# Patient Record
Sex: Male | Born: 1950 | Race: White | Hispanic: No | Smoking: Never smoker
Health system: Southern US, Community
[De-identification: ages and names within clinical notes are randomized; demographics above are authoritative.]

## PROBLEM LIST (undated history)

## (undated) DIAGNOSIS — F329 Major depressive disorder, single episode, unspecified: Secondary | ICD-10-CM

## (undated) DIAGNOSIS — K219 Gastro-esophageal reflux disease without esophagitis: Secondary | ICD-10-CM

## (undated) DIAGNOSIS — G4733 Obstructive sleep apnea (adult) (pediatric): Secondary | ICD-10-CM

## (undated) DIAGNOSIS — E039 Hypothyroidism, unspecified: Secondary | ICD-10-CM

## (undated) DIAGNOSIS — F418 Other specified anxiety disorders: Secondary | ICD-10-CM

## (undated) DIAGNOSIS — N486 Induration penis plastica: Secondary | ICD-10-CM

## (undated) DIAGNOSIS — N312 Flaccid neuropathic bladder, not elsewhere classified: Secondary | ICD-10-CM

## (undated) DIAGNOSIS — Z9989 Dependence on other enabling machines and devices: Secondary | ICD-10-CM

## (undated) DIAGNOSIS — F32A Depression, unspecified: Secondary | ICD-10-CM

## (undated) DIAGNOSIS — M720 Palmar fascial fibromatosis [Dupuytren]: Secondary | ICD-10-CM

## (undated) DIAGNOSIS — N400 Enlarged prostate without lower urinary tract symptoms: Secondary | ICD-10-CM

## (undated) DIAGNOSIS — F419 Anxiety disorder, unspecified: Secondary | ICD-10-CM

## (undated) DIAGNOSIS — M1990 Unspecified osteoarthritis, unspecified site: Secondary | ICD-10-CM

## (undated) DIAGNOSIS — E785 Hyperlipidemia, unspecified: Secondary | ICD-10-CM

## (undated) DIAGNOSIS — T8859XA Other complications of anesthesia, initial encounter: Secondary | ICD-10-CM

## (undated) DIAGNOSIS — I219 Acute myocardial infarction, unspecified: Secondary | ICD-10-CM

## (undated) DIAGNOSIS — N529 Male erectile dysfunction, unspecified: Secondary | ICD-10-CM

## (undated) DIAGNOSIS — I2510 Atherosclerotic heart disease of native coronary artery without angina pectoris: Secondary | ICD-10-CM

## (undated) DIAGNOSIS — E0590 Thyrotoxicosis, unspecified without thyrotoxic crisis or storm: Secondary | ICD-10-CM

## (undated) DIAGNOSIS — L719 Rosacea, unspecified: Secondary | ICD-10-CM

## (undated) DIAGNOSIS — K635 Polyp of colon: Secondary | ICD-10-CM

## (undated) DIAGNOSIS — C4490 Unspecified malignant neoplasm of skin, unspecified: Secondary | ICD-10-CM

## (undated) HISTORY — PX: COLONOSCOPY WITH ESOPHAGOGASTRODUODENOSCOPY (EGD): SHX5779

## (undated) HISTORY — DX: Male erectile dysfunction, unspecified: N52.9

## (undated) HISTORY — DX: Acute myocardial infarction, unspecified: I21.9

## (undated) HISTORY — DX: Atherosclerotic heart disease of native coronary artery without angina pectoris: I25.10

## (undated) HISTORY — DX: Induration penis plastica: N48.6

## (undated) HISTORY — PX: WISDOM TOOTH EXTRACTION: SHX21

## (undated) HISTORY — PX: VASECTOMY: SHX75

## (undated) HISTORY — DX: Palmar fascial fibromatosis (dupuytren): M72.0

## (undated) HISTORY — DX: Benign prostatic hyperplasia without lower urinary tract symptoms: N40.0

## (undated) HISTORY — PX: INNER EAR SURGERY: SHX679

## (undated) HISTORY — DX: Gastro-esophageal reflux disease without esophagitis: K21.9

## (undated) HISTORY — DX: Polyp of colon: K63.5

## (undated) HISTORY — DX: Thyrotoxicosis, unspecified without thyrotoxic crisis or storm: E05.90

## (undated) HISTORY — DX: Flaccid neuropathic bladder, not elsewhere classified: N31.2

## (undated) HISTORY — DX: Dependence on other enabling machines and devices: Z99.89

## (undated) HISTORY — PX: CORONARY STENT PLACEMENT: SHX1402

## (undated) HISTORY — DX: Unspecified osteoarthritis, unspecified site: M19.90

## (undated) HISTORY — PX: PENILE PROSTHESIS IMPLANT: SHX240

## (undated) HISTORY — DX: Unspecified malignant neoplasm of skin, unspecified: C44.90

## (undated) HISTORY — DX: Rosacea, unspecified: L71.9

## (undated) HISTORY — DX: Anxiety disorder, unspecified: F41.9

## (undated) HISTORY — DX: Obstructive sleep apnea (adult) (pediatric): G47.33

## (undated) HISTORY — PX: TONSILECTOMY, ADENOIDECTOMY, BILATERAL MYRINGOTOMY AND TUBES: SHX2538

## (undated) HISTORY — DX: Other specified anxiety disorders: F41.8

## (undated) HISTORY — DX: Depression, unspecified: F32.A

## (undated) HISTORY — DX: Hyperlipidemia, unspecified: E78.5

## (undated) HISTORY — PX: TONSILLECTOMY: SUR1361

## (undated) HISTORY — DX: Major depressive disorder, single episode, unspecified: F32.9

---

## 1998-02-03 HISTORY — PX: LASIK: SHX215

## 1999-11-25 ENCOUNTER — Ambulatory Visit (HOSPITAL_COMMUNITY): Admission: RE | Admit: 1999-11-25 | Discharge: 1999-11-25 | Payer: Self-pay | Admitting: Gastroenterology

## 2002-10-12 ENCOUNTER — Ambulatory Visit (HOSPITAL_COMMUNITY): Admission: RE | Admit: 2002-10-12 | Discharge: 2002-10-12 | Payer: Self-pay | Admitting: Gastroenterology

## 2010-03-06 ENCOUNTER — Ambulatory Visit (HOSPITAL_COMMUNITY): Admission: RE | Admit: 2010-03-06 | Discharge: 2010-03-06 | Payer: Self-pay | Admitting: Orthopedic Surgery

## 2010-10-15 ENCOUNTER — Inpatient Hospital Stay (HOSPITAL_COMMUNITY): Admission: RE | Admit: 2010-10-15 | Discharge: 2010-10-18 | Payer: Self-pay | Admitting: Cardiovascular Disease

## 2010-10-15 ENCOUNTER — Ambulatory Visit: Payer: Self-pay | Admitting: Internal Medicine

## 2010-10-26 DIAGNOSIS — H409 Unspecified glaucoma: Secondary | ICD-10-CM | POA: Insufficient documentation

## 2010-10-26 DIAGNOSIS — F341 Dysthymic disorder: Secondary | ICD-10-CM | POA: Insufficient documentation

## 2010-10-26 DIAGNOSIS — E785 Hyperlipidemia, unspecified: Secondary | ICD-10-CM | POA: Insufficient documentation

## 2010-10-26 DIAGNOSIS — I495 Sick sinus syndrome: Secondary | ICD-10-CM | POA: Insufficient documentation

## 2010-10-26 DIAGNOSIS — I2510 Atherosclerotic heart disease of native coronary artery without angina pectoris: Secondary | ICD-10-CM | POA: Insufficient documentation

## 2010-10-26 DIAGNOSIS — I2119 ST elevation (STEMI) myocardial infarction involving other coronary artery of inferior wall: Secondary | ICD-10-CM | POA: Insufficient documentation

## 2010-10-30 ENCOUNTER — Encounter: Payer: Self-pay | Admitting: Cardiovascular Disease

## 2010-11-03 ENCOUNTER — Encounter: Payer: Self-pay | Admitting: Cardiovascular Disease

## 2010-11-03 ENCOUNTER — Ambulatory Visit: Payer: Self-pay | Admitting: Cardiovascular Disease

## 2010-12-26 ENCOUNTER — Encounter: Payer: Self-pay | Admitting: Emergency Medicine

## 2010-12-31 ENCOUNTER — Telehealth: Payer: Self-pay | Admitting: Cardiovascular Disease

## 2011-01-06 ENCOUNTER — Ambulatory Visit: Admit: 2011-01-06 | Payer: Self-pay

## 2011-01-06 ENCOUNTER — Other Ambulatory Visit: Payer: Self-pay | Admitting: Cardiovascular Disease

## 2011-01-06 ENCOUNTER — Other Ambulatory Visit (INDEPENDENT_AMBULATORY_CARE_PROVIDER_SITE_OTHER): Payer: 59

## 2011-01-06 ENCOUNTER — Encounter: Payer: Self-pay | Admitting: Cardiovascular Disease

## 2011-01-06 ENCOUNTER — Ambulatory Visit (HOSPITAL_COMMUNITY): Payer: 59 | Attending: Cardiovascular Disease

## 2011-01-06 DIAGNOSIS — I252 Old myocardial infarction: Secondary | ICD-10-CM | POA: Insufficient documentation

## 2011-01-06 DIAGNOSIS — E785 Hyperlipidemia, unspecified: Secondary | ICD-10-CM

## 2011-01-06 DIAGNOSIS — I2510 Atherosclerotic heart disease of native coronary artery without angina pectoris: Secondary | ICD-10-CM | POA: Insufficient documentation

## 2011-01-06 DIAGNOSIS — I059 Rheumatic mitral valve disease, unspecified: Secondary | ICD-10-CM | POA: Insufficient documentation

## 2011-01-06 LAB — HEPATIC FUNCTION PANEL
ALT: 28 U/L (ref 0–53)
AST: 32 U/L (ref 0–37)
Alkaline Phosphatase: 70 U/L (ref 39–117)
Bilirubin, Direct: 0.3 mg/dL (ref 0.0–0.3)
Total Bilirubin: 1.1 mg/dL (ref 0.3–1.2)

## 2011-01-06 LAB — LIPID PANEL
LDL Cholesterol: 61 mg/dL (ref 0–99)
Total CHOL/HDL Ratio: 3
Triglycerides: 50 mg/dL (ref 0.0–149.0)

## 2011-01-07 NOTE — Assessment & Plan Note (Signed)
Summary: eph/wpa   Visit Type:  EPH Primary Deysy Schabel:  Merri Brunette  CC:  pt c/o arm and chest pain but not as bad as when he had the MI....states he has back pain now...denies any other complaints today...c/o muscle spasms in his back today....  History of Present Illness: 60 yo WM with history of borderline hyperlipidemia and diagnosis of CAD at time of presentation with acute anterolateral STEMI on 10/15/10. He  underwent emergent catheterization and was found to have an occluded large first obtuse marginal branch of the Circumflex artery. A 2.5 x 15 mm Promus DES was placed in the OM branch and post-dilated with a 2.75 mm non-compliant balloon. He did well following his MI and was discharged home on 10/18/10.   He is here today for follow up. He has been doing well. He describes central chest pain that is mild and only rare. Not similar to pain before MI. His breathing has been ok. He does describe fatigue. Overall he feels great.    Current Medications (verified): 1)  Aspirin Ec 325 Mg Tbec (Aspirin) .... Take One Tablet By Mouth Daily 2)  Lipitor 40 Mg Tabs (Atorvastatin Calcium) .Marland Kitchen.. 1 Tab At Bedtime 3)  Effient 10 Mg Tabs (Prasugrel Hcl) .Marland Kitchen.. 1 Tab Once Daily 4)  Metoprolol Succinate 25 Mg Xr24h-Tab (Metoprolol Succinate) .Marland Kitchen.. 1 Tab Once Daily 5)  Calcium-Vitamin D 500-200 Mg-Unit Tabs (Calcium-Vitamin D) .Marland Kitchen.. 1 Tabs Qam 6)  Effexor Xr 75 Mg Xr24h-Cap (Venlafaxine Hcl) .... 2 Caps Qam...1 Cap At Bedtime 7)  Fish Oil 1200 Mg Caps (Omega-3 Fatty Acids) .Marland Kitchen.. 1 Cap Two Times A Day 8)  Multivitamins   Tabs (Multiple Vitamin) .... 1/2 Tab Once Daily 9)  Timolol Maleate 0.5 % Soln (Timolol Maleate) .Marland Kitchen.. 1 Gtt Ou At Bedtime 10)  Omeprazole 10 Mg Cpdr (Omeprazole) .Marland Kitchen.. 1 Tab Once Daily 11)  Magnesium Oxide 400 Mg Tabs (Magnesium Oxide) .Marland Kitchen.. 1 Tab Once Daily  Allergies (verified): 1)  ! Sulfa 2)  ! * Ivp Dye  Past History:  Past Medical History: CAD s/p inferolateral STEMI 10/15/10  with Promus DES placed x 1 in the first OM HYPERLIPIDEMIA  DEPRESSION/ANXIETY GLAUCOMA  GERD BPH Obstructive sleep apnea-uses CPAP at night  Past Surgical History: Vasectomy Tonsillectomy  Family History: Reviewed history from 10/26/2010 and no changes required. Mother-deceased, smoker, COPD Father: alive, healthy Sister: 2 alive and healthy NO premature CAD  Social History: Reviewed history from 10/26/2010 and no changes required. He has no history of tobacco use.   He has no history of  alcohol.  No illicit drug use Married, 2 children (2 boys) He works as a Horticulturist, commercial  Review of Systems       The patient complains of fatigue and chest pain.  The patient denies malaise, fever, weight gain/loss, vision loss, decreased hearing, hoarseness, palpitations, shortness of breath, prolonged cough, wheezing, sleep apnea, coughing up blood, abdominal pain, blood in stool, nausea, vomiting, diarrhea, heartburn, incontinence, blood in urine, muscle weakness, joint pain, leg swelling, rash, skin lesions, headache, fainting, dizziness, depression, anxiety, enlarged lymph nodes, easy bruising or bleeding, and environmental allergies.    Vital Signs:  Patient profile:   60 year old male Height:      74 inches Weight:      231.50 pounds BMI:     29.83 Pulse rate:   52 / minute Pulse rhythm:   irregular BP sitting:   92 / 70  (left arm) Cuff size:   large  Vitals Entered By: Danielle Rankin, CMA (November 03, 2010 8:34 AM)  Physical Exam  General:  General: Well developed, well nourished, NAD HEENT: OP clear, mucus membranes moist SKIN: warm, dry Neuro: No focal deficits Musculoskeletal: Muscle strength 5/5 all ext Psychiatric: Mood and affect normal Neck: No JVD, no carotid bruits, no thyromegaly, no lymphadenopathy. Lungs:Clear bilaterally, no wheezes, rhonci, crackles CV: RRR no murmurs, gallops rubs Abdomen: soft, NT, ND, BS present Extremities: No edema,  pulses 2+.    Cardiac Cath  Procedure date:  10/15/2010  Findings:       1. The left main coronary artery had no evidence of disease.   2. The left anterior descending was a large vessel that coursed to the       apex and wrapped around the apex.  There was diffuse 40% stenosis       throughout the proximal portion of the LAD.  There was a very small-       caliber diagonal branch that had an ostial 90% stenosis.   3. There was a moderate-sized bifurcating ramus intermediate branch       that had diffuse 40% plaque throughout the proximal portion of the       vessel.   4. Circumflex artery was a large-caliber vessel that gave off a       moderate-sized bifurcating obtuse marginal branch.  The obtuse       marginal branch was totally occluded on our initial injections.       The AV groove circumflex was a moderate-sized vessel with no       obstructive lesions.   5. The right coronary artery was a large dominant vessel with diffuse       40% stenosis throughout the midportion of the vessel.   6. Left ventricular angiogram was performed in the RAO projection and       showed preserved left ventricular systolic function with ejection       fraction of 50-55%.   EKG  Procedure date:  11/03/2010  Findings:      Sinus brady, rate 52 bpm.   Impression & Recommendations:  Problem # 1:  CAD, NATIVE VESSEL (ICD-414.01) Stable post recent STEMI with occluded obtuse marginal branch of the circumflex. Promus DES placed. Will need at least one year of dual antiplatelet therapy with ASA and Effient. Continue statin. His blood pressure is too low to tolerate an Ace-inhibitor. Will decrease Toprol to 12.5 mg by mouth Qdaily. Will check echo in 10 weeks. He will call us if he continues to feel fatigued and we may have to stop his Toprol completely. He can return to full work and exercise.   His updated medication list for this problem includes:    Aspirin Ec 325 Mg Tbec (Aspirin) .Marland Kitchen... Take  one tablet by mouth daily    Effient 10 Mg Tabs (Prasugrel hcl) .Marland Kitchen... 1 tab once daily    Metoprolol Succinate 25 Mg Xr24h-tab (Metoprolol succinate) .Marland Kitchen... 1/2 tablet by mouth daily.    Nitrostat 0.4 Mg Subl (Nitroglycerin) .Marland Kitchen... 1 tablet under tongue at onset of chest pain; you may repeat every 5 minutes for up to 3 doses.  Orders: EKG w/ Interpretation (93000) Echocardiogram (Echo)  Problem # 2:  MYOCARDIAL INFARCTION, ACUTE, INFEROLATERAL (ICD-410.20) See above.   His updated medication list for this problem includes:    Aspirin Ec 325 Mg Tbec (Aspirin) .Marland Kitchen... Take one tablet by mouth daily    Effient 10 Mg Tabs (Prasugrel hcl) .Marland KitchenMarland KitchenMarland KitchenMarland Kitchen  1 tab once daily    Metoprolol Succinate 25 Mg Xr24h-tab (Metoprolol succinate) .Marland Kitchen... 1/2 tablet by mouth daily.    Nitrostat 0.4 Mg Subl (Nitroglycerin) .Marland Kitchen... 1 tablet under tongue at onset of chest pain; you may repeat every 5 minutes for up to 3 doses.  Orders: Echocardiogram (Echo)  Problem # 3:  HYPERLIPIDEMIA (ICD-272.4) Continue statin. Will check fasting lipids and LFTs in 10 weeks.   His updated medication list for this problem includes:    Lipitor 40 Mg Tabs (Atorvastatin calcium) .Marland Kitchen... 1 tab at bedtime  Patient Instructions: 1)  Your physician recommends that you schedule a follow-up appointment in: 6 months 2)  Your physician has recommended you make the following change in your medication: DECREASE TOPROL to 12.5mg  by mouth daily. USE NITROGLYCERINE under your tongue as needed for chest pain. 3)  Your physician has requested that you have an echocardiogram.  Echocardiography is a painless test that uses sound waves to create images of your heart. It provides your doctor with information about the size and shape of your heart and how well your heart's chambers and valves are working.  This procedure takes approximately one hour. There are no restrictions for this procedure. 4)  Your physician recommends that you return for a FASTING lipid  profile and liver function test in 12 weeks.  Prescriptions: NITROSTAT 0.4 MG SUBL (NITROGLYCERIN) 1 tablet under tongue at onset of chest pain; you may repeat every 5 minutes for up to 3 doses.  #25 x 3   Entered by:   Whitney Maeola Sarah RN   Authorized by:   Verne Carrow, MD   Signed by:   Ellender Hose RN on 11/03/2010   Method used:   Electronically to        [id number] (retail)       [address]       [place], [place]  [address]       Ph: [phone]       Fax: [phone]   RxID:   [id number] METOPROLOL SUCCINATE 25 MG XR24H-TAB (METOPROLOL SUCCINATE) 1/2 tablet by mouth daily.  #30 x 8   Entered by:   Whitney Maeola Sarah RN   Authorized by:   Verne Carrow, MD   Signed by:   Ellender Hose RN on 11/03/2010   Method used:   Electronically to        [id number] (retail)       [address]       [place], [place]  [address]       Ph: [phone]       Fax: [phone]   RxID:   [id number]

## 2011-01-07 NOTE — Progress Notes (Signed)
Summary: Dietary Supplments/ Med List  Dietary Supplments/ Med List   Imported By: Earl Many 11/06/2010 16:30:16  _____________________________________________________________________  External Attachment:    Type:   Image     Comment:   External Document

## 2011-01-07 NOTE — Progress Notes (Signed)
Summary: pt's metoprolol naking him feel bad/ LMOM for call back.  Phone Note Call from Patient   Caller: Patient [phone] Reason for Call: Talk to Nurse Summary of Call: metoprolol making him feel bad-pls call Initial call taken by: Glynda Jaeger,  December 31, 2010 1:33 PM  Follow-up for Phone Call        [facility] for call back.  Layne Benton, RN, BSN  December 31, 2010 2:21 PM   Additional Follow-up for Phone Call Additional follow up Details #1::        Patient called back. He can't tolerate the 12.5mg  of metoprolol because of profound weakness. He will cut this dose in half for a few days and then stop. He has follow up with Dr.CM next week. Will let him know.  Layne Benton, RN, BSN  December 31, 2010 3:25 PM

## 2011-01-13 NOTE — Miscellaneous (Signed)
Summary: Orders Update  Clinical Lists Changes  Orders: Added new Test order of TLB-Hepatic/Liver Function Pnl (80076-HEPATIC) - Signed Added new Test order of TLB-Lipid Panel (80061-LIPID) - Signed 

## 2011-02-16 LAB — PROTIME-INR: INR: 1.01 (ref 0.00–1.49)

## 2011-02-16 LAB — CBC
Hemoglobin: 13.7 g/dL (ref 13.0–17.0)
MCH: 34 pg (ref 26.0–34.0)
MCV: 98.2 fL (ref 78.0–100.0)
Platelets: 149 10*3/uL — ABNORMAL LOW (ref 150–400)
Platelets: 163 10*3/uL (ref 150–400)
RBC: 3.85 MIL/uL — ABNORMAL LOW (ref 4.22–5.81)
RBC: 4.18 MIL/uL — ABNORMAL LOW (ref 4.22–5.81)

## 2011-02-16 LAB — POCT I-STAT, CHEM 8
BUN: 19 mg/dL (ref 6–23)
Calcium, Ion: 0.99 mmol/L — ABNORMAL LOW (ref 1.12–1.32)
Chloride: 105 mEq/L (ref 96–112)
Creatinine, Ser: 1.3 mg/dL (ref 0.4–1.5)
Glucose, Bld: 143 mg/dL — ABNORMAL HIGH (ref 70–99)
HCT: 48 % (ref 39.0–52.0)
Hemoglobin: 16.3 g/dL (ref 13.0–17.0)
Potassium: 4 mEq/L (ref 3.5–5.1)
Sodium: 138 meq/L (ref 135–145)
TCO2: 26 mmol/L (ref 0–100)

## 2011-02-16 LAB — BASIC METABOLIC PANEL
Calcium: 8.9 mg/dL (ref 8.4–10.5)
GFR calc Af Amer: >60 mL/min (ref >60)
GFR calc non Af Amer: 59 mL/min — ABNORMAL LOW (ref >60)
Sodium: 141 mEq/L (ref 135–145)

## 2011-02-16 LAB — COMPREHENSIVE METABOLIC PANEL
ALT: 41 U/L (ref 0–53)
AST: 43 U/L — ABNORMAL HIGH (ref 0–37)
AST: 163 U/L — ABNORMAL HIGH (ref 0–37)
Albumin: 4.1 g/dL (ref 3.5–5.2)
Alkaline Phosphatase: 78 U/L (ref 39–117)
Alkaline Phosphatase: 59 U/L (ref 39–117)
BUN: 19 mg/dL (ref 6–23)
CO2: 28 mEq/L (ref 19–32)
Chloride: 105 mEq/L (ref 96–112)
Creatinine, Ser: 1.07 mg/dL (ref 0.4–1.5)
GFR calc Af Amer: >60 mL/min (ref >60)
GFR calc Af Amer: >60 mL/min (ref >60)
GFR calc non Af Amer: >60 mL/min (ref >60)
Potassium: 4 mEq/L (ref 3.5–5.1)
Sodium: 138 mEq/L (ref 135–145)
Total Bilirubin: 0.9 mg/dL (ref 0.3–1.2)
Total Protein: 7.4 g/dL (ref 6.0–8.3)

## 2011-02-16 LAB — LIPID PANEL
Cholesterol: 172 mg/dL (ref 0–200)
Total CHOL/HDL Ratio: 4.4 RATIO
VLDL: 22 mg/dL (ref 0–40)

## 2011-02-16 LAB — POCT CARDIAC MARKERS
CKMB, poc: 25.6 ng/mL (ref 1.0–8.0)
Myoglobin, poc: >500 ng/mL (ref 12–200)
Troponin i, poc: 0.53 ng/mL (ref 0.00–0.09)

## 2011-02-16 LAB — TROPONIN I: Troponin I: 69.21 ng/mL (ref 0.00–0.06)

## 2011-04-23 NOTE — Op Note (Signed)
   NAME:  Matthew Pratt, Matthew Pratt                          ACCOUNT NO.:  [id number]   MEDICAL RECORD NO.:  [id number]                   PATIENT TYPE:  AMB   LOCATION:  ENDO                                 FACILITY:  [facility]   PHYSICIAN:  John C. Madilyn Fireman, M.D.                 DATE OF BIRTH:  09-05-1951   DATE OF PROCEDURE:  10/12/2002  DATE OF DISCHARGE:                                 OPERATIVE REPORT   PROCEDURE:  Colonoscopy.   INDICATIONS FOR PROCEDURE:  Family history of colon cancer in two second  degree relatives.   DESCRIPTION OF PROCEDURE:  The patient was placed in the left lateral  decubitus position then placed on the pulse monitor with continuous low flow  oxygen delivered by nasal cannula. He was sedated with 50 mg IV Demerol and  6 mg IV Versed. The Olympus video colonoscope was inserted into the rectum  and advanced to the cecum, confirmed by transillumination at McBurney's  point and visualization of the ileocecal valve and appendiceal orifice. The  prep was excellent. The cecum, ascending, transverse, descending and sigmoid  colon all appeared normal with no masses, polyps, diverticula or other  mucosal abnormalities. The rectum likewise appeared normal and retroflexed  view of the anus revealed no obvious internal hemorrhoids. The colonoscope  was then withdrawn and the patient returned to the recovery room in stable  condition. The patient tolerated the procedure well and there were no  immediate complications.   IMPRESSION:  Normal colonoscopy.   PLAN:  Will repeat colonoscopy in five years.                                                John C. Madilyn Fireman, M.D.    [facility]/MEDQ  D:  10/12/2002  T:  10/12/2002  Job:  [id number]   cc:   Heather Roberts, M.D.  [address]  [place]  [place] [address]  Fax: [phone]

## 2011-04-28 ENCOUNTER — Encounter: Payer: Self-pay | Admitting: Cardiovascular Disease

## 2011-04-29 ENCOUNTER — Encounter: Payer: Self-pay | Admitting: Cardiovascular Disease

## 2011-04-29 ENCOUNTER — Ambulatory Visit (INDEPENDENT_AMBULATORY_CARE_PROVIDER_SITE_OTHER): Payer: 59 | Admitting: Cardiovascular Disease

## 2011-04-29 VITALS — BP 110/72 | HR 68 | Resp 17 | Ht 74.0 in | Wt 210.0 lb

## 2011-04-29 DIAGNOSIS — I2510 Atherosclerotic heart disease of native coronary artery without angina pectoris: Secondary | ICD-10-CM

## 2011-04-29 MED ORDER — ASPIRIN 81 MG PO TABS: 81.0000 mg | ORAL_TABLET | Freq: Every day | ORAL | Status: DC

## 2011-04-29 NOTE — Progress Notes (Signed)
Addended by: Floreen Comber on: 04/29/2011 01:46 PM   Modules accepted: Orders

## 2011-04-29 NOTE — Progress Notes (Signed)
History of Present Illness:60 yo WM with history of borderline hyperlipidemia and diagnosis of CAD at time of presentation with acute anterolateral STEMI on 10/15/10. He  underwent emergent catheterization and was found to have an occluded large first obtuse marginal branch of the Circumflex artery. A 2.5 x 15 mm Promus DES was placed in the OM branch and post-dilated with a 2.75 mm non-compliant balloon. He did well following his MI and was discharged home on 10/18/10. I last saw him in November.   He is here today for follow up. He tells me that he has done well for the last six months. Yesterday he had an episode of central chest pressure at rest, tightness, he took a NTG and this lasted for 20 minutes. After a meal. No associated SOB, diaphoresis, N/V. He has been exercising every day with no CP or SOB.   Past Medical History  Diagnosis Date  . CAD (coronary artery disease)     s/p STEMI 10/15/10 w/ Promus DES placed x1 in the first OM  . HLD (hyperlipidemia)   . Depression with anxiety   . Glaucoma   . GERD (gastroesophageal reflux disease)   . BPH (benign prostatic hypertrophy)   . OSA on CPAP     Past Surgical History  Procedure Date  . Vasectomy   . Tonsillectomy     Current Outpatient Prescriptions  Medication Sig Dispense Refill  . aspirin 325 MG tablet Take 325 mg by mouth daily.        Marland Kitchen atorvastatin (LIPITOR) 40 MG tablet Take 40 mg by mouth daily.        . Bimatoprost (LUMIGAN) 0.01 % SOLN Apply 1 drop to eye daily.        . Calcium Carbonate-Vitamin D (CALCIUM-VITAMIN D) 500-200 MG-UNIT per tablet Take 1 tablet by mouth daily.        . magnesium oxide (MAG-OX) 400 MG tablet Take 400 mg by mouth daily.        . Misc Natural Products (FIBER 7 PO) Take by mouth.        . Multiple Vitamin (MULTIVITAMIN) tablet Take 1 tablet by mouth daily.        . nitroGLYCERIN (NITROSTAT) 0.4 MG SL tablet Place 0.4 mg under the tongue every 5 (five) minutes as needed.        . Omega-3  Fatty Acids (FISH OIL) 1200 MG CAPS Take by mouth 2 (two) times daily.        . prasugrel (EFFIENT) 10 MG TABS Take 10 mg by mouth daily.        Marland Kitchen venlafaxine (EFFEXOR-XR) 75 MG 24 hr capsule Take 75 mg by mouth daily.        Marland Kitchen DISCONTD: omeprazole (PRILOSEC) 10 MG capsule Take 10 mg by mouth daily.        Marland Kitchen DISCONTD: timolol (BETIMOL) 0.5 % ophthalmic solution Place 1 drop into both eyes at bedtime.          Allergies  Allergen Reactions  . Sulfonamide Derivatives     REACTION: rash    History   Social History  . Marital Status: Married    Spouse Name: N/A    Number of Children: N/A  . Years of Education: N/A   Occupational History  . technical specialist -  engineer    Social History Main Topics  . Smoking status: Never Smoker   . Smokeless tobacco: Not on file  . Alcohol Use: No  . Drug Use: No  .  Sexually Active: Not on file   Other Topics Concern  . Not on file   Social History Narrative  . No narrative on file    Family History  Problem Relation Age of Onset  . COPD      Review of Systems:  As stated in the HPI and otherwise negative.   BP 110/72  Pulse 68  Resp 17  Ht 6\' 2"  (1.88 m)  Wt 210 lb (95.255 kg)  BMI 26.96 kg/m2  Physical Examination: General: Well developed, well nourished, NAD HEENT: OP clear, mucus membranes moist SKIN: warm, dry. No rashes. Neuro: No focal deficits Musculoskeletal: Muscle strength 5/5 all ext Psychiatric: Mood and affect normal Neck: No JVD, no carotid bruits, no thyromegaly, no lymphadenopathy. Lungs:Clear bilaterally, no wheezes, rhonci, crackles Cardiovascular: Regular rate and rhythm. No murmurs, gallops or rubs. Abdomen:Soft. Bowel sounds present. Non-tender.  Extremities: No lower extremity edema. Pulses are 2 + in the bilateral DP/PT.  EKG: Sinus brady, rate 59 bpm.

## 2011-04-29 NOTE — Assessment & Plan Note (Signed)
Stable. I do not think his episode of chest pain is cardiac related. Reduce ASA to 81 mg po Qdaily. Continue Effient 10 mg po Qdaily. Continue beta blocker. He will hold statin for one month to see if it helps his constipation. If no help, will resume.

## 2011-09-03 ENCOUNTER — Other Ambulatory Visit: Payer: Self-pay | Admitting: *Deleted

## 2011-09-03 DIAGNOSIS — I2510 Atherosclerotic heart disease of native coronary artery without angina pectoris: Secondary | ICD-10-CM

## 2011-09-03 MED ORDER — ATORVASTATIN CALCIUM 40 MG PO TABS: 40.0000 mg | ORAL_TABLET | Freq: Every day | ORAL | Status: DC

## 2011-09-13 ENCOUNTER — Telehealth: Payer: Self-pay | Admitting: Cardiovascular Disease
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
## 2011-10-08 ENCOUNTER — Other Ambulatory Visit: Payer: Self-pay | Admitting: *Deleted

## 2011-10-08 MED ORDER — PRASUGREL HCL 10 MG PO TABS: 10.0000 mg | ORAL_TABLET | Freq: Every day | ORAL | Status: DC

## 2011-10-27 ENCOUNTER — Ambulatory Visit (INDEPENDENT_AMBULATORY_CARE_PROVIDER_SITE_OTHER): Payer: 59 | Admitting: Cardiovascular Disease

## 2011-10-27 ENCOUNTER — Encounter: Payer: Self-pay | Admitting: Cardiovascular Disease

## 2011-10-27 VITALS — BP 100/60 | HR 60 | Ht 74.0 in | Wt 208.0 lb

## 2011-10-27 DIAGNOSIS — I2510 Atherosclerotic heart disease of native coronary artery without angina pectoris: Secondary | ICD-10-CM

## 2011-10-27 MED ORDER — ATORVASTATIN CALCIUM 40 MG PO TABS: 40.0000 mg | ORAL_TABLET | Freq: Every day | ORAL | Status: DC

## 2011-10-27 MED ORDER — CLOPIDOGREL BISULFATE 75 MG PO TABS: 75.0000 mg | ORAL_TABLET | Freq: Every day | ORAL | Status: DC

## 2011-10-27 MED ORDER — ASPIRIN EC 81 MG PO TBEC: 81.0000 mg | DELAYED_RELEASE_TABLET | Freq: Every day | ORAL | Status: AC

## 2011-10-27 NOTE — Progress Notes (Signed)
History of Present Illness: 60 yo WM with history of borderline hyperlipidemia and diagnosis of CAD at time of presentation with acute anterolateral STEMI on 10/15/10. He underwent emergent catheterization and was found to have an occluded large first obtuse marginal branch of the Circumflex artery. A 2.5 x 15 mm Promus DES was placed in the OM branch and post-dilated with a 2.75 mm non-compliant balloon. He did well following his MI and was discharged home on 10/18/10. I last saw him in May 2012.   He is here today for follow up. He tells me that he has done well for the last six months. He has been exercising every morning by walking two miles with no CP or SOB. Lipids in February 2012 with total cholesterol 110, LDL 61, HDL 39. LFTs normal 2/12.    Past Medical History  Diagnosis Date  . CAD (coronary artery disease)     s/p STEMI 10/15/10 w/ Promus DES placed x1 in the first OM  . HLD (hyperlipidemia)   . Depression with anxiety   . Glaucoma   . GERD (gastroesophageal reflux disease)   . BPH (benign prostatic hypertrophy)   . OSA on CPAP     Past Surgical History  Procedure Date  . Vasectomy   . Tonsillectomy     Current Outpatient Prescriptions  Medication Sig Dispense Refill  . atorvastatin (LIPITOR) 40 MG tablet Take 1 tablet (40 mg total) by mouth daily.  30 tablet  6  . Calcium Carbonate-Vitamin D (CALCIUM-VITAMIN D) 500-200 MG-UNIT per tablet Take 1 tablet by mouth daily.        . Misc Natural Products (FIBER 7 PO) Take by mouth.        . Multiple Vitamin (MULTIVITAMIN) tablet Take 1 tablet by mouth daily.        . nitroGLYCERIN (NITROSTAT) 0.4 MG SL tablet Place 0.4 mg under the tongue every 5 (five) minutes as needed.        . Omega-3 Fatty Acids (FISH OIL) 1200 MG CAPS Take by mouth 2 (two) times daily.        . prasugrel (EFFIENT) 10 MG TABS Take 1 tablet (10 mg total) by mouth daily.  30 tablet  1  . timolol (TIMOPTIC) 0.5 % ophthalmic solution 1 drop 1 day or 1 dose.         . venlafaxine (EFFEXOR-XR) 75 MG 24 hr capsule Take 150 mg by mouth 2 (two) times daily.         Allergies  Allergen Reactions  . Sulfonamide Derivatives     REACTION: rash    History   Social History  . Marital Status: Married    Spouse Name: N/A    Number of Children: N/A  . Years of Education: N/A   Occupational History  . technical specialist -  engineer    Social History Main Topics  . Smoking status: Never Smoker   . Smokeless tobacco: Not on file  . Alcohol Use: No  . Drug Use: No  . Sexually Active: Not on file   Other Topics Concern  . Not on file   Social History Narrative  . No narrative on file    Family History  Problem Relation Age of Onset  . COPD      Review of Systems:  As stated in the HPI and otherwise negative.   BP 100/60  Pulse 60  Ht 6\' 2"  (1.88 m)  Wt 208 lb (94.348 kg)  BMI 26.71 kg/m2  Physical Examination: General: Well developed, well nourished, NAD HEENT: OP clear, mucus membranes moist SKIN: warm, dry. No rashes. Neuro: No focal deficits Musculoskeletal: Muscle strength 5/5 all ext Psychiatric: Mood and affect normal Neck: No JVD, no carotid bruits, no thyromegaly, no lymphadenopathy. Lungs:Clear bilaterally, no wheezes, rhonci, crackles Cardiovascular: Regular rate and rhythm. No murmurs, gallops or rubs. Abdomen:Soft. Bowel sounds present. Non-tender.  Extremities: No lower extremity edema. Pulses are 2 + in the bilateral DP/PT.

## 2011-10-27 NOTE — Assessment & Plan Note (Addendum)
Stable. He stopped his ASA. He has been on Effient. He is now one year post DES and STEMI. Will change to Plavix 75 mg po Qdaily and stop Effient. He will restart ASA 81 mg po Qdaily. Continue statin. Repeat lipids and LFTs in 6 months. He is not on a beta blocker secondary to bradycardia.

## 2011-10-27 NOTE — Patient Instructions (Signed)
Your physician wants you to follow-up in: 6 months.  You will receive a reminder letter in the mail two months in advance. If you don't receive a letter, please call our office to schedule the follow-up appointment.  Your physician has recommended you make the following change in your medication:  Stop Effient.  Start Clopidogrel 75 mg by mouth daily. Start aspirin 81 mg by mouth daily   Your physician recommends that you return for fasting  lab work in 6 months on day of appt with Dr. Larene Beach and Liver profile.

## 2012-04-25 ENCOUNTER — Ambulatory Visit (INDEPENDENT_AMBULATORY_CARE_PROVIDER_SITE_OTHER): Payer: 59 | Admitting: Cardiovascular Disease

## 2012-04-25 ENCOUNTER — Encounter: Payer: Self-pay | Admitting: Cardiovascular Disease

## 2012-04-25 VITALS — BP 104/80 | HR 57 | Ht 74.0 in | Wt 215.1 lb

## 2012-04-25 DIAGNOSIS — I2510 Atherosclerotic heart disease of native coronary artery without angina pectoris: Secondary | ICD-10-CM

## 2012-04-25 MED ORDER — ATORVASTATIN CALCIUM 10 MG PO TABS: 10.0000 mg | ORAL_TABLET | Freq: Every day | ORAL | Status: DC

## 2012-04-25 NOTE — Patient Instructions (Signed)
Your physician wants you to follow-up in: 12 months.  You will receive a reminder letter in the mail two months in advance. If you don't receive a letter, please call our office to schedule the follow-up appointment.  Your physician has recommended you make the following change in your medication:  Stop Plavix   

## 2012-04-25 NOTE — Assessment & Plan Note (Signed)
Stable. Will continue ASA. Will stop Plavix. Continue Lipitor 10 mg po QHS. He is doing well.

## 2012-04-25 NOTE — Progress Notes (Signed)
History of Present Illness: 61 yo Pratt with history of HLD and CAD who is here today for cardiac follow up. He presented with an acute anterolateral STEMI on 10/15/10. He underwent emergent catheterization and was found to have an occluded large first obtuse marginal branch of the Circumflex artery. A 2.5 x 15 mm Promus DES was placed in the OM branch and post-dilated with a 2.75 mm non-compliant balloon. He did well following his MI and was discharged home on 10/18/10. I last saw him in November 2012.   He is here today for follow up. He tells me that he has done well for the last six months. He has been exercising every morning by walking two miles with no CP or SOB. No dizziness, near syncope or syncope.    Primary Care Physician: Merri Brunette  Last Lipid Profile:  Followed by Dr. Renne Crigler. Well controlled per pt.  Lipid Panel     Component Value Date/Time   CHOL 110 01/06/2011 0900   TRIG 50.0 01/06/2011 0900   HDL 38.90* 01/06/2011 0900   CHOLHDL 3 01/06/2011 0900   VLDL 10.0 01/06/2011 0900   LDLCALC 61 01/06/2011 0900     Past Medical History  Diagnosis Date  . CAD (coronary artery disease)     s/p STEMI 10/15/10 w/ Promus DES placed x1 in the first OM  . HLD (hyperlipidemia)   . Depression with anxiety   . Glaucoma   . GERD (gastroesophageal reflux disease)   . BPH (benign prostatic hypertrophy)   . OSA on CPAP     Past Surgical History  Procedure Date  . Vasectomy   . Tonsillectomy     Current Outpatient Prescriptions  Medication Sig Dispense Refill  . aspirin EC 81 MG tablet Take 1 tablet (81 mg total) by mouth daily.  150 tablet  2  . atorvastatin (LIPITOR) 40 MG tablet Take 1 tablet (40 mg total) by mouth daily.  90 tablet  3  . Calcium Carbonate-Vitamin D (CALCIUM-VITAMIN D) 500-200 MG-UNIT per tablet Take 1 tablet by mouth daily.        . clopidogrel (PLAVIX) 75 MG tablet Take 1 tablet (75 mg total) by mouth daily.  90 tablet  3  . Lutein 20 MG TABS Take 20 mg by mouth  daily.      . Misc Natural Products (FIBER 7 PO) Take by mouth.        . Multiple Vitamin (MULTIVITAMIN) tablet Take 1 tablet by mouth daily.       . nitroGLYCERIN (NITROSTAT) 0.4 MG SL tablet Place 0.4 mg under the tongue every 5 (five) minutes as needed.        . Omega-3 Fatty Acids (FISH OIL) 1200 MG CAPS Take by mouth 2 (two) times daily.        . timolol (TIMOPTIC) 0.5 % ophthalmic solution 1 drop 1 day or 1 dose.        . venlafaxine (EFFEXOR-XR) 75 MG 24 hr capsule Take 225 mg by mouth 2 (two) times daily.         Allergies  Allergen Reactions  . Sulfonamide Derivatives     REACTION: rash    History   Social History  . Marital Status: Married    Spouse Name: N/A    Number of Children: N/A  . Years of Education: N/A   Occupational History  . technical specialist -  engineer    Social History Main Topics  . Smoking status: Never Smoker   .  Smokeless tobacco: Not on file  . Alcohol Use: No  . Drug Use: No  . Sexually Active: Not on file   Other Topics Concern  . Not on file   Social History Narrative  . No narrative on file    Family History  Problem Relation Age of Onset  . COPD      Review of Systems:  As stated in the HPI and otherwise negative.   BP 104/80  Pulse 57  Ht 6\' 2"  (1.88 m)  Wt 215 lb 1.9 oz (97.578 kg)  BMI 27.62 kg/m2  Physical Examination: General: Well developed, well nourished, NAD HEENT: OP clear, mucus membranes moist SKIN: warm, dry. No rashes. Neuro: No focal deficits Musculoskeletal: Muscle strength 5/5 all ext Psychiatric: Mood and affect normal Neck: No JVD, no carotid bruits, no thyromegaly, no lymphadenopathy. Lungs:Clear bilaterally, no wheezes, rhonci, crackles Cardiovascular: Brady, No murmurs, gallops or rubs. Abdomen:Soft. Bowel sounds present. Non-tender.  Extremities: No lower extremity edema. Pulses are 2 + in the bilateral DP/PT.  EKG: Sinus brady, rate 57 bpm.

## 2014-11-14 DIAGNOSIS — N486 Induration penis plastica: Secondary | ICD-10-CM | POA: Insufficient documentation

## 2015-03-26 DIAGNOSIS — N5201 Erectile dysfunction due to arterial insufficiency: Secondary | ICD-10-CM | POA: Insufficient documentation

## 2015-09-15 DIAGNOSIS — N312 Flaccid neuropathic bladder, not elsewhere classified: Secondary | ICD-10-CM | POA: Insufficient documentation

## 2015-12-26 DIAGNOSIS — R0789 Other chest pain: Secondary | ICD-10-CM | POA: Insufficient documentation

## 2016-07-22 DIAGNOSIS — F331 Major depressive disorder, recurrent, moderate: Secondary | ICD-10-CM | POA: Insufficient documentation

## 2016-10-15 DIAGNOSIS — R12 Heartburn: Secondary | ICD-10-CM | POA: Insufficient documentation

## 2016-12-03 ENCOUNTER — Ambulatory Visit (INDEPENDENT_AMBULATORY_CARE_PROVIDER_SITE_OTHER): Payer: BLUE CROSS/BLUE SHIELD | Admitting: Pulmonary Disease

## 2016-12-03 ENCOUNTER — Encounter: Payer: Self-pay | Admitting: Pulmonary Disease

## 2016-12-03 VITALS — BP 110/78 | HR 56 | Ht 74.0 in | Wt 213.6 lb

## 2016-12-03 DIAGNOSIS — G4730 Sleep apnea, unspecified: Secondary | ICD-10-CM | POA: Diagnosis not present

## 2016-12-03 DIAGNOSIS — F339 Major depressive disorder, recurrent, unspecified: Secondary | ICD-10-CM | POA: Insufficient documentation

## 2016-12-03 DIAGNOSIS — F329 Major depressive disorder, single episode, unspecified: Secondary | ICD-10-CM

## 2016-12-03 DIAGNOSIS — G4733 Obstructive sleep apnea (adult) (pediatric): Secondary | ICD-10-CM

## 2016-12-03 DIAGNOSIS — F32A Depression, unspecified: Secondary | ICD-10-CM | POA: Insufficient documentation

## 2016-12-03 NOTE — Assessment & Plan Note (Signed)
Patient was diagnosed with OSA at least 5-6 yrs ago.   He had a split night study, unsure severity. He was on cpap and he had humidification issues which prevented him from using cpap the last 6 months.   Patient has hypersomnia, snoring, gasping, choking, unrefreshed sleep. Also with fatigue. These are better with CPAP therapy but he had stopped CPAP 6-7 months ago because of humidifier issues. Since being off CPAP, symptoms have gotten worse. He had a study 2 years ago to determine optimal pressure  Plan :  We discussed about the diagnosis of Obstructive Sleep Apnea (OSA) and implications of untreated OSA. We discussed about CPAP and BiPaP as possible treatment options.    We will schedule the patient for a sleep study. Plan for a split-night sleep study. He lives and [place]. Might have logistics issues. Lab study better than a home study. Anticipate will not have issues with auto CPAP. Had humidifier issues before.   Patient was instructed to call the office if he/she has not heard back from the office 1-2 weeks after the sleep study.   Patient was instructed to call the office if he/she is having issues with the PAP device.   We discussed good sleep hygiene.   Patient was advised not to engage in activities requiring concentration and/or vigilance if he/she is sleepy.  Patient was advised not to drive if he/she is sleepy.

## 2016-12-03 NOTE — Progress Notes (Signed)
Subjective:    Patient ID: Matthew Pratt, male    DOB: 1951/09/06, 65 y.o.   MRN: [id number]  HPI   This is the case of Matthew Pratt, 65 y.o. Male, who was referred by Dr. Lovette Cliche in consultation regarding OSA.    Patient is a nonsmoker, denies history of asthma or COPD.  As you very well know, patient was diagnosed with OSA at least 5-6 yrs ago.   He had a split night study, unsure severity. He was on cpap and he had humidification issues which prevented him from using cpap the last 6 months.   Patient has hypersomnia, snoring, gasping, choking, unrefreshed sleep. Also with fatigue. These are better with CPAP therapy but he had stopped CPAP 6-7 months ago because of humidifier issues. Since being off CPAP, symptoms have gotten worse. He had a study 2 years ago to determine optimal pressure.  Pt was on Effexor which has been working for 10 yrs.  Effexor was switched to Duloxetine recently 2/2 insurance issues.  Depression was better controlled with Effexor. Recent depression and fatigue and difficulty falling and staying asleep.  Patient denies abnormal behavior and sleep.     Review of Systems  Constitutional: Negative.  Negative for fever and unexpected weight change.  HENT: Negative.  Negative for congestion, dental problem, ear pain, nosebleeds, postnasal drip, rhinorrhea, sinus pressure, sneezing, sore throat and trouble swallowing.   Eyes: Negative.  Negative for redness and itching.  Respiratory: Negative.  Negative for cough, chest tightness, shortness of breath and wheezing.   Cardiovascular: Negative.  Negative for palpitations and leg swelling.  Gastrointestinal: Negative.  Negative for nausea and vomiting.  Endocrine: Negative.   Genitourinary: Negative.  Negative for dysuria.  Musculoskeletal: Negative.  Negative for joint swelling.  Skin: Negative.  Negative for rash.  Allergic/Immunologic: Negative.   Neurological: Negative.  Negative for headaches.    Hematological: Bruises/bleeds easily.  Psychiatric/Behavioral: Negative.  Negative for dysphoric mood. The patient is not nervous/anxious.    Past Medical History:  Diagnosis Date  . BPH (benign prostatic hypertrophy)   . CAD (coronary artery disease)    s/p STEMI 10/15/10 w/ Promus DES placed x1 in the first OM  . Depression with anxiety   . GERD (gastroesophageal reflux disease)   . Glaucoma   . HLD (hyperlipidemia)   . OSA on CPAP    (-) CA, DVT  Family History  Problem Relation Age of Onset  . COPD       Past Surgical History:  Procedure Laterality Date  . TONSILLECTOMY    . VASECTOMY      Social History   Social History  . Marital status: Married    Spouse name: N/A  . Number of children: N/A  . Years of education: N/A   Occupational History  . technical specialist -  engineer    Social History Main Topics  . Smoking status: Never Smoker  . Smokeless tobacco: Not on file  . Alcohol use No  . Drug use: No  . Sexual activity: Not on file   Other Topics Concern  . Not on file   Social History Narrative  . No narrative on file   Lives in [place]. Is an Chief Financial Officer.    Allergies  Allergen Reactions  . Iodinated Diagnostic Agents Hives  . Sulfonamide Derivatives     REACTION: rash     Outpatient Medications Prior to Visit  Medication Sig Dispense Refill  . Calcium Carbonate-Vitamin D (CALCIUM-VITAMIN  D) 500-200 MG-UNIT per tablet Take 1 tablet by mouth daily.      . Misc Natural Products (FIBER 7 PO) Take by mouth.      . Multiple Vitamin (MULTIVITAMIN) tablet Take 1 tablet by mouth daily.     . nitroGLYCERIN (NITROSTAT) 0.4 MG SL tablet Place 0.4 mg under the tongue every 5 (five) minutes as needed.      . timolol (TIMOPTIC) 0.5 % ophthalmic solution 1 drop 1 day or 1 dose.      Marland Kitchen atorvastatin (LIPITOR) 10 MG tablet Take 1 tablet (10 mg total) by mouth daily. (Patient not taking: Reported on 12/03/2016) 90 tablet 3  . Lutein 20 MG TABS Take 20 mg by  mouth daily.    . Omega-3 Fatty Acids (FISH OIL) 1200 MG CAPS Take by mouth 2 (two) times daily.      Marland Kitchen venlafaxine (EFFEXOR-XR) 75 MG 24 hr capsule Take 225 mg by mouth 2 (two) times daily.      No facility-administered medications prior to visit.    Meds ordered this encounter  Medications  . pravastatin (PRAVACHOL) 40 MG tablet    Sig: TAKE 1 TABLET BY MOUTH AT BEDTIME  . DULoxetine (CYMBALTA) 60 MG capsule    Sig: TAKE 2 CAPSULES BY MOUTH EVERY MORNING  . aspirin EC 81 MG tablet    Sig: Take by mouth.  . levothyroxine (SYNTHROID, LEVOTHROID) 137 MCG tablet    Sig: Take one tab po daily. BRAND NAME ONLY.  DAW/1        Objective:   Physical Exam Vitals:  Vitals:   12/03/16 1143  BP: 110/78  Pulse: (!) 56  SpO2: 97%  Weight: 213 lb 9.6 oz (96.9 kg)  Height: 6\' 2"  (1.88 m)    Constitutional/General:  Pleasant, well-nourished, well-developed, not in any distress,  Comfortably seating.  Well kempt  Body mass index is 27.42 kg/m. Wt Readings from Last 3 Encounters:  12/03/16 213 lb 9.6 oz (96.9 kg)  04/25/12 215 lb 1.9 oz (97.6 kg)  10/27/11 208 lb (94.3 kg)     HEENT: Pupils equal and reactive to light and accommodation. Anicteric sclerae. Normal nasal mucosa.   No oral  lesions,  mouth clear,  oropharynx clear, no postnasal drip. (-) Oral thrush. No dental caries.  Airway - Mallampati class III  Neck: No masses. Midline trachea. No JVD, (-) LAD. (-) bruits appreciated.  Respiratory/Chest: Grossly normal chest. (-) deformity. (-) Accessory muscle use.  Symmetric expansion. (-) Tenderness on palpation.  Resonant on percussion.  Diminished BS on both lower lung zones. (-) wheezing, crackles, rhonchi (-) egophony  Cardiovascular: Regular rate and  rhythm, heart sounds normal, no murmur or gallops, no peripheral edema  Gastrointestinal:  Normal bowel sounds. Soft, non-tender. No hepatosplenomegaly.  (-) masses.   Musculoskeletal:  Normal muscle tone. Normal  gait.   Extremities: Grossly normal. (-) clubbing, cyanosis.  (-) edema  Skin: (-) rash,lesions seen.   Neurological/Psychiatric : alert, oriented to time, place, person. Normal mood and affect           Assessment & Plan:  OSA (obstructive sleep apnea) Patient was diagnosed with OSA at least 5-6 yrs ago.   He had a split night study, unsure severity. He was on cpap and he had humidification issues which prevented him from using cpap the last 6 months.   Patient has hypersomnia, snoring, gasping, choking, unrefreshed sleep. Also with fatigue. These are better with CPAP therapy but he had stopped CPAP  6-7 months ago because of humidifier issues. Since being off CPAP, symptoms have gotten worse. He had a study 2 years ago to determine optimal pressure  Plan :  We discussed about the diagnosis of Obstructive Sleep Apnea (OSA) and implications of untreated OSA. We discussed about CPAP and BiPaP as possible treatment options.    We will schedule the patient for a sleep study. Plan for a split-night sleep study. He lives and [place]. Might have logistics issues. Lab study better than a home study. Anticipate will not have issues with auto CPAP. Had humidifier issues before.   Patient was instructed to call the office if he/she has not heard back from the office 1-2 weeks after the sleep study.   Patient was instructed to call the office if he/she is having issues with the PAP device.   We discussed good sleep hygiene.   Patient was advised not to engage in activities requiring concentration and/or vigilance if he/she is sleepy.  Patient was advised not to drive if he/she is sleepy.    Depression Pt  with chronic depression. Since being off CPAP, worse depression, fatigue, hypersomnia. He is medicine was also switched from Effexor to duloxetine.  Plan to treat sleep apnea first. If not better despite CPAP use, may need adjunctive medical therapy for depression or  hypersomnia.    Thank you very much for letting me participate in this patient's care. Please do not hesitate to give me a call if you have any questions or concerns regarding the treatment plan.   Patient will follow up with me in 6-8 weeks    J. Shirl Harris, MD 12/03/2016   1:15 PM [facility] [place] Pager: [phone] Office: [phone], Fax: [phone]

## 2016-12-03 NOTE — Patient Instructions (Signed)
It was a pleasure taking care of you today!  We will schedule you to have a sleep study to determine if you have sleep apnea.    We will get a lab sleep study.  You will be scheduled to have a lab sleep study in 4-6 weeks.  Someone from the sleep lab will call you in 2-3 days to schedule the study with you.  They usually have cancellations every night so most likely, they will have openings for a lab sleep study next week or so.  We encourage you to do your sleep study then if possible. Please give us a call in a week is no one from the sleep lab calls you in 2-3 days.   If the sleep study is positive, we will order you a CPAP  machine.  Please call the office if you do NOT receive your machine in the next 1-2 weeks.   Please make sure you use your CPAP device everytime you sleep.  We will monitor the usage of your machine per your insurance requirement.  Your insurance company may take the machine from you if you are not using it regularly.   Please clean the mask, tubings, filter, water reservoir with soapy water every week.  Please use distilled water for the water reservoir.   Please call the office or your machine provider (DME company) if you are having issues with the device.    Return to clinic in 6-8 weeks with Dr. De Dios or NP   

## 2016-12-03 NOTE — Assessment & Plan Note (Signed)
Pt  with chronic depression. Since being off CPAP, worse depression, fatigue, hypersomnia. He is medicine was also switched from Effexor to duloxetine.  Plan to treat sleep apnea first. If not better despite CPAP use, may need adjunctive medical therapy for depression or hypersomnia.

## 2017-01-17 ENCOUNTER — Ambulatory Visit (HOSPITAL_BASED_OUTPATIENT_CLINIC_OR_DEPARTMENT_OTHER): Payer: BLUE CROSS/BLUE SHIELD | Attending: Pulmonary Disease | Admitting: Pulmonary Disease

## 2017-01-17 DIAGNOSIS — G4730 Sleep apnea, unspecified: Secondary | ICD-10-CM

## 2017-01-17 DIAGNOSIS — G4733 Obstructive sleep apnea (adult) (pediatric): Secondary | ICD-10-CM | POA: Insufficient documentation

## 2017-01-20 ENCOUNTER — Telehealth: Payer: Self-pay | Admitting: Pulmonary Disease

## 2017-01-20 DIAGNOSIS — G4733 Obstructive sleep apnea (adult) (pediatric): Secondary | ICD-10-CM

## 2017-01-20 DIAGNOSIS — G4730 Sleep apnea, unspecified: Secondary | ICD-10-CM | POA: Diagnosis not present

## 2017-01-20 NOTE — Telephone Encounter (Signed)
    Please call the pt and tell the pt the LAB SLEEP STUDY  showed OSA  Pt stops breathing 14   times an hour.   Please schedule a CPAP titration study as soon as there is an opening. We need to determine optimal pressure on cpap.   Thanks!   J. Shirl Harris, MD 01/20/2017, 2:17 PM

## 2017-01-20 NOTE — Procedures (Signed)
    NAME: Matthew Pratt DATE OF BIRTH:  [birth year] MEDICAL RECORD NUMBER [id number]  LOCATION: [facility]  PHYSICIAN: [place]  DATE OF STUDY: 01/17/2017  CLINICAL INFORMATION  Sleep Study Type: NPSG  Indication for sleep study: OSA   Epworth Sleepiness Score: 13   SLEEP STUDY TECHNIQUE  As per the AASM Manual for the Scoring of Sleep and Associated Events v2.3 (April 2016) with a hypopnea requiring 4% desaturations.  The channels recorded and monitored were frontal, central and occipital EEG, electrooculogram (EOG), submentalis EMG (chin), nasal and oral airflow, thoracic and abdominal wall motion, anterior tibialis EMG, snore microphone, electrocardiogram, and pulse oximetry.   MEDICATIONS  Medications self-administered by patient taken the night of the study : N/A. Meds reviewed per chart review done.  SLEEP ARCHITECTURE  The study was initiated at 9:31:01 PM and ended at 3:55:57 AM.  Sleep onset time was 12.7 minutes and the sleep efficiency was 80.3%. The total sleep time was 309.3 minutes.  Stage REM latency was 178.5 minutes.  The patient spent 10.51% of the night in stage N1 sleep, 66.21% in stage N2 sleep, 0.00% in stage N3 and 23.28% in REM.  Alpha intrusion was absent.  Supine sleep was 47.37%.   RESPIRATORY PARAMETERS  The overall apnea/hypopnea index (AHI) was 14.0 per hour. There were 46 total apneas, including 15 obstructive, 31 central and 0 mixed apneas. There were 26 hypopneas and 21 RERAs.  The AHI during Stage REM sleep was 25.8 per hour. AHI while supine was 26.6 per hour.  The mean oxygen saturation was 92.90%. The minimum SpO2 during sleep was 86.00%.  Loud snoring was noted during this study.  CARDIAC DATA  The 2 lead EKG demonstrated sinus rhythm. The mean heart rate was 45.61 beats per minute. Other EKG findings include: None.   LEG MOVEMENT DATA  The total PLMS were 177 with a resulting PLMS index of 34.34.  Associated arousal with leg movement index was 0.6 .  IMPRESSIONS  1. Mild-moderate obstructive sleep apnea occurred during this study (AHI = 14.0/h), worse during supine and REM sleep. 2. Mild central sleep apnea occurred during this study (CAI = 6.0/h). 3. Mild oxygen desaturation was noted during this study (Min O2 = 86.00%). 4. The patient snored with Loud snoring volume. 5. No cardiac abnormalities were noted during this study. 6. Moderate periodic limb movements of sleep occurred during the study. No significant associated arousals.  DIAGNOSIS  Obstructive Sleep Apnea (327.23 [G47.33 ICD-10]), worse during REM and supine sleep.   RECOMMENDATIONS  1. CPAP titration to determine optimal pressure required to alleviate sleep disordered breathing. We will schedule a CPAP titration study.  2. Avoid alcohol, sedatives and other CNS depressants that may worsen sleep apnea and disrupt normal sleep architecture. 3. Sleep hygiene should be reviewed to assess factors that may improve sleep quality. 4. Weight management and regular exercise should be initiated or continued if appropriate. 5. Follow up in the office after obtaining cpap machine.   Monica Becton, MD 01/20/2017, 2:14 PM [place] [facility] Pager [phone] After 3 pm or if no answer, call [phone]

## 2017-01-20 NOTE — Telephone Encounter (Signed)
pls see note above re: sleep study results and need for cpap.  Thanks.

## 2017-01-21 NOTE — Telephone Encounter (Signed)
lmomtcb x1 

## 2017-01-25 ENCOUNTER — Telehealth: Payer: Self-pay

## 2017-01-25 NOTE — Addendum Note (Signed)
Addended by: Marin Roberts on: 01/25/2017 02:32 PM   Modules accepted: Orders

## 2017-01-25 NOTE — Telephone Encounter (Signed)
error 

## 2017-01-25 NOTE — Telephone Encounter (Signed)
Spoke with pt and made him aware of his lab study results per AD. Pt understood and agreed to the titration study. The order was placed. He had no further questions. Nothing further is needed

## 2017-02-01 ENCOUNTER — Ambulatory Visit: Payer: BLUE CROSS/BLUE SHIELD | Admitting: Pulmonary Disease

## 2017-02-17 ENCOUNTER — Ambulatory Visit (HOSPITAL_BASED_OUTPATIENT_CLINIC_OR_DEPARTMENT_OTHER): Payer: BLUE CROSS/BLUE SHIELD | Attending: Pulmonary Disease | Admitting: Pulmonary Disease

## 2017-02-17 VITALS — Ht 74.0 in | Wt 217.0 lb

## 2017-02-17 DIAGNOSIS — G4733 Obstructive sleep apnea (adult) (pediatric): Secondary | ICD-10-CM | POA: Diagnosis not present

## 2017-02-21 ENCOUNTER — Telehealth: Payer: Self-pay | Admitting: Pulmonary Disease

## 2017-02-21 DIAGNOSIS — G4733 Obstructive sleep apnea (adult) (pediatric): Secondary | ICD-10-CM

## 2017-02-21 NOTE — Procedures (Signed)
    NAME: Matthew Pratt DATE OF BIRTH:  [birth year] MEDICAL RECORD NUMBER [id number]  LOCATION: [facility]  PHYSICIAN: [place]  DATE OF STUDY: 02/17/2017   CLINICAL INFORMATION  The patient is referred for a CPAP titration to treat sleep apnea. Date of NPSG, Split Night or HST:  SLEEP STUDY TECHNIQUE  As per the AASM Manual for the Scoring of Sleep and Associated Events v2.3 (April 2016) with a hypopnea requiring 4% desaturations.  The channels recorded and monitored were frontal, central and occipital EEG, electrooculogram (EOG), submentalis EMG (chin), nasal and oral airflow, thoracic and abdominal wall motion, anterior tibialis EMG, snore microphone, electrocardiogram, and pulse oximetry. Continuous positive airway pressure (CPAP) was initiated at the beginning of the study and titrated to treat sleep-disordered breathing. MEDICATIONS  Medications self-administered by patient taken the night of the study : N/A  TECHNICIAN COMMENTS  Comments added by technician: Patient was restless all through the night. Patient had difficulty initiating sleep.  Comments added by scorer: N/A   RESPIRATORY PARAMETERS  Optimal PAP Pressure (cm): 7 AHI at Optimal Pressure (/hr): 2.7  Overall Minimal O2 (%): 91.00 Supine % at Optimal Pressure (%): 36  Minimal O2 at Optimal Pressure (%): 91.0    SLEEP ARCHITECTURE  The study was initiated at 9:30:02 PM and ended at 4:51:16 AM.  Sleep onset time was 42.6 minutes and the sleep efficiency was 71.0%. The total sleep time was 313.5 minutes.  The patient spent 10.05% of the night in stage N1 sleep, 55.82% in stage N2 sleep, 0.00% in stage N3 and 34.13% in REM.Stage REM latency was 210.5 minutes  Wake after sleep onset was 85.2. Alpha intrusion was absent. Supine sleep was 49.76%.  CARDIAC DATA  The 2 lead EKG demonstrated sinus rhythm. The mean heart rate was 48.80 beats per minute. Other EKG findings include: PVCs.   LEG  MOVEMENT DATA  The total Periodic Limb Movements of Sleep (PLMS) were 0. The PLMS index was 0.00. A PLMS index of <15 is considered normal in adults.  IMPRESSIONS  1. The optimal PAP pressure was 7 cm of water. Patient had REM sleep with this setting. At higher cpap settings, patient started having central apneas. 2. Central sleep apnea was not noted during this titration (CAI = 3.8/h). 3. Significant oxygen desaturations were not observed during this titration (min O2 = 91.00%). 4. No snoring was audible during this study. 5. 2-lead EKG demonstrated: PVCs 6. Clinically significant periodic limb movements were not noted during this study. Arousals associated with PLMs were rare. 7.  DIAGNOSIS  Obstructive Sleep Apnea (327.23 [G47.33 ICD-10])   RECOMMENDATIONS  1. Trial of CPAP therapy on 7 cm H2O with a Medium Large size Fisher&Paykel Nasal Pillow Mask  and heated humidification. At higher cpap settings, patient started having central apneas.  2. Avoid alcohol, sedatives and other CNS depressants that may worsen sleep apnea and disrupt normal sleep architecture. 3. Sleep hygiene should be reviewed to assess factors that may improve sleep quality. 4. Weight management and regular exercise should be initiated or continued. 5. Follow up in the office 4-6 weeks after obtaining his cpap machine.  Monica Becton, MD 02/21/2017, 10:49 AM [place] [facility] Pager [phone] After 3 pm or if no answer, call [phone]

## 2017-02-21 NOTE — Telephone Encounter (Signed)
Spoke with pt and made him aware of his results per AD. Pt agreed to the order being placed. The order was placed. Per pt request to wait till he has received his machine to schedule a follow up ov. Nothing further is needed at this time.

## 2017-02-21 NOTE — Telephone Encounter (Signed)
  Please call the pt and tell the pt the CPAP SLEEP STUDY  showed cpap 7 cm water worked for him.     Please order autoCPAP machine, set at 7 cm H2O with a Medium/ Large size Fisher&Paykel Nasal Pillow Mask. He will need a mask fitting session to determine best mask fit  Patient will need a mask fitting session. Patient will need a 1 month download.   Patient needs to be seen by me or any of the NPs/APPs  4-6 weeks after obtaining the cpap machine. Let me know if you receive this.   Thanks!   J. Shirl Harris, MD 02/21/2017, 10:50 AM

## 2017-04-15 ENCOUNTER — Encounter: Payer: Self-pay | Admitting: Pulmonary Disease

## 2017-04-17 ENCOUNTER — Encounter: Payer: Self-pay | Admitting: Pulmonary Disease

## 2017-04-22 ENCOUNTER — Telehealth: Payer: Self-pay | Admitting: Pulmonary Disease

## 2017-04-22 NOTE — Telephone Encounter (Signed)
   Download the last month: 100%, AHI  4. CPAP 7 cm water.  Monica Becton, MD 04/22/2017, 2:10 PM [place] [facility] Pager [phone] After 3 pm or if no answer, call [phone]

## 2017-04-27 ENCOUNTER — Encounter: Payer: Self-pay | Admitting: Pulmonary Disease

## 2017-04-29 ENCOUNTER — Ambulatory Visit (INDEPENDENT_AMBULATORY_CARE_PROVIDER_SITE_OTHER): Payer: Medicare HMO | Admitting: Pulmonary Disease

## 2017-04-29 ENCOUNTER — Encounter: Payer: Self-pay | Admitting: Pulmonary Disease

## 2017-04-29 DIAGNOSIS — G4733 Obstructive sleep apnea (adult) (pediatric): Secondary | ICD-10-CM

## 2017-04-29 NOTE — Progress Notes (Signed)
Subjective:    Patient ID: Matthew Pratt, male    DOB: 04-17-1951, 66 y.o.   MRN: [id number]  HPI   This is the case of Matthew Pratt, 66 y.o. Male, who was referred by Dr. Lovette Cliche in consultation regarding OSA.    Patient is a nonsmoker, denies history of asthma or COPD.  As you very well know, patient was diagnosed with OSA at least 5-6 yrs ago.   He had a split night study, unsure severity. He was on cpap and he had humidification issues which prevented him from using cpap the last 6 months.   Patient has hypersomnia, snoring, gasping, choking, unrefreshed sleep. Also with fatigue. These are better with CPAP therapy but he had stopped CPAP 6-7 months ago because of humidifier issues. Since being off CPAP, symptoms have gotten worse. He had a study 2 years ago to determine optimal pressure.  Pt was on Effexor which has been working for 10 yrs.  Effexor was switched to Duloxetine recently 2/2 insurance issues.  Depression was better controlled with Effexor. Recent depression and fatigue and difficulty falling and staying asleep.  Patient denies abnormal behavior and sleep.  ROV 04/29/17 Patient returns to the office as follow-up with sleep apnea. Since last seen, he had a lab study which showed moderate sleep apnea with an AHI of 14. Study was done in February of this year. He had a CPAP titration study which showed optimal was 7 cm water. Study was done in March. DL  the last month: 97%, AHI 4. He feels better using it. More energy. Less sleepiness. Feels benefit of CPAP. Loves his cpap.    Review of Systems  Constitutional: Negative.  Negative for fever and unexpected weight change.  HENT: Negative.  Negative for congestion, dental problem, ear pain, nosebleeds, postnasal drip, rhinorrhea, sinus pressure, sneezing, sore throat and trouble swallowing.   Eyes: Negative.  Negative for redness and itching.  Respiratory: Negative.  Negative for cough, chest tightness, shortness of  breath and wheezing.   Cardiovascular: Negative.  Negative for palpitations and leg swelling.  Gastrointestinal: Negative.  Negative for nausea and vomiting.  Endocrine: Negative.   Genitourinary: Negative.  Negative for dysuria.  Musculoskeletal: Negative.  Negative for joint swelling.  Skin: Negative.  Negative for rash.  Allergic/Immunologic: Negative.   Neurological: Negative.  Negative for headaches.  Hematological: Bruises/bleeds easily.  Psychiatric/Behavioral: Negative.  Negative for dysphoric mood. The patient is not nervous/anxious.      Objective:   Physical Exam Vitals:  Vitals:   04/29/17 1130  BP: 110/70  Pulse: (!) 57  SpO2: 96%  Weight: 229 lb 12.8 oz (104.2 kg)  Height: 6\' 2"  (1.88 m)    Constitutional/General:  Pleasant, well-nourished, well-developed, not in any distress,  Comfortably seating.  Well kempt  Body mass index is 29.5 kg/m. Wt Readings from Last 3 Encounters:  04/29/17 229 lb 12.8 oz (104.2 kg)  02/17/17 217 lb (98.4 kg)  01/17/17 208 lb (94.3 kg)     HEENT: Pupils equal and reactive to light and accommodation. Anicteric sclerae. Normal nasal mucosa.   No oral  lesions,  mouth clear,  oropharynx clear, no postnasal drip. (-) Oral thrush. No dental caries.  Airway - Mallampati class III  Neck: No masses. Midline trachea. No JVD, (-) LAD. (-) bruits appreciated.  Respiratory/Chest: Grossly normal chest. (-) deformity. (-) Accessory muscle use.  Symmetric expansion. (-) Tenderness on palpation.  Resonant on percussion.  Diminished BS on both lower  lung zones. (-) wheezing, crackles, rhonchi (-) egophony  Cardiovascular: Regular rate and  rhythm, heart sounds normal, no murmur or gallops, no peripheral edema  Gastrointestinal:  Normal bowel sounds. Soft, non-tender. No hepatosplenomegaly.  (-) masses.   Musculoskeletal:  Normal muscle tone. Normal gait.   Extremities: Grossly normal. (-) clubbing, cyanosis.  (-) edema  Skin: (-)  rash,lesions seen.   Neurological/Psychiatric : alert, oriented to time, place, person. Normal mood and affect           Assessment & Plan:  OSA (obstructive sleep apnea) Patient was diagnosed with OSA at least 5-6 yrs ago.   He had a split night study, unsure severity. He was on cpap and he had humidification issues which prevented him from using cpap the last 6 months.   Patient has hypersomnia, snoring, gasping, choking, unrefreshed sleep. Also with fatigue. These are better with CPAP therapy but he had stopped CPAP 6-7 months ago because of humidifier issues. Since being off CPAP, symptoms have gotten worse. He had a study 2 years ago to determine optimal pressure  He had a lab study in February which showed an AHI of 14. He had a CPAP titration study in March which showed he was optimal on 7 cm water. He uses CPAP therapy. Feels better using it. More energy. Less sleepiness. He loves his CPAP machine. Feels benefit.   Plan :  We extensively discussed the importance of treating OSA and the need to use PAP therapy.   Continue with cpap 7 cm water.    Patient was instructed to have mask, tubings, filter, reservoir cleaned at least once a week with soapy water.  Patient was instructed to call the office if he/she is having issues with the PAP device.    I advised patient to obtain sufficient amount of sleep --  7 to 8 hours at least in a 24 hr period.  Patient was advised to follow good sleep hygiene.  Patient was advised NOT to engage in activities requiring concentration and/or vigilance if he/she is and  sleepy.  Patient is NOT to drive if he/she is sleepy.     Return to clinic in 1 year    J. Shirl Harris, MD 04/29/2017   12:07 PM [facility] [place] Pager: [phone] Office: [phone], Fax: [phone]

## 2017-04-29 NOTE — Patient Instructions (Signed)
  It was a pleasure taking care of you today!  Continue using your CPAP machine.   Please make sure you use your CPAP device everytime you sleep.  We will monitor the usage of your machine per your insurance requirement.  Your insurance company may take the machine from you if you are not using it regularly.   Please clean the mask, tubings, filter, water reservoir with soapy water every week.  Please use distilled water for the water reservoir.   Please call the office or your machine provider (DME company) if you are having issues with the device.   Return to clinic in 1 year  with NP.    

## 2017-04-29 NOTE — Assessment & Plan Note (Signed)
Patient was diagnosed with OSA at least 5-6 yrs ago.   He had a split night study, unsure severity. He was on cpap and he had humidification issues which prevented him from using cpap the last 6 months.   Patient has hypersomnia, snoring, gasping, choking, unrefreshed sleep. Also with fatigue. These are better with CPAP therapy but he had stopped CPAP 6-7 months ago because of humidifier issues. Since being off CPAP, symptoms have gotten worse. He had a study 2 years ago to determine optimal pressure  He had a lab study in February which showed an AHI of 14. He had a CPAP titration study in March which showed he was optimal on 7 cm water. He uses CPAP therapy. Feels better using it. More energy. Less sleepiness. He loves his CPAP machine. Feels benefit.   Plan :  We extensively discussed the importance of treating OSA and the need to use PAP therapy.   Continue with cpap 7 cm water.    Patient was instructed to have mask, tubings, filter, reservoir cleaned at least once a week with soapy water.  Patient was instructed to call the office if he/she is having issues with the PAP device.    I advised patient to obtain sufficient amount of sleep --  7 to 8 hours at least in a 24 hr period.  Patient was advised to follow good sleep hygiene.  Patient was advised NOT to engage in activities requiring concentration and/or vigilance if he/she is and  sleepy.  Patient is NOT to drive if he/she is sleepy.

## 2017-06-16 DIAGNOSIS — N401 Enlarged prostate with lower urinary tract symptoms: Secondary | ICD-10-CM | POA: Insufficient documentation

## 2017-06-16 DIAGNOSIS — R5383 Other fatigue: Secondary | ICD-10-CM | POA: Insufficient documentation

## 2017-06-16 DIAGNOSIS — I252 Old myocardial infarction: Secondary | ICD-10-CM | POA: Insufficient documentation

## 2017-06-23 DIAGNOSIS — M720 Palmar fascial fibromatosis [Dupuytren]: Secondary | ICD-10-CM | POA: Insufficient documentation

## 2018-02-24 DIAGNOSIS — Z0000 Encounter for general adult medical examination without abnormal findings: Secondary | ICD-10-CM | POA: Insufficient documentation

## 2018-07-10 ENCOUNTER — Telehealth: Payer: Self-pay | Admitting: Family Medicine

## 2018-07-10 NOTE — Telephone Encounter (Signed)
ATC pt but we were cut off.   Dr Elease Hashimoto is not accepting new patients at this time. We have several other providers who are: [place], Volanda Napoleon, Ethlyn Gallery and Pension scheme manager. He can choose from any of those. We also have several other offices close by that have providers accepting new patients. He can review all our providers on the [facility] website.

## 2018-07-10 NOTE — Telephone Encounter (Signed)
Copied from [place] [id number]. Topic: General - Other >> Jul 10, 2018  2:16 PM Cecelia Byars, NT wrote: Reason for CRM: Patient says he was told that Dr Elease Hashimoto is accepting new patients by Dr Dennison Nancy ,  he would like for him to become his pcp,   please advise [phone]

## 2018-07-11 NOTE — Telephone Encounter (Signed)
Per Dr Elease Hashimoto appointment made.

## 2018-07-26 ENCOUNTER — Encounter: Payer: Self-pay | Admitting: Family Medicine

## 2018-07-26 ENCOUNTER — Ambulatory Visit (INDEPENDENT_AMBULATORY_CARE_PROVIDER_SITE_OTHER): Payer: Medicare HMO | Admitting: Family Medicine

## 2018-07-26 VITALS — BP 120/84 | HR 48 | Temp 97.9°F | Ht 73.5 in | Wt 224.1 lb

## 2018-07-26 DIAGNOSIS — K22719 Barrett's esophagus with dysplasia, unspecified: Secondary | ICD-10-CM

## 2018-07-26 DIAGNOSIS — E559 Vitamin D deficiency, unspecified: Secondary | ICD-10-CM

## 2018-07-26 DIAGNOSIS — L719 Rosacea, unspecified: Secondary | ICD-10-CM

## 2018-07-26 DIAGNOSIS — E039 Hypothyroidism, unspecified: Secondary | ICD-10-CM

## 2018-07-26 DIAGNOSIS — E785 Hyperlipidemia, unspecified: Secondary | ICD-10-CM

## 2018-07-26 DIAGNOSIS — I2510 Atherosclerotic heart disease of native coronary artery without angina pectoris: Secondary | ICD-10-CM

## 2018-07-26 DIAGNOSIS — M791 Myalgia, unspecified site: Secondary | ICD-10-CM

## 2018-07-26 DIAGNOSIS — K2270 Barrett's esophagus without dysplasia: Secondary | ICD-10-CM | POA: Insufficient documentation

## 2018-07-26 DIAGNOSIS — G4733 Obstructive sleep apnea (adult) (pediatric): Secondary | ICD-10-CM

## 2018-07-26 DIAGNOSIS — K219 Gastro-esophageal reflux disease without esophagitis: Secondary | ICD-10-CM | POA: Insufficient documentation

## 2018-07-26 LAB — BASIC METABOLIC PANEL
BUN: 21 mg/dL (ref 6–23)
CALCIUM: 9.5 mg/dL (ref 8.4–10.5)
CO2: 29 mEq/L (ref 19–32)
CREATININE: 1.2 mg/dL (ref 0.40–1.50)
Chloride: 101 mEq/L (ref 96–112)
GFR: 63.72 mL/min (ref >60.00)
Glucose, Bld: 94 mg/dL (ref 70–99)
Potassium: 4.2 mEq/L (ref 3.5–5.1)
Sodium: 138 mEq/L (ref 135–145)

## 2018-07-26 LAB — VITAMIN D 25 HYDROXY (VIT D DEFICIENCY, FRACTURES): VITD: 19.19 ng/mL — AB (ref 30.00–100.00)

## 2018-07-26 LAB — HEPATIC FUNCTION PANEL
ALK PHOS: 64 U/L (ref 39–117)
ALT: 29 U/L (ref 0–53)
AST: 27 U/L (ref 0–37)
Albumin: 4.4 g/dL (ref 3.5–5.2)
BILIRUBIN DIRECT: 0.2 mg/dL (ref 0.0–0.3)
BILIRUBIN TOTAL: 1.7 mg/dL — AB (ref 0.2–1.2)
Total Protein: 6.9 g/dL (ref 6.0–8.3)

## 2018-07-26 LAB — CBC WITH DIFFERENTIAL/PLATELET
BASOS PCT: 1 % (ref 0.0–3.0)
Basophils Absolute: 0 10*3/uL (ref 0.0–0.1)
EOS PCT: 9.2 % — AB (ref 0.0–5.0)
Eosinophils Absolute: 0.3 10*3/uL (ref 0.0–0.7)
HCT: 43 % (ref 39.0–52.0)
HEMOGLOBIN: 14.6 g/dL (ref 13.0–17.0)
Lymphocytes Relative: 35.6 % (ref 12.0–46.0)
Lymphs Abs: 1.2 10*3/uL (ref 0.7–4.0)
MCHC: 34 g/dL (ref 30.0–36.0)
MCV: 101.5 fl — ABNORMAL HIGH (ref 78.0–100.0)
MONO ABS: 0.3 10*3/uL (ref 0.1–1.0)
Monocytes Relative: 10.2 % (ref 3.0–12.0)
NEUTROS ABS: 1.5 10*3/uL (ref 1.4–7.7)
Neutrophils Relative %: 44 % (ref 43.0–77.0)
PLATELETS: 175 10*3/uL (ref 150.0–400.0)
RBC: 4.23 Mil/uL (ref 4.22–5.81)
RDW: 12.8 % (ref 11.5–15.5)
WBC: 3.4 10*3/uL — AB (ref 4.0–10.5)

## 2018-07-26 LAB — LIPID PANEL
CHOLESTEROL: 210 mg/dL — AB (ref 0–200)
HDL: 47.6 mg/dL (ref >39.00)
LDL CALC: 145 mg/dL — AB (ref 0–99)
NonHDL: 162.05
Total CHOL/HDL Ratio: 4
Triglycerides: 87 mg/dL (ref 0.0–149.0)
VLDL: 17.4 mg/dL (ref 0.0–40.0)

## 2018-07-26 LAB — SEDIMENTATION RATE: Sed Rate: 11 mm/hr (ref 0–20)

## 2018-07-26 LAB — TSH: TSH: 3.05 u[IU]/mL (ref 0.35–4.50)

## 2018-07-26 MED ORDER — LEVOTHYROXINE SODIUM 137 MCG PO TABS: ORAL_TABLET | ORAL | 3 refills | Status: DC

## 2018-07-26 MED ORDER — PITAVASTATIN CALCIUM 2 MG PO TABS: 2.0000 mg | ORAL_TABLET | Freq: Every day | ORAL | 6 refills | Status: DC

## 2018-07-26 NOTE — Progress Notes (Signed)
Subjective:     Patient ID: Matthew Pratt, male   DOB: 1951/06/09, 67 y.o.   MRN: [id number]  HPI Patient seen to establish care. He just recently moved back to the [place] area. He has seen multiple specialists and would like to try to centralize/consolidate some his care. He has multiple chronic problems including history of CAD,, history of recurrent depression, GERD, history of reported Barrett's esophagus, rosacea, obstructive sleep apnea, CAD, hypothyroidism. He has seen several specialists in the past including endocrinology, ophthalmology, cardiology, psychology  Medications reviewed. Depression stable. He's had previous treatment with TMS-which helped. Currently not taking any antidepressants.  Needs refills of Synthroid. He is unsure when his last thyroid labs were checked.  He complains of some myalgias involving both lower extremities. This is more of a myalgias than arthralgia. Denies any neck or upper trunk myalgia. Denies any lower extremity weakness. Does have some intermittent low back pain. Symptoms seem to be worse with walking. Prior intolerance with multiple statins and currently not taking any statin. He's had previous intolerance with pravastatin and Lipitor.  No recent chest pains. He had stent placed apparently back in 2011-first obtuse marginal branch of circumflex. No recent cardiology follow-up. He thinks he had a stress test about 5 years ago.  Obstructive sleep apnea. He has not been using his CPAP consistently since the move.  Has been frustrated with inability to lose weight. Has not apparently had a physical in quite some time.  Past Medical History:  Diagnosis Date  . BPH (benign prostatic hypertrophy)   . CAD (coronary artery disease)    s/p STEMI 10/15/10 w/ Promus DES placed x1 in the first OM  . Depression with anxiety   . GERD (gastroesophageal reflux disease)   . Glaucoma   . HLD (hyperlipidemia)   . OSA on CPAP      reports that he has never  smoked. He has never used smokeless tobacco. He reports that he does not drink alcohol or use drugs. family history includes Arthritis in his maternal grandmother and paternal grandfather; COPD in his mother and unknown relative; Cancer in his father, paternal grandfather, paternal grandmother, sister, and sister; Heart failure in his maternal grandfather; Varicose Veins in his father. Allergies  Allergen Reactions  . Iodinated Diagnostic Agents Hives  . Sulfonamide Derivatives     REACTION: rash     Review of Systems  Constitutional: Negative for appetite change, fatigue and unexpected weight change.  Respiratory: Negative for cough, chest tightness and shortness of breath.   Cardiovascular: Negative for chest pain, palpitations and leg swelling.  Gastrointestinal: Negative for abdominal pain.  Musculoskeletal: Positive for back pain and myalgias. Negative for arthralgias.  Neurological: Negative for dizziness, syncope, weakness, light-headedness and headaches.       Objective:   Physical Exam  Constitutional: He is oriented to person, place, and time. He appears well-developed and well-nourished.  Neck: Neck supple. No thyromegaly present.  Cardiovascular: Normal rate and regular rhythm.  Pulmonary/Chest: Effort normal and breath sounds normal. He has no wheezes. He has no rales.  Musculoskeletal: He exhibits no edema.  Lymphadenopathy:    He has no cervical adenopathy.  Neurological: He is alert and oriented to person, place, and time.       Assessment:     #1 history of CAD-history of stent to first obtuse marginal branch and circumflex artery back in 2011. Has not had regular cardiology follow-up since then  #2 history of dyslipidemia with prior intolerance to multiple  statins.  Needs better lipid control in looking over most recent labs on Care Everywhere.  #3 history of obstructive sleep apnea  #4 GERD currently symptomatically stable with history of Barrett's  esophagus  #5 hypothyroidism on replacement  #6 history of depression currently stable off medications. He's had prior successful treatment with [place].  #7 history of glaucoma followed by ophthalmology  #8 history of rosacea  #9 bilateral lower extremity myalgias. Etiology unclear. He does have some pain radiating into the buttocks and with walking with suggest possible component of lumbar stenosis but some symptoms are at rest. No associated weakness    Plan:     -Check further labs to evaluate his myalgias including sedimentation rate and 25 hydroxy vitamin D level -Reassess TSH to follow his hypothyroidism -Recheck lipid and hepatic panel and basic metabolic panel -Consider trial of low-dose Livalo 2 mg one half tablet Monday, Wednesday, and Friday and if tolerating well go up to 2 mg 3 days weekly -Refill Synthroid for one year -Set up complete physical. Can discuss at that point possible referral to other specialists locally including cardiology -Will discuss strategies for healthy weight loss at physical  [name] MD [facility] at [facility]

## 2018-07-26 NOTE — Patient Instructions (Signed)
Start the Livalo at 2 mg one half tablet every Monday, Wednesday, and Friday.   If tolerating well after 2 weeks then increase to full tablet M, W, and F.  Consider setting up complete physical.

## 2018-08-02 ENCOUNTER — Encounter: Payer: Self-pay | Admitting: Family Medicine

## 2018-08-06 ENCOUNTER — Encounter: Payer: Self-pay | Admitting: Family Medicine

## 2018-08-11 ENCOUNTER — Encounter: Payer: Medicare HMO | Admitting: Family Medicine

## 2018-08-16 ENCOUNTER — Ambulatory Visit (INDEPENDENT_AMBULATORY_CARE_PROVIDER_SITE_OTHER): Payer: Medicare HMO | Admitting: Family Medicine

## 2018-08-16 ENCOUNTER — Encounter: Payer: Self-pay | Admitting: Family Medicine

## 2018-08-16 VITALS — BP 110/70 | HR 56 | Temp 98.0°F | Wt 224.0 lb

## 2018-08-16 DIAGNOSIS — R197 Diarrhea, unspecified: Secondary | ICD-10-CM | POA: Diagnosis not present

## 2018-08-16 DIAGNOSIS — R1084 Generalized abdominal pain: Secondary | ICD-10-CM

## 2018-08-16 NOTE — Progress Notes (Signed)
Subjective:     Patient ID: Matthew Pratt, male   DOB: 1951-07-11, 67 y.o.   MRN: [id number]  HPI Patient just recently established care here. Refer to previous note for details regarding his past medical history. He's had some chronic GI complaints. He states now for about 6-8 months he's had some increased frequency and changing consistency with bowel movements. He states that he frequently has loose more frequent bowel movements- especially after eating. Cannot relate to any specific foods.  No history of lactose intolerance. No history of gluten insensitivity. No food allergies. No recent antibiotics. No recent travels. Previous Helicobacter pylori testing negative. No bloody stools. Patient had EGD and colonoscopy earlier this year which were unremarkable. Also had ultrasound of abdomen and pelvis which was unremarkable.  He describes some fairly diffuse intermittent abdominal pain associated with frequent loose stools but no bloody stools. Usually has 3-4 stools per day with episodes.  Low vitamin D level 19. Currently taking vitamin D 2000 international units once daily. Still has some myalgias. He discontinued Livalo. Has tried multiple statins and feels like each of these caused increased myalgias. Recent thyroid function normal.  Recent sed rate 11.    Weight is unchanged from last visit. Recent labs unremarkable. No recent anemia.  Past Medical History:  Diagnosis Date  . Arthritis   . BPH (benign prostatic hypertrophy)   . CAD (coronary artery disease)    s/p STEMI 10/15/10 w/ Promus DES placed x1 in the first OM  . Depression   . Depression with anxiety   . GERD (gastroesophageal reflux disease)   . Glaucoma   . HLD (hyperlipidemia)   . OSA on CPAP   . Thyroid disease      reports that he has never smoked. He has never used smokeless tobacco. He reports that he does not drink alcohol or use drugs. family history includes Arthritis in his maternal grandmother and  paternal grandfather; COPD in his mother and unknown relative; Cancer in his father, paternal grandfather, paternal grandmother, sister, and sister; Heart failure in his maternal grandfather; Varicose Veins in his father. Allergies  Allergen Reactions  . Iodinated Diagnostic Agents Hives  . Sulfonamide Derivatives     REACTION: rash     Review of Systems  Constitutional: Negative for appetite change and unexpected weight change.  Respiratory: Negative for shortness of breath.   Cardiovascular: Negative for chest pain.  Gastrointestinal: Positive for abdominal pain and diarrhea. Negative for abdominal distention, blood in stool, constipation, nausea and vomiting.  Skin: Negative for rash.  Neurological: Negative for weakness.       Objective:   Physical Exam  Constitutional: He appears well-developed and well-nourished.  Neck: Neck supple.  Cardiovascular: Normal rate and regular rhythm.  Pulmonary/Chest: Effort normal and breath sounds normal.  Abdominal: Soft. Bowel sounds are normal. He exhibits no distension and no mass. There is no tenderness. There is no rebound and no guarding.  Musculoskeletal: He exhibits no edema.       Assessment:     #1 diffuse abdominal pain with intermittent cramping and more frequent loose stools. He does not have any red flags such as fever, bloody stools, weight loss. He's had fairly extensive GI evaluation as above.  ?small intestine overgrowth syndrome.  # 2 low vitamin D    Plan:     -consider over-the-counter probiotic such as a Electronics engineer -Avoid extra vitamin supplements -Vitamin D 4000 international units daily for 2 weeks and then decrease to 2000 international  units daily -He has follow-up for physical next week. Discussed possible GI referral at that point if abdominal symptoms not improving vs possible trial of Xifaxan.  [name] MD [facility] at [facility]

## 2018-08-16 NOTE — Patient Instructions (Signed)
Consider try Align (over the counter probiotic) daily  Consider Vit D 4,000 IU daily for 2 weeks and then decrease to 2,000 IU daily.  Decrease daily vitamins otherwise.

## 2018-08-21 ENCOUNTER — Encounter: Payer: Self-pay | Admitting: Family Medicine

## 2018-08-21 ENCOUNTER — Ambulatory Visit (INDEPENDENT_AMBULATORY_CARE_PROVIDER_SITE_OTHER): Payer: Medicare HMO | Admitting: Family Medicine

## 2018-08-21 ENCOUNTER — Other Ambulatory Visit: Payer: Self-pay

## 2018-08-21 VITALS — BP 116/76 | HR 60 | Temp 98.2°F | Resp 15 | Ht 72.0 in | Wt 222.8 lb

## 2018-08-21 DIAGNOSIS — Z0000 Encounter for general adult medical examination without abnormal findings: Secondary | ICD-10-CM

## 2018-08-21 DIAGNOSIS — E559 Vitamin D deficiency, unspecified: Secondary | ICD-10-CM

## 2018-08-21 DIAGNOSIS — Z125 Encounter for screening for malignant neoplasm of prostate: Secondary | ICD-10-CM

## 2018-08-21 DIAGNOSIS — Z23 Encounter for immunization: Secondary | ICD-10-CM

## 2018-08-21 LAB — PSA: PSA: 0.66 ng/mL (ref 0.10–4.00)

## 2018-08-21 NOTE — Patient Instructions (Signed)
Consider newer shingles vaccine (shingrix) and let us know if interested.

## 2018-08-21 NOTE — Progress Notes (Signed)
Subjective:     Patient ID: Matthew Pratt, male   DOB: 04-23-1951, 67 y.o.   MRN: [id number]  HPI Patient seen for complete physical. He just recently established care. He has had some chronic GI complaints with frequent abdominal cramping and frequent loose stools. We had suggested he try probiotic with Align. He has seen tremendous improvement with virtually full resolution of his GI symptoms. He is very happy with that overall. He's had fairly extensive GI workup in the past.  No history of hepatitis C screening. He had colonoscopy earlier this year. Last tetanus estimated over 10 years ago. He thinks he's had previous Prevnar 13 and Pneumovax. He states he had previous Zostavax.  Past Medical History:  Diagnosis Date  . Arthritis   . BPH (benign prostatic hypertrophy)   . CAD (coronary artery disease)    s/p STEMI 10/15/10 w/ Promus DES placed x1 in the first OM  . Depression   . Depression with anxiety   . GERD (gastroesophageal reflux disease)   . Glaucoma   . HLD (hyperlipidemia)   . OSA on CPAP   . Thyroid disease    Past Surgical History:  Procedure Laterality Date  . LASIK  02/1998  . TONSILLECTOMY    . VASECTOMY      reports that he has never smoked. He has never used smokeless tobacco. He reports that he does not drink alcohol or use drugs. family history includes Arthritis in his maternal grandmother and paternal grandfather; COPD in his mother and unknown relative; Cancer in his father, paternal grandfather, paternal grandmother, sister, and sister; Heart failure in his maternal grandfather; Varicose Veins in his father. Allergies  Allergen Reactions  . Iodinated Diagnostic Agents Hives    Makes PT itchy  . Rosuvastatin     Myalgia. Myalgia.   . Sulfa Antibiotics     REACTION: rash  . Sulfonamide Derivatives     REACTION: rash  . Venlafaxine     Possible serotonin syndrome.     Review of Systems  Constitutional: Negative for activity change, appetite  change, fatigue and fever.  HENT: Negative for congestion, ear pain and trouble swallowing.   Eyes: Negative for pain and visual disturbance.  Respiratory: Negative for cough, shortness of breath and wheezing.   Cardiovascular: Negative for chest pain and palpitations.  Gastrointestinal: Negative for abdominal distention, abdominal pain, blood in stool, constipation, diarrhea, nausea, rectal pain and vomiting.  Genitourinary: Negative for dysuria, hematuria and testicular pain.  Musculoskeletal: Negative for arthralgias and joint swelling.  Skin: Negative for rash.  Neurological: Negative for dizziness, syncope and headaches.  Hematological: Negative for adenopathy.  Psychiatric/Behavioral: Negative for confusion and dysphoric mood.       Objective:   Physical Exam  Constitutional: He is oriented to person, place, and time. He appears well-developed and well-nourished. No distress.  HENT:  Head: Normocephalic and atraumatic.  Right Ear: External ear normal.  Left Ear: External ear normal.  Mouth/Throat: Oropharynx is clear and moist.  Eyes: Pupils are equal, round, and reactive to light. Conjunctivae and EOM are normal.  Neck: Normal range of motion. Neck supple. No thyromegaly present.  Cardiovascular: Normal rate, regular rhythm and normal heart sounds.  No murmur heard. Pulmonary/Chest: No respiratory distress. He has no wheezes. He has no rales.  Abdominal: Soft. Bowel sounds are normal. He exhibits no distension and no mass. There is no tenderness. There is no rebound and no guarding.  Musculoskeletal: He exhibits no edema.  Lymphadenopathy:  He has no cervical adenopathy.  Neurological: He is alert and oriented to person, place, and time. He displays normal reflexes. No cranial nerve deficit.  Skin: No rash noted.  Psychiatric: He has a normal mood and affect.       Assessment:     Physical exam. Patient's had some chronic GI complaints which have virtually resolved  with probiotic supplementation. Several health maintenance issues addressed as below    Plan:     -tetanus booster given. -Discussed shingles vaccine and he will check on coverage first. -Check hepatitis C antibody -The natural history of prostate cancer and ongoing controversy regarding screening and potential treatment outcomes of prostate cancer has been discussed with the patient. The meaning of a false positive PSA and a false negative PSA has been discussed. He indicates understanding of the limitations of this screening test and wishes to proceed with screening PSA testing. -patient already had flu vaccine end of August  [name] MD [facility] at [facility]

## 2018-08-22 ENCOUNTER — Encounter: Payer: Self-pay | Admitting: Family Medicine

## 2018-08-22 LAB — HEPATITIS C ANTIBODY
Hepatitis C Ab: NONREACTIVE
SIGNAL TO CUT-OFF: 0.02 (ref <1.00)

## 2018-09-05 ENCOUNTER — Encounter: Payer: Self-pay | Admitting: Family Medicine

## 2018-09-05 ENCOUNTER — Other Ambulatory Visit: Payer: Self-pay

## 2018-09-05 ENCOUNTER — Ambulatory Visit (INDEPENDENT_AMBULATORY_CARE_PROVIDER_SITE_OTHER): Payer: Medicare HMO | Admitting: Family Medicine

## 2018-09-05 VITALS — BP 124/80 | HR 63 | Temp 97.9°F | Ht 72.0 in | Wt 222.4 lb

## 2018-09-05 DIAGNOSIS — R1084 Generalized abdominal pain: Secondary | ICD-10-CM

## 2018-09-05 DIAGNOSIS — M25562 Pain in left knee: Secondary | ICD-10-CM

## 2018-09-05 DIAGNOSIS — M25561 Pain in right knee: Secondary | ICD-10-CM | POA: Diagnosis not present

## 2018-09-05 NOTE — Patient Instructions (Signed)
I will set up GI referral.    Try recumbent bike for knee strengthening.

## 2018-09-05 NOTE — Progress Notes (Signed)
Subjective:     Patient ID: Matthew Pratt, male   DOB: 1951-08-06, 67 y.o.   MRN: [id number]  HPI Patient seen today to discuss the following  He has had some relatively chronic GI complaints for several months with more frequent bowel movements, frequent bloating, intermittent abdominal pain (fairly diffuse), loose stools.  No history of gluten sensitivity or lactose intolerance.  Previous Helicobacter pylori testing negative.  He had EGD and colonoscopy per gastroenterologist over in [facility] and these were reportedly unremarkable.  Ultrasound abdomen and pelvis unremarkable.  He was having about 3-4 loose stools per day and we had advised that he try probiotic with align.  After taking this for just a couple days his symptoms virtually resolved.  However he has had some recurrent pains over the past few days.  He is having more formed stools since starting taking this.  No appetite or weight changes.  No fever.  No bloody stools.  He would like to consider reestablishing with GI locally.  Second complaint is increased stiffness and soreness mostly posterior knees.  He states that after walking for instance he has difficulty getting down into his car because of some posterior knee pain.  He has not noted any visible swelling.  No warmth.  No erythema.  He avoids nonsteroidals.  Takes Tylenol as needed for discomfort.  He is noticed increased crepitus and increased morning stiffness in both knees in recent years.  Past Medical History:  Diagnosis Date  . Arthritis   . BPH (benign prostatic hypertrophy)   . CAD (coronary artery disease)    s/p STEMI 10/15/10 w/ Promus DES placed x1 in the first OM  . Depression   . Depression with anxiety   . GERD (gastroesophageal reflux disease)   . Glaucoma   . HLD (hyperlipidemia)   . OSA on CPAP   . Thyroid disease    Past Surgical History:  Procedure Laterality Date  . LASIK  02/1998  . TONSILLECTOMY    . VASECTOMY      reports that he  has never smoked. He has never used smokeless tobacco. He reports that he does not drink alcohol or use drugs. family history includes Arthritis in his maternal grandmother and paternal grandfather; COPD in his mother and unknown relative; Cancer in his father, paternal grandfather, paternal grandmother, sister, and sister; Heart failure in his maternal grandfather; Varicose Veins in his father. Allergies  Allergen Reactions  . Iodinated Diagnostic Agents Hives    Makes PT itchy  . Rosuvastatin     Myalgia. Myalgia.   . Sulfa Antibiotics     REACTION: rash  . Sulfonamide Derivatives     REACTION: rash  . Venlafaxine     Possible serotonin syndrome.     Review of Systems  Constitutional: Negative for appetite change, chills, fever and unexpected weight change.  Respiratory: Negative for shortness of breath.   Cardiovascular: Negative for chest pain.  Gastrointestinal: Positive for abdominal pain. Negative for blood in stool, diarrhea, nausea and vomiting.  Musculoskeletal: Positive for arthralgias.       Objective:   Physical Exam  Constitutional: He appears well-developed and well-nourished.  Cardiovascular: Normal rate and regular rhythm.  Pulmonary/Chest: Effort normal and breath sounds normal.  Abdominal: Soft. Bowel sounds are normal. He exhibits no mass. There is no tenderness. There is no rebound and no guarding.  Musculoskeletal: He exhibits no edema.  Crepitus with flexion and extension of both knees.  No effusion.  No warmth.  No localized tenderness.       Assessment:     #1 chronic several month history of abdominal symptoms including frequent loose stools, intermittent cramping, bloated feeling.  Did see some improvement with align.  Recent colonoscopy unremarkable.  No history of gluten sensitivity or lactose intolerance  #2 bilateral knee pain and stiffness.  Suspect osteoarthritis    Plan:     -We will set up GI referral with local GI.  We have previously  discussed other possibilities for his abdominal symptoms such as small intestinal bacterial overgrowth -Continue Tylenol as needed for knee discomfort.  We have suggested some exercises to strengthen his knees such as cycling.  Consider sports medicine referral if not improving with exercise.  [name] MD [facility] at [facility]

## 2018-09-06 ENCOUNTER — Telehealth: Payer: Self-pay | Admitting: Internal Medicine

## 2018-09-06 NOTE — Telephone Encounter (Signed)
Pt called back, he saw a GI at [facility] last April, had an endo colon and [place] with him but his PCP recommended to see Dr. Carlean Purl and pt also prefers to switch so all his doctors are in the [facility] system.

## 2018-09-10 NOTE — Telephone Encounter (Signed)
I will see this patient plese schedule in a new patient or follow-up slot next available

## 2018-09-14 ENCOUNTER — Encounter: Payer: Self-pay | Admitting: Internal Medicine

## 2018-09-15 NOTE — Telephone Encounter (Signed)
Appointment has been scheduled with Mr. Meroney for 10-27-18.

## 2018-10-04 ENCOUNTER — Encounter: Payer: Self-pay | Admitting: Family Medicine

## 2018-10-04 ENCOUNTER — Ambulatory Visit (INDEPENDENT_AMBULATORY_CARE_PROVIDER_SITE_OTHER): Payer: Medicare HMO | Admitting: Family Medicine

## 2018-10-04 ENCOUNTER — Other Ambulatory Visit: Payer: Self-pay

## 2018-10-04 VITALS — BP 98/58 | HR 48 | Temp 97.8°F | Ht 72.0 in | Wt 221.5 lb

## 2018-10-04 DIAGNOSIS — R1013 Epigastric pain: Secondary | ICD-10-CM | POA: Diagnosis not present

## 2018-10-04 NOTE — Patient Instructions (Signed)
Gluten-Free Diet for Celiac Disease, Adult The gluten-free diet includes all foods that do not contain gluten. Gluten is a protein that is found in wheat, rye, barley, and some other grains. Following the gluten-free diet is the only treatment for people with celiac disease. It helps to prevent damage to the intestines and improves or eliminates the symptoms of celiac disease. Following the gluten-free diet requires some planning. It can be challenging at first, but it gets easier with time and practice. There are more gluten-free options available today than ever before. If you need help finding gluten-free foods or if you have questions, talk with your diet and nutrition specialist (registered dietitian) or your health care provider. What do I need to know about a gluten-free diet?  All fruits, vegetables, and meats are safe to eat and do not contain gluten.  When grocery shopping, start by shopping in the produce, meat, and dairy sections. These sections are more likely to contain gluten-free foods. Then move to the aisles that contain packaged foods if you need to.  Read all food labels. Gluten is often added to foods. Always check the ingredient list and look for warnings, such as "may contain gluten."  Talk with your dietitian or health care provider before taking a gluten-free multivitamin or mineral supplement.  Be aware of gluten-free foods having contact with foods that contain gluten (cross-contamination). This can happen at home and with any processed foods. ? Talk with your health care provider or dietitian about how to reduce the risk of cross-contamination in your home. ? If you have questions about how a food is processed, ask the manufacturer. What key words help to identify gluten? Foods that list any of these key words on the label usually contain gluten:  Wheat, flour, enriched flour, bromated flour, white flour, durum flour, graham flour, phosphated flour, self-rising flour,  semolina, farina, barley (malt), rye, and oats.  Starch, dextrin, modified food starch, or cereal.  Thickening, fillers, or emulsifiers.  Malt flavoring, malt extract, or malt syrup.  Hydrolyzed vegetable protein.  In the [place] packaged foods that are gluten-free are required to be labeled "GF." These foods should be easy to identify and are safe to eat. In the [place] food companies are also required to list common food allergens, including wheat, on their labels. Recommended foods Grains  Amaranth, bean flours, 100% buckwheat flour, corn, millet, nut flours or nut meals, GF oats, quinoa, rice, sorghum, teff, rice wafers, pure cornmeal tortillas, popcorn, and hot cereals made from cornmeal. Hominy, rice, wild rice. Some Asian rice noodles or bean noodles. Arrowroot starch, corn bran, corn flour, corn germ, cornmeal, corn starch, potato flour, potato starch flour, and rice bran. Plain, brown, and sweet rice flours. Rice polish, soy flour, and tapioca starch. Vegetables  All plain fresh, frozen, and canned vegetables. Fruits  All plain fresh, frozen, canned, and dried fruits, and 100% fruit juices. Meats and other protein foods  All fresh beef, pork, poultry, fish, seafood, and eggs. Fish canned in water, oil, brine, or vegetable broth. Plain nuts and seeds, peanut butter. Some lunch meat and some frankfurters. Dried beans, dried peas, and lentils. Dairy  Fresh plain, dry, evaporated, or condensed milk. Cream, butter, sour cream, whipping cream, and most yogurts. Unprocessed cheese, most processed cheeses, some cottage cheese, some cream cheeses. Beverages  Coffee, tea, most herbal teas. Carbonated beverages and some root beers. Wine, sake, and distilled spirits, such as gin, vodka, and whiskey. Most hard ciders. Fats and oils  Butter,   margarine, vegetable oil, hydrogenated butter, olive oil, shortening, lard, cream, and some mayonnaise. Some commercial salad dressings. Olives. Sweets  and desserts  Sugar, honey, some syrups, molasses, jelly, and jam. Plain hard candy, marshmallows, and gumdrops. Pure cocoa powder. Plain chocolate. Custard and some pudding mixes. Gelatin desserts, sorbets, frozen ice pops, and sherbet. Cake, cookies, and other desserts prepared with allowed flours. Some commercial ice creams. Cornstarch, tapioca, and rice puddings. Seasoning and other foods  Some canned or frozen soups. Monosodium glutamate (MSG). Cider, rice, and wine vinegar. Baking soda and baking powder. Cream of tartar. Baking and nutritional yeast. Certain soy sauces made without wheat (ask your dietitian about specific brands that are allowed). Nuts, coconut, and chocolate. Salt, pepper, herbs, spices, flavoring extracts, imitation or artificial flavorings, natural flavorings, and food colorings. Some medicines and supplements. Some lip glosses and other cosmetics. Rice syrups. The items listed may not be a complete list. Talk with your dietitian about what dietary choices are best for you. Foods to avoid Grains  Barley, bran, bulgur, couscous, cracked wheat, Cliff, farro, graham, malt, matzo, semolina, wheat germ, and all wheat and rye cereals including spelt and kamut. Cereals containing malt as a flavoring, such as rice cereal. Noodles, spaghetti, macaroni, most packaged rice mixes, and all mixes containing wheat, rye, barley, or triticale. Vegetables  Most creamed vegetables and most vegetables canned in sauces. Some commercially prepared vegetables and salads. Fruits  Thickened or prepared fruits and some pie fillings. Some fruit snacks and fruit roll-ups. Meats and other protein foods  Any meat or meat alternative containing wheat, rye, barley, or gluten stabilizers. These are often marinated or packaged meats and lunch meats. Bread-containing products, such as Swiss steak, croquettes, meatballs, and meatloaf. Most tuna canned in vegetable broth and turkey with hydrolyzed vegetable  protein (HVP) injected as part of the basting. Seitan. Imitation fish. Eggs in sauces made from ingredients to avoid. Dairy  Commercial chocolate milk drinks and malted milk. Some non-dairy creamers. Any cheese product containing ingredients to avoid. Beverages  Certain cereal beverages. Beer, ale, malted milk, and some root beers. Some hard ciders. Some instant flavored coffees. Some herbal teas made with barley or with barley malt added. Fats and oils  Some commercial salad dressings. Sour cream containing modified food starch. Sweets and desserts  Some toffees. Chocolate-coated nuts (may be rolled in wheat flour) and some commercial candies and candy bars. Most cakes, cookies, donuts, pastries, and other baked goods. Some commercial ice cream. Ice cream cones. Commercially prepared mixes for cakes, cookies, and other desserts. Bread pudding and other puddings thickened with flour. Products containing brown rice syrup made with barley malt enzyme. Desserts and sweets made with malt flavoring. Seasoning and other foods  Some curry powders, some dry seasoning mixes, some gravy extracts, some meat sauces, some ketchups, some prepared mustards, and horseradish. Certain soy sauces. Malt vinegar. Bouillon and bouillon cubes that contain HVP. Some chip dips, and some chewing gum. Yeast extract. Brewer's yeast. Caramel color. Some medicines and supplements. Some lip glosses and other cosmetics. The items listed may not be a complete list. Talk with your dietitian about what dietary choices are best for you. Summary  Gluten is a protein that is found in wheat, rye, barley, and some other grains. The gluten-free diet includes all foods that do not contain gluten.  If you need help finding gluten-free foods or if you have questions, talk with your diet and nutrition specialist (registered dietitian) or your health care provider.  Read   all food labels. Gluten is often added to foods. Always check the  ingredient list and look for warnings, such as "may contain gluten." This information is not intended to replace advice given to you by your health care provider. Make sure you discuss any questions you have with your health care provider. Document Released: 11/22/2005 Document Revised: 09/06/2016 Document Reviewed: 09/06/2016 Elsevier Interactive Patient Education  2018 Elsevier Inc.  

## 2018-10-04 NOTE — Progress Notes (Signed)
Subjective:     Patient ID: Matthew Pratt, male   DOB: 03/26/1951, 67 y.o.   MRN: [id number]  HPI Patient here today with some recurrent GI complaints.  He has pending appointment with gastroenterology November 22 for new consultation.  Refer to previous notes for details.  He has history of chronic GI complaints with frequent bowel movements, frequent bloating, intermittent abdominal pain and loose stools.  Patient was at a conference couple weeks ago and had some dietary changes with eating out and also took some Aleve for leg pain started having recurrent epigastric pain.  No melena.  No hematemesis.  He stopped the Aleve and started gluten-free diet 2 weeks ago and states his symptoms are dramatically improved at this time.  He thinks he was tested for celiac with blood work years ago and negative.  Refer to note from previous dictation on 09/05/2018 for further details  "Patient seen today to discuss the following  He has had some relatively chronic GI complaints for several months with more frequent bowel movements, frequent bloating, intermittent abdominal pain (fairly diffuse), loose stools.  No history of gluten sensitivity or lactose intolerance.  Previous Helicobacter pylori testing negative.  He had EGD and colonoscopy per gastroenterologist over in [facility] and these were reportedly unremarkable.  Ultrasound abdomen and pelvis unremarkable.  He was having about 3-4 loose stools per day and we had advised that he try probiotic with align.  After taking this for just a couple days his symptoms virtually resolved.  However he has had some recurrent pains over the past few days.  He is having more formed stools since starting taking this.  No appetite or weight changes.  No fever.  No bloody stools.  He would like to consider reestablishing with GI locally."  He does not recall having biopsies for celiac in the past.  No family history of known celiac disease.  Past Medical  History:  Diagnosis Date  . Arthritis   . BPH (benign prostatic hypertrophy)   . CAD (coronary artery disease)    s/p STEMI 10/15/10 w/ Promus DES placed x1 in the first OM  . Depression   . Depression with anxiety   . GERD (gastroesophageal reflux disease)   . Glaucoma   . HLD (hyperlipidemia)   . OSA on CPAP   . Thyroid disease    Past Surgical History:  Procedure Laterality Date  . LASIK  02/1998  . TONSILLECTOMY    . VASECTOMY      reports that he has never smoked. He has never used smokeless tobacco. He reports that he does not drink alcohol or use drugs. family history includes Arthritis in his maternal grandmother and paternal grandfather; COPD in his mother and unknown relative; Cancer in his father, paternal grandfather, paternal grandmother, sister, and sister; Heart failure in his maternal grandfather; Varicose Veins in his father. Allergies  Allergen Reactions  . Iodinated Diagnostic Agents Hives    Makes PT itchy  . Rosuvastatin     Myalgia. Myalgia.   . Sulfa Antibiotics     REACTION: rash  . Sulfonamide Derivatives     REACTION: rash  . Venlafaxine     Possible serotonin syndrome.     Review of Systems  Constitutional: Negative for appetite change and unexpected weight change.  Respiratory: Negative for shortness of breath.   Gastrointestinal: Negative for abdominal distention, blood in stool, constipation, nausea and vomiting.  Genitourinary: Negative for dysuria.  Skin: Negative for rash.  Objective:   Physical Exam  Constitutional: He appears well-developed and well-nourished.  Cardiovascular: Normal rate and regular rhythm.  Pulmonary/Chest: Effort normal and breath sounds normal.  Abdominal: Normal appearance. He exhibits no distension. There is no tenderness.       Assessment:     Patient has had chronic history of dyspepsia and vague abdominal complaints as above with recent flareup which has improved dramatically with 2-week  period of gluten-free diet.  No known history of celiac disease    Plan:     -We have encouraged him to remain gluten-free since this seems to have made a difference in his symptoms past couple weeks.  He will discuss further with GI at follow-up.  We have previously considered other potential diagnoses such as small intestinal bacterial overgrowth -continue to avoid NSAIDS.  [name] MD [facility] at [facility]

## 2018-10-23 ENCOUNTER — Encounter: Payer: Self-pay | Admitting: Gastroenterology

## 2018-10-27 ENCOUNTER — Encounter: Payer: Self-pay | Admitting: Internal Medicine

## 2018-10-27 ENCOUNTER — Ambulatory Visit: Payer: Medicare HMO | Admitting: Internal Medicine

## 2018-10-27 ENCOUNTER — Other Ambulatory Visit (INDEPENDENT_AMBULATORY_CARE_PROVIDER_SITE_OTHER): Payer: Medicare HMO

## 2018-10-27 VITALS — BP 104/70 | HR 62 | Ht 74.0 in | Wt 223.0 lb

## 2018-10-27 DIAGNOSIS — D7589 Other specified diseases of blood and blood-forming organs: Secondary | ICD-10-CM | POA: Diagnosis not present

## 2018-10-27 DIAGNOSIS — K580 Irritable bowel syndrome with diarrhea: Secondary | ICD-10-CM | POA: Diagnosis not present

## 2018-10-27 DIAGNOSIS — K219 Gastro-esophageal reflux disease without esophagitis: Secondary | ICD-10-CM | POA: Diagnosis not present

## 2018-10-27 DIAGNOSIS — K9041 Non-celiac gluten sensitivity: Secondary | ICD-10-CM

## 2018-10-27 DIAGNOSIS — Z800 Family history of malignant neoplasm of digestive organs: Secondary | ICD-10-CM

## 2018-10-27 LAB — CBC WITH DIFFERENTIAL/PLATELET
BASOS PCT: 1.1 % (ref 0.0–3.0)
Basophils Absolute: 0.1 10*3/uL (ref 0.0–0.1)
EOS PCT: 5.4 % — AB (ref 0.0–5.0)
Eosinophils Absolute: 0.2 10*3/uL (ref 0.0–0.7)
HCT: 42.7 % (ref 39.0–52.0)
HEMOGLOBIN: 14.6 g/dL (ref 13.0–17.0)
Lymphocytes Relative: 32.5 % (ref 12.0–46.0)
Lymphs Abs: 1.5 10*3/uL (ref 0.7–4.0)
MCHC: 34.1 g/dL (ref 30.0–36.0)
MCV: 101.8 fl — AB (ref 78.0–100.0)
MONO ABS: 0.4 10*3/uL (ref 0.1–1.0)
Monocytes Relative: 9.9 % (ref 3.0–12.0)
Neutro Abs: 2.3 10*3/uL (ref 1.4–7.7)
Neutrophils Relative %: 51.1 % (ref 43.0–77.0)
Platelets: 194 10*3/uL (ref 150.0–400.0)
RBC: 4.2 Mil/uL — AB (ref 4.22–5.81)
RDW: 12.8 % (ref 11.5–15.5)
WBC: 4.5 10*3/uL (ref 4.0–10.5)

## 2018-10-27 LAB — VITAMIN B12: Vitamin B-12: 439 pg/mL (ref 211–911)

## 2018-10-27 NOTE — Patient Instructions (Addendum)
  Your provider has requested that you go to the basement level for lab work before leaving today. Press "B" on the elevator. The lab is located at the first door on the left as you exit the elevator.   We will get your lab work from Dr Beckie Salts for Dr Carlean Purl to review.   Look up FODMAP information online : [employer] . There is also an app for your phone.   I appreciate the opportunity to care for you. Silvano Rusk, MD, [facility]

## 2018-10-27 NOTE — Progress Notes (Addendum)
Matthew Pratt 67 y.o. 12-13-1950 [id number]  Assessment & Plan:   Encounter Diagnoses  Name Primary?  . Irritable bowel syndrome with diarrhea Yes  . NCGS (non-celiac gluten sensitivity) ?   Marland Kitchen Macrocytosis without anemia   . Gastroesophageal reflux disease without esophagitis   . Family history of colon cancer in 2 grandparents   . Hyperbilirubinemia       I think he most likely has IBS diarrhea predominant though he might have gluten sensitivity without celiac disease.  I do not think additional testing is necessary with respect to that at this time.  He is macrocytic which raises questions of possible B12 deficiency.  He does not seem to drink excessively and I do not find other causes for the macrocytosis based upon his history med list etc. at this time.  We did discuss FODMAPs diet and he will give that a try on his own at his preference.  Handout provided.  He seems to be doing fine on a gluten-free diet but I had explained that many things that you eliminate with a gluten-free diet are FODMAPs and since it does not appear that he has celiac disease may be able tolerate some gluten.  He will sort this out over time and was reassured today.  I will get records of previous celiac testing if done.  I looked through his care everywhere record and would have thought any type of celiac testing would have been there given the affiliations of his PCP.  I did not see anything.  We do know he had negative duodenal biopsies for celiac disease in 2016, again he had forgotten that work-up.  Continue omeprazole, reassured about potential side effects which are really weak associations i.e. the dementia kidney failure bone fractures  Hyperbilirubinemia noted looks indirect, did not see this on older labs probably Gilbert's question other.  May need to repeat that.  Question tie-in to macrocytosis.  With 2 grandparents having colon cancer he could be at increased risk though seems unlikely  given his overall history.  We wIll consider repeating a colonoscopy in 5 years.  Overall he appears to be very vigilant about signs and symptoms, and his health conditions.  Extensive discussions were taken today about these issues and I tried to reassure him where I could.  It is a bit curious that he did not seem to remember this work-up in 2016.  I appreciate the opportunity to care for this patient.  CC: Burchette, Alinda Sierras, MD  Old PCP records/labs obtained via Fax and like in Care everywhere no celiac testing done.    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  Between history and records review until he was evaluated in [facility] with an EGD and a colonoscopy in 2016 with negative celiac biopsies and negative random colon biopsies.  He did not recall that.  More recently he saw Dr. Earvin Hansen at [place] and had a colonoscopy in June of this year that was negative for neoplasia or inflammation.  He had been living and working in that area, actually in [place] and has moved back to [place] and wants to establish care here.  I should say he did have a diminutive benign mucosal polyp removed and normal random colon biopsies at that colonoscopy.  He was complaining of frequent postprandial  diarrhea as well.  Stool pathogen panel was negative.  He was also describing regurgitation and reflux.  He thinks his primary care, Dr. Oswaldo Milian at that time now Dr. Elease Hashimoto, has tested him for celiac disease.  He thinks it was negative.  Nevertheless gluten-free trial recently has produced much better symptoms with less bloating and diarrhea and formed stools.  He has been doing that for several months.  He was also concerned about Barrett's esophagus at one point but knows he does not have that.   Prior to his colonoscopy he had an EGD in May there was a 1 cm change at the Z line that was biopsied and negative for Barrett's esophagus and some erosive gastritis without H. pylori.  He seems to be doing reasonably well on omeprazole 20 mg daily wonders if he should need to take that still.  No dysphagia no unintentional weight loss.  No apparent lactose intolerance by history, sometimes fatty or greasy foods will bother him, he does not take an inordinate amount of artificial sweeteners.  He has had 2 grandparents with colon cancer and that has concerned him with regards to family history and increased risk so he has had colonoscopies approximately every 5 years or even more it seems. He also had an ultrasound in May 2019 showing some fatty liver otherwise negative and in 2017 had a negative HIDA scan and ultrasound of the abdomen other than the fatty liver Allergies  Allergen Reactions  . Crestor [Rosuvastatin]     Myalgia. Myalgia.   . Effexor [Venlafaxine]     Possible serotonin syndrome.  . Iodinated Diagnostic Agents Hives    Makes PT itchy  . Sulfa Antibiotics     REACTION: rash  . Sulfonamide Derivatives     REACTION: rash   Current Meds  Medication Sig  . aspirin EC 81 MG tablet Take by mouth.  . Cholecalciferol (VITAMIN D3) 2000 units TABS Take 4,000 Units by mouth daily.   Marland Kitchen DHEA 25 MG CAPS Take by mouth.  . levothyroxine (SYNTHROID, LEVOTHROID) 137 MCG tablet Take one tab po daily. BRAND NAME ONLY.  DAW/1 (Patient taking differently: Take one tab po daily. BRAND NAME ONLY.  Take half tab on Sunday and 1 tab all other days)  . omeprazole (PRILOSEC) 20 MG capsule Take 20 mg by mouth daily.  Marland Kitchen OVER THE COUNTER MEDICATION 5HTP 20 mg take one daily  For depression  . St Johns Wort 450 MG CAPS Take by mouth.  . timolol (TIMOPTIC) 0.5 % ophthalmic solution 1 drop 1 day or 1 dose.    . [DISCONTINUED] FLUAD 0.5 ML SUSY TO BE ADMINISTERED BY PHARMACIST FOR IMMUNIZATION   Past  Medical History:  Diagnosis Date  . Anxiety   . Arthritis   . BPH (benign prostatic hypertrophy)   . CAD (coronary artery disease)    s/p STEMI 10/15/10 w/ Promus DES placed x1 in the first OM  . Colon polyps   . Depression   . Depression with anxiety   . Dupuytren's contracture of hand   . ED (erectile dysfunction)   . GERD (gastroesophageal reflux disease)   . Glaucoma   . HLD (hyperlipidemia)   . Hyperthyroidism   . Hypocontractile bladder   . MI (myocardial infarction) ([place])   . OSA on CPAP   . Peyronie's disease   . Rosacea   . Skin cancer    BCC   Past Surgical History:  Procedure Laterality Date  . COLONOSCOPY WITH ESOPHAGOGASTRODUODENOSCOPY (EGD)  multiple  . CORONARY STENT PLACEMENT    . INNER EAR SURGERY    . LASIK  02/1998  . PENILE PROSTHESIS IMPLANT    . TONSILECTOMY, ADENOIDECTOMY, BILATERAL MYRINGOTOMY AND TUBES    . TONSILLECTOMY    . VASECTOMY    . WISDOM TOOTH EXTRACTION     Social History   Social History Narrative   Divorced, 2 children   Retired Chief Financial Officer   No EtOH, tobacco, drugs   family history includes Arthritis in his paternal grandfather; Basal cell carcinoma in his father and sister; COPD in his mother and unknown relative; Colon cancer in his paternal grandfather and paternal grandmother; Heart failure in his maternal grandfather; Liver cancer in his father; Melanoma in his sister; Rheum arthritis in his maternal grandmother; Varicose Veins in his father.   Review of Systems As per HPI all other review of systems negative  Objective:   Physical Exam @BP  104/70   Pulse 62   Ht 6\' 2"  (1.88 m)   Wt 223 lb (101.2 kg)   BMI 28.63 kg/m @  General:  Well-developed, well-nourished and in no acute distress, he is obese Eyes:  anicteric. ENT:   Mouth and posterior pharynx free of lesions.  Neck:   supple w/o thyromegaly or mass.  Lungs: Clear to auscultation bilaterally. Heart:  S1S2, no rubs, murmurs, gallops. Abdomen:  soft,  non-tender, no hepatosplenomegaly, hernia, or mass and BS+.  Lymph:  no cervical or supraclavicular adenopathy. Extremities:   no edema, cyanosis or clubbing Skin   no rash. Neuro:  A&O x 3.  Psych:  appropriate mood and  Affect.   Data Reviewed:  I have reviewed the [facility] records, from 2016 and 2019, I have requested the celiac studies from his primary care Dr. Oswaldo Milian, I have reviewed the images from his colonoscopy and EGD reports as well as the pathology reports.  Primary care reports from Dr. Elease Hashimoto were also reviewed.  August 2019 labs also reviewed in the EMR.  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

## 2018-10-28 ENCOUNTER — Encounter: Payer: Self-pay | Admitting: Internal Medicine

## 2018-10-28 DIAGNOSIS — Z800 Family history of malignant neoplasm of digestive organs: Secondary | ICD-10-CM | POA: Insufficient documentation

## 2018-10-28 DIAGNOSIS — K9041 Non-celiac gluten sensitivity: Secondary | ICD-10-CM | POA: Insufficient documentation

## 2018-10-28 DIAGNOSIS — K580 Irritable bowel syndrome with diarrhea: Secondary | ICD-10-CM | POA: Insufficient documentation

## 2018-11-05 NOTE — Progress Notes (Signed)
Labs ok except macrocytosis

## 2018-11-14 ENCOUNTER — Other Ambulatory Visit (INDEPENDENT_AMBULATORY_CARE_PROVIDER_SITE_OTHER): Payer: Medicare HMO

## 2018-11-14 DIAGNOSIS — E559 Vitamin D deficiency, unspecified: Secondary | ICD-10-CM | POA: Diagnosis not present

## 2018-11-14 LAB — VITAMIN D 25 HYDROXY (VIT D DEFICIENCY, FRACTURES): VITD: 28.8 ng/mL — AB (ref 30.00–100.00)

## 2018-11-20 ENCOUNTER — Other Ambulatory Visit: Payer: Self-pay

## 2018-11-20 DIAGNOSIS — E559 Vitamin D deficiency, unspecified: Secondary | ICD-10-CM

## 2018-11-22 ENCOUNTER — Telehealth: Payer: Self-pay

## 2018-11-22 NOTE — Telephone Encounter (Signed)
-----   Message from Gatha Mayer, MD sent at 11/21/2018  6:55 PM EST ----- Regarding: No celiac testing done Please tell him records from old PCP obtained - the patient was not tested for celiac disease but I do not think that is necessary  If he wants to we can but needs to be eating gluten for 2 weeks or more if we test for it to be accurate

## 2018-11-22 NOTE — Telephone Encounter (Signed)
I spoke with Matthew Pratt and he said he went back on gluten because he loves bread and butter. Then he had pain so he's gone back to a gluten free diet and he's much better. He said he doesn't want to be tested. He has purchased the FODMAP APP and will start this after the holidays. He will call us back if he has any problems.

## 2018-12-20 ENCOUNTER — Ambulatory Visit: Payer: Medicare HMO | Admitting: Family Medicine

## 2019-01-26 DIAGNOSIS — R339 Retention of urine, unspecified: Secondary | ICD-10-CM | POA: Diagnosis not present

## 2019-01-29 ENCOUNTER — Other Ambulatory Visit: Payer: Self-pay

## 2019-01-29 ENCOUNTER — Ambulatory Visit (INDEPENDENT_AMBULATORY_CARE_PROVIDER_SITE_OTHER): Payer: Medicare Other | Admitting: Family Medicine

## 2019-01-29 ENCOUNTER — Encounter: Payer: Self-pay | Admitting: Family Medicine

## 2019-01-29 VITALS — BP 120/84 | HR 47 | Temp 97.8°F | Ht 74.0 in | Wt 223.3 lb

## 2019-01-29 DIAGNOSIS — F339 Major depressive disorder, recurrent, unspecified: Secondary | ICD-10-CM

## 2019-01-29 MED ORDER — VENLAFAXINE HCL ER 37.5 MG PO CP24: 37.5000 mg | ORAL_CAPSULE | Freq: Every day | ORAL | 0 refills | Status: DC

## 2019-01-29 NOTE — Patient Instructions (Signed)
Living With Depression  Everyone experiences occasional disappointment, sadness, and loss in their lives. When you are feeling down, blue, or sad for at least 2 weeks in a row, it may mean that you have depression. Depression can affect your thoughts and feelings, relationships, daily activities, and physical health. It is caused by changes in the way your brain functions. If you receive a diagnosis of depression, your health care provider will tell you which type of depression you have and what treatment options are available to you.  If you are living with depression, there are ways to help you recover from it and also ways to prevent it from coming back.  How to cope with lifestyle changes  Coping with stress         Stress is your body's reaction to life changes and events, both good and bad. Stressful situations may include:   Getting married.   The death of a spouse.   Losing a job.   Retiring.   Having a baby.  Stress can last just a few hours or it can be ongoing. Stress can play a major role in depression, so it is important to learn both how to cope with stress and how to think about it differently.  Talk with your health care provider or a counselor if you would like to learn more about stress reduction. He or she may suggest some stress reduction techniques, such as:   Music therapy. This can include creating music or listening to music. Choose music that you enjoy and that inspires you.   Mindfulness-based meditation. This kind of meditation can be done while sitting or walking. It involves being aware of your normal breaths, rather than trying to control your breathing.   Centering prayer. This is a kind of meditation that involves focusing on a spiritual word or phrase. Choose a word, phrase, or sacred image that is meaningful to you and that brings you peace.   Deep breathing. To do this, expand your stomach and inhale slowly through your nose. Hold your breath for 3-5 seconds, then exhale  slowly, allowing your stomach muscles to relax.   Muscle relaxation. This involves intentionally tensing muscles then relaxing them.  Choose a stress reduction technique that fits your lifestyle and personality. Stress reduction techniques take time and practice to develop. Set aside 5-15 minutes a day to do them. Therapists can offer training in these techniques. The training may be covered by some insurance plans. Other things you can do to manage stress include:   Keeping a stress diary. This can help you learn what triggers your stress and ways to control your response.   Understanding what your limits are and saying no to requests or events that lead to a schedule that is too full.   Thinking about how you respond to certain situations. You may not be able to control everything, but you can control how you react.   Adding humor to your life by watching funny films or TV shows.   Making time for activities that help you relax and not feeling guilty about spending your time this way.    Medicines  Your health care provider may suggest certain medicines if he or she feels that they will help improve your condition. Avoid using alcohol and other substances that may prevent your medicines from working properly (may interact). It is also important to:   Talk with your pharmacist or health care provider about all the medicines that you take, their   possible side effects, and what medicines are safe to take together.   Make it your goal to take part in all treatment decisions (shared decision-making). This includes giving input on the side effects of medicines. It is best if shared decision-making with your health care provider is part of your total treatment plan.  If your health care provider prescribes a medicine, you may not notice the full benefits of it for 4-8 weeks. Most people who are treated for depression need to be on medicine for at least 6-12 months after they feel better. If you are taking  medicines as part of your treatment, do not stop taking medicines without first talking to your health care provider. You may need to have the medicine slowly decreased (tapered) over time to decrease the risk of harmful side effects.  Relationships  Your health care provider may suggest family therapy along with individual therapy and drug therapy. While there may not be family problems that are causing you to feel depressed, it is still important to make sure your family learns as much as they can about your mental health. Having your family's support can help make your treatment successful.  How to recognize changes in your condition  Everyone has a different response to treatment for depression. Recovery from major depression happens when you have not had signs of major depression for two months. This may mean that you will start to:   Have more interest in doing activities.   Feel less hopeless than you did 2 months ago.   Have more energy.   Overeat less often, or have better or improving appetite.   Have better concentration.  Your health care provider will work with you to decide the next steps in your recovery. It is also important to recognize when your condition is getting worse. Watch for these signs:   Having fatigue or low energy.   Eating too much or too little.   Sleeping too much or too little.   Feeling restless, agitated, or hopeless.   Having trouble concentrating or making decisions.   Having unexplained physical complaints.   Feeling irritable, angry, or aggressive.  Get help as soon as you or your family members notice these symptoms coming back.  How to get support and help from others  How to talk with friends and family members about your condition    Talking to friends and family members about your condition can provide you with one way to get support and guidance. Reach out to trusted friends or family members, explain your symptoms to them, and let them know that you are  working with a health care provider to treat your depression.  Financial resources  Not all insurance plans cover mental health care, so it is important to check with your insurance carrier. If paying for co-pays or counseling services is a problem, search for a local or county mental health care center. They may be able to offer public mental health care services at low or no cost when you are not able to see a private health care provider.  If you are taking medicine for depression, you may be able to get the generic form, which may be less expensive. Some makers of prescription medicines also offer help to patients who cannot afford the medicines they need.  Follow these instructions at home:     Get the right amount and quality of sleep.   Cut down on using caffeine, tobacco, alcohol, and other potentially harmful substances.     Try to exercise, such as walking or lifting small weights.   Take over-the-counter and prescription medicines only as told by your health care provider.   Eat a healthy diet that includes plenty of vegetables, fruits, whole grains, low-fat dairy products, and lean protein. Do not eat a lot of foods that are high in solid fats, added sugars, or salt.   Keep all follow-up visits as told by your health care provider. This is important.  Contact a health care provider if:   You stop taking your antidepressant medicines, and you have any of these symptoms:  ? Nausea.  ? Headache.  ? Feeling lightheaded.  ? Chills and body aches.  ? Not being able to sleep (insomnia).   You or your friends and family think your depression is getting worse.  Get help right away if:   You have thoughts of hurting yourself or others.  If you ever feel like you may hurt yourself or others, or have thoughts about taking your own life, get help right away. You can go to your nearest emergency department or call:   Your local emergency services (911 in the [place]   A suicide crisis helpline, such as the  National Suicide Prevention Lifeline at 1-800-273-8255. This is open 24-hours a day.  Summary   If you are living with depression, there are ways to help you recover from it and also ways to prevent it from coming back.   Work with your health care team to create a management plan that includes counseling, stress management techniques, and healthy lifestyle habits.  This information is not intended to replace advice given to you by your health care provider. Make sure you discuss any questions you have with your health care provider.  Document Released: 10/25/2016 Document Revised: 10/25/2016 Document Reviewed: 10/25/2016  Elsevier Interactive Patient Education  2019 [employer] Inc[employer]

## 2019-01-29 NOTE — Progress Notes (Signed)
Subjective:     Patient ID: Matthew Pratt, male   DOB: Aug 07, 1951, 68 y.o.   MRN: [id number]  HPI  Patient is seen for follow-up regarding recurrent depression.  He has been on multiple medications in the past.  He cannot recall the exact response with some of these.  Apparently was on SSRIs as well as Cymbalta, Trintellix, and Wellbutrin and none of those worked very well or had side effects.  He has taken venlafaxine which had worked fairly well  He initially had venlafaxine listed as an allergy but he has not had any allergy-like symptoms.  Apparently at high dose of 150 mg he had some increased chest symptoms and wondered if he was developing "serotonin syndrome".  This was not very clear at all from the history.  He did not recall any hypertensive response or other typical type symptoms of serotonin syndrome.  He relates several week if not month history of some progressive depression symptoms.  He actually had Medulla with good response for about a year and went back and was reevaluated and decided not to do any recurrent TMS treatments.  He has not been on any antidepressants for well over a year.  Denies any suicidal ideation.  He had some progressive issues with decreased interest, fatigue, low motivation, mild cognitive impairment, poor sleep quality.  He is getting regular counseling  Past Medical History:  Diagnosis Date  . Anxiety   . Arthritis   . BPH (benign prostatic hypertrophy)   . CAD (coronary artery disease)    s/p STEMI 10/15/10 w/ Promus DES placed x1 in the first OM  . Colon polyps   . Depression   . Depression with anxiety   . Dupuytren's contracture of hand   . ED (erectile dysfunction)   . GERD (gastroesophageal reflux disease)   . Glaucoma   . HLD (hyperlipidemia)   . Hyperthyroidism   . Hypocontractile bladder   . MI (myocardial infarction) ([place])   . OSA on CPAP   . Peyronie's disease   . Rosacea   . Skin cancer    BCC   Past Surgical History:   Procedure Laterality Date  . COLONOSCOPY WITH ESOPHAGOGASTRODUODENOSCOPY (EGD)     multiple  . CORONARY STENT PLACEMENT    . INNER EAR SURGERY    . LASIK  02/1998  . PENILE PROSTHESIS IMPLANT    . TONSILECTOMY, ADENOIDECTOMY, BILATERAL MYRINGOTOMY AND TUBES    . TONSILLECTOMY    . VASECTOMY    . WISDOM TOOTH EXTRACTION      reports that he has never smoked. He has never used smokeless tobacco. He reports that he does not drink alcohol or use drugs. family history includes Arthritis in his paternal grandfather; Basal cell carcinoma in his father and sister; COPD in his mother and another family member; Colon cancer in his paternal grandfather and paternal grandmother; Heart failure in his maternal grandfather; Liver cancer in his father; Melanoma in his sister; Rheum arthritis in his maternal grandmother; Varicose Veins in his father. Allergies  Allergen Reactions  . Crestor [Rosuvastatin]     Myalgia. Myalgia.   . Iodinated Diagnostic Agents Hives    Makes PT itchy  . Sulfa Antibiotics     REACTION: rash  . Sulfonamide Derivatives     REACTION: rash    Review of Systems  Constitutional: Positive for fatigue. Negative for appetite change, chills, fever and unexpected weight change.  Respiratory: Negative for shortness of breath.   Cardiovascular: Negative for  chest pain.  Gastrointestinal: Negative for abdominal pain.  Neurological: Negative for dizziness and syncope.  Psychiatric/Behavioral: Positive for dysphoric mood and sleep disturbance. Negative for agitation, confusion and suicidal ideas.       Objective:   Physical Exam Constitutional:      Appearance: He is well-developed.  HENT:     Right Ear: External ear normal.     Left Ear: External ear normal.  Eyes:     Pupils: Pupils are equal, round, and reactive to light.  Neck:     Musculoskeletal: Neck supple.     Thyroid: No thyromegaly.  Cardiovascular:     Rate and Rhythm: Normal rate and regular rhythm.   Pulmonary:     Effort: Pulmonary effort is normal. No respiratory distress.     Breath sounds: Normal breath sounds. No wheezing or rales.  Neurological:     Mental Status: He is alert and oriented to person, place, and time.  Psychiatric:     Comments: PHQ 9 score of 18        Assessment:     Patient presents with history of recurrent depression with exacerbation starting several months ago.  No suicidal ideation    Plan:     -Continue with regular counseling -Discussed nonpharmacologic factors that can to improve his depression such as exercise -Start back Effexor XR 37.5 mg once daily and reassess in 3 to 4 weeks.  Follow-up sooner as needed  [name] MD [facility]

## 2019-02-13 ENCOUNTER — Encounter: Payer: Self-pay | Admitting: Family Medicine

## 2019-02-20 ENCOUNTER — Other Ambulatory Visit: Payer: Self-pay | Admitting: Family Medicine

## 2019-02-26 ENCOUNTER — Ambulatory Visit (INDEPENDENT_AMBULATORY_CARE_PROVIDER_SITE_OTHER): Payer: Medicare Other | Admitting: Family Medicine

## 2019-02-26 ENCOUNTER — Telehealth: Payer: Self-pay

## 2019-02-26 ENCOUNTER — Encounter: Payer: Self-pay | Admitting: Family Medicine

## 2019-02-26 DIAGNOSIS — J069 Acute upper respiratory infection, unspecified: Secondary | ICD-10-CM

## 2019-02-26 DIAGNOSIS — F331 Major depressive disorder, recurrent, moderate: Secondary | ICD-10-CM | POA: Diagnosis not present

## 2019-02-26 MED ORDER — VENLAFAXINE HCL ER 75 MG PO CP24: 75.0000 mg | ORAL_CAPSULE | Freq: Every day | ORAL | 3 refills | Status: DC

## 2019-02-26 NOTE — Telephone Encounter (Signed)
Patient had WebEx visit set up with Dr. Elease Hashimoto and I will call patient back to schedule his 1 month follow up with Dr. Elease Hashimoto.

## 2019-02-26 NOTE — Progress Notes (Signed)
Patient ID: Matthew Pratt, male   DOB: 1951/02/22, 68 y.o.   MRN: [id number]  Virtual Visit via Video Note  I connected with [name] on 02/26/19 at  9:45 AM EDT by a video enabled telemedicine application and verified that I am speaking with the correct person using two identifiers.  Location patient: home Location provider:work or home office Persons participating in the virtual visit: patient, provider  I discussed the limitations of evaluation and management by telemedicine and the availability of in person appointments. The patient expressed understanding and agreed to proceed.   HPI: Follow-up regarding major depression, recurrent episode.  Recent PHQ 9 score of 18.  Previous intolerance with multiple antidepressants.  Patient had taken Effexor extended release for several years with fairly good success.  He had recent [place] therapy and was reluctant to start off with higher dose of stimulant type medication.  We started Effexor XR 37.5 mg daily which he is tolerating well.  However, he has not seen much improvement depression symptoms.  Still has mood swings and difficulty sleeping and low energy and difficulty concentrating.  Loss of interest in activities.  No suicidal ideation.  Still getting regular counseling and is scheduled to see his counselor tomorrow  Patient also had recent URI symptoms.  At this point he feels he is fully recovered.  No cough.  No fever.  No recent sick contacts.  No dyspnea.   ROS: See pertinent positives and negatives per HPI.  Past Medical History:  Diagnosis Date  . Anxiety   . Arthritis   . BPH (benign prostatic hypertrophy)   . CAD (coronary artery disease)    s/p STEMI 10/15/10 w/ Promus DES placed x1 in the first OM  . Colon polyps   . Depression   . Depression with anxiety   . Dupuytren's contracture of hand   . ED (erectile dysfunction)   . GERD (gastroesophageal reflux disease)   . Glaucoma   . HLD (hyperlipidemia)   .  Hyperthyroidism   . Hypocontractile bladder   . MI (myocardial infarction) ([place])   . OSA on CPAP   . Peyronie's disease   . Rosacea   . Skin cancer    BCC    Past Surgical History:  Procedure Laterality Date  . COLONOSCOPY WITH ESOPHAGOGASTRODUODENOSCOPY (EGD)     multiple  . CORONARY STENT PLACEMENT    . INNER EAR SURGERY    . LASIK  02/1998  . PENILE PROSTHESIS IMPLANT    . TONSILECTOMY, ADENOIDECTOMY, BILATERAL MYRINGOTOMY AND TUBES    . TONSILLECTOMY    . VASECTOMY    . WISDOM TOOTH EXTRACTION      Family History  Problem Relation Age of Onset  . COPD Other   . COPD Mother   . Varicose Veins Father   . Liver cancer Father   . Basal cell carcinoma Father   . Basal cell carcinoma Sister   . Rheum arthritis Maternal Grandmother   . Heart failure Maternal Grandfather   . Colon cancer Paternal Grandmother   . Arthritis Paternal Grandfather   . Colon cancer Paternal Grandfather   . Melanoma Sister     SOCIAL HX: non-smoker . No regular ETOH.   Current Outpatient Medications:  .  aspirin EC 81 MG tablet, Take by mouth., Disp: , Rfl:  .  b complex vitamins capsule, Take by mouth., Disp: , Rfl:  .  Cholecalciferol (VITAMIN D3) 2000 units TABS, Take 4,000 Units by mouth daily. , Disp: ,  Rfl:  .  DHEA 25 MG CAPS, Take by mouth., Disp: , Rfl:  .  levothyroxine (SYNTHROID, LEVOTHROID) 137 MCG tablet, Take one tab po daily. BRAND NAME ONLY.  DAW/1 (Patient taking differently: Take one tab po daily. BRAND NAME ONLY.  Take half tab on Sunday and 1 tab all other days), Disp: 90 tablet, Rfl: 3 .  Multiple Vitamin (MULTIVITAMIN) tablet, Take by mouth., Disp: , Rfl:  .  omeprazole (PRILOSEC) 20 MG capsule, Take 20 mg by mouth daily., Disp: , Rfl:  .  OVER THE COUNTER MEDICATION, 5HTP 20 mg take one daily  For depression, Disp: , Rfl:  .  silodosin (RAPAFLO) 8 MG CAPS capsule, Take by mouth., Disp: , Rfl:  .  St Johns Wort 450 MG CAPS, Take by mouth., Disp: , Rfl:  .  timolol  (TIMOPTIC) 0.5 % ophthalmic solution, 1 drop 1 day or 1 dose.  , Disp: , Rfl:  .  venlafaxine XR (EFFEXOR-XR) 37.5 MG 24 hr capsule, TAKE 1 CAPSULE BY MOUTH DAILY WITH BREAKFAST., Disp: 90 capsule, Rfl: 0  EXAM:  VITALS per patient if applicable:  GENERAL: alert, oriented, appears well and in no acute distress  HEENT: atraumatic, conjunttiva clear, no obvious abnormalities on inspection of external nose and ears  NECK: normal movements of the head and neck  LUNGS: on inspection no signs of respiratory distress, breathing rate appears normal, no obvious gross SOB, gasping or wheezing  CV: no obvious cyanosis  MS: moves all visible extremities without noticeable abnormality  PSYCH/NEURO: pleasant and cooperative, no obvious depression or anxiety, speech and thought processing grossly intact.  Repeat PHQ-9 score of 17 compared with 18 a month ago  ASSESSMENT AND PLAN:  Discussed the following assessment and plan:  #1 major depressive episode, recurrent of moderate severity Increase Effexor XR to 75 mg once daily and reassess in 1 month   #2 recent viral URI symptomatically improved     I discussed the assessment and treatment plan with the patient. The patient was provided an opportunity to ask questions and all were answered. The patient agreed with the plan and demonstrated an understanding of the instructions.   The patient was advised to call back or seek an in-person evaluation if the symptoms worsen or if the condition fails to improve as anticipated.     [name], MD

## 2019-02-26 NOTE — Telephone Encounter (Signed)
Called patient to let him know that we need to do a telephone visit today instead of him coming in to the office. Left a detailed phone message to call us back to let us know that he received the message and to make sure he will be available at that time to receive the call from Dr. Elease Hashimoto.  OK for [facility] to discuss/schedule patient.  CRM Created.

## 2019-02-26 NOTE — Progress Notes (Deleted)
  Subjective:     Patient ID: Matthew Pratt, male   DOB: [birth year], 68 y.o.   MRN: [id number]  HPI   Review of Systems     Objective:   Physical Exam     Assessment:     ***    Plan:     ***

## 2019-02-26 NOTE — Telephone Encounter (Signed)
Patient called back that he is ok with a telephone visit. Telephone number and email address was verified. Thank you.

## 2019-03-28 DIAGNOSIS — R339 Retention of urine, unspecified: Secondary | ICD-10-CM | POA: Diagnosis not present

## 2019-03-29 ENCOUNTER — Other Ambulatory Visit: Payer: Self-pay

## 2019-03-29 ENCOUNTER — Other Ambulatory Visit (INDEPENDENT_AMBULATORY_CARE_PROVIDER_SITE_OTHER): Payer: Medicare Other

## 2019-03-29 DIAGNOSIS — E559 Vitamin D deficiency, unspecified: Secondary | ICD-10-CM | POA: Diagnosis not present

## 2019-03-29 LAB — VITAMIN D 25 HYDROXY (VIT D DEFICIENCY, FRACTURES): VITD: 47.44 ng/mL (ref 30.00–100.00)

## 2019-03-30 ENCOUNTER — Encounter: Payer: Self-pay | Admitting: Family Medicine

## 2019-04-11 ENCOUNTER — Encounter: Payer: Self-pay | Admitting: Family Medicine

## 2019-05-16 ENCOUNTER — Other Ambulatory Visit: Payer: Self-pay | Admitting: Family Medicine

## 2019-05-24 ENCOUNTER — Encounter: Payer: Self-pay | Admitting: Family Medicine

## 2019-05-29 ENCOUNTER — Other Ambulatory Visit: Payer: Self-pay

## 2019-05-29 ENCOUNTER — Encounter: Payer: Self-pay | Admitting: Family Medicine

## 2019-05-29 ENCOUNTER — Ambulatory Visit (INDEPENDENT_AMBULATORY_CARE_PROVIDER_SITE_OTHER): Payer: Medicare Other | Admitting: Family Medicine

## 2019-05-29 DIAGNOSIS — M79662 Pain in left lower leg: Secondary | ICD-10-CM | POA: Diagnosis not present

## 2019-05-29 DIAGNOSIS — M79661 Pain in right lower leg: Secondary | ICD-10-CM

## 2019-05-29 NOTE — Progress Notes (Signed)
Patient ID: Matthew Pratt, male   DOB: Jul 02, 1951, 68 y.o.   MRN: [id number]   This visit type was conducted due to national recommendations for restrictions regarding the COVID-19 pandemic in an effort to limit this patient's exposure and mitigate transmission in our community.   Virtual Visit via Video Note  I connected with [name] on 05/29/19 at  2:15 PM EDT by a video enabled telemedicine application and verified that I am speaking with the correct person using two identifiers.  Location patient: home Location provider:work or home office Persons participating in the virtual visit: patient, provider  I discussed the limitations of evaluation and management by telemedicine and the availability of in person appointments. The patient expressed understanding and agreed to proceed.   HPI: Patient had called regarding bilateral calf pain.  He is done a lot of yard work trying to help his ex-wife who had recently broken both ankles.  He denies any specific injury.  He has a achy sensation which involves most of his calf.  Does not localize pain.  No sharp pain.  No weakness.  No numbness.  No sciatica symptoms.  He has not had any asymmetric swelling.  No dyspnea.  No pleuritic pain.  No claudication symptoms.   ROS: See pertinent positives and negatives per HPI.  Past Medical History:  Diagnosis Date  . Anxiety   . Arthritis   . BPH (benign prostatic hypertrophy)   . CAD (coronary artery disease)    s/p STEMI 10/15/10 w/ Promus DES placed x1 in the first OM  . Colon polyps   . Depression   . Depression with anxiety   . Dupuytren's contracture of hand   . ED (erectile dysfunction)   . GERD (gastroesophageal reflux disease)   . Glaucoma   . HLD (hyperlipidemia)   . Hyperthyroidism   . Hypocontractile bladder   . MI (myocardial infarction) ([facility])   . OSA on CPAP   . Peyronie's disease   . Rosacea   . Skin cancer    BCC    Past Surgical History:  Procedure Laterality  Date  . COLONOSCOPY WITH ESOPHAGOGASTRODUODENOSCOPY (EGD)     multiple  . CORONARY STENT PLACEMENT    . INNER EAR SURGERY    . LASIK  02/1998  . PENILE PROSTHESIS IMPLANT    . TONSILECTOMY, ADENOIDECTOMY, BILATERAL MYRINGOTOMY AND TUBES    . TONSILLECTOMY    . VASECTOMY    . WISDOM TOOTH EXTRACTION      Family History  Problem Relation Age of Onset  . COPD Other   . COPD Mother   . Varicose Veins Father   . Liver cancer Father   . Basal cell carcinoma Father   . Basal cell carcinoma Sister   . Rheum arthritis Maternal Grandmother   . Heart failure Maternal Grandfather   . Colon cancer Paternal Grandmother   . Arthritis Paternal Grandfather   . Colon cancer Paternal Grandfather   . Melanoma Sister     SOCIAL HX: Non-smoker   Current Outpatient Medications:  .  aspirin EC 81 MG tablet, Take by mouth., Disp: , Rfl:  .  b complex vitamins capsule, Take by mouth., Disp: , Rfl:  .  Cholecalciferol (VITAMIN D3) 2000 units TABS, Take 4,000 Units by mouth daily. , Disp: , Rfl:  .  DHEA 25 MG CAPS, Take by mouth., Disp: , Rfl:  .  levothyroxine (SYNTHROID, LEVOTHROID) 137 MCG tablet, Take one tab po daily. BRAND NAME ONLY.  DAW/1 (Patient taking differently: Take one tab po daily. BRAND NAME ONLY.  Take half tab on Sunday and 1 tab all other days), Disp: 90 tablet, Rfl: 3 .  Multiple Vitamin (MULTIVITAMIN) tablet, Take by mouth., Disp: , Rfl:  .  omeprazole (PRILOSEC) 20 MG capsule, Take 20 mg by mouth daily., Disp: , Rfl:  .  OVER THE COUNTER MEDICATION, 5HTP 20 mg take one daily  For depression, Disp: , Rfl:  .  silodosin (RAPAFLO) 8 MG CAPS capsule, Take by mouth., Disp: , Rfl:  .  St Johns Wort 450 MG CAPS, Take by mouth., Disp: , Rfl:  .  timolol (TIMOPTIC) 0.5 % ophthalmic solution, 1 drop 1 day or 1 dose.  , Disp: , Rfl:  .  venlafaxine XR (EFFEXOR XR) 75 MG 24 hr capsule, Take 1 capsule (75 mg total) by mouth daily with breakfast., Disp: 90 capsule, Rfl: 3  EXAM:  VITALS  per patient if applicable:  GENERAL: alert, oriented, appears well and in no acute distress  HEENT: atraumatic, conjunttiva clear, no obvious abnormalities on inspection of external nose and ears  NECK: normal movements of the head and neck  LUNGS: on inspection no signs of respiratory distress, breathing rate appears normal, no obvious gross SOB, gasping or wheezing  CV: no obvious cyanosis  MS: moves all visible extremities without noticeable abnormality  PSYCH/NEURO: pleasant and cooperative, no obvious depression or anxiety, speech and thought processing grossly intact  ASSESSMENT AND PLAN:  Discussed the following assessment and plan:  Bilateral calf pain.  By description this sounds more muscular.  He does not have evidence clinically to suggest likely DVT, Achilles tendinitis, muscle cramps, or claudication  -We suggest that he do some toe raises to try to strengthen his calves and also stretch out his Achilles -Observe for now and office visit for any persistent or worsening symptoms     I discussed the assessment and treatment plan with the patient. The patient was provided an opportunity to ask questions and all were answered. The patient agreed with the plan and demonstrated an understanding of the instructions.   The patient was advised to call back or seek an in-person evaluation if the symptoms worsen or if the condition fails to improve as anticipated.   [name], MD

## 2019-06-11 ENCOUNTER — Telehealth: Payer: Medicare Other | Admitting: Physician Assistant

## 2019-06-11 ENCOUNTER — Encounter: Payer: Self-pay | Admitting: Family Medicine

## 2019-06-11 ENCOUNTER — Encounter: Payer: Self-pay | Admitting: Physician Assistant

## 2019-06-11 ENCOUNTER — Encounter (INDEPENDENT_AMBULATORY_CARE_PROVIDER_SITE_OTHER): Payer: Self-pay

## 2019-06-11 DIAGNOSIS — J029 Acute pharyngitis, unspecified: Secondary | ICD-10-CM

## 2019-06-11 DIAGNOSIS — R0789 Other chest pain: Secondary | ICD-10-CM

## 2019-06-11 DIAGNOSIS — Z20822 Contact with and (suspected) exposure to covid-19: Secondary | ICD-10-CM

## 2019-06-11 DIAGNOSIS — R52 Pain, unspecified: Secondary | ICD-10-CM

## 2019-06-11 DIAGNOSIS — R0602 Shortness of breath: Secondary | ICD-10-CM

## 2019-06-11 MED ORDER — ALBUTEROL SULFATE HFA 108 (90 BASE) MCG/ACT IN AERS: 2.0000 | INHALATION_SPRAY | RESPIRATORY_TRACT | 0 refills | Status: DC | PRN

## 2019-06-11 NOTE — Progress Notes (Signed)
E-Visit for Corona Virus Screening   Your current symptoms could be consistent with the coronavirus.  Call your health care provider or local health department to request and arrange formal testing. Many health care providers can now test patients at their office but not all are.  Please quarantine yourself while awaiting your test results.  [place] [phone], [place], [place] [phone] or visit [email or web address]  and You have been enrolled in [facility] for COVID-19.  Daily you will receive a questionnaire within the [facility] website. Our COVID-19 response team will be monitoring your responses daily.    COVID-19 is a respiratory illness with symptoms that are similar to the flu. Symptoms are typically mild to moderate, but there have been cases of severe illness and death due to the virus. The following symptoms may appear 2-14 days after exposure: . Fever . Cough . Shortness of breath or difficulty breathing . Chills . Repeated shaking with chills . Muscle pain . Headache . Sore throat . New loss of taste or smell . Fatigue . Congestion or runny nose . Nausea or vomiting . Diarrhea  It is vitally important that if you feel that you have an infection such as this virus or any other virus that you stay home and away from places where you may spread it to others.  You should self-quarantine for 14 days if you have symptoms that could potentially be coronavirus or have been in close contact a with a person diagnosed with COVID-19 within the last 2 weeks. You should avoid contact with people age 62 and older.   You should wear a mask or cloth face covering over your nose and mouth if you must be around other people or animals, including pets (even at home). Try to stay at least 6 feet away from other people. This will protect the  people around you.  You can use medication such as A prescription inhaler called Albuterol MDI 90 mcg /actuation 2 puffs every 4 hours as needed for shortness of breath, wheezing, cough  You may also take acetaminophen (Tylenol) as needed for fever.  Mr. Mickie Bail stated that the discomfort in your chest is related to your recent fall. If pain in the chest continues, worsens, or if you develop any new symptoms, please follow up with your doctor or at an urgent care center.   Reduce your risk of any infection by using the same precautions used for avoiding the common cold or flu:  Marland Kitchen Wash your hands often with soap and warm water for at least 20 seconds.  If soap and water are not readily available, use an alcohol-based hand sanitizer with at least 60% alcohol.  . If coughing or sneezing, cover your mouth and nose by coughing or sneezing into the elbow areas of your shirt or coat, into a tissue or into your sleeve (not your hands). . Avoid shaking hands with others and consider head nods or verbal greetings only. . Avoid touching your eyes, nose, or mouth with unwashed hands.  . Avoid close contact with people who are sick. . Avoid places or events with large numbers of people in one location, like concerts or sporting events. . Carefully consider travel plans you have or are making. . If you are planning any travel outside or inside the [place], visit the CDC's Travelers' Health webpage for the latest health notices. . If you have some symptoms but not all symptoms, continue  to monitor at home and seek medical attention if your symptoms worsen. . If you are having a medical emergency, call 911.  HOME CARE . Only take medications as instructed by your medical team. . Drink plenty of fluids and get plenty of rest. . A steam or ultrasonic humidifier can help if you have congestion.   GET HELP RIGHT AWAY IF YOU HAVE EMERGENCY WARNING SIGNS** FOR COVID-19. If you or someone is showing any of these  signs seek emergency medical care immediately. Call 911 or proceed to your closest emergency facility if: . You develop worsening high fever. . Trouble breathing . Bluish lips or face . Persistent pain or pressure in the chest . New confusion . Inability to wake or stay awake . You cough up blood. . Your symptoms become more severe  **This list is not all possible symptoms. Contact your medical provider for any symptoms that are sever or concerning to you.   MAKE SURE YOU   Understand these instructions.  Will watch your condition.  Will get help right away if you are not doing well or get worse.  Your e-visit answers were reviewed by a board certified advanced clinical practitioner to complete your personal care plan.  Depending on the condition, your plan could have included both over the counter or prescription medications.  If there is a problem please reply once you have received a response from your provider.  Your safety is important to [place].  If you have drug allergies check your prescription carefully.    You can use MyChart to ask questions about today's visit, request a non-urgent call back, or ask for a work or school excuse for 24 hours related to this e-Visit. If it has been greater than 24 hours you will need to follow up with your provider, or enter a new e-Visit to address those concerns. You will get an e-mail in the next two days asking about your experience.  I hope that your e-visit has been valuable and will speed your recovery. Thank you for using e-visits.   I spent 5-10 minutes on review and completion of this note- Lacy Duverney [facility]

## 2019-06-12 ENCOUNTER — Encounter (INDEPENDENT_AMBULATORY_CARE_PROVIDER_SITE_OTHER): Payer: Self-pay

## 2019-06-12 ENCOUNTER — Telehealth: Payer: Self-pay | Admitting: *Deleted

## 2019-06-12 DIAGNOSIS — Z20822 Contact with and (suspected) exposure to covid-19: Secondary | ICD-10-CM

## 2019-06-12 NOTE — Telephone Encounter (Signed)
Contacted pt due to my chart encounter response of diarrhea; he had 5-6 episodes of "runny" diarrhea;  Recommendations made: WATER: Even for severe diarrhea, water is often the best liquid to drink. You should also eat some salty foods (e.g., potato chips, pretzels, saltine crackers). This is important to make sure you are getting enough salt, sugars, and fluids to meet your body's needs.  * SPORTS DRINKS: You can also drink a sports drink (e.g., Gatorade, Powerade) to help treat and prevent dehydration. For it to work best, mix it half and half with water.  * Avoid caffeinated beverages (Reason: caffeine is mildly dehydrating).  * Avoid alcohol beverages (beer, wine, hard liquor).    3: FOOD AND NUTRITION DURING SEVERE DIARRHEA:  * Drinking enough liquids is more important that eating when one has severe diarrhea.  * As the diarrhea starts to get better, you can slowly return to a normal diet.  * Begin with boiled starches / cereals (e.g., potatoes, rice, noodles, wheat, oats) with a small amount of salt to taste.  * Other foods that are OK include: bananas, yogurt, crackers, soup.   4: DIARRHEA MEDICINE - Loperamide (Imodium AD):  * This medicine helps decrease diarrhea. It is available over-the-counter (OTC) in a drug store.  * Adult dosage: 4 mg (2 capsules) is the recommended first dose. You may take an additional 2 mg (1 capsule) after each loose BM.  * Maximum dosage: 16 mg per day (8 capsules).  * Do not use for more than 2 days.   5: CAUTION - Loperamide (Imodium AD):  * DO NOT use if there is a fever over 100.5 F (38.1 C) or if there is blood or mucus in the stools.  * Read and follow the package instructions carefully.   6: DIARRHEA MEDICINE - Bismuth Subsalicylate (e.g., Kaopectate, Pepto-Bismol):  * This medicine can help reduce diarrhea, vomiting, and abdominal cramping. It is available over-the-counter (OTC) in a drug store.  * Adult dosage: Take two tablets or two tablespoons by  mouth every hour (if diarrhea continues) to a maximum of 8 doses in a 24 hour period.  * Do not use for more than 2 days.   7: CAUTION - Bismuth Subsalicylate (e.g., Kaopectate, Pepto-Bismol):  * May cause a temporary darkening of stool and tongue.  * Do not use if allergic to aspirin.  * Do not use in pregnancy.  * Read and follow the package instructions carefully.   8: CONTAGIOUSNESS:  * Be certain to wash your hands after using the restroom.  * If your work is cooking, Research scientist (medical), serving or preparing food, then you should not work until the diarrhea has completely stopped.   9: CALL BACK IF:  * Signs of dehydration occur (e.g., no urine over 12 hours, very dry mouth, lightheaded, etc.)  * Bloody stools  * Constant or severe abdominal pain  * You become worse.  Pt also accepted appointment for COVID testing at [facility] site, 06/13/2019 at 0900; pt given address, location, and instructions that he and all occupants of his vehicle should wear masks; he verbalized understanding; orders placed per protocol.

## 2019-06-12 NOTE — Telephone Encounter (Signed)
Agree with advice given- and Covid testing

## 2019-06-13 ENCOUNTER — Encounter (INDEPENDENT_AMBULATORY_CARE_PROVIDER_SITE_OTHER): Payer: Self-pay

## 2019-06-13 ENCOUNTER — Telehealth: Payer: Self-pay | Admitting: *Deleted

## 2019-06-13 ENCOUNTER — Other Ambulatory Visit: Payer: Medicare Other

## 2019-06-13 DIAGNOSIS — R6889 Other general symptoms and signs: Secondary | ICD-10-CM | POA: Diagnosis not present

## 2019-06-13 DIAGNOSIS — Z20822 Contact with and (suspected) exposure to covid-19: Secondary | ICD-10-CM

## 2019-06-13 NOTE — Telephone Encounter (Signed)
Contacted pt due to my chart companion responses 06/13/2019 of worsening weakness; pt disconnected call; will again attempt to contact.

## 2019-06-13 NOTE — Telephone Encounter (Signed)
Agree with symptomatic management unless he develops any dyspnea, recurrent vomiting, or other concerns.

## 2019-06-13 NOTE — Telephone Encounter (Signed)
Contacted pt he says that his temp is elevated, and he has generalized aches and pains; his temp was 99.3 today; recommendations:  ACETAMINOPHEN (E.G., TYLENOL):  1,000 mg (two 500 mg pills) every 8 hours as needed. Each Extra Strength Tylenol pill has 500 mg of acetaminophen. The most you should take each day is 3,000 mg (6 Extra Strength pills a day); pt also advised to contact PCP for further recommendations; he verbalized understanding.

## 2019-06-14 ENCOUNTER — Encounter (INDEPENDENT_AMBULATORY_CARE_PROVIDER_SITE_OTHER): Payer: Self-pay

## 2019-06-15 ENCOUNTER — Encounter (INDEPENDENT_AMBULATORY_CARE_PROVIDER_SITE_OTHER): Payer: Self-pay

## 2019-06-16 ENCOUNTER — Encounter (INDEPENDENT_AMBULATORY_CARE_PROVIDER_SITE_OTHER): Payer: Self-pay

## 2019-06-17 ENCOUNTER — Encounter (INDEPENDENT_AMBULATORY_CARE_PROVIDER_SITE_OTHER): Payer: Self-pay

## 2019-06-18 ENCOUNTER — Encounter (INDEPENDENT_AMBULATORY_CARE_PROVIDER_SITE_OTHER): Payer: Self-pay

## 2019-06-18 LAB — NOVEL CORONAVIRUS, NAA: SARS-CoV-2, NAA: NOT DETECTED

## 2019-06-19 ENCOUNTER — Encounter (INDEPENDENT_AMBULATORY_CARE_PROVIDER_SITE_OTHER): Payer: Self-pay

## 2019-06-20 ENCOUNTER — Encounter (INDEPENDENT_AMBULATORY_CARE_PROVIDER_SITE_OTHER): Payer: Self-pay

## 2019-06-20 ENCOUNTER — Encounter: Payer: Self-pay | Admitting: Family Medicine

## 2019-06-21 ENCOUNTER — Encounter (INDEPENDENT_AMBULATORY_CARE_PROVIDER_SITE_OTHER): Payer: Self-pay

## 2019-06-22 ENCOUNTER — Encounter (INDEPENDENT_AMBULATORY_CARE_PROVIDER_SITE_OTHER): Payer: Self-pay

## 2019-06-23 ENCOUNTER — Encounter (INDEPENDENT_AMBULATORY_CARE_PROVIDER_SITE_OTHER): Payer: Self-pay

## 2019-06-24 ENCOUNTER — Encounter (INDEPENDENT_AMBULATORY_CARE_PROVIDER_SITE_OTHER): Payer: Self-pay

## 2019-08-01 DIAGNOSIS — H401131 Primary open-angle glaucoma, bilateral, mild stage: Secondary | ICD-10-CM | POA: Diagnosis not present

## 2019-08-05 DIAGNOSIS — R21 Rash and other nonspecific skin eruption: Secondary | ICD-10-CM | POA: Diagnosis not present

## 2019-08-06 ENCOUNTER — Encounter: Payer: Self-pay | Admitting: Family Medicine

## 2019-08-06 ENCOUNTER — Ambulatory Visit (INDEPENDENT_AMBULATORY_CARE_PROVIDER_SITE_OTHER): Payer: Medicare Other | Admitting: Family Medicine

## 2019-08-06 ENCOUNTER — Other Ambulatory Visit: Payer: Self-pay

## 2019-08-06 VITALS — BP 130/70 | HR 54 | Temp 97.6°F | Ht 74.0 in | Wt 217.5 lb

## 2019-08-06 DIAGNOSIS — R21 Rash and other nonspecific skin eruption: Secondary | ICD-10-CM

## 2019-08-06 DIAGNOSIS — D692 Other nonthrombocytopenic purpura: Secondary | ICD-10-CM | POA: Diagnosis not present

## 2019-08-06 NOTE — Progress Notes (Signed)
Subjective:     Patient ID: Matthew Pratt, male   DOB: November 18, 1951, 68 y.o.   MRN: [id number]  HPI   Patient is seen with skin rash which started on his right leg and now is spreading somewhat including left lower leg and also some on his right trunk area near the waistline.  He initially thought this may have been shingles.  He has not any associated pain.  Does have some pruritus.  He went to urgent care yesterday and was given apparently Depo-Medrol injection along with some oral prednisone.  He has not had any fever or chills.  No dyspnea.  No cough.  No abdominal pain.  Denies any other GI symptoms such as nausea, vomiting.  No history of similar rash.  Denies any headaches.  Denies any easy bruising or bleeding.  No recent new medications.  Past Medical History:  Diagnosis Date  . Anxiety   . Arthritis   . BPH (benign prostatic hypertrophy)   . CAD (coronary artery disease)    s/p STEMI 10/15/10 w/ Promus DES placed x1 in the first OM  . Colon polyps   . Depression   . Depression with anxiety   . Dupuytren's contracture of hand   . ED (erectile dysfunction)   . GERD (gastroesophageal reflux disease)   . Glaucoma   . HLD (hyperlipidemia)   . Hyperthyroidism   . Hypocontractile bladder   . MI (myocardial infarction) ([place])   . OSA on CPAP   . Peyronie's disease   . Rosacea   . Skin cancer    BCC   Past Surgical History:  Procedure Laterality Date  . COLONOSCOPY WITH ESOPHAGOGASTRODUODENOSCOPY (EGD)     multiple  . CORONARY STENT PLACEMENT    . INNER EAR SURGERY    . LASIK  02/1998  . PENILE PROSTHESIS IMPLANT    . TONSILECTOMY, ADENOIDECTOMY, BILATERAL MYRINGOTOMY AND TUBES    . TONSILLECTOMY    . VASECTOMY    . WISDOM TOOTH EXTRACTION      reports that he has never smoked. He has never used smokeless tobacco. He reports that he does not drink alcohol or use drugs. family history includes Arthritis in his paternal grandfather; Basal cell carcinoma in his father  and sister; COPD in his mother and another family member; Colon cancer in his paternal grandfather and paternal grandmother; Heart failure in his maternal grandfather; Liver cancer in his father; Melanoma in his sister; Rheum arthritis in his maternal grandmother; Varicose Veins in his father. Allergies  Allergen Reactions  . Crestor [Rosuvastatin]     Myalgia. Myalgia.   . Iodinated Diagnostic Agents Hives    Makes PT itchy  . Sulfa Antibiotics     REACTION: rash  . Sulfonamide Derivatives     REACTION: rash     Review of Systems  Constitutional: Negative for chills and fever.  Respiratory: Negative for shortness of breath.   Cardiovascular: Negative for chest pain.  Gastrointestinal: Negative for abdominal pain, blood in stool, diarrhea, nausea and vomiting.  Genitourinary: Negative for dysuria and hematuria.  Skin: Positive for rash.  Neurological: Negative for weakness.  Hematological: Negative for adenopathy. Does not bruise/bleed easily.       Objective:   Physical Exam Constitutional:      General: He is not in acute distress.    Appearance: Normal appearance. He is not ill-appearing or toxic-appearing.  Neck:     Musculoskeletal: Neck supple.  Cardiovascular:     Rate and Rhythm:  Normal rate and regular rhythm.  Pulmonary:     Effort: Pulmonary effort is normal.     Breath sounds: Normal breath sounds.  Abdominal:     Palpations: Abdomen is soft.     Tenderness: There is no abdominal tenderness.  Musculoskeletal:     Right lower leg: No edema.     Left lower leg: No edema.  Skin:    Findings: Rash present.     Comments: He has nonblanching erythematous rash around the waist area mostly on the right side.  These appear to be more petechial.  He has tattered somewhat purplish colored slightly raised lesions on the legs right leg greater than left  Neurological:     Mental Status: He is alert.        Assessment:     Purpuric type skin lesions mostly lower  extremities.  He does not have any evidence for systemic toxicity such as fever or chills.  He states these came about a week ago. ?vasculitic    Plan:     -Check further labs with CBC, comprehensive metabolic panel, INR, PTT, urinalysis -Low threshold for skin biopsy -Patient was placed on steroids yesterday per urgent care -Follow-up immediately for any fever, progressive rash, or other concerns  Matthew Post MD [facility] at [facility]

## 2019-08-07 ENCOUNTER — Encounter: Payer: Self-pay | Admitting: Family Medicine

## 2019-08-07 LAB — CBC WITH DIFFERENTIAL/PLATELET
Basophils Absolute: 0.1 10*3/uL (ref 0.0–0.1)
Basophils Relative: 0.7 % (ref 0.0–3.0)
Eosinophils Absolute: 0 10*3/uL (ref 0.0–0.7)
Eosinophils Relative: 0.1 % (ref 0.0–5.0)
HCT: 40.8 % (ref 39.0–52.0)
Hemoglobin: 14.1 g/dL (ref 13.0–17.0)
Lymphocytes Relative: 7.5 % — ABNORMAL LOW (ref 12.0–46.0)
Lymphs Abs: 0.6 10*3/uL — ABNORMAL LOW (ref 0.7–4.0)
MCHC: 34.6 g/dL (ref 30.0–36.0)
MCV: 104.2 fl — ABNORMAL HIGH (ref 78.0–100.0)
Monocytes Absolute: 0.1 10*3/uL (ref 0.1–1.0)
Monocytes Relative: 1.5 % — ABNORMAL LOW (ref 3.0–12.0)
Neutro Abs: 6.7 10*3/uL (ref 1.4–7.7)
Neutrophils Relative %: 90.2 % — ABNORMAL HIGH (ref 43.0–77.0)
Platelets: 194 10*3/uL (ref 150.0–400.0)
RBC: 3.92 Mil/uL — ABNORMAL LOW (ref 4.22–5.81)
RDW: 12.8 % (ref 11.5–15.5)
WBC: 7.4 10*3/uL (ref 4.0–10.5)

## 2019-08-07 LAB — COMPREHENSIVE METABOLIC PANEL
ALT: 31 U/L (ref 0–53)
AST: 29 U/L (ref 0–37)
Albumin: 4.7 g/dL (ref 3.5–5.2)
Alkaline Phosphatase: 60 U/L (ref 39–117)
BUN: 16 mg/dL (ref 6–23)
CO2: 30 mEq/L (ref 19–32)
Calcium: 10.1 mg/dL (ref 8.4–10.5)
Chloride: 100 mEq/L (ref 96–112)
Creatinine, Ser: 1 mg/dL (ref 0.40–1.50)
GFR: 73.77 mL/min (ref >60.00)
Glucose, Bld: 128 mg/dL — ABNORMAL HIGH (ref 70–99)
Potassium: 4.7 mEq/L (ref 3.5–5.1)
Sodium: 139 mEq/L (ref 135–145)
Total Bilirubin: 0.7 mg/dL (ref 0.2–1.2)
Total Protein: 7.3 g/dL (ref 6.0–8.3)

## 2019-08-07 LAB — PROTIME-INR
INR: 1.1 ratio — ABNORMAL HIGH (ref 0.8–1.0)
Prothrombin Time: 12.5 s (ref 9.6–13.1)

## 2019-08-07 LAB — IGA: IgA: 183 mg/dL (ref 68–378)

## 2019-08-07 LAB — APTT: aPTT: 24.3 s (ref 23.4–32.7)

## 2019-08-08 ENCOUNTER — Ambulatory Visit: Payer: Medicare Other | Admitting: Family Medicine

## 2019-08-10 ENCOUNTER — Other Ambulatory Visit: Payer: Self-pay | Admitting: Family Medicine

## 2019-08-10 ENCOUNTER — Other Ambulatory Visit: Payer: Self-pay

## 2019-08-10 ENCOUNTER — Ambulatory Visit (INDEPENDENT_AMBULATORY_CARE_PROVIDER_SITE_OTHER): Payer: Medicare Other

## 2019-08-10 DIAGNOSIS — Z23 Encounter for immunization: Secondary | ICD-10-CM

## 2019-08-10 NOTE — Progress Notes (Signed)
Per orders of Dr [name], injection of PNEUMOVAX 23 given by Wyvonne Lenz. Patient tolerated injection well.

## 2019-08-15 ENCOUNTER — Encounter: Payer: Self-pay | Admitting: Family Medicine

## 2019-08-19 ENCOUNTER — Other Ambulatory Visit: Payer: Self-pay | Admitting: Family Medicine

## 2019-08-21 ENCOUNTER — Encounter: Payer: Self-pay | Admitting: Family Medicine

## 2019-08-21 ENCOUNTER — Other Ambulatory Visit: Payer: Self-pay

## 2019-08-21 ENCOUNTER — Ambulatory Visit (INDEPENDENT_AMBULATORY_CARE_PROVIDER_SITE_OTHER): Payer: Medicare Other | Admitting: Family Medicine

## 2019-08-21 VITALS — BP 110/68 | HR 69 | Temp 97.6°F | Ht 74.0 in | Wt 212.0 lb

## 2019-08-21 DIAGNOSIS — L309 Dermatitis, unspecified: Secondary | ICD-10-CM | POA: Diagnosis not present

## 2019-08-21 DIAGNOSIS — R21 Rash and other nonspecific skin eruption: Secondary | ICD-10-CM

## 2019-08-21 NOTE — Progress Notes (Signed)
Subjective:     Patient ID: Matthew Pratt, male   DOB: Jun 13, 1951, 68 y.o.   MRN: [id number]  HPI Patient is here for consideration for skin biopsy.  He had viral type illness back in early August followed by rash lower extremities.  This was nonblanching slightly raised and  slightly pruritic and slightly purpuric.  We obtained several labs including CBC, comprehensive chemistries, PT, PTT, IgA and these were all normal.  He did not have any abdominal pain or any fever.  Other than minimal pruritus no other symptoms with the rash.  No headaches.  Has been applying topical steroid cream.  Past Medical History:  Diagnosis Date  . Anxiety   . Arthritis   . BPH (benign prostatic hypertrophy)   . CAD (coronary artery disease)    s/p STEMI 10/15/10 w/ Promus DES placed x1 in the first OM  . Colon polyps   . Depression   . Depression with anxiety   . Dupuytren's contracture of hand   . ED (erectile dysfunction)   . GERD (gastroesophageal reflux disease)   . Glaucoma   . HLD (hyperlipidemia)   . Hyperthyroidism   . Hypocontractile bladder   . MI (myocardial infarction) ([place])   . OSA on CPAP   . Peyronie's disease   . Rosacea   . Skin cancer    BCC   Past Surgical History:  Procedure Laterality Date  . COLONOSCOPY WITH ESOPHAGOGASTRODUODENOSCOPY (EGD)     multiple  . CORONARY STENT PLACEMENT    . INNER EAR SURGERY    . LASIK  02/1998  . PENILE PROSTHESIS IMPLANT    . TONSILECTOMY, ADENOIDECTOMY, BILATERAL MYRINGOTOMY AND TUBES    . TONSILLECTOMY    . VASECTOMY    . WISDOM TOOTH EXTRACTION      reports that he has never smoked. He has never used smokeless tobacco. He reports that he does not drink alcohol or use drugs. family history includes Arthritis in his paternal grandfather; Basal cell carcinoma in his father and sister; COPD in his mother and another family member; Colon cancer in his paternal grandfather and paternal grandmother; Heart failure in his maternal  grandfather; Liver cancer in his father; Melanoma in his sister; Rheum arthritis in his maternal grandmother; Varicose Veins in his father. Allergies  Allergen Reactions  . Crestor [Rosuvastatin]     Myalgia. Myalgia.   . Iodinated Diagnostic Agents Hives    Makes PT itchy  . Sulfa Antibiotics     REACTION: rash  . Sulfonamide Derivatives     REACTION: rash       Review of Systems  Constitutional: Negative for appetite change, chills, fatigue, fever and unexpected weight change.  Respiratory: Negative for cough and shortness of breath.   Cardiovascular: Negative for chest pain.  Gastrointestinal: Negative for abdominal pain.  Skin: Positive for rash.  Neurological: Negative for headaches.  Hematological: Negative for adenopathy.       Objective:   Physical Exam Constitutional:      Appearance: Normal appearance.  Cardiovascular:     Rate and Rhythm: Normal rate and regular rhythm.  Pulmonary:     Effort: Pulmonary effort is normal.     Breath sounds: Normal breath sounds.  Musculoskeletal:     Right lower leg: No edema.     Left lower leg: No edema.  Skin:    Findings: Rash present.     Comments: He has a rash that seems to be fading somewhat lower extremities and to  lesser extent right side of waist.  This is erythematous and nonblanching.  Nonscaly.  Fairly well demarcated border  Neurological:     Mental Status: He is alert.        Assessment:     Skin rash which is predominantly lower extremities which seems to be fading.  Question vasculitic.  This initially had more of a petechial type appearance.  Recent labs were reassuring.    Plan:     -Even though the rash seems to be improving patient wished to proceed with punch biopsy which we had discussed previously.  We discussed risk including risk of bleeding, infection, bruising and patient consented.  Skin prepped with Betadine.  Local anesthesia with 1% Xylocaine with epinephrine.  Obtained punch biopsy  with 4 mm punch.  Minimal bleeding.  Wound closed with 2 sutures of 4-0 Ethilon Topical antibiotic and dressing applied -Wound care instruction given.  Follow-up for any signs of infection. -Specimen sent to [facility] pathology for further evaluation  [name] MD [facility] at [facility]

## 2019-08-21 NOTE — Patient Instructions (Signed)
Keep wound dry for the first 24 hours then clean daily with soap and water for one week. Apply vaseline daily for 3-4 days. Keep covered with clean dressing for 4-5 days. Follow up promptly for any signs of infection such as redness, warmth, pain, or drainage. Return in one week for suture removal.

## 2019-08-25 ENCOUNTER — Encounter: Payer: Self-pay | Admitting: Family Medicine

## 2019-08-27 ENCOUNTER — Other Ambulatory Visit: Payer: Self-pay

## 2019-08-27 ENCOUNTER — Telehealth: Payer: Self-pay

## 2019-08-27 NOTE — Telephone Encounter (Signed)
Called patient and was going to see if he wanted me to send to the [facility] pharmacy for his request through his MyChart for the Venlafaxine 75 mg refill for 7 days but he stated that the [employer] delivery did come today and he did not need the 7 day refill after all.

## 2019-08-28 ENCOUNTER — Ambulatory Visit (INDEPENDENT_AMBULATORY_CARE_PROVIDER_SITE_OTHER): Payer: Medicare Other | Admitting: Family Medicine

## 2019-08-28 ENCOUNTER — Other Ambulatory Visit: Payer: Self-pay

## 2019-08-28 ENCOUNTER — Encounter: Payer: Self-pay | Admitting: Family Medicine

## 2019-08-28 VITALS — BP 118/78 | HR 45 | Temp 98.0°F | Ht 74.0 in | Wt 210.3 lb

## 2019-08-28 DIAGNOSIS — E039 Hypothyroidism, unspecified: Secondary | ICD-10-CM | POA: Diagnosis not present

## 2019-08-28 DIAGNOSIS — L308 Other specified dermatitis: Secondary | ICD-10-CM

## 2019-08-28 LAB — TSH: TSH: 1.55 u[IU]/mL (ref 0.35–4.50)

## 2019-08-28 NOTE — Progress Notes (Signed)
Subjective:     Patient ID: Matthew Pratt, male   DOB: 06-20-1951, 68 y.o.   MRN: [id number]  HPI Here for suture removal and to discuss recent biopsy.  He had biopsy of nonblanching rash lower extremities.  We are trying to rule out vasculitic process.  Fortunately his rash is fading.  No evidence for vasculitis.  This showed a spongiotic type dermatitis picture.  No recent new medications.  No fungus.  Biopsy site healing well.  Patient has hypothyroidism and is due for follow-up TSH.  Only other regular medication is Effexor.  His mood disorder is stable.  He has lost a few pounds due to his efforts at weight loss.  Overall feels well  Past Medical History:  Diagnosis Date  . Anxiety   . Arthritis   . BPH (benign prostatic hypertrophy)   . CAD (coronary artery disease)    s/p STEMI 10/15/10 w/ Promus DES placed x1 in the first OM  . Colon polyps   . Depression   . Depression with anxiety   . Dupuytren's contracture of hand   . ED (erectile dysfunction)   . GERD (gastroesophageal reflux disease)   . Glaucoma   . HLD (hyperlipidemia)   . Hyperthyroidism   . Hypocontractile bladder   . MI (myocardial infarction) ([place])   . OSA on CPAP   . Peyronie's disease   . Rosacea   . Skin cancer    BCC   Past Surgical History:  Procedure Laterality Date  . COLONOSCOPY WITH ESOPHAGOGASTRODUODENOSCOPY (EGD)     multiple  . CORONARY STENT PLACEMENT    . INNER EAR SURGERY    . LASIK  02/1998  . PENILE PROSTHESIS IMPLANT    . TONSILECTOMY, ADENOIDECTOMY, BILATERAL MYRINGOTOMY AND TUBES    . TONSILLECTOMY    . VASECTOMY    . WISDOM TOOTH EXTRACTION      reports that he has never smoked. He has never used smokeless tobacco. He reports that he does not drink alcohol or use drugs. family history includes Arthritis in his paternal grandfather; Basal cell carcinoma in his father and sister; COPD in his mother and another family member; Colon cancer in his paternal grandfather and paternal  grandmother; Heart failure in his maternal grandfather; Liver cancer in his father; Melanoma in his sister; Rheum arthritis in his maternal grandmother; Varicose Veins in his father. Allergies  Allergen Reactions  . Crestor [Rosuvastatin]     Myalgia. Myalgia.   . Iodinated Diagnostic Agents Hives    Makes PT itchy  . Sulfa Antibiotics     REACTION: rash  . Sulfonamide Derivatives     REACTION: rash     Review of Systems  Constitutional: Negative for appetite change, chills, fever and unexpected weight change.  Respiratory: Negative for shortness of breath.   Cardiovascular: Negative for chest pain.  Endocrine: Negative for cold intolerance and heat intolerance.  Psychiatric/Behavioral: Negative for dysphoric mood.       Objective:   Physical Exam Constitutional:      Appearance: Normal appearance.  Cardiovascular:     Rate and Rhythm: Normal rate and regular rhythm.  Pulmonary:     Effort: Pulmonary effort is normal.     Breath sounds: Normal breath sounds.  Skin:    Findings: Rash present.     Comments: Patient has fading rash lower extremities.  Nonblanching.  Nonscaly.  Biopsy site left calf is healing well.  2 sutures removed.  Minimal surrounding bruising.  No erythema.  Neurological:     Mental Status: He is alert.        Assessment:     #1 recent lower extremity rash which appears to be more eczematous related-and is improving.  Vasculitic process ruled out by biopsy  #2 hypothyroidism-overdue for labs      Plan:     -Recheck TSH -2 sutures removed from biopsy site and this is healing well -We discussed his path report in some detail. -Consider topical steroid for any recurrent similar rash.  [name] MD [facility] at [facility]

## 2019-09-04 ENCOUNTER — Encounter: Payer: Self-pay | Admitting: Family Medicine

## 2019-09-30 ENCOUNTER — Other Ambulatory Visit: Payer: Self-pay | Admitting: Family Medicine

## 2019-10-29 ENCOUNTER — Other Ambulatory Visit: Payer: Self-pay

## 2019-10-30 ENCOUNTER — Ambulatory Visit: Payer: Medicare Other | Admitting: Family Medicine

## 2019-11-12 ENCOUNTER — Other Ambulatory Visit: Payer: Self-pay

## 2019-11-13 ENCOUNTER — Encounter: Payer: Self-pay | Admitting: Family Medicine

## 2019-11-13 ENCOUNTER — Ambulatory Visit (INDEPENDENT_AMBULATORY_CARE_PROVIDER_SITE_OTHER): Payer: Medicare Other | Admitting: Family Medicine

## 2019-11-13 ENCOUNTER — Other Ambulatory Visit: Payer: Self-pay

## 2019-11-13 VITALS — BP 108/68 | HR 48 | Temp 97.3°F | Ht 74.0 in | Wt 210.6 lb

## 2019-11-13 DIAGNOSIS — L570 Actinic keratosis: Secondary | ICD-10-CM | POA: Diagnosis not present

## 2019-11-13 DIAGNOSIS — F341 Dysthymic disorder: Secondary | ICD-10-CM

## 2019-11-13 DIAGNOSIS — R238 Other skin changes: Secondary | ICD-10-CM | POA: Diagnosis not present

## 2019-11-13 MED ORDER — VENLAFAXINE HCL ER 37.5 MG PO CP24: ORAL_CAPSULE | ORAL | 3 refills | Status: DC

## 2019-11-13 NOTE — Patient Instructions (Signed)
Consider Head and Shoulders shampoo with conditioner of Nizoral Shampoo

## 2019-11-13 NOTE — Progress Notes (Addendum)
Subjective:     Patient ID: Matthew Pratt, male   DOB: 1951/07/23, 68 y.o.   MRN: [id number]  HPI [name] is seen for the following issues  He states that since he was in his 53s he has had frequent intermittent scalp irritation.  He states that when his hair gets longer he has worse symptoms.  He has frequent itching and sometimes flaking.  He has noticed some red pimple-like bumps in the past.  He has a known history of rosacea.  He also has frequent scaly places that he sometimes scrape off.  He has tried things like tea tree oil without much improvement.  He was told in the past that he has had some actinic keratoses.  He is inquiring about types of shampoo to use for the itching.  He does use sun protection with sunblock and caps/or hats  Other issue is he has history of some recurrent depression.  He sees counselor regularly.  Counselor advised consideration and titration of Effexor.  He is currently on 75 mg daily and would like to try 112.5.  No suicidal ideation.  Past Medical History:  Diagnosis Date  . Anxiety   . Arthritis   . BPH (benign prostatic hypertrophy)   . CAD (coronary artery disease)    s/p STEMI 10/15/10 w/ Promus DES placed x1 in the first OM  . Colon polyps   . Depression   . Depression with anxiety   . Dupuytren's contracture of hand   . ED (erectile dysfunction)   . GERD (gastroesophageal reflux disease)   . Glaucoma   . HLD (hyperlipidemia)   . Hyperthyroidism   . Hypocontractile bladder   . MI (myocardial infarction) ([place])   . OSA on CPAP   . Peyronie's disease   . Rosacea   . Skin cancer    BCC   Past Surgical History:  Procedure Laterality Date  . COLONOSCOPY WITH ESOPHAGOGASTRODUODENOSCOPY (EGD)     multiple  . CORONARY STENT PLACEMENT    . INNER EAR SURGERY    . LASIK  02/1998  . PENILE PROSTHESIS IMPLANT    . TONSILECTOMY, ADENOIDECTOMY, BILATERAL MYRINGOTOMY AND TUBES    . TONSILLECTOMY    . VASECTOMY    . WISDOM TOOTH EXTRACTION       reports that he has never smoked. He has never used smokeless tobacco. He reports that he does not drink alcohol or use drugs. family history includes Arthritis in his paternal grandfather; Basal cell carcinoma in his father and sister; COPD in his mother and another family member; Colon cancer in his paternal grandfather and paternal grandmother; Heart failure in his maternal grandfather; Liver cancer in his father; Melanoma in his sister; Rheum arthritis in his maternal grandmother; Varicose Veins in his father. Allergies  Allergen Reactions  . Crestor [Rosuvastatin]     Myalgia. Myalgia.   . Iodinated Diagnostic Agents Hives    Makes PT itchy  . Sulfa Antibiotics     REACTION: rash  . Sulfonamide Derivatives     REACTION: rash     Review of Systems  Constitutional: Negative for chills and fever.  Respiratory: Negative for shortness of breath.   Cardiovascular: Negative for chest pain.  Psychiatric/Behavioral: Positive for dysphoric mood. Negative for suicidal ideas.       Objective:   Physical Exam Vitals signs reviewed.  Constitutional:      Appearance: Normal appearance.  Cardiovascular:     Rate and Rhythm: Normal rate and regular rhythm.  Skin:    Comments: He has rough to palpation slightly elevated of whitish actinic keratosis right frontal area.  This is approximately 2 x 2 mm.  No ulceration.  He has a few areas of telangiectasia on the scalp but very little scaling or obvious dryness.  No pustules.  Neurological:     Mental Status: He is alert.        Assessment:     #1 scalp irritation.  He does have some mild dryness likely by history but no evidence for obvious seborrhea or other active dermatitis changes today  #2 small actinic keratosis right forehead  #3 history of recurrent depression    Plan:     -We will increase Effexor to 37.5 mg 3 daily or 112.5 mg daily with new prescription sent in.  He will continue with close follow-up with his  counselor  -We recommended he try shampoo such as Nizoral or possibly head and shoulders with conditioner  -We discussed other possible prescription products that might help with his itching including Elocon lotion, Olux, or Capex shampoo but explained these would all likely be fairly costly.  He would like to try above shampoos first  -Discussed risk and benefits of treatment of actinic keratosis with liquid nitrogen including risk of pain, blistering, and very low risk for scarring.  Patient consented.  One actinic keratosis lesion was treated right forehead and patient tolerated well  -Continue with good sun protection with hats and sunblock  [name] MD [facility] at [facility]

## 2019-11-21 ENCOUNTER — Encounter: Payer: Self-pay | Admitting: Family Medicine

## 2020-01-03 ENCOUNTER — Other Ambulatory Visit: Payer: Self-pay

## 2020-01-04 ENCOUNTER — Telehealth (INDEPENDENT_AMBULATORY_CARE_PROVIDER_SITE_OTHER): Payer: Medicare Other | Admitting: Family Medicine

## 2020-01-04 DIAGNOSIS — R110 Nausea: Secondary | ICD-10-CM | POA: Diagnosis not present

## 2020-01-04 DIAGNOSIS — R5383 Other fatigue: Secondary | ICD-10-CM

## 2020-01-04 DIAGNOSIS — I2510 Atherosclerotic heart disease of native coronary artery without angina pectoris: Secondary | ICD-10-CM

## 2020-01-04 DIAGNOSIS — R6889 Other general symptoms and signs: Secondary | ICD-10-CM

## 2020-01-04 MED ORDER — OMEPRAZOLE 10 MG PO CPDR: 10.0000 mg | DELAYED_RELEASE_CAPSULE | Freq: Every day | ORAL | 3 refills | Status: DC

## 2020-01-04 NOTE — Progress Notes (Signed)
This visit type was conducted due to national recommendations for restrictions regarding the COVID-19 pandemic in an effort to limit this patient's exposure and mitigate transmission in our community.   Virtual Visit via Video Note  I connected with Matthew Pratt on 01/04/20 at 11:00 AM EST by a video enabled telemedicine application and verified that I am speaking with the correct person using two identifiers.  Location patient: home Location provider:work or home office Persons participating in the virtual visit: patient, provider  I discussed the limitations of evaluation and management by telemedicine and the availability of in person appointments. The patient expressed understanding and agreed to proceed.   HPI: Matthew Pratt called with some nonspecific symptoms recently.  He states he had some increase in fatigue, intermittent headache, nausea without vomiting.  He has actually had the symptoms for at least couple weeks.  He states he has felt somewhat chilled but has not taken his temperature.  This is not like a shaking rigor.  He has more difficulty getting warm.  He had recent TSH just a few months ago which was normal.  He is on replacement and not skipping any doses.  Is had occasional "muscle spasms ".  No actual muscle cramps.  Denies any fever.  No loss of taste or smell.  No significant myalgias.  No diarrhea.  No sick contacts.  Reviewed recent lab work with patient.  His chronic problems include history of obstructive sleep apnea, history of CAD, irritable bowel syndrome, hypothyroidism, hyperlipidemia, history of GERD, history of recurrent depression   ROS: See pertinent positives and negatives per HPI.  Past Medical History:  Diagnosis Date  . Anxiety   . Arthritis   . BPH (benign prostatic hypertrophy)   . CAD (coronary artery disease)    s/p STEMI 10/15/10 w/ Promus DES placed x1 in the first OM  . Colon polyps   . Depression   . Depression with anxiety   . Dupuytren's  contracture of hand   . ED (erectile dysfunction)   . GERD (gastroesophageal reflux disease)   . Glaucoma   . HLD (hyperlipidemia)   . Hyperthyroidism   . Hypocontractile bladder   . MI (myocardial infarction) ([place])   . OSA on CPAP   . Peyronie's disease   . Rosacea   . Skin cancer    BCC    Past Surgical History:  Procedure Laterality Date  . COLONOSCOPY WITH ESOPHAGOGASTRODUODENOSCOPY (EGD)     multiple  . CORONARY STENT PLACEMENT    . INNER EAR SURGERY    . LASIK  02/1998  . PENILE PROSTHESIS IMPLANT    . TONSILECTOMY, ADENOIDECTOMY, BILATERAL MYRINGOTOMY AND TUBES    . TONSILLECTOMY    . VASECTOMY    . WISDOM TOOTH EXTRACTION      Family History  Problem Relation Age of Onset  . COPD Other   . COPD Mother   . Varicose Veins Father   . Liver cancer Father   . Basal cell carcinoma Father   . Basal cell carcinoma Sister   . Rheum arthritis Maternal Grandmother   . Heart failure Maternal Grandfather   . Colon cancer Paternal Grandmother   . Arthritis Paternal Grandfather   . Colon cancer Paternal Grandfather   . Melanoma Sister     SOCIAL HX: Non-smoker   Current Outpatient Medications:  .  aspirin EC 81 MG tablet, Take by mouth., Disp: , Rfl:  .  b complex vitamins capsule, Take by mouth., Disp: , Rfl:  .  betamethasone valerate ointment (VALISONE) 0.1 %, , Disp: , Rfl:  .  Cholecalciferol (VITAMIN D3) 2000 units TABS, Take 4,000 Units by mouth daily. , Disp: , Rfl:  .  Multiple Vitamin (MULTIVITAMIN) tablet, Take by mouth., Disp: , Rfl:  .  omeprazole (PRILOSEC) 10 MG capsule, Take 1 capsule (10 mg total) by mouth daily., Disp: 90 capsule, Rfl: 3 .  SYNTHROID 137 MCG tablet, TAKE 1 TABLET EVERY DAY, Disp: 90 tablet, Rfl: 1 .  timolol (TIMOPTIC) 0.5 % ophthalmic solution, 1 drop 1 day or 1 dose.  , Disp: , Rfl:  .  venlafaxine XR (EFFEXOR XR) 37.5 MG 24 hr capsule, Take three capsules once daily, Disp: 270 capsule, Rfl: 3  EXAM:  VITALS per patient if  applicable:  GENERAL: alert, oriented, appears well and in no acute distress  HEENT: atraumatic, conjunttiva clear, no obvious abnormalities on inspection of external nose and ears  NECK: normal movements of the head and neck  LUNGS: on inspection no signs of respiratory distress, breathing rate appears normal, no obvious gross SOB, gasping or wheezing  CV: no obvious cyanosis  MS: moves all visible extremities without noticeable abnormality  PSYCH/NEURO: pleasant and cooperative, no obvious depression or anxiety, speech and thought processing grossly intact  ASSESSMENT AND PLAN:  Discussed the following assessment and plan:  #1 nonspecific symptoms of cold intolerance, fatigue, nausea without vomiting, intermittent mild headache.  He does have hypothyroidism but is on replacement and had recent normal TSH so doubt thyroid related.  We discussed the following  -Check to rule out any fever with monitor -We discussed possible Covid testing given the nonspecific nature of the symptoms above.  He will consider. -If Covid testing negative and symptoms persist consider office follow-up to further assess his fatigue symptoms  #2 GERD.  Patient requesting refills of omeprazole  -Refilled omeprazole  #3 history of CAD.  He had questions regarding aspirin use.  We discussed difference between primary and secondary prevention.  He had heard in the news about individuals over 70 not using aspirin but since he has known coronary artery disease we recommend that he continue baby aspirin daily unless advised to discontinue by his cardiologist    I discussed the assessment and treatment plan with the patient. The patient was provided an opportunity to ask questions and all were answered. The patient agreed with the plan and demonstrated an understanding of the instructions.   The patient was advised to call back or seek an in-person evaluation if the symptoms worsen or if the condition fails to  improve as anticipated.    [name], MD

## 2020-01-27 ENCOUNTER — Ambulatory Visit: Payer: Medicare Other | Attending: Internal Medicine

## 2020-01-27 DIAGNOSIS — Z23 Encounter for immunization: Secondary | ICD-10-CM

## 2020-01-27 NOTE — Progress Notes (Signed)
   Covid-19 Vaccination Clinic  Name:  Matthew Pratt    MRN: [id number] DOB: [birth year]  01/27/2020  [name] was observed post Covid-19 immunization for 30 minutes based on pre-vaccination screening without incidence. He was provided with Vaccine Information Sheet and instruction to access the V-Safe system.   [name] was instructed to call 911 with any severe reactions post vaccine: Marland Kitchen Difficulty breathing  . Swelling of your face and throat  . A fast heartbeat  . A bad rash all over your body  . Dizziness and weakness    Immunizations Administered    Name Date Dose VIS Date Route   Pfizer COVID-19 Vaccine 01/27/2020  9:42 AM 0.3 mL 11/16/2019 Intramuscular   Manufacturer: [place]   Lot: [id number]   [place]: [id number]

## 2020-02-20 ENCOUNTER — Ambulatory Visit: Payer: Medicare Other | Attending: Internal Medicine

## 2020-02-20 DIAGNOSIS — Z23 Encounter for immunization: Secondary | ICD-10-CM

## 2020-02-20 NOTE — Progress Notes (Signed)
   Covid-19 Vaccination Clinic  Name:  Jcion Prohaska Hittle    MRN: [id number] DOB: 08-Nov-1951  02/20/2020  Mr. Lumley was observed post Covid-19 immunization for 15 minutes without incident. He was provided with Vaccine Information Sheet and instruction to access the V-Safe system.   Mr. Bufkin was instructed to call 911 with any severe reactions post vaccine: Marland Kitchen Difficulty breathing  . Swelling of face and throat  . A fast heartbeat  . A bad rash all over body  . Dizziness and weakness   Immunizations Administered    Name Date Dose VIS Date Route   Pfizer COVID-19 Vaccine 02/20/2020  1:36 PM 0.3 mL 11/16/2019 Intramuscular   Manufacturer: [place]   Lot: [id number]   [place]: [id number]

## 2020-02-21 DIAGNOSIS — H524 Presbyopia: Secondary | ICD-10-CM | POA: Diagnosis not present

## 2020-02-21 DIAGNOSIS — H52223 Regular astigmatism, bilateral: Secondary | ICD-10-CM | POA: Diagnosis not present

## 2020-02-21 DIAGNOSIS — H2513 Age-related nuclear cataract, bilateral: Secondary | ICD-10-CM | POA: Diagnosis not present

## 2020-02-21 DIAGNOSIS — H5201 Hypermetropia, right eye: Secondary | ICD-10-CM | POA: Diagnosis not present

## 2020-03-17 ENCOUNTER — Telehealth: Payer: Self-pay | Admitting: Family Medicine

## 2020-03-17 ENCOUNTER — Encounter: Payer: Self-pay | Admitting: Family Medicine

## 2020-03-17 DIAGNOSIS — H9190 Unspecified hearing loss, unspecified ear: Secondary | ICD-10-CM

## 2020-03-17 NOTE — Telephone Encounter (Signed)
Pt is requesting a referral to Dr. Alva Garnet, an audiologist in [facility]. Per pt, he has been having serious hearing loss. Thanks  Fax:[phone] Ph: [phone]

## 2020-03-17 NOTE — Telephone Encounter (Signed)
Please advise 

## 2020-03-18 NOTE — Telephone Encounter (Signed)
Referral has been placed pt has been notified through [employer]

## 2020-03-18 NOTE — Telephone Encounter (Signed)
OK to refer.

## 2020-03-24 ENCOUNTER — Encounter: Payer: Self-pay | Admitting: Family Medicine

## 2020-03-24 DIAGNOSIS — E039 Hypothyroidism, unspecified: Secondary | ICD-10-CM

## 2020-04-15 DIAGNOSIS — H90A21 Sensorineural hearing loss, unilateral, right ear, with restricted hearing on the contralateral side: Secondary | ICD-10-CM | POA: Diagnosis not present

## 2020-04-15 DIAGNOSIS — H90A32 Mixed conductive and sensorineural hearing loss, unilateral, left ear with restricted hearing on the contralateral side: Secondary | ICD-10-CM | POA: Diagnosis not present

## 2020-04-16 ENCOUNTER — Other Ambulatory Visit: Payer: Self-pay

## 2020-04-16 ENCOUNTER — Ambulatory Visit (INDEPENDENT_AMBULATORY_CARE_PROVIDER_SITE_OTHER)
Admission: RE | Admit: 2020-04-16 | Discharge: 2020-04-16 | Disposition: A | Discharge status: Home / Self Care | Payer: Medicare Other | Source: Ambulatory Visit

## 2020-04-16 DIAGNOSIS — E039 Hypothyroidism, unspecified: Secondary | ICD-10-CM

## 2020-04-17 ENCOUNTER — Other Ambulatory Visit: Payer: Self-pay

## 2020-04-18 ENCOUNTER — Encounter: Payer: Self-pay | Admitting: Family Medicine

## 2020-04-18 ENCOUNTER — Ambulatory Visit (INDEPENDENT_AMBULATORY_CARE_PROVIDER_SITE_OTHER): Payer: Medicare Other | Admitting: Family Medicine

## 2020-04-18 VITALS — BP 118/60 | HR 60 | Temp 97.8°F | Wt 221.2 lb

## 2020-04-18 DIAGNOSIS — R5383 Other fatigue: Secondary | ICD-10-CM | POA: Diagnosis not present

## 2020-04-18 DIAGNOSIS — M8580 Other specified disorders of bone density and structure, unspecified site: Secondary | ICD-10-CM

## 2020-04-18 DIAGNOSIS — R635 Abnormal weight gain: Secondary | ICD-10-CM | POA: Diagnosis not present

## 2020-04-18 DIAGNOSIS — E785 Hyperlipidemia, unspecified: Secondary | ICD-10-CM

## 2020-04-18 NOTE — Patient Instructions (Signed)
Osteopenia  Osteopenia is a loss of thickness (density) inside of the bones. Another name for osteopenia is low bone mass. Mild osteopenia is a normal part of aging. It is not a disease, and it does not cause symptoms. However, if you have osteopenia and continue to lose bone mass, you could develop a condition that causes the bones to become thin and break more easily (osteoporosis). You may also lose some height, have back pain, and have a stooped posture. Although osteopenia is not a disease, making changes to your lifestyle and diet can help to prevent osteopenia from developing into osteoporosis. What are the causes? Osteopenia is caused by loss of calcium in the bones.  Bones are constantly changing. Old bone cells are continually being replaced with new bone cells. This process builds new bone. The mineral calcium is needed to build new bone and maintain bone density. Bone density is usually highest around age 35. After that, most people's bodies cannot replace all the bone they have lost with new bone. What increases the risk? You are more likely to develop this condition if:  You are older than age 50.  You are a woman who went through menopause early.  You have a long illness that keeps you in bed.  You do not get enough exercise.  You lack certain nutrients (malnutrition).  You have an overactive thyroid gland (hyperthyroidism).  You smoke.  You drink a lot of alcohol.  You are taking medicines that weaken the bones, such as steroids. What are the signs or symptoms? This condition does not cause any symptoms. You may have a slightly higher risk for bone breaks (fractures), so getting fractures more easily than normal may be an indication of osteopenia. How is this diagnosed? Your health care provider can diagnose this condition with a special type of X-ray exam that measures bone density (dual-energy X-ray absorptiometry, DEXA). This test can measure bone density in your  hips, spine, and wrists. Osteopenia has no symptoms, so this condition is usually diagnosed after a routine bone density screening test is done for osteoporosis. This routine screening is usually done for:  Women who are age 65 or older.  Men who are age 70 or older. If you have risk factors for osteopenia, you may have the screening test at an earlier age. How is this treated? Making dietary and lifestyle changes can lower your risk for osteoporosis. If you have severe osteopenia that is close to becoming osteoporosis, your health care provider may prescribe medicines and dietary supplements such as calcium and vitamin D. These supplements help to rebuild bone density. Follow these instructions at home:   Take over-the-counter and prescription medicines only as told by your health care provider. These include vitamins and supplements.  Eat a diet that is high in calcium and vitamin D. ? Calcium is found in dairy products, beans, salmon, and leafy green vegetables like spinach and broccoli. ? Look for foods that have vitamin D and calcium added to them (fortified foods), such as orange juice, cereal, and bread.  Do 30 or more minutes of a weight-bearing exercise every day, such as walking, jogging, or playing a sport. These types of exercises strengthen the bones.  Take precautions at home to lower your risk of falling, such as: ? Keeping rooms well-lit and free of clutter, such as cords. ? Installing safety rails on stairs. ? Using rubber mats in the bathroom or other areas that are often wet or slippery.  Do not use   any products that contain nicotine or tobacco, such as cigarettes and e-cigarettes. If you need help quitting, ask your health care provider.  Avoid alcohol or limit alcohol intake to no more than 1 drink a day for nonpregnant women and 2 drinks a day for men. One drink equals 12 oz of beer, 5 oz of wine, or 1 oz of hard liquor.  Keep all follow-up visits as told by your  health care provider. This is important. Contact a health care provider if:  You have not had a bone density screening for osteoporosis and you are: ? A woman, age 65 or older. ? A man, age 70 or older.  You are a postmenopausal woman who has not had a bone density screening for osteoporosis.  You are older than age 50 and you want to know if you should have bone density screening for osteoporosis. Summary  Osteopenia is a loss of thickness (density) inside of the bones. Another name for osteopenia is low bone mass.  Osteopenia is not a disease, but it may increase your risk for a condition that causes the bones to become thin and break more easily (osteoporosis).  You may be at risk for osteopenia if you are older than age 50 or if you are a woman who went through early menopause.  Osteopenia does not cause any symptoms, but it can be diagnosed with a bone density screening test.  Dietary and lifestyle changes are the first treatment for osteopenia. These may lower your risk for osteoporosis. This information is not intended to replace advice given to you by your health care provider. Make sure you discuss any questions you have with your health care provider. Document Revised: 11/04/2017 Document Reviewed: 08/31/2017 Elsevier Patient Education  2020 Elsevier Inc.  

## 2020-04-18 NOTE — Progress Notes (Signed)
Subjective:     Patient ID: Matthew Pratt, male   DOB: 07-Aug-1951, 69 y.o.   MRN: [id number]  HPI Matthew Pratt is seen to follow-up regarding recent DEXA scan.  He had osteopenia with -1.3 T score at the hip.  No prior scanning.  He wished to discuss results today.  No chronic prednisone use.  He has some chronic fatigue and wonders if his testosterone levels could be low.  He has had some mild weight gain during the pandemic.  He is trying to make some dietary changes.  Would like to get some nutritional guidance.  No known family history of osteoporosis.  He does take some vitamin D.  Not taking any calcium supplementation. He has hypothyroidism and is on replacement.  His last TSH was in September and normal.  Non-smoker.  No regular alcohol use.  Drinks about 1 cup of coffee per day.  No heavy caffeine use.  Has had some mild weight gain during pandemic.  Hx of mild hyperlipidemia.  No diabetes.   He would like to seek nutritional consult for guidance on weight loss strategies- if he can get covered by insurance.  Hx of CAD.  Goals LDL < 70.  Prior intolerance with statins.    Wt Readings from Last 3 Encounters:  04/18/20 221 lb 3.2 oz (100.3 kg)  11/13/19 210 lb 9.6 oz (95.5 kg)  08/28/19 210 lb 4.8 oz (95.4 kg)    The ASCVD Risk score [name], et al., 2013) failed to calculate for the following reasons:   The patient has a prior MI or stroke diagnosis   Past Medical History:  Diagnosis Date  . Anxiety   . Arthritis   . BPH (benign prostatic hypertrophy)   . CAD (coronary artery disease)    s/p STEMI 10/15/10 w/ Promus DES placed x1 in the first OM  . Colon polyps   . Depression   . Depression with anxiety   . Dupuytren's contracture of hand   . ED (erectile dysfunction)   . GERD (gastroesophageal reflux disease)   . Glaucoma   . HLD (hyperlipidemia)   . Hyperthyroidism   . Hypocontractile bladder   . MI (myocardial infarction) ([place])   . OSA on CPAP   . Peyronie's  disease   . Rosacea   . Skin cancer    BCC   Past Surgical History:  Procedure Laterality Date  . COLONOSCOPY WITH ESOPHAGOGASTRODUODENOSCOPY (EGD)     multiple  . CORONARY STENT PLACEMENT    . INNER EAR SURGERY    . LASIK  02/1998  . PENILE PROSTHESIS IMPLANT    . TONSILECTOMY, ADENOIDECTOMY, BILATERAL MYRINGOTOMY AND TUBES    . TONSILLECTOMY    . VASECTOMY    . WISDOM TOOTH EXTRACTION      reports that he has never smoked. He has never used smokeless tobacco. He reports that he does not drink alcohol or use drugs. family history includes Arthritis in his paternal grandfather; Basal cell carcinoma in his father and sister; COPD in his mother and another family member; Colon cancer in his paternal grandfather and paternal grandmother; Heart failure in his maternal grandfather; Liver cancer in his father; Melanoma in his sister; Rheum arthritis in his maternal grandmother; Varicose Veins in his father. Allergies  Allergen Reactions  . Crestor [Rosuvastatin]     Myalgia. Myalgia.   . Iodinated Diagnostic Agents Hives    Makes PT itchy  . Sulfa Antibiotics     REACTION: rash  .  Sulfonamide Derivatives     REACTION: rash     Review of Systems  Constitutional: Negative for chills and fever.  Respiratory: Negative for cough and shortness of breath.        Objective:   Physical Exam Vitals reviewed.  Constitutional:      Appearance: Normal appearance.  Cardiovascular:     Rate and Rhythm: Normal rate and regular rhythm.  Pulmonary:     Effort: Pulmonary effort is normal.     Breath sounds: Normal breath sounds.  Neurological:     Mental Status: He is alert.        Assessment:     #1 osteopenia by recent DEXA scan.  #2 fatigue.  He has questions regarding testosterone level.  This has not been screened  #3 history of mild hyperlipidemia    Plan:     -Discussed osteopenia in some detail.  We focused on conservative treatment at this point with regular  weightbearing exercise, continued vitamin D supplementation.  We prefer that he try to get his calcium through dietary means.  Consider repeat DEXA in 2 to 3 years  -Patient requesting referral to nutritionist for further guidance with diet and suggestions for weight loss.  We will set this up  -He will return next week for early morning testosterone level  [name] MD [facility]

## 2020-04-19 DIAGNOSIS — M8580 Other specified disorders of bone density and structure, unspecified site: Secondary | ICD-10-CM | POA: Insufficient documentation

## 2020-04-21 ENCOUNTER — Other Ambulatory Visit: Payer: Self-pay

## 2020-04-21 DIAGNOSIS — H90A32 Mixed conductive and sensorineural hearing loss, unilateral, left ear with restricted hearing on the contralateral side: Secondary | ICD-10-CM | POA: Insufficient documentation

## 2020-04-21 DIAGNOSIS — K219 Gastro-esophageal reflux disease without esophagitis: Secondary | ICD-10-CM | POA: Diagnosis not present

## 2020-04-21 DIAGNOSIS — J040 Acute laryngitis: Secondary | ICD-10-CM | POA: Diagnosis not present

## 2020-04-22 ENCOUNTER — Other Ambulatory Visit (INDEPENDENT_AMBULATORY_CARE_PROVIDER_SITE_OTHER): Payer: Medicare Other

## 2020-04-22 DIAGNOSIS — R5383 Other fatigue: Secondary | ICD-10-CM | POA: Diagnosis not present

## 2020-04-22 LAB — TESTOSTERONE: Testosterone: 430.7 ng/dL (ref 300.00–890.00)

## 2020-05-15 DIAGNOSIS — H401131 Primary open-angle glaucoma, bilateral, mild stage: Secondary | ICD-10-CM | POA: Diagnosis not present

## 2020-05-30 ENCOUNTER — Telehealth: Payer: Medicare Other | Admitting: Emergency Medicine

## 2020-05-30 DIAGNOSIS — S3992XA Unspecified injury of lower back, initial encounter: Secondary | ICD-10-CM | POA: Diagnosis not present

## 2020-05-30 MED ORDER — METHYLPREDNISOLONE 4 MG PO TBPK: ORAL_TABLET | ORAL | 0 refills | Status: DC

## 2020-05-30 MED ORDER — CYCLOBENZAPRINE HCL 10 MG PO TABS
10.0000 mg | ORAL_TABLET | Freq: Three times a day (TID) | ORAL | 0 refills | Status: DC | PRN
Start: 2020-05-30 — End: 2020-07-28

## 2020-05-30 NOTE — Progress Notes (Signed)
We are sorry that you are not feeling well.  Here is how we plan to help!  Based on what you have shared with me it looks like you mostly have acute back pain.  Acute back pain is defined as musculoskeletal pain that can resolve in 1-3 weeks with conservative treatment.  I have prescribed medrol dosepak a steroid, as well as Flexeril 10 mg every eight hours as needed which is a muscle relaxer.   **Unfortunately I am unable to prescribe tramadol or other narcotic medications on this particular platform.  If you need increased pain control you will need to have a face-to-face visit at an urgent care or with your primary care doctor.  I am also unable to prescribe anti-inflammatory medications due to inner action with your Effexor. Please continue taking Tylenol**  Some patients experience stomach irritation or in increased heartburn with anti-inflammatory drugs.  Please keep in mind that muscle relaxer's can cause fatigue and should not be taken while at work or driving.  Back pain is very common.  The pain often gets better over time.  The cause of back pain is usually not dangerous.  Most people can learn to manage their back pain on their own.  Home Care  Stay active.  Start with short walks on flat ground if you can.  Try to walk farther each day.  Do not sit, drive or stand in one place for more than 30 minutes.  Do not stay in bed.  Do not avoid exercise or work.  Activity can help your back heal faster.  Be careful when you bend or lift an object.  Bend at your knees, keep the object close to you, and do not twist.  Sleep on a firm mattress.  Lie on your side, and bend your knees.  If you lie on your back, put a pillow under your knees.  Only take medicines as told by your doctor.  Put ice on the injured area.  Put ice in a plastic bag  Place a towel between your skin and the bag  Leave the ice on for 15-20 minutes, 3-4 times a day for the first 2-3 days. 210 After that, you can  switch between ice and heat packs.  Ask your doctor about back exercises or massage.  Avoid feeling anxious or stressed.  Find good ways to deal with stress, such as exercise.  Get Help Right Way If:  Your pain does not go away with rest or medicine.  Your pain does not go away in 1 week.  You have new problems.  You do not feel well.  The pain spreads into your legs.  You cannot control when you poop (bowel movement) or pee (urinate)  You feel sick to your stomach (nauseous) or throw up (vomit)  You have belly (abdominal) pain.  You feel like you may pass out (faint).  If you develop a fever.  Make Sure you:  Understand these instructions.  Will watch your condition  Will get help right away if you are not doing well or get worse.  Your e-visit answers were reviewed by a board certified advanced clinical practitioner to complete your personal care plan.  Depending on the condition, your plan could have included both over the counter or prescription medications.  If there is a problem please reply  once you have received a response from your provider.  Your safety is important to [place].  If you have drug allergies check your prescription carefully.  You can use MyChart to ask questions about today's visit, request a non-urgent call back, or ask for a work or school excuse for 24 hours related to this e-Visit. If it has been greater than 24 hours you will need to follow up with your provider, or enter a new e-Visit to address those concerns.  You will get an e-mail in the next two days asking about your experience.  I hope that your e-visit has been valuable and will speed your recovery. Thank you for using e-visits.  Greater than 5 but less than 10 minutes spent researching, coordinating, and implementing care for this patient today

## 2020-06-03 DIAGNOSIS — M9903 Segmental and somatic dysfunction of lumbar region: Secondary | ICD-10-CM | POA: Diagnosis not present

## 2020-06-03 DIAGNOSIS — M9901 Segmental and somatic dysfunction of cervical region: Secondary | ICD-10-CM | POA: Diagnosis not present

## 2020-06-03 DIAGNOSIS — M5441 Lumbago with sciatica, right side: Secondary | ICD-10-CM | POA: Diagnosis not present

## 2020-06-03 DIAGNOSIS — M5032 Other cervical disc degeneration, mid-cervical region, unspecified level: Secondary | ICD-10-CM | POA: Diagnosis not present

## 2020-06-05 DIAGNOSIS — M5441 Lumbago with sciatica, right side: Secondary | ICD-10-CM | POA: Diagnosis not present

## 2020-06-05 DIAGNOSIS — M9903 Segmental and somatic dysfunction of lumbar region: Secondary | ICD-10-CM | POA: Diagnosis not present

## 2020-06-05 DIAGNOSIS — M5032 Other cervical disc degeneration, mid-cervical region, unspecified level: Secondary | ICD-10-CM | POA: Diagnosis not present

## 2020-06-05 DIAGNOSIS — M9901 Segmental and somatic dysfunction of cervical region: Secondary | ICD-10-CM | POA: Diagnosis not present

## 2020-06-06 DIAGNOSIS — M9903 Segmental and somatic dysfunction of lumbar region: Secondary | ICD-10-CM | POA: Diagnosis not present

## 2020-06-06 DIAGNOSIS — M5032 Other cervical disc degeneration, mid-cervical region, unspecified level: Secondary | ICD-10-CM | POA: Diagnosis not present

## 2020-06-06 DIAGNOSIS — M9901 Segmental and somatic dysfunction of cervical region: Secondary | ICD-10-CM | POA: Diagnosis not present

## 2020-06-06 DIAGNOSIS — M5441 Lumbago with sciatica, right side: Secondary | ICD-10-CM | POA: Diagnosis not present

## 2020-06-09 DIAGNOSIS — M9901 Segmental and somatic dysfunction of cervical region: Secondary | ICD-10-CM | POA: Diagnosis not present

## 2020-06-09 DIAGNOSIS — M9903 Segmental and somatic dysfunction of lumbar region: Secondary | ICD-10-CM | POA: Diagnosis not present

## 2020-06-09 DIAGNOSIS — M5441 Lumbago with sciatica, right side: Secondary | ICD-10-CM | POA: Diagnosis not present

## 2020-06-09 DIAGNOSIS — M5032 Other cervical disc degeneration, mid-cervical region, unspecified level: Secondary | ICD-10-CM | POA: Diagnosis not present

## 2020-06-10 DIAGNOSIS — H905 Unspecified sensorineural hearing loss: Secondary | ICD-10-CM | POA: Diagnosis not present

## 2020-06-11 DIAGNOSIS — M5441 Lumbago with sciatica, right side: Secondary | ICD-10-CM | POA: Diagnosis not present

## 2020-06-11 DIAGNOSIS — M5032 Other cervical disc degeneration, mid-cervical region, unspecified level: Secondary | ICD-10-CM | POA: Diagnosis not present

## 2020-06-11 DIAGNOSIS — M9901 Segmental and somatic dysfunction of cervical region: Secondary | ICD-10-CM | POA: Diagnosis not present

## 2020-06-11 DIAGNOSIS — M9903 Segmental and somatic dysfunction of lumbar region: Secondary | ICD-10-CM | POA: Diagnosis not present

## 2020-06-12 DIAGNOSIS — H401131 Primary open-angle glaucoma, bilateral, mild stage: Secondary | ICD-10-CM | POA: Diagnosis not present

## 2020-06-13 DIAGNOSIS — M5441 Lumbago with sciatica, right side: Secondary | ICD-10-CM | POA: Diagnosis not present

## 2020-06-13 DIAGNOSIS — M5032 Other cervical disc degeneration, mid-cervical region, unspecified level: Secondary | ICD-10-CM | POA: Diagnosis not present

## 2020-06-13 DIAGNOSIS — M9903 Segmental and somatic dysfunction of lumbar region: Secondary | ICD-10-CM | POA: Diagnosis not present

## 2020-06-13 DIAGNOSIS — M9901 Segmental and somatic dysfunction of cervical region: Secondary | ICD-10-CM | POA: Diagnosis not present

## 2020-06-16 DIAGNOSIS — M9901 Segmental and somatic dysfunction of cervical region: Secondary | ICD-10-CM | POA: Diagnosis not present

## 2020-06-16 DIAGNOSIS — M5441 Lumbago with sciatica, right side: Secondary | ICD-10-CM | POA: Diagnosis not present

## 2020-06-16 DIAGNOSIS — M9903 Segmental and somatic dysfunction of lumbar region: Secondary | ICD-10-CM | POA: Diagnosis not present

## 2020-06-16 DIAGNOSIS — M5032 Other cervical disc degeneration, mid-cervical region, unspecified level: Secondary | ICD-10-CM | POA: Diagnosis not present

## 2020-06-17 DIAGNOSIS — M5032 Other cervical disc degeneration, mid-cervical region, unspecified level: Secondary | ICD-10-CM | POA: Diagnosis not present

## 2020-06-17 DIAGNOSIS — M9903 Segmental and somatic dysfunction of lumbar region: Secondary | ICD-10-CM | POA: Diagnosis not present

## 2020-06-17 DIAGNOSIS — M9901 Segmental and somatic dysfunction of cervical region: Secondary | ICD-10-CM | POA: Diagnosis not present

## 2020-06-17 DIAGNOSIS — M5441 Lumbago with sciatica, right side: Secondary | ICD-10-CM | POA: Diagnosis not present

## 2020-06-19 DIAGNOSIS — M9903 Segmental and somatic dysfunction of lumbar region: Secondary | ICD-10-CM | POA: Diagnosis not present

## 2020-06-19 DIAGNOSIS — M5032 Other cervical disc degeneration, mid-cervical region, unspecified level: Secondary | ICD-10-CM | POA: Diagnosis not present

## 2020-06-19 DIAGNOSIS — M9901 Segmental and somatic dysfunction of cervical region: Secondary | ICD-10-CM | POA: Diagnosis not present

## 2020-06-19 DIAGNOSIS — M5441 Lumbago with sciatica, right side: Secondary | ICD-10-CM | POA: Diagnosis not present

## 2020-06-23 ENCOUNTER — Other Ambulatory Visit: Payer: Self-pay | Admitting: Family Medicine

## 2020-06-23 DIAGNOSIS — M9901 Segmental and somatic dysfunction of cervical region: Secondary | ICD-10-CM | POA: Diagnosis not present

## 2020-06-23 DIAGNOSIS — M5441 Lumbago with sciatica, right side: Secondary | ICD-10-CM | POA: Diagnosis not present

## 2020-06-23 DIAGNOSIS — M9903 Segmental and somatic dysfunction of lumbar region: Secondary | ICD-10-CM | POA: Diagnosis not present

## 2020-06-23 DIAGNOSIS — M5032 Other cervical disc degeneration, mid-cervical region, unspecified level: Secondary | ICD-10-CM | POA: Diagnosis not present

## 2020-06-24 DIAGNOSIS — M5441 Lumbago with sciatica, right side: Secondary | ICD-10-CM | POA: Diagnosis not present

## 2020-06-24 DIAGNOSIS — M9903 Segmental and somatic dysfunction of lumbar region: Secondary | ICD-10-CM | POA: Diagnosis not present

## 2020-06-24 DIAGNOSIS — M5032 Other cervical disc degeneration, mid-cervical region, unspecified level: Secondary | ICD-10-CM | POA: Diagnosis not present

## 2020-06-24 DIAGNOSIS — M9901 Segmental and somatic dysfunction of cervical region: Secondary | ICD-10-CM | POA: Diagnosis not present

## 2020-06-26 DIAGNOSIS — M9901 Segmental and somatic dysfunction of cervical region: Secondary | ICD-10-CM | POA: Diagnosis not present

## 2020-06-26 DIAGNOSIS — M5032 Other cervical disc degeneration, mid-cervical region, unspecified level: Secondary | ICD-10-CM | POA: Diagnosis not present

## 2020-06-26 DIAGNOSIS — M5441 Lumbago with sciatica, right side: Secondary | ICD-10-CM | POA: Diagnosis not present

## 2020-06-26 DIAGNOSIS — M9903 Segmental and somatic dysfunction of lumbar region: Secondary | ICD-10-CM | POA: Diagnosis not present

## 2020-06-27 ENCOUNTER — Emergency Department (HOSPITAL_COMMUNITY): Payer: Medicare Other

## 2020-06-27 ENCOUNTER — Inpatient Hospital Stay (HOSPITAL_COMMUNITY)
Admission: EM | Admit: 2020-06-27 | Discharge: 2020-07-01 | DRG: 445 | Disposition: A | Discharge status: Home / Self Care | Payer: Medicare Other | Attending: Family Medicine | Admitting: Family Medicine

## 2020-06-27 ENCOUNTER — Encounter (HOSPITAL_COMMUNITY): Payer: Self-pay | Admitting: Pharmacy Technician

## 2020-06-27 ENCOUNTER — Other Ambulatory Visit: Payer: Self-pay

## 2020-06-27 DIAGNOSIS — S32039A Unspecified fracture of third lumbar vertebra, initial encounter for closed fracture: Secondary | ICD-10-CM | POA: Diagnosis present

## 2020-06-27 DIAGNOSIS — G8929 Other chronic pain: Secondary | ICD-10-CM | POA: Diagnosis not present

## 2020-06-27 DIAGNOSIS — I252 Old myocardial infarction: Secondary | ICD-10-CM | POA: Diagnosis not present

## 2020-06-27 DIAGNOSIS — I700 Atherosclerosis of aorta: Secondary | ICD-10-CM | POA: Diagnosis not present

## 2020-06-27 DIAGNOSIS — K228 Other specified diseases of esophagus: Secondary | ICD-10-CM | POA: Diagnosis not present

## 2020-06-27 DIAGNOSIS — R945 Abnormal results of liver function studies: Secondary | ICD-10-CM | POA: Diagnosis not present

## 2020-06-27 DIAGNOSIS — K580 Irritable bowel syndrome with diarrhea: Secondary | ICD-10-CM | POA: Diagnosis present

## 2020-06-27 DIAGNOSIS — K760 Fatty (change of) liver, not elsewhere classified: Secondary | ICD-10-CM | POA: Diagnosis present

## 2020-06-27 DIAGNOSIS — Z8261 Family history of arthritis: Secondary | ICD-10-CM

## 2020-06-27 DIAGNOSIS — K219 Gastro-esophageal reflux disease without esophagitis: Secondary | ICD-10-CM | POA: Diagnosis present

## 2020-06-27 DIAGNOSIS — R1084 Generalized abdominal pain: Secondary | ICD-10-CM | POA: Diagnosis not present

## 2020-06-27 DIAGNOSIS — Z85828 Personal history of other malignant neoplasm of skin: Secondary | ICD-10-CM

## 2020-06-27 DIAGNOSIS — R7989 Other specified abnormal findings of blood chemistry: Secondary | ICD-10-CM | POA: Diagnosis not present

## 2020-06-27 DIAGNOSIS — M1990 Unspecified osteoarthritis, unspecified site: Secondary | ICD-10-CM | POA: Diagnosis present

## 2020-06-27 DIAGNOSIS — F418 Other specified anxiety disorders: Secondary | ICD-10-CM | POA: Diagnosis present

## 2020-06-27 DIAGNOSIS — Z8249 Family history of ischemic heart disease and other diseases of the circulatory system: Secondary | ICD-10-CM

## 2020-06-27 DIAGNOSIS — R109 Unspecified abdominal pain: Secondary | ICD-10-CM | POA: Diagnosis not present

## 2020-06-27 DIAGNOSIS — Z79890 Hormone replacement therapy (postmenopausal): Secondary | ICD-10-CM

## 2020-06-27 DIAGNOSIS — N400 Enlarged prostate without lower urinary tract symptoms: Secondary | ICD-10-CM | POA: Diagnosis present

## 2020-06-27 DIAGNOSIS — E1165 Type 2 diabetes mellitus with hyperglycemia: Secondary | ICD-10-CM | POA: Diagnosis not present

## 2020-06-27 DIAGNOSIS — M4854XA Collapsed vertebra, not elsewhere classified, thoracic region, initial encounter for fracture: Secondary | ICD-10-CM | POA: Diagnosis present

## 2020-06-27 DIAGNOSIS — R1013 Epigastric pain: Secondary | ICD-10-CM

## 2020-06-27 DIAGNOSIS — E039 Hypothyroidism, unspecified: Secondary | ICD-10-CM | POA: Diagnosis present

## 2020-06-27 DIAGNOSIS — Z825 Family history of asthma and other chronic lower respiratory diseases: Secondary | ICD-10-CM

## 2020-06-27 DIAGNOSIS — Z800 Family history of malignant neoplasm of digestive organs: Secondary | ICD-10-CM | POA: Diagnosis not present

## 2020-06-27 DIAGNOSIS — G4733 Obstructive sleep apnea (adult) (pediatric): Secondary | ICD-10-CM | POA: Diagnosis not present

## 2020-06-27 DIAGNOSIS — Z20822 Contact with and (suspected) exposure to covid-19: Secondary | ICD-10-CM | POA: Diagnosis not present

## 2020-06-27 DIAGNOSIS — R932 Abnormal findings on diagnostic imaging of liver and biliary tract: Secondary | ICD-10-CM | POA: Diagnosis not present

## 2020-06-27 DIAGNOSIS — K8050 Calculus of bile duct without cholangitis or cholecystitis without obstruction: Secondary | ICD-10-CM | POA: Diagnosis not present

## 2020-06-27 DIAGNOSIS — K828 Other specified diseases of gallbladder: Secondary | ICD-10-CM | POA: Diagnosis not present

## 2020-06-27 DIAGNOSIS — Z79899 Other long term (current) drug therapy: Secondary | ICD-10-CM

## 2020-06-27 DIAGNOSIS — I2510 Atherosclerotic heart disease of native coronary artery without angina pectoris: Secondary | ICD-10-CM | POA: Diagnosis present

## 2020-06-27 DIAGNOSIS — M545 Low back pain: Secondary | ICD-10-CM | POA: Diagnosis not present

## 2020-06-27 DIAGNOSIS — K819 Cholecystitis, unspecified: Secondary | ICD-10-CM | POA: Diagnosis not present

## 2020-06-27 DIAGNOSIS — E785 Hyperlipidemia, unspecified: Secondary | ICD-10-CM | POA: Diagnosis present

## 2020-06-27 DIAGNOSIS — K8080 Other cholelithiasis without obstruction: Principal | ICD-10-CM | POA: Diagnosis present

## 2020-06-27 DIAGNOSIS — Z7982 Long term (current) use of aspirin: Secondary | ICD-10-CM

## 2020-06-27 DIAGNOSIS — Z888 Allergy status to other drugs, medicaments and biological substances status: Secondary | ICD-10-CM

## 2020-06-27 DIAGNOSIS — Z955 Presence of coronary angioplasty implant and graft: Secondary | ICD-10-CM

## 2020-06-27 DIAGNOSIS — Z91041 Radiographic dye allergy status: Secondary | ICD-10-CM

## 2020-06-27 DIAGNOSIS — Z808 Family history of malignant neoplasm of other organs or systems: Secondary | ICD-10-CM

## 2020-06-27 DIAGNOSIS — R9431 Abnormal electrocardiogram [ECG] [EKG]: Secondary | ICD-10-CM | POA: Diagnosis not present

## 2020-06-27 DIAGNOSIS — Z882 Allergy status to sulfonamides status: Secondary | ICD-10-CM

## 2020-06-27 LAB — COMPREHENSIVE METABOLIC PANEL
ALT: 144 U/L — ABNORMAL HIGH (ref 0–44)
AST: 206 U/L — ABNORMAL HIGH (ref 15–41)
Albumin: 3.7 g/dL (ref 3.5–5.0)
Alkaline Phosphatase: 91 U/L (ref 38–126)
Anion gap: 9 (ref 5–15)
BUN: 12 mg/dL (ref 8–23)
CO2: 27 mmol/L (ref 22–32)
Calcium: 9 mg/dL (ref 8.9–10.3)
Chloride: 101 mmol/L (ref 98–111)
Creatinine, Ser: 1.09 mg/dL (ref 0.61–1.24)
GFR calc Af Amer: >60 mL/min (ref >60)
GFR calc non Af Amer: >60 mL/min (ref >60)
Glucose, Bld: 179 mg/dL — ABNORMAL HIGH (ref 70–99)
Potassium: 4.3 mmol/L (ref 3.5–5.1)
Sodium: 137 mmol/L (ref 135–145)
Total Bilirubin: 2.1 mg/dL — ABNORMAL HIGH (ref 0.3–1.2)
Total Protein: 6.6 g/dL (ref 6.5–8.1)

## 2020-06-27 LAB — CBC
HCT: 43.5 % (ref 39.0–52.0)
Hemoglobin: 14.3 g/dL (ref 13.0–17.0)
MCH: 34 pg (ref 26.0–34.0)
MCHC: 32.9 g/dL (ref 30.0–36.0)
MCV: 103.6 fL — ABNORMAL HIGH (ref 80.0–100.0)
Platelets: 194 10*3/uL (ref 150–400)
RBC: 4.2 MIL/uL — ABNORMAL LOW (ref 4.22–5.81)
RDW: 12 % (ref 11.5–15.5)
WBC: 7.7 10*3/uL (ref 4.0–10.5)
nRBC: 0 % (ref 0.0–0.2)

## 2020-06-27 LAB — HEPATITIS PANEL, ACUTE
HCV Ab: NONREACTIVE
Hep A IgM: NONREACTIVE
Hep B C IgM: NONREACTIVE
Hepatitis B Surface Ag: NONREACTIVE

## 2020-06-27 LAB — URINALYSIS, ROUTINE W REFLEX MICROSCOPIC
Bilirubin Urine: NEGATIVE
Glucose, UA: NEGATIVE mg/dL
Hgb urine dipstick: NEGATIVE
Ketones, ur: NEGATIVE mg/dL
Leukocytes,Ua: NEGATIVE
Nitrite: NEGATIVE
Protein, ur: NEGATIVE mg/dL
Specific Gravity, Urine: 1.02 (ref 1.005–1.030)
pH: 8 (ref 5.0–8.0)

## 2020-06-27 LAB — ACETAMINOPHEN LEVEL: Acetaminophen (Tylenol), Serum: <10 ug/mL — ABNORMAL LOW (ref 10–30)

## 2020-06-27 LAB — TROPONIN I (HIGH SENSITIVITY): Troponin I (High Sensitivity): 11 ng/L (ref <18)

## 2020-06-27 LAB — LIPASE, BLOOD: Lipase: 39 U/L (ref 11–51)

## 2020-06-27 MED ORDER — ONDANSETRON HCL 4 MG/2ML IJ SOLN
4.0000 mg | Freq: Once | INTRAMUSCULAR | Status: AC
  Administered 2020-06-27: 4 mg via INTRAVENOUS
  Filled 2020-06-27: qty 2

## 2020-06-27 MED ORDER — PANTOPRAZOLE SODIUM 40 MG IV SOLR
40.0000 mg | Freq: Once | INTRAVENOUS | Status: AC
  Administered 2020-06-27: 40 mg via INTRAVENOUS
  Filled 2020-06-27: qty 40

## 2020-06-27 MED ORDER — BARIUM SULFATE 2.1 % PO SUSP
ORAL | Status: AC
  Filled 2020-06-27: qty 1

## 2020-06-27 MED ORDER — SODIUM CHLORIDE 0.9% FLUSH
3.0000 mL | Freq: Once | INTRAVENOUS | Status: AC
  Administered 2020-06-27: 3 mL via INTRAVENOUS

## 2020-06-27 NOTE — ED Triage Notes (Signed)
Pt presents with epigastric pain onset suddenly this morning along with diaphoresis. Pt with cardiac hx but states this feels different.

## 2020-06-27 NOTE — ED Notes (Signed)
Pt transported to CT ?

## 2020-06-27 NOTE — ED Notes (Signed)
Pt transported to ultrasound.

## 2020-06-27 NOTE — ED Provider Notes (Signed)
[facility] EMERGENCY DEPARTMENT Provider Note   CSN: [id number] Arrival date & time: 06/27/20  1239     History Chief Complaint  Patient presents with  . Abdominal Pain    Matthew Pratt is a 69 y.o. male.  Patient is a 69 year old male who presents with epigastric pain.  He does have a history of GERD and coronary artery disease status post STEMI in 2011.  He reports that earlier today he had a sudden onset of intense pain in his epigastrium.  It was nonradiating.  No associated back pain.  He had some severe nausea but no vomiting.  No change in his stools.  No visible blood in his stools or dark tarry stools.  He said it started after he ate breakfast which was brand cereal and milk.  He denies any fevers.  No history of similar symptoms in the past.  He denies any prior abdominal surgeries.  No associated chest pain or shortness of breath.  He does say that he takes Tylenol daily although he does not take more than the recommended amount.  He says at most he will take 2 tablets of extra strength (500mg ) tablets 3 times a day.  He did take a dose of NyQuil last night in addition to the Tylenol but only 1 dose of the NyQuil (51ml).        Past Medical History:  Diagnosis Date  . Anxiety   . Arthritis   . BPH (benign prostatic hypertrophy)   . CAD (coronary artery disease)    s/p STEMI 10/15/10 w/ Promus DES placed x1 in the first OM  . Colon polyps   . Depression   . Depression with anxiety   . Dupuytren's contracture of hand   . ED (erectile dysfunction)   . GERD (gastroesophageal reflux disease)   . Glaucoma   . HLD (hyperlipidemia)   . Hyperthyroidism   . Hypocontractile bladder   . MI (myocardial infarction) ([place])   . OSA on CPAP   . Peyronie's disease   . Rosacea   . Skin cancer    BCC    Patient Active Problem List   Diagnosis Date Noted  . Abdominal pain 06/27/2020  . Osteopenia 04/19/2020  . Family history of colon cancer in 2  grandparents 10/28/2018  . NCGS (non-celiac gluten sensitivity) ? 10/28/2018  . 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 Hypothyroid 07/26/2018  . GERD (gastroesophageal reflux disease) 07/26/2018  . Barrett's esophagus 07/26/2018  . Rosacea 07/26/2018  . OSA (obstructive sleep apnea) 12/03/2016  . Depression 12/03/2016  . Hyperlipidemia 10/26/2010  . DEPRESSION/ANXIETY 10/26/2010  . GLAUCOMA 10/26/2010  . MYOCARDIAL INFARCTION, ACUTE, INFEROLATERAL 10/26/2010  . CAD, NATIVE VESSEL 10/26/2010  . SINUS BRADYCARDIA 10/26/2010    Past Surgical History:  Procedure Laterality Date  . COLONOSCOPY WITH ESOPHAGOGASTRODUODENOSCOPY (EGD)     multiple  . CORONARY STENT PLACEMENT    . INNER EAR SURGERY    . LASIK  02/1998  . PENILE PROSTHESIS IMPLANT    . TONSILECTOMY, ADENOIDECTOMY, BILATERAL MYRINGOTOMY AND TUBES    . TONSILLECTOMY    . VASECTOMY    . WISDOM TOOTH EXTRACTION         Family History  Problem Relation Age of Onset  . COPD Other   . COPD Mother   . Varicose Veins Father   . Liver cancer Father   . Basal cell carcinoma Father   . Basal cell carcinoma Sister   . Rheum  arthritis Maternal Grandmother   . Heart failure Maternal Grandfather   . Colon cancer Paternal Grandmother   . Arthritis Paternal Grandfather   . Colon cancer Paternal Grandfather   . Melanoma Sister     Social History   Tobacco Use  . Smoking status: Never Smoker  . Smokeless tobacco: Never Used  Vaping Use  . Vaping Use: Never used  Substance Use Topics  . Alcohol use: No  . Drug use: No    Home Medications Prior to Admission medications   Medication Sig Start Date End Date Taking? Authorizing Provider  aspirin EC 81 MG tablet Take by mouth.    [provider]  b complex vitamins capsule Take by mouth.    [provider]  betamethasone valerate ointment (VALISONE) 0.1 %  08/05/19   [provider]  Cholecalciferol (VITAMIN D3) 2000 units TABS  Take 4,000 Units by mouth daily.     [provider]  cyclobenzaprine (FLEXERIL) 10 MG tablet Take 1 tablet (10 mg total) by mouth 3 (three) times daily as needed for muscle spasms. 05/30/20   Margarita Mail, PA-C  methylPREDNISolone (MEDROL DOSEPAK) 4 MG TBPK tablet Use as directed 05/30/20   Margarita Mail, PA-C  Multiple Vitamin (MULTIVITAMIN) tablet Take by mouth.    [provider]  omeprazole (PRILOSEC) 10 MG capsule Take 1 capsule (10 mg total) by mouth daily. 01/04/20   Burchette, Alinda Sierras, MD  SYNTHROID 137 MCG tablet TAKE 1 TABLET EVERY DAY 06/24/20   Burchette, Alinda Sierras, MD  timolol (TIMOPTIC) 0.5 % ophthalmic solution 1 drop 1 day or 1 dose.      [provider]  venlafaxine XR (EFFEXOR XR) 37.5 MG 24 hr capsule Take three capsules once daily 11/13/19   Burchette, Alinda Sierras, MD    Allergies    Crestor [rosuvastatin], Iodinated diagnostic agents, Sulfa antibiotics, and Sulfonamide derivatives  Review of Systems   Review of Systems  Constitutional: Negative for chills, diaphoresis, fatigue and fever.  HENT: Negative for congestion, rhinorrhea and sneezing.   Eyes: Negative.   Respiratory: Negative for cough, chest tightness and shortness of breath.   Cardiovascular: Negative for chest pain and leg swelling.  Gastrointestinal: Positive for abdominal pain and nausea. Negative for blood in stool, diarrhea and vomiting.  Genitourinary: Negative for difficulty urinating, flank pain, frequency and hematuria.  Musculoskeletal: Negative for arthralgias and back pain.  Skin: Negative for rash.  Neurological: Negative for dizziness, speech difficulty, weakness, numbness and headaches.    Physical Exam Updated Vital Signs BP 123/68   Pulse 66   Temp 98.7 F (37.1 C) (Oral)   Resp 18   Ht 6\' 1"  (1.854 m)   Wt (!) 99.8 kg   SpO2 98%   BMI 29.03 kg/m   Physical Exam Constitutional:      Appearance: He is well-developed.  HENT:     Head: Normocephalic and  atraumatic.  Eyes:     Pupils: Pupils are equal, round, and reactive to light.  Cardiovascular:     Rate and Rhythm: Normal rate and regular rhythm.     Heart sounds: Normal heart sounds.  Pulmonary:     Effort: Pulmonary effort is normal. No respiratory distress.     Breath sounds: Normal breath sounds. No wheezing or rales.  Chest:     Chest wall: No tenderness.  Abdominal:     General: Bowel sounds are normal.     Palpations: Abdomen is soft.     Tenderness: There  is abdominal tenderness in the epigastric area. There is no guarding or rebound.  Musculoskeletal:        General: Normal range of motion.     Cervical back: Normal range of motion and neck supple.  Lymphadenopathy:     Cervical: No cervical adenopathy.  Skin:    General: Skin is warm and dry.     Findings: No rash.  Neurological:     Mental Status: He is alert and oriented to person, place, and time.     ED Results / Procedures / Treatments   Labs (all labs ordered are listed, but only abnormal results are displayed) Labs Reviewed  COMPREHENSIVE METABOLIC PANEL - Abnormal; Notable for the following components:      Result Value   Glucose, Bld 179 (*)    AST 206 (*)    ALT 144 (*)    Total Bilirubin 2.1 (*)    All other components within normal limits  CBC - Abnormal; Notable for the following components:   RBC 4.20 (*)    MCV 103.6 (*)    All other components within normal limits  URINALYSIS, ROUTINE W REFLEX MICROSCOPIC - Abnormal; Notable for the following components:   Color, Urine AMBER (*)    APPearance CLOUDY (*)    All other components within normal limits  ACETAMINOPHEN LEVEL - Abnormal; Notable for the following components:   Acetaminophen (Tylenol), Serum <10 (*)    All other components within normal limits  SARS CORONAVIRUS 2 BY RT PCR (HOSPITAL ORDER, [facility])  LIPASE, BLOOD  HEPATITIS PANEL, ACUTE  TROPONIN I (HIGH SENSITIVITY)  TROPONIN I (HIGH  SENSITIVITY)    EKG EKG Interpretation  Date/Time:  Friday June 27 2020 13:14:59 EDT Ventricular Rate:  77 PR Interval:  160 QRS Duration: 78 QT Interval:  390 QTC Calculation: 441 R Axis:   -49 Text Interpretation: Normal sinus rhythm Left axis deviation Low voltage QRS Inferior infarct , age undetermined Abnormal ECG Confirmed by Quintella Reichert [phone]) on 06/27/2020 4:25:49 PM   Radiology CT Abdomen Pelvis Wo Contrast  Result Date: 06/27/2020 CLINICAL DATA:  Abdominal pain. EXAM: CT ABDOMEN AND PELVIS WITHOUT CONTRAST TECHNIQUE: Multidetector CT imaging of the abdomen and pelvis was performed following the standard protocol without IV contrast. COMPARISON:  None. FINDINGS: Lower chest: The lung bases are clear. The heart size is normal. Hepatobiliary: There is decreased hepatic attenuation suggestive of hepatic steatosis. There is questionable mild gallbladder wall thickening and adjacent fat stranding.There is no biliary ductal dilation. Pancreas: Normal contours without ductal dilatation. No peripancreatic fluid collection. Spleen: Unremarkable. Adrenals/Urinary Tract: --Adrenal glands: Unremarkable. --Right kidney/ureter: No hydronephrosis or radiopaque kidney stones. --Left kidney/ureter: No hydronephrosis or radiopaque kidney stones. --Urinary bladder: Unremarkable. Stomach/Bowel: --Stomach/Duodenum: There is mild esophageal wall thickening. --Small bowel: Unremarkable. --Colon: Unremarkable. --Appendix: Normal. Vascular/Lymphatic: Atherosclerotic calcification is present within the non-aneurysmal abdominal aorta, without hemodynamically significant stenosis. --No retroperitoneal lymphadenopathy. --No mesenteric lymphadenopathy. --No pelvic or inguinal lymphadenopathy. Reproductive: A penile prosthesis is noted. Other: No ascites or free air. The abdominal wall is normal. Musculoskeletal. There is an acute appearing compression fracture of the T12 vertebral body with approximately 60%  height loss centrally. There is no retropulsion. IMPRESSION: 1. There are mild inflammatory changes about the gallbladder. Findings could represent acute cholecystitis in the appropriate clinical setting, however this is discordant with the patient's recent ultrasound. If there is clinical suspicion for acute cholecystitis, follow-up with HIDA scan is recommended. 2. Acute appearing compression fracture  of the T12 vertebral body with approximately 60% height loss centrally. There is no retropulsion. Correlation with point tenderness is recommended. 3. Hepatic steatosis. 4. Mild esophageal wall thickening. Correlate for symptoms of esophagitis. Aortic Atherosclerosis (ICD10-I70.0). Electronically Signed   By: Constance Holster M.D.   On: 06/27/2020 22:25   US Abdomen Limited RUQ  Result Date: 06/27/2020 CLINICAL DATA:  Epigastric pain for 1 day EXAM: ULTRASOUND ABDOMEN LIMITED RIGHT UPPER QUADRANT COMPARISON:  None. FINDINGS: Gallbladder: No gallstones or wall thickening visualized. No sonographic Murphy sign noted by sonographer. Common bile duct: Diameter: 6 mm Liver: Diffuse increased echotexture throughout the liver parenchyma consistent with hepatic steatosis. No focal liver parenchymal abnormality. Portal vein is patent on color Doppler imaging with normal direction of blood flow towards the liver. Other: None. IMPRESSION: 1. Hepatic steatosis. 2. Otherwise unremarkable exam. Electronically Signed   By: Randa Ngo M.D.   On: 06/27/2020 19:14    Procedures Procedures (including critical care time)  Medications Ordered in ED Medications  sodium chloride flush (NS) 0.9 % injection 3 mL (3 mLs Intravenous Given 06/27/20 1820)  pantoprazole (PROTONIX) injection 40 mg (40 mg Intravenous Given 06/27/20 1841)  Barium Sulfate 2.1 % SUSP (  Given by Other 06/27/20 2009)  ondansetron (ZOFRAN) injection 4 mg (4 mg Intravenous Given 06/27/20 2326)    ED Course  I have reviewed the triage vital signs and  the nursing notes.  Pertinent labs & imaging results that were available during my care of the patient were reviewed by me and considered in my medical decision making (see chart for details).    MDM Rules/Calculators/A&P                          Patient is a 69 year old male who presents with epigastric pain.  He has elevation in his LFTs.  No evidence of pancreatitis.  He had a gallbladder ultrasound which showed some hepatic steatosis but no evidence of gallbladder disease.  He had a CT scan of the abdomen pelvis which showed some pericholecystic fluid and concerns for cholecystitis although this was not visualized on the ultrasound.  He has had some persistent nausea and actually some vomiting in the ED.  He was given Zofran and some IV fluids.  I spoke with Dr. Kae Heller with general surgery who requested medicine admission.  She will see the patient but thinks the patient may need MRCP or further evaluation of his gallbladder.  I spoke with Dr. Hal Hope who admit the patient for further treatment.  I did ask him about his back pain.  He has some chronic back pain related to a back injury 40 years ago.  He does not have any new pain or change in his pain from his chronic back pain.  He does not have any recent injuries that would be more concerning for an acute compression fracture of his T12 spine. Final Clinical Impression(s) / ED Diagnoses Final diagnoses:  Epigastric abdominal pain  Elevated LFTs    Rx / DC Orders ED Discharge Orders    None       Malvin Johns, MD 06/27/20 [phone]

## 2020-06-27 NOTE — ED Notes (Addendum)
RN called lab to inquire about pending hepatosis lab work. Lab stated the blood work is in process.

## 2020-06-27 NOTE — ED Notes (Signed)
Paged by Pollie Friar

## 2020-06-28 ENCOUNTER — Encounter (HOSPITAL_COMMUNITY): Payer: Self-pay | Admitting: Internal Medicine

## 2020-06-28 ENCOUNTER — Inpatient Hospital Stay (HOSPITAL_COMMUNITY): Payer: Medicare Other

## 2020-06-28 DIAGNOSIS — R1013 Epigastric pain: Secondary | ICD-10-CM

## 2020-06-28 DIAGNOSIS — I2510 Atherosclerotic heart disease of native coronary artery without angina pectoris: Secondary | ICD-10-CM

## 2020-06-28 DIAGNOSIS — R7989 Other specified abnormal findings of blood chemistry: Secondary | ICD-10-CM | POA: Diagnosis present

## 2020-06-28 DIAGNOSIS — R932 Abnormal findings on diagnostic imaging of liver and biliary tract: Secondary | ICD-10-CM

## 2020-06-28 DIAGNOSIS — E039 Hypothyroidism, unspecified: Secondary | ICD-10-CM | POA: Diagnosis not present

## 2020-06-28 DIAGNOSIS — R945 Abnormal results of liver function studies: Secondary | ICD-10-CM

## 2020-06-28 DIAGNOSIS — R109 Unspecified abdominal pain: Secondary | ICD-10-CM | POA: Diagnosis not present

## 2020-06-28 LAB — BASIC METABOLIC PANEL
Anion gap: 10 (ref 5–15)
BUN: 13 mg/dL (ref 8–23)
CO2: 26 mmol/L (ref 22–32)
Calcium: 8.8 mg/dL — ABNORMAL LOW (ref 8.9–10.3)
Chloride: 100 mmol/L (ref 98–111)
Creatinine, Ser: 1.08 mg/dL (ref 0.61–1.24)
GFR calc Af Amer: >60 mL/min (ref >60)
GFR calc non Af Amer: >60 mL/min (ref >60)
Glucose, Bld: 129 mg/dL — ABNORMAL HIGH (ref 70–99)
Potassium: 4.1 mmol/L (ref 3.5–5.1)
Sodium: 136 mmol/L (ref 135–145)

## 2020-06-28 LAB — CBC
HCT: 42.3 % (ref 39.0–52.0)
Hemoglobin: 14.5 g/dL (ref 13.0–17.0)
MCH: 35.1 pg — ABNORMAL HIGH (ref 26.0–34.0)
MCHC: 34.3 g/dL (ref 30.0–36.0)
MCV: 102.4 fL — ABNORMAL HIGH (ref 80.0–100.0)
Platelets: 183 10*3/uL (ref 150–400)
RBC: 4.13 MIL/uL — ABNORMAL LOW (ref 4.22–5.81)
RDW: 12.2 % (ref 11.5–15.5)
WBC: 9.2 10*3/uL (ref 4.0–10.5)
nRBC: 0 % (ref 0.0–0.2)

## 2020-06-28 LAB — HEPATITIS PANEL, ACUTE
HCV Ab: NONREACTIVE
Hep A IgM: NONREACTIVE
Hep B C IgM: NONREACTIVE
Hepatitis B Surface Ag: NONREACTIVE

## 2020-06-28 LAB — PROTIME-INR
INR: 1.2 (ref 0.8–1.2)
Prothrombin Time: 14.5 seconds (ref 11.4–15.2)

## 2020-06-28 LAB — HEPATIC FUNCTION PANEL
ALT: 913 U/L — ABNORMAL HIGH (ref 0–44)
AST: 840 U/L — ABNORMAL HIGH (ref 15–41)
Albumin: 3.7 g/dL (ref 3.5–5.0)
Alkaline Phosphatase: 122 U/L (ref 38–126)
Bilirubin, Direct: 1.4 mg/dL — ABNORMAL HIGH (ref 0.0–0.2)
Indirect Bilirubin: 2.4 mg/dL — ABNORMAL HIGH (ref 0.3–0.9)
Total Bilirubin: 3.8 mg/dL — ABNORMAL HIGH (ref 0.3–1.2)
Total Protein: 6.7 g/dL (ref 6.5–8.1)

## 2020-06-28 LAB — TROPONIN I (HIGH SENSITIVITY): Troponin I (High Sensitivity): 25 ng/L — ABNORMAL HIGH (ref <18)

## 2020-06-28 LAB — SARS CORONAVIRUS 2 BY RT PCR (HOSPITAL ORDER, PERFORMED IN ~~LOC~~ HOSPITAL LAB): SARS Coronavirus 2: NEGATIVE

## 2020-06-28 MED ORDER — LEVOTHYROXINE SODIUM 100 MCG/5ML IV SOLN
68.5000 ug | Freq: Every day | INTRAVENOUS | Status: DC
  Administered 2020-06-30: 68.5 ug via INTRAVENOUS
  Filled 2020-06-28 (×2): qty 5

## 2020-06-28 MED ORDER — ONDANSETRON HCL 4 MG/2ML IJ SOLN
4.0000 mg | Freq: Four times a day (QID) | INTRAMUSCULAR | Status: DC | PRN
  Administered 2020-06-28 – 2020-06-30 (×3): 4 mg via INTRAVENOUS
  Filled 2020-06-28 (×3): qty 2

## 2020-06-28 MED ORDER — TIMOLOL MALEATE 0.5 % OP SOLN
1.0000 [drp] | Freq: Two times a day (BID) | OPHTHALMIC | Status: DC
  Administered 2020-06-28 – 2020-07-01 (×7): 1 [drp] via OPHTHALMIC
  Filled 2020-06-28: qty 5

## 2020-06-28 MED ORDER — ONDANSETRON HCL 4 MG PO TABS
4.0000 mg | ORAL_TABLET | Freq: Four times a day (QID) | ORAL | Status: DC | PRN
  Administered 2020-06-29 – 2020-06-30 (×2): 4 mg via ORAL
  Filled 2020-06-28 (×2): qty 1

## 2020-06-28 MED ORDER — SODIUM CHLORIDE 0.9 % IV SOLN: INTRAVENOUS | Status: AC

## 2020-06-28 MED ORDER — PIPERACILLIN-TAZOBACTAM 3.375 G IVPB
3.3750 g | Freq: Three times a day (TID) | INTRAVENOUS | Status: DC
  Administered 2020-06-28 – 2020-07-01 (×10): 3.375 g via INTRAVENOUS
  Filled 2020-06-28 (×10): qty 50

## 2020-06-28 MED ORDER — PIPERACILLIN-TAZOBACTAM 3.375 G IVPB 30 MIN
3.3750 g | Freq: Once | INTRAVENOUS | Status: AC
  Administered 2020-06-28: 3.375 g via INTRAVENOUS
  Filled 2020-06-28: qty 50

## 2020-06-28 MED ORDER — MORPHINE SULFATE (PF) 2 MG/ML IV SOLN
2.0000 mg | INTRAVENOUS | Status: DC | PRN
  Administered 2020-06-28 (×4): 2 mg via INTRAVENOUS
  Filled 2020-06-28 (×4): qty 1

## 2020-06-28 MED ORDER — ACETAMINOPHEN 325 MG PO TABS
650.0000 mg | ORAL_TABLET | Freq: Four times a day (QID) | ORAL | Status: DC | PRN
  Administered 2020-06-28: 650 mg via ORAL
  Filled 2020-06-28: qty 2

## 2020-06-28 NOTE — Progress Notes (Signed)
Subjective: Patient admitted this morning, see detailed H&P by Dr Hal Hope 69 year old male with a history of CAD s/p stent placement, hypothyroidism came to ED with complaints of sudden onset abdominal pain since yesterday morning.  Patient had nausea vomiting but no diarrhea.  In the ED CT abdomen pelvis was concerning for cholecystitis.  MRCP abdomen showed hepatic steatosis and some gallbladder wall thickening.  Abdominal ultrasound also did not show acute cholecystitis.  General surgery was consulted, HIDA scan has been obtained and is currently pending. Patient has history of back pain and was recently prescribed Solu-Medrol Dosepak by ED provider 3 weeks ago.  He has not taken any other medications.  No previous history hepatitis, no alcohol abuse.  Patient has been taking Tylenol almost 3 g every day however Tylenol level was normal.  Vitals:   06/28/20 0723 06/28/20 0733  BP:    Pulse: 75 81  Resp: 15 12  Temp:    SpO2: 97% 98%      A/P Transaminitis-unclear etiology, will consult GI for further valuation.  Hepatitis panel is negative.  Tylenol level is normal. ?  Cholecystitis-HIDA scan has been ordered and is currently pending.  General surgery following. T12 compression fracture-patient has chronic back pain, will obtain MRI thoracic spine once patient is more stable.  Continue pain management. Hypothyroidism-continue IV Synthroid.    [facility] [phone]

## 2020-06-28 NOTE — ED Notes (Signed)
[facility] called to inform that they will not be able to perform patient's scan until Monday due to not having required product. Dr. Darrick Meigs made aware by this RN.

## 2020-06-28 NOTE — H&P (Signed)
History and Physical    Matthew Pratt HAL:[id number] DOB: 01-30-1951 DOA: 06/27/2020  PCP: [name], MD  Patient coming from: Home.  Chief Complaint: Abdominal pain.  HPI: Matthew Pratt is a 69 y.o. male with history of CAD status post stenting, hypothyroidism presents to the ER with complaints of sudden onset of abdominal pain since yesterday morning after eating breakfast.  Nausea vomiting but denies any diarrhea.  Abdominal pain is mostly in the epigastric area.  No fever or chills.  Denies chest pain or shortness of breath.  Pain is severe sharp stabbing in nature.  Patient was recently placed on prednisone for low back pain after a fall.  ED Course: In the ER patient was afebrile and labs show AST of 206 ALT 144 and total bilirubin 2.1.  Tylenol levels were negative sonogram of the abdomen was unremarkable.  CT abdomen pelvis shows features concerning for cholecystitis also acute T12 compression fracture.  On-call general surgeon Dr. Windle Guard was consulted at this time requested getting further work-up to rule out choledocholithiasis and will be seeing patient in consult.  Covid test was negative EKG showed normal sinus rhythm.  High sensitive troponin was 11.  Review of Systems: As per HPI, rest all negative.   Past Medical History:  Diagnosis Date  . Anxiety   . Arthritis   . BPH (benign prostatic hypertrophy)   . CAD (coronary artery disease)    s/p STEMI 10/15/10 w/ Promus DES placed x1 in the first OM  . Colon polyps   . Depression   . Depression with anxiety   . Dupuytren's contracture of hand   . ED (erectile dysfunction)   . GERD (gastroesophageal reflux disease)   . Glaucoma   . HLD (hyperlipidemia)   . Hyperthyroidism   . Hypocontractile bladder   . MI (myocardial infarction) ([place])   . OSA on CPAP   . Peyronie's disease   . Rosacea   . Skin cancer    BCC    Past Surgical History:  Procedure Laterality Date  . COLONOSCOPY WITH  ESOPHAGOGASTRODUODENOSCOPY (EGD)     multiple  . CORONARY STENT PLACEMENT    . INNER EAR SURGERY    . LASIK  02/1998  . PENILE PROSTHESIS IMPLANT    . TONSILECTOMY, ADENOIDECTOMY, BILATERAL MYRINGOTOMY AND TUBES    . TONSILLECTOMY    . VASECTOMY    . WISDOM TOOTH EXTRACTION       reports that he has never smoked. He has never used smokeless tobacco. He reports that he does not drink alcohol and does not use drugs.  Allergies  Allergen Reactions  . Crestor [Rosuvastatin]     Myalgia. Myalgia.   . Iodinated Diagnostic Agents Hives    Makes PT itchy  . Sulfa Antibiotics     REACTION: rash  . Sulfonamide Derivatives     REACTION: rash    Family History  Problem Relation Age of Onset  . COPD Other   . COPD Mother   . Varicose Veins Father   . Liver cancer Father   . Basal cell carcinoma Father   . Basal cell carcinoma Sister   . Rheum arthritis Maternal Grandmother   . Heart failure Maternal Grandfather   . Colon cancer Paternal Grandmother   . Arthritis Paternal Grandfather   . Colon cancer Paternal Grandfather   . Melanoma Sister     Prior to Admission medications   Medication Sig Start Date End Date Taking? Authorizing Provider  aspirin EC 81 MG tablet Take 81 mg by mouth daily.    Yes [provider]  latanoprost (XALATAN) 0.005 % ophthalmic solution Place 1 drop into both eyes at bedtime. 05/15/20  Yes [provider]  omeprazole (PRILOSEC) 10 MG capsule Take 1 capsule (10 mg total) by mouth daily. Patient taking differently: Take 10 mg by mouth every evening.  01/04/20  Yes Burchette, Alinda Sierras, MD  SYNTHROID 137 MCG tablet TAKE 1 TABLET EVERY DAY Patient taking differently: Take 137 mcg by mouth daily before breakfast.  06/24/20  Yes Burchette, Alinda Sierras, MD  timolol (TIMOPTIC) 0.5 % ophthalmic solution Place 1 drop into both eyes 2 (two) times daily.    Yes [provider]  venlafaxine XR (EFFEXOR XR) 37.5 MG 24 hr capsule Take three  capsules once daily Patient taking differently: Take 112.5 mg by mouth every evening.  11/13/19  Yes Burchette, Alinda Sierras, MD  cyclobenzaprine (FLEXERIL) 10 MG tablet Take 1 tablet (10 mg total) by mouth 3 (three) times daily as needed for muscle spasms. Patient not taking: Reported on 06/27/2020 05/30/20   Margarita Mail, PA-C  methylPREDNISolone (MEDROL DOSEPAK) 4 MG TBPK tablet Use as directed Patient not taking: Reported on 06/27/2020 05/30/20   Margarita Mail, PA-C    Physical Exam: Constitutional: Moderately built and nourished. Vitals:   06/27/20 2000 06/27/20 2101 06/27/20 2317 06/28/20 0000  BP: (!) 132/84 123/68 (!) 161/96 (!) 160/95  Pulse: 88 66 91 93  Resp: 22 18 18 19   Temp:      TempSrc:      SpO2: 94% 98% 98% 97%  Weight:      Height:       Eyes: Anicteric no pallor. ENMT: No discharge from the ears eyes nose or mouth. Neck: No mass felt.  No neck rigidity. Respiratory: No rhonchi or crepitations.   Cardiovascular: S1-S2 heard. Abdomen: Soft mild epigastric tenderness no guarding or rigidity. Musculoskeletal: No edema. Skin: No rash. Neurologic: Alert awake oriented to time place and person.  Moves all extremities. Psychiatric: Appears normal per normal affect.   Labs on Admission: I have personally reviewed following labs and imaging studies  CBC: Recent Labs  Lab 06/27/20 1313  WBC 7.7  HGB 14.3  HCT 43.5  MCV 103.6*  PLT 638   Basic Metabolic Panel: Recent Labs  Lab 06/27/20 1313  NA 137  K 4.3  CL 101  CO2 27  GLUCOSE 179*  BUN 12  CREATININE 1.09  CALCIUM 9.0   GFR: Estimated Creatinine Clearance: 77.3 mL/min (by C-G formula based on SCr of 1.09 mg/dL). Liver Function Tests: Recent Labs  Lab 06/27/20 1313  AST 206*  ALT 144*  ALKPHOS 91  BILITOT 2.1*  PROT 6.6  ALBUMIN 3.7   Recent Labs  Lab 06/27/20 1313  LIPASE 39   No results for input(s): AMMONIA in the last 168 hours. Coagulation Profile: No results for input(s): INR,  PROTIME in the last 168 hours. Cardiac Enzymes: No results for input(s): CKTOTAL, CKMB, CKMBINDEX, TROPONINI in the last 168 hours. BNP (last 3 results) No results for input(s): PROBNP in the last 8760 hours. HbA1C: No results for input(s): HGBA1C in the last 72 hours. CBG: No results for input(s): GLUCAP in the last 168 hours. Lipid Profile: No results for input(s): CHOL, HDL, LDLCALC, TRIG, CHOLHDL, LDLDIRECT in the last 72 hours. Thyroid Function Tests: No results for input(s): TSH, T4TOTAL, FREET4, T3FREE, THYROIDAB in the last 72 hours. Anemia Panel: No results for  input(s): VITAMINB12, FOLATE, FERRITIN, TIBC, IRON, RETICCTPCT in the last 72 hours. Urine analysis:    Component Value Date/Time   COLORURINE AMBER (A) 06/27/2020 1642   APPEARANCEUR CLOUDY (A) 06/27/2020 1642   LABSPEC 1.020 06/27/2020 1642   PHURINE 8.0 06/27/2020 1642   GLUCOSEU NEGATIVE 06/27/2020 1642   HGBUR NEGATIVE 06/27/2020 1642   BILIRUBINUR NEGATIVE 06/27/2020 1642   KETONESUR NEGATIVE 06/27/2020 1642   PROTEINUR NEGATIVE 06/27/2020 1642   NITRITE NEGATIVE 06/27/2020 1642   LEUKOCYTESUR NEGATIVE 06/27/2020 1642   Sepsis Labs: @LABRCNTIP (procalcitonin:4,lacticidven:4) )No results found for this or any previous visit (from the past 240 hour(s)).   Radiological Exams on Admission: CT Abdomen Pelvis Wo Contrast  Result Date: 06/27/2020 CLINICAL DATA:  Abdominal pain. EXAM: CT ABDOMEN AND PELVIS WITHOUT CONTRAST TECHNIQUE: Multidetector CT imaging of the abdomen and pelvis was performed following the standard protocol without IV contrast. COMPARISON:  None. FINDINGS: Lower chest: The lung bases are clear. The heart size is normal. Hepatobiliary: There is decreased hepatic attenuation suggestive of hepatic steatosis. There is questionable mild gallbladder wall thickening and adjacent fat stranding.There is no biliary ductal dilation. Pancreas: Normal contours without ductal dilatation. No peripancreatic  fluid collection. Spleen: Unremarkable. Adrenals/Urinary Tract: --Adrenal glands: Unremarkable. --Right kidney/ureter: No hydronephrosis or radiopaque kidney stones. --Left kidney/ureter: No hydronephrosis or radiopaque kidney stones. --Urinary bladder: Unremarkable. Stomach/Bowel: --Stomach/Duodenum: There is mild esophageal wall thickening. --Small bowel: Unremarkable. --Colon: Unremarkable. --Appendix: Normal. Vascular/Lymphatic: Atherosclerotic calcification is present within the non-aneurysmal abdominal aorta, without hemodynamically significant stenosis. --No retroperitoneal lymphadenopathy. --No mesenteric lymphadenopathy. --No pelvic or inguinal lymphadenopathy. Reproductive: A penile prosthesis is noted. Other: No ascites or free air. The abdominal wall is normal. Musculoskeletal. There is an acute appearing compression fracture of the T12 vertebral body with approximately 60% height loss centrally. There is no retropulsion. IMPRESSION: 1. There are mild inflammatory changes about the gallbladder. Findings could represent acute cholecystitis in the appropriate clinical setting, however this is discordant with the patient's recent ultrasound. If there is clinical suspicion for acute cholecystitis, follow-up with HIDA scan is recommended. 2. Acute appearing compression fracture of the T12 vertebral body with approximately 60% height loss centrally. There is no retropulsion. Correlation with point tenderness is recommended. 3. Hepatic steatosis. 4. Mild esophageal wall thickening. Correlate for symptoms of esophagitis. Aortic Atherosclerosis (ICD10-I70.0). Electronically Signed   By: Constance Holster M.D.   On: 06/27/2020 22:25   US Abdomen Limited RUQ  Result Date: 06/27/2020 CLINICAL DATA:  Epigastric pain for 1 day EXAM: ULTRASOUND ABDOMEN LIMITED RIGHT UPPER QUADRANT COMPARISON:  None. FINDINGS: Gallbladder: No gallstones or wall thickening visualized. No sonographic Murphy sign noted by sonographer.  Common bile duct: Diameter: 6 mm Liver: Diffuse increased echotexture throughout the liver parenchyma consistent with hepatic steatosis. No focal liver parenchymal abnormality. Portal vein is patent on color Doppler imaging with normal direction of blood flow towards the liver. Other: None. IMPRESSION: 1. Hepatic steatosis. 2. Otherwise unremarkable exam. Electronically Signed   By: Randa Ngo M.D.   On: 06/27/2020 19:14    EKG: Independently reviewed.  Normal sinus rhythm.  Assessment/Plan Principal Problem:   Abdominal pain Active Problems:   CAD, NATIVE VESSEL   OSA (obstructive sleep apnea)   Hypothyroid   Elevated LFTs    1. Abdominal pain concerning for acute cholecystitis since patient has elevated LFTs we will get MRCP to rule out choledocholithiasis we will keep patient n.p.o. and antibiotics for now IV fluids pain relief medications.  General surgery to see patient in  consult. 2. Acute T12 compression fracture which could also contribute to patient's pain.  If work-up further biliary causes negative then may need to get MRI T-spine. 3. CAD status post stenting denies any chest pain. 4. Hypothyroidism on Synthroid which have dosed IV. 5. Elevated LFTs likely from #1.  Since patient has ongoing abdominal pain with concern for cholecystitis and choledocholithiasis will need further work-up and more than 2 midnight stay in inpatient status.   DVT prophylaxis: SCDs for now in anticipation of procedure will avoid anticoagulation. Code Status: Full code. Family Communication: Discussed with patient. Disposition Plan: Home. Consults called: General surgery. Admission status: Inpatient.   Rise Patience MD [facility] Pager [phone].  If 7PM-7AM, please contact night-coverage [email or web address] Password [facility]  06/28/2020, 12:36 AM

## 2020-06-28 NOTE — Progress Notes (Signed)
Pharmacy Antibiotic Note  Matthew Pratt is a 69 y.o. male admitted on 06/27/2020 with intra abdominal infection.  Pharmacy has been consulted for zosyn dosing.  Plan: Zosyn 3.375g IV q8h (4 hour infusion).  F/u cultures and clinical course  Height: 6\' 1"  (185.4 cm) Weight: (!) 99.8 kg (220 lb) IBW/kg (Calculated) : 79.9  Temp (24hrs), Avg:98.4 F (36.9 C), Min:98 F (36.7 C), Max:98.7 F (37.1 C)  Recent Labs  Lab 06/27/20 1313  WBC 7.7  CREATININE 1.09    Estimated Creatinine Clearance: 77.3 mL/min (by C-G formula based on SCr of 1.09 mg/dL).    Allergies  Allergen Reactions   Crestor [Rosuvastatin]     Myalgia. Myalgia.    Iodinated Diagnostic Agents Hives    Makes PT itchy   Sulfa Antibiotics     REACTION: rash   Sulfonamide Derivatives     REACTION: rash    Thank you for allowing pharmacy to be a part of this patients care.  Excell Seltzer Poteet 06/28/2020 12:42 AM

## 2020-06-28 NOTE — ED Notes (Signed)
Pt complaining of abdominal and back pain. RN messaged Dr. Hal Hope to see if pt can get pain medication.

## 2020-06-28 NOTE — Progress Notes (Signed)
HISTORY OF PRESENT ILLNESS:  Matthew Pratt is a 69 y.o. male admitted to the hospital yesterday with abdominal pain and abnormal liver test.  See that dictation.  Patient just finished walking in the halls.  Feeling better.  Tolerating liquid diet.  No new problems.  Liver tests have improved.  Plans for HIDA scan tomorrow per surgery  REVIEW OF SYSTEMS:  All non-GI ROS negative.  Past Medical History:  Diagnosis Date  . Anxiety   . Arthritis   . BPH (benign prostatic hypertrophy)   . CAD (coronary artery disease)    s/p STEMI 10/15/10 w/ Promus DES placed x1 in the first OM  . Colon polyps   . Depression   . Depression with anxiety   . Dupuytren's contracture of hand   . ED (erectile dysfunction)   . GERD (gastroesophageal reflux disease)   . Glaucoma   . HLD (hyperlipidemia)   . Hyperthyroidism   . Hypocontractile bladder   . MI (myocardial infarction) ([place])   . OSA on CPAP   . Peyronie's disease   . Rosacea   . Skin cancer    BCC    Past Surgical History:  Procedure Laterality Date  . COLONOSCOPY WITH ESOPHAGOGASTRODUODENOSCOPY (EGD)     multiple  . CORONARY STENT PLACEMENT    . INNER EAR SURGERY    . LASIK  02/1998  . PENILE PROSTHESIS IMPLANT    . TONSILECTOMY, ADENOIDECTOMY, BILATERAL MYRINGOTOMY AND TUBES    . TONSILLECTOMY    . VASECTOMY    . WISDOM TOOTH EXTRACTION      Social History Dorsie Burich Jentz  reports that he has never smoked. He has never used smokeless tobacco. He reports that he does not drink alcohol and does not use drugs.  family history includes Arthritis in his paternal grandfather; Basal cell carcinoma in his father and sister; COPD in his mother and another family member; Colon cancer in his paternal grandfather and paternal grandmother; Heart failure in his maternal grandfather; Liver cancer in his father; Melanoma in his sister; Rheum arthritis in his maternal grandmother; Varicose Veins in his father.  Allergies  Allergen  Reactions  . Crestor [Rosuvastatin]     Myalgia. Myalgia.   . Iodinated Diagnostic Agents Hives    Makes PT itchy  . Sulfa Antibiotics     REACTION: rash  . Sulfonamide Derivatives     REACTION: rash       PHYSICAL EXAMINATION: Vital signs: BP (!) 105/62 (BP Location: Left Arm)   Pulse 50   Temp 98.3 F (36.8 C) (Oral)   Resp 16   Ht 6\' 1"  (1.854 m)   Wt (!) 99.8 kg   SpO2 96%   BMI 29.03 kg/m   Constitutional: generally well-appearing, no acute distress Psychiatric: alert and oriented x3, cooperative Eyes: extraocular movements intact, anicteric, conjunctiva pink Mouth: oral pharynx moist, no lesions Neck: supple no lymphadenopathy Cardiovascular: heart regular rate and rhythm, no murmur Lungs: clear to auscultation bilaterally Abdomen: soft, nontender, nondistended, no obvious ascites, no peritoneal signs, normal bowel sounds, no organomegaly Rectal: Omitted Extremities: no lower extremity edema bilaterally Skin: no lesions on visible extremities Neuro: No focal deficits. No asterixis.    ASSESSMENT:  1.  Acute abdominal pain with elevated liver test.  Calculus cholecystitis versus acute hepatitis, such as viral.  Standard viral serologies negative   PLAN:  1.  Continue to trend liver tests 2.  Follow-up additional viral serologies 3.  HIDA scan per surgery 4.  GI will continue to follow  Docia Chuck. Geri Seminole., M.D. [facility] Division of Gastroenterology

## 2020-06-28 NOTE — Progress Notes (Signed)
   06/28/20 2052  Assess: MEWS Score  Temp (!) 101.5 F (38.6 C)  BP 110/68  Pulse Rate 68  Resp 14  Level of Consciousness Alert  SpO2 100 %  O2 Device Room Air  Assess: MEWS Score  MEWS Temp 2  MEWS Systolic 0  MEWS Pulse 0  MEWS RR 0  MEWS LOC 0  MEWS Score 2  MEWS Score Color Yellow  Assess: if the MEWS score is Yellow or Red  Were vital signs taken at a resting state? Yes  Focused Assessment Change from prior assessment (see assessment flowsheet)  Early Detection of Sepsis Score *See Row Information* Low  MEWS guidelines implemented *See Row Information* Yes  Treat  MEWS Interventions Administered prn meds/treatments  Take Vital Signs  Increase Vital Sign Frequency  Yellow: Q 2hr X 2 then Q 4hr X 2, if remains yellow, continue Q 4hrs  Escalate  MEWS: Escalate Yellow: discuss with charge nurse/RN and consider discussing with provider and RRT  Notify: Charge Nurse/RN  Name of Charge Nurse/RN Notified Laynee Lockamy,RN  Date Charge Nurse/RN Notified 06/28/20  Time Charge Nurse/RN Notified 2101  Notify: Provider  Provider Name/Title Dr Tonie Griffith  Date Provider Notified 06/28/20  Time Provider Notified 2101  Notification Type Page  Notification Reason Other (Comment) (elevated temp)  Response See new orders  Date of Provider Response 06/28/20  Time of Provider Response 2107  Document  Patient Outcome Other (Comment)  Progress note created (see row info) Yes

## 2020-06-28 NOTE — Consult Note (Signed)
Surgical Evaluation Requesting provider: Dr. Tamera Punt  Chief Complaint: abdominal pain  HPI: Very pleasant 69 year old man with a medical history notable below for MI/CAD status post stent 10 years ago, who experienced acute onset upper abdominal pain yesterday morning after eating breakfast.  The pain was sharp and intense, and was very localized to the region just inferior to the xiphoid process.  Did not radiate.  This was associated with nausea but no vomiting at the time however last night in the emergency department he did vomit twice.  Denied chest pain or shortness of breath.  He denies any change in his bowel movements, denied melena or hematochezia.  His breakfast consisted of bran cereal with fruit and milk.  The night before his dinner was a salad.  He states he has had mild similar episodes in the past but this is by far the worst.  He does endorse daily Tylenol but does not exceed the recommended amount.  No prior abdominal surgeries.  He did initially undergo a right upper quadrant ultrasound when his initial labs demonstrated elevated LFTs.  The ultrasound was positive for hepatic steatosis but there was no cholelithiasis and no evidence of cholecystitis on that study.  Subsequently he had a CT scan of the abdomen which does show some pericholecystic fluid and concern for cholecystitis.  Additional finding included a acute compression fracture of his T12 spine although the patient endorses chronic back pain with no recent injuries or change in his chronic pain.  Finally, an MRI was completed this morning which shows diffuse hepatic steatosis without focal liver lesion, gallbladder wall thickening up to 6 mm with no gallstones, fusiform dilatation of the common bile duct up to 1 cm with mild central biliary prominence, and focal narrowing of the distal CBD just before the ampulla but no choledocholithiasis or obstructing mass noted in the pancreas.  His labs have included a negative hepatitis  panel, normal CBC x2, normal BMP x2.  Initial AST/ALT 206/144, initial bilirubin 2.1.  Follow-up labs this morning demonstrate increase in AST to 840, increase in ALT to 913 and increase in total bilirubin to 3.4 with an increased indirect component.  This morning his abdominal pain and nausea are resolved although he is having back pain from spending the night on the ER stretcher.  Allergies  Allergen Reactions  . Crestor [Rosuvastatin]     Myalgia. Myalgia.   . Iodinated Diagnostic Agents Hives    Makes PT itchy  . Sulfa Antibiotics     REACTION: rash  . Sulfonamide Derivatives     REACTION: rash    Past Medical History:  Diagnosis Date  . Anxiety   . Arthritis   . BPH (benign prostatic hypertrophy)   . CAD (coronary artery disease)    s/p STEMI 10/15/10 w/ Promus DES placed x1 in the first OM  . Colon polyps   . Depression   . Depression with anxiety   . Dupuytren's contracture of hand   . ED (erectile dysfunction)   . GERD (gastroesophageal reflux disease)   . Glaucoma   . HLD (hyperlipidemia)   . Hyperthyroidism   . Hypocontractile bladder   . MI (myocardial infarction) ([place])   . OSA on CPAP   . Peyronie's disease   . Rosacea   . Skin cancer    BCC    Past Surgical History:  Procedure Laterality Date  . COLONOSCOPY WITH ESOPHAGOGASTRODUODENOSCOPY (EGD)     multiple  . CORONARY STENT PLACEMENT    .  INNER EAR SURGERY    . LASIK  02/1998  . PENILE PROSTHESIS IMPLANT    . TONSILECTOMY, ADENOIDECTOMY, BILATERAL MYRINGOTOMY AND TUBES    . TONSILLECTOMY    . VASECTOMY    . WISDOM TOOTH EXTRACTION      Family History  Problem Relation Age of Onset  . COPD Other   . COPD Mother   . Varicose Veins Father   . Liver cancer Father   . Basal cell carcinoma Father   . Basal cell carcinoma Sister   . Rheum arthritis Maternal Grandmother   . Heart failure Maternal Grandfather   . Colon cancer Paternal Grandmother   . Arthritis Paternal Grandfather   . Colon  cancer Paternal Grandfather   . Melanoma Sister     Social History   Socioeconomic History  . Marital status: Divorced    Spouse name: Not on file  . Number of children: 2  . Years of education: Not on file  . Highest education level: Not on file  Occupational History  . Occupation: International aid/development worker    Comment: retired  Tobacco Use  . Smoking status: Never Smoker  . Smokeless tobacco: Never Used  Vaping Use  . Vaping Use: Never used  Substance and Sexual Activity  . Alcohol use: No  . Drug use: No  . Sexual activity: Not on file  Other Topics Concern  . Not on file  Social History Narrative   Divorced, 2 children   Retired Chief Financial Officer   No EtOH, tobacco, drugs   Social Determinants of Radio broadcast assistant Strain:   . Difficulty of Paying Living Expenses:   Food Insecurity:   . Worried About Charity fundraiser in the Last Year:   . Arboriculturist in the Last Year:   Transportation Needs:   . Film/video editor (Medical):   Marland Kitchen Lack of Transportation (Non-Medical):   Physical Activity:   . Days of Exercise per Week:   . Minutes of Exercise per Session:   Stress:   . Feeling of Stress :   Social Connections:   . Frequency of Communication with Friends and Family:   . Frequency of Social Gatherings with Friends and Family:   . Attends Religious Services:   . Active Member of Clubs or Organizations:   . Attends Archivist Meetings:   Marland Kitchen Marital Status:     No current facility-administered medications on file prior to encounter.   Current Outpatient Medications on File Prior to Encounter  Medication Sig Dispense Refill  . aspirin EC 81 MG tablet Take 81 mg by mouth daily.     Marland Kitchen latanoprost (XALATAN) 0.005 % ophthalmic solution Place 1 drop into both eyes at bedtime.    Marland Kitchen omeprazole (PRILOSEC) 10 MG capsule Take 1 capsule (10 mg total) by mouth daily. (Patient taking differently: Take 10 mg by mouth every evening. ) 90 capsule 3   . SYNTHROID 137 MCG tablet TAKE 1 TABLET EVERY DAY (Patient taking differently: Take 137 mcg by mouth daily before breakfast. ) 90 tablet 1  . timolol (TIMOPTIC) 0.5 % ophthalmic solution Place 1 drop into both eyes 2 (two) times daily.     Marland Kitchen venlafaxine XR (EFFEXOR XR) 37.5 MG 24 hr capsule Take three capsules once daily (Patient taking differently: Take 112.5 mg by mouth every evening. ) 270 capsule 3  . cyclobenzaprine (FLEXERIL) 10 MG tablet Take 1 tablet (10 mg total) by mouth 3 (three)  times daily as needed for muscle spasms. (Patient not taking: Reported on 06/27/2020) 21 tablet 0  . methylPREDNISolone (MEDROL DOSEPAK) 4 MG TBPK tablet Use as directed (Patient not taking: Reported on 06/27/2020) 21 tablet 0    Review of Systems: a complete, 10pt review of systems was completed with pertinent positives and negatives as documented in the HPI  Physical Exam: Vitals:   06/28/20 0410 06/28/20 0600  BP: 122/77 123/83  Pulse: 90 83  Resp: 20 15  Temp:    SpO2: 95% 94%   Gen: A&Ox3, no distress  Eyes: lids and conjunctivae normal, no icterus. Pupils equally round and reactive to light.  Neck: supple without mass or thyromegaly Chest: respiratory effort is normal. No crepitus or tenderness on palpation of the chest. Breath sounds equal.  Cardiovascular: RRR with palpable distal pulses, no pedal edema Gastrointestinal: soft, nondistended, nontender. No mass, hepatomegaly or splenomegaly. No hernia. Lymphatic: no lymphadenopathy in the neck or groin Muscoloskeletal: no clubbing or cyanosis of the fingers.  Strength is symmetrical throughout.  Range of motion of bilateral upper and lower extremities normal without pain, crepitation or contracture. Neuro: cranial nerves grossly intact.  Sensation intact to light touch diffusely. Psych: appropriate mood and affect, normal insight/judgment intact  Skin: warm and dry   CBC Latest Ref Rng & Units 06/28/2020 06/27/2020 08/06/2019  WBC 4.0 - 10.5  K/uL 9.2 7.7 7.4  Hemoglobin 13.0 - 17.0 g/dL 14.5 14.3 14.1  Hematocrit 39 - 52 % 42.3 43.5 40.8  Platelets 150 - 400 K/uL 183 194 194.0    CMP Latest Ref Rng & Units 06/28/2020 06/27/2020 08/06/2019  Glucose 70 - 99 mg/dL 129(H) 179(H) 128(H)  BUN 8 - 23 mg/dL 13 12 16   Creatinine 0.61 - 1.24 mg/dL 1.08 1.09 1.00  Sodium 135 - 145 mmol/L 136 137 139  Potassium 3.5 - 5.1 mmol/L 4.1 4.3 4.7  Chloride 98 - 111 mmol/L 100 101 100  CO2 22 - 32 mmol/L 26 27 30   Calcium 8.9 - 10.3 mg/dL 8.8(L) 9.0 10.1  Total Protein 6.5 - 8.1 g/dL 6.7 6.6 7.3  Total Bilirubin 0.3 - 1.2 mg/dL 3.8(H) 2.1(H) 0.7  Alkaline Phos 38 - 126 U/L 122 91 60  AST 15 - 41 U/L 840(H) 206(H) 29  ALT 0 - 44 U/L 913(H) 144(H) 31    Lab Results  Component Value Date   INR 1.1 (H) 08/06/2019   INR 1.01 10/15/2010    Imaging: CT Abdomen Pelvis Wo Contrast  Result Date: 06/27/2020 CLINICAL DATA:  Abdominal pain. EXAM: CT ABDOMEN AND PELVIS WITHOUT CONTRAST TECHNIQUE: Multidetector CT imaging of the abdomen and pelvis was performed following the standard protocol without IV contrast. COMPARISON:  None. FINDINGS: Lower chest: The lung bases are clear. The heart size is normal. Hepatobiliary: There is decreased hepatic attenuation suggestive of hepatic steatosis. There is questionable mild gallbladder wall thickening and adjacent fat stranding.There is no biliary ductal dilation. Pancreas: Normal contours without ductal dilatation. No peripancreatic fluid collection. Spleen: Unremarkable. Adrenals/Urinary Tract: --Adrenal glands: Unremarkable. --Right kidney/ureter: No hydronephrosis or radiopaque kidney stones. --Left kidney/ureter: No hydronephrosis or radiopaque kidney stones. --Urinary bladder: Unremarkable. Stomach/Bowel: --Stomach/Duodenum: There is mild esophageal wall thickening. --Small bowel: Unremarkable. --Colon: Unremarkable. --Appendix: Normal. Vascular/Lymphatic: Atherosclerotic calcification is present within the  non-aneurysmal abdominal aorta, without hemodynamically significant stenosis. --No retroperitoneal lymphadenopathy. --No mesenteric lymphadenopathy. --No pelvic or inguinal lymphadenopathy. Reproductive: A penile prosthesis is noted. Other: No ascites or free air. The abdominal wall is normal. Musculoskeletal. There  is an acute appearing compression fracture of the T12 vertebral body with approximately 60% height loss centrally. There is no retropulsion. IMPRESSION: 1. There are mild inflammatory changes about the gallbladder. Findings could represent acute cholecystitis in the appropriate clinical setting, however this is discordant with the patient's recent ultrasound. If there is clinical suspicion for acute cholecystitis, follow-up with HIDA scan is recommended. 2. Acute appearing compression fracture of the T12 vertebral body with approximately 60% height loss centrally. There is no retropulsion. Correlation with point tenderness is recommended. 3. Hepatic steatosis. 4. Mild esophageal wall thickening. Correlate for symptoms of esophagitis. Aortic Atherosclerosis (ICD10-I70.0). Electronically Signed   By: Constance Holster M.D.   On: 06/27/2020 22:25   MR ABDOMEN MRCP WO CONTRAST  Result Date: 06/28/2020 CLINICAL DATA:  Gallstones.  Epigastric pain. EXAM: MRI ABDOMEN WITHOUT CONTRAST  (INCLUDING MRCP) TECHNIQUE: Multiplanar multisequence MR imaging of the abdomen was performed. Heavily T2-weighted images of the biliary and pancreatic ducts were obtained, and three-dimensional MRCP images were rendered by post processing. COMPARISON:  CT AP from 06/27/2020. FINDINGS: Lower chest: No acute findings. Hepatobiliary: Diffuse hepatic steatosis. Within the limitations of unenhanced technique there is no focal liver lesion identified. Gallbladder wall thickening is identified which measures up to 6 mm, image 16/7. No gallstone identified. Fusiform dilatation of the common bile duct measures up to 1 cm. Mild  central biliary prominence Focal narrowing of the distal CBD just before the ampulla is identified, image 9/15. No choledocholithiasis. Pancreas: No mass, inflammatory changes, or other parenchymal abnormality identified. Spleen:  Within normal limits in size and appearance. Adrenals/Urinary Tract: No masses identified. No evidence of hydronephrosis. Stomach/Bowel: Visualized portions within the abdomen are unremarkable. Vascular/Lymphatic:  No aneurysm.  No abdominal adenopathy. Other:  No free fluid or fluid collections. Musculoskeletal: No suspicious bone lesions identified. T12 compression fracture is identified with loss of up to 60% of the vertebral body height. Increased signal within the T12 vertebra identified compatible with edema. IMPRESSION: 1. Gallbladder wall thickening. No gallstones identified or pericholecystic fluid. Cannot exclude acute acalculous cholecystitis. 2. Fusiform dilatation of the common bile duct measures up to 1 cm. No signs of choledocholithiasis or obstructing mass. 3. T12 compression fracture with loss of up to 60% of the vertebral body height. Increased signal within the T12 vertebra compatible with edema compatible with acute to subacute fracture. 4. Diffuse hepatic steatosis. Electronically Signed   By: Kerby Moors M.D.   On: 06/28/2020 05:00   US Abdomen Limited RUQ  Result Date: 06/27/2020 CLINICAL DATA:  Epigastric pain for 1 day EXAM: ULTRASOUND ABDOMEN LIMITED RIGHT UPPER QUADRANT COMPARISON:  None. FINDINGS: Gallbladder: No gallstones or wall thickening visualized. No sonographic Murphy sign noted by sonographer. Common bile duct: Diameter: 6 mm Liver: Diffuse increased echotexture throughout the liver parenchyma consistent with hepatic steatosis. No focal liver parenchymal abnormality. Portal vein is patent on color Doppler imaging with normal direction of blood flow towards the liver. Other: None. IMPRESSION: 1. Hepatic steatosis. 2. Otherwise unremarkable exam.  Electronically Signed   By: Randa Ngo M.D.   On: 06/27/2020 19:14     A/P: 69 year old man with remote history of MI requiring stent who presented yesterday evening with a 1 day history of acute subxiphoid abdominal pain and nausea.  He has had an extensive work-up and has no gallstone disease identified, but some inflammation (thickening) of the gallbladder noted on both CT and MRI.  Acalculus cholecystitis would be extremely unlikely in this setting.  I ordered a  HIDA scan last night which is pending, although with the elevated LFTs this may not be as helpful.   -Given rising LFTs this morning, recommend GI consult for further work-up.    Surgery will continue to follow.   Patient Active Problem List   Diagnosis Date Noted  . Elevated LFTs 06/28/2020  . Abdominal pain 06/27/2020  . Osteopenia 04/19/2020  . Family history of colon cancer in 2 grandparents 10/28/2018  . NCGS (non-celiac gluten sensitivity) ? 10/28/2018  . 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 Hypothyroid 07/26/2018  . GERD (gastroesophageal reflux disease) 07/26/2018  . Barrett's esophagus 07/26/2018  . Rosacea 07/26/2018  . OSA (obstructive sleep apnea) 12/03/2016  . Depression 12/03/2016  . Hyperlipidemia 10/26/2010  . DEPRESSION/ANXIETY 10/26/2010  . GLAUCOMA 10/26/2010  . MYOCARDIAL INFARCTION, ACUTE, INFEROLATERAL 10/26/2010  . CAD, NATIVE VESSEL 10/26/2010  . SINUS BRADYCARDIA 10/26/2010       Romana Juniper, MD [facility]  See AMION to contact appropriate on-call provider

## 2020-06-28 NOTE — ED Notes (Signed)
Patient transported to MRI 

## 2020-06-28 NOTE — Consult Note (Addendum)
Consultation  Referring Provider: [facility]/ [place] Primary Care Physician:  [name], MD Primary Gastroenterologist:  Dr.Gessner  Reason for Consultation: Abdominal pain and elevated LFTs  HPI: Matthew Pratt is a 69 y.o. male ,, who presented to the emergency room yesterday with acute severe epigastric pain associated with nausea and vomiting, onset after he had eaten breakfast.  The pain was constant stabbing and sharp in nature and lasted for multiple hours.  This was associated with nausea and vomiting.  He had been feeling fine prior to acute onset.  No associated fever or chills, no diarrhea hematemesis, hematochezia or melena. Work-up in the emergency room revealed elevated LFTs with T bili 2.1/alk phos 91/AST 206/ALT 144.  CBC was normal lipase normal. He underwent initial abdominal ultrasound which did not show any gallstones, CBD was 6 mm and there is evidence of hepatic steatosis. CT of the abdomen pelvis was then done showing again hepatic steatosis, normal-appearing pancreas there was mild gallbladder wall thickening and adjacent fat stranding no ductal dilation mentioned.  He does have an acute T12 compression fracture. MRCP shows gallbladder wall thickening to 6 mm, no gallstones, CBD 1 cm with focal narrowing just before the ampulla and no evidence of choledocholithiasis. Surgery was consulted, felt possible, but unlikely a calculus cholecystitis.  HIDA scan was ordered and is pending. Labs today WBC 9.2, hemoglobin 14.5 MCV 102 T bili 3.8/alk phos 122/AST 840/ALT 913. Acute hepatitis serologies negative. Acetaminophen level less than 10 Initial troponin normal at 11, mildly elevated at 25 today  Patient is feeling better today says the acute pain has resolved and he is no longer nauseated. The only different medication he had been taking recently was a short course of prednisone.  He had also been taking about 3000 mg of acetaminophen for back pain.  Denies any EtOH  use. Other medical problems include coronary artery disease status post MI approximately 10 years ago with stent, hypothyroidism, GERD, hyperlipidemia, and history of colon polyps.   Past Medical History:  Diagnosis Date  . Anxiety   . Arthritis   . BPH (benign prostatic hypertrophy)   . CAD (coronary artery disease)    s/p STEMI 10/15/10 w/ Promus DES placed x1 in the first OM  . Colon polyps   . Depression   . Depression with anxiety   . Dupuytren's contracture of hand   . ED (erectile dysfunction)   . GERD (gastroesophageal reflux disease)   . Glaucoma   . HLD (hyperlipidemia)   . Hyperthyroidism   . Hypocontractile bladder   . MI (myocardial infarction) ([place])   . OSA on CPAP   . Peyronie's disease   . Rosacea   . Skin cancer    BCC    Past Surgical History:  Procedure Laterality Date  . COLONOSCOPY WITH ESOPHAGOGASTRODUODENOSCOPY (EGD)     multiple  . CORONARY STENT PLACEMENT    . INNER EAR SURGERY    . LASIK  02/1998  . PENILE PROSTHESIS IMPLANT    . TONSILECTOMY, ADENOIDECTOMY, BILATERAL MYRINGOTOMY AND TUBES    . TONSILLECTOMY    . VASECTOMY    . WISDOM TOOTH EXTRACTION      Prior to Admission medications   Medication Sig Start Date End Date Taking? Authorizing Provider  aspirin EC 81 MG tablet Take 81 mg by mouth daily.    Yes [provider]  latanoprost (XALATAN) 0.005 % ophthalmic solution Place 1 drop into both eyes at bedtime. 05/15/20  Yes [provider]  omeprazole (PRILOSEC) 10 MG capsule Take 1 capsule (10 mg total) by mouth daily. Patient taking differently: Take 10 mg by mouth every evening.  01/04/20  Yes Burchette, Alinda Sierras, MD  SYNTHROID 137 MCG tablet TAKE 1 TABLET EVERY DAY Patient taking differently: Take 137 mcg by mouth daily before breakfast.  06/24/20  Yes Burchette, Alinda Sierras, MD  timolol (TIMOPTIC) 0.5 % ophthalmic solution Place 1 drop into both eyes 2 (two) times daily.    Yes [provider]  venlafaxine XR  (EFFEXOR XR) 37.5 MG 24 hr capsule Take three capsules once daily Patient taking differently: Take 112.5 mg by mouth every evening.  11/13/19  Yes Burchette, Alinda Sierras, MD  cyclobenzaprine (FLEXERIL) 10 MG tablet Take 1 tablet (10 mg total) by mouth 3 (three) times daily as needed for muscle spasms. Patient not taking: Reported on 06/27/2020 05/30/20   Margarita Mail, PA-C  methylPREDNISolone (MEDROL DOSEPAK) 4 MG TBPK tablet Use as directed Patient not taking: Reported on 06/27/2020 05/30/20   Margarita Mail, PA-C    Current Facility-Administered Medications  Medication Dose Route Frequency Provider Last Rate Last Admin  . 0.9 %  sodium chloride infusion   Intravenous Continuous Rise Patience, MD 75 mL/hr at 06/28/20 0103 New Bag at 06/28/20 0103  . [START ON 07/01/2020] levothyroxine (SYNTHROID, LEVOTHROID) injection 68.5 mcg  68.5 mcg Intravenous Daily Rise Patience, MD      . morphine 2 MG/ML injection 2 mg  2 mg Intravenous Q4H PRN Rise Patience, MD   2 mg at 06/28/20 1037  . ondansetron (ZOFRAN) tablet 4 mg  4 mg Oral Q6H PRN Rise Patience, MD       Or  . ondansetron [facility]) injection 4 mg  4 mg Intravenous Q6H PRN Rise Patience, MD   4 mg at 06/28/20 1036  . piperacillin-tazobactam (ZOSYN) IVPB 3.375 g  3.375 g Intravenous Q8H Rise Patience, MD 12.5 mL/hr at 06/28/20 1047 3.375 g at 06/28/20 1047  . timolol (TIMOPTIC) 0.5 % ophthalmic solution 1 drop  1 drop Both Eyes BID Rise Patience, MD   1 drop at 06/28/20 1048   Current Outpatient Medications  Medication Sig Dispense Refill  . aspirin EC 81 MG tablet Take 81 mg by mouth daily.     Marland Kitchen latanoprost (XALATAN) 0.005 % ophthalmic solution Place 1 drop into both eyes at bedtime.    Marland Kitchen omeprazole (PRILOSEC) 10 MG capsule Take 1 capsule (10 mg total) by mouth daily. (Patient taking differently: Take 10 mg by mouth every evening. ) 90 capsule 3  . SYNTHROID 137 MCG tablet TAKE 1 TABLET EVERY DAY  (Patient taking differently: Take 137 mcg by mouth daily before breakfast. ) 90 tablet 1  . timolol (TIMOPTIC) 0.5 % ophthalmic solution Place 1 drop into both eyes 2 (two) times daily.     Marland Kitchen venlafaxine XR (EFFEXOR XR) 37.5 MG 24 hr capsule Take three capsules once daily (Patient taking differently: Take 112.5 mg by mouth every evening. ) 270 capsule 3  . cyclobenzaprine (FLEXERIL) 10 MG tablet Take 1 tablet (10 mg total) by mouth 3 (three) times daily as needed for muscle spasms. (Patient not taking: Reported on 06/27/2020) 21 tablet 0  . methylPREDNISolone (MEDROL DOSEPAK) 4 MG TBPK tablet Use as directed (Patient not taking: Reported on 06/27/2020) 21 tablet 0    Allergies as of 06/27/2020 - Review Complete 06/27/2020  Allergen Reaction Noted  . Crestor [rosuvastatin]  07/08/2017  .  Iodinated diagnostic agents Hives 07/31/2015  . Sulfa antibiotics  11/14/2014  . Sulfonamide derivatives      Family History  Problem Relation Age of Onset  . COPD Other   . COPD Mother   . Varicose Veins Father   . Liver cancer Father   . Basal cell carcinoma Father   . Basal cell carcinoma Sister   . Rheum arthritis Maternal Grandmother   . Heart failure Maternal Grandfather   . Colon cancer Paternal Grandmother   . Arthritis Paternal Grandfather   . Colon cancer Paternal Grandfather   . Melanoma Sister     Social History   Socioeconomic History  . Marital status: Divorced    Spouse name: Not on file  . Number of children: 2  . Years of education: Not on file  . Highest education level: Not on file  Occupational History  . Occupation: International aid/development worker    Comment: retired  Tobacco Use  . Smoking status: Never Smoker  . Smokeless tobacco: Never Used  Vaping Use  . Vaping Use: Never used  Substance and Sexual Activity  . Alcohol use: No  . Drug use: No  . Sexual activity: Not on file  Other Topics Concern  . Not on file  Social History Narrative   Divorced, 2 children    Retired Chief Financial Officer   No EtOH, tobacco, drugs   Social Determinants of Radio broadcast assistant Strain:   . Difficulty of Paying Living Expenses:   Food Insecurity:   . Worried About Charity fundraiser in the Last Year:   . Arboriculturist in the Last Year:   Transportation Needs:   . Film/video editor (Medical):   Marland Kitchen Lack of Transportation (Non-Medical):   Physical Activity:   . Days of Exercise per Week:   . Minutes of Exercise per Session:   Stress:   . Feeling of Stress :   Social Connections:   . Frequency of Communication with Friends and Family:   . Frequency of Social Gatherings with Friends and Family:   . Attends Religious Services:   . Active Member of Clubs or Organizations:   . Attends Archivist Meetings:   Marland Kitchen Marital Status:   Intimate Partner Violence:   . Fear of Current or Ex-Partner:   . Emotionally Abused:   Marland Kitchen Physically Abused:   . Sexually Abused:     Review of Systems: Pertinent positive and negative review of systems were noted in the above HPI section.  All other review of systems was otherwise negative.  Physical Exam: Vital signs in last 24 hours: Temp:  [98 F (36.7 C)-98.7 F (37.1 C)] 98.7 F (37.1 C) (07/23 1806) Pulse Rate:  [63-101] 90 (07/24 1016) Resp:  [12-22] 18 (07/24 1016) BP: (100-167)/(63-96) 143/85 (07/24 1016) SpO2:  [91 %-99 %] 94 % (07/24 1016) Weight:  [99.8 kg] 99.8 kg (07/23 1309)   General:   Alert,  Well-developed, well-nourished older white male, pleasant and cooperative in NAD Head:  Normocephalic and atraumatic. Eyes:  Sclera clear, no icterus.   Conjunctiva pink. Ears:  Normal auditory acuity. Nose:  No deformity, discharge,  or lesions. Mouth:  No deformity or lesions.   Neck:  Supple; no masses or thyromegaly. Lungs:  Clear throughout to auscultation.   No wheezes, crackles, or rhonchi. Heart:  Regular rate and rhythm; no murmurs, clicks, rubs,  or gallops. Abdomen:  Soft,nontender, BS  active,nonpalp mass or hsm.  Rectal:  Deferred  Msk:  Symmetrical without gross deformities. . Pulses:  Normal pulses noted. Extremities:  Without clubbing or edema. Neurologic:  Alert and  oriented x4;  grossly normal neurologically. Skin:  Intact without significant lesions or rashes.. Psych:  Alert and cooperative. Normal mood and affect.  Intake/Output from previous day: No intake/output data recorded. Intake/Output this shift: No intake/output data recorded.  Lab Results: Recent Labs    06/27/20 1313 06/28/20 0409  WBC 7.7 9.2  HGB 14.3 14.5  HCT 43.5 42.3  PLT 194 183   BMET Recent Labs    06/27/20 1313 06/28/20 0409  NA 137 136  K 4.3 4.1  CL 101 100  CO2 27 26  GLUCOSE 179* 129*  BUN 12 13  CREATININE 1.09 1.08  CALCIUM 9.0 8.8*   LFT Recent Labs    06/28/20 0409  PROT 6.7  ALBUMIN 3.7  AST 840*  ALT 913*  ALKPHOS 122  BILITOT 3.8*  BILIDIR 1.4*  IBILI 2.4*   PT/INR No results for input(s): LABPROT, INR in the last 72 hours. Hepatitis Panel Recent Labs    06/28/20 0802  HEPBSAG NON REACTIVE  HCVAB NON REACTIVE  HEPAIGM NON REACTIVE  HEPBIGM NON REACTIVE      IMPRESSION:  #49 69 year old white male presenting with acute severe epigastric pain nausea and vomiting onset yesterday.  Associated elevated LFTs, primarily transaminases with normal alk phos. Imaging studies with ultrasound, CT of the abdomen and MRCP, with finding of gallbladder wall thickening at 6 mm, no cholelithiasis, CBD of 1 cm without choledocholithiasis. Viral serologies negative  Etiology of symptoms not entirely clear though certainly sound biliary.    Other possibilities, other viral hepatopathy   #2 coronary artery disease status post remote MI 3.  Hypothyroidism 4.  GERD 5.  Acute T12 compression fracture and recent L3 fracture  Plan; start clear liquids, Continue Zosyn Trend LFTs, follow CBC, check INR We will add EBV and CMV serologies Await HIDA scan  which apparently may not be able to be done this weekend  Amy EsterwoodPA-C  06/28/2020, 1:07 PM   GI ATTENDING  History, laboratories, multiple x-ray studies personally reviewed.  Patient seen and examined in the emergency room.  Agree with comprehensive consultation note as outlined above.  Patient presents with acute abdominal pain after breakfast.  Found to have abnormal liver tests with normal alkaline phosphatase.  Multiple imaging studies do not show gallstones but rather some thickening of the gallbladder wall and inflammatory change without pericholecystic fluid.  Acute hepatitis serologies are negative.  He is on antibiotics and feeling better after pain medication.  The differential diagnosis includes acute acalculous cholecystitis (seen by surgery, HIDA pending) versus an acute hepatitis.  He is not toxic.  No evidence for vascular compromise on imaging studies.  Favor cholecystitis.  Agree with continuing antibiotics, HIDA scan, and ancillary studies as outlined.  We will follow.  Docia Chuck. Geri Seminole., M.D. [facility] Division of Gastroenterology

## 2020-06-29 DIAGNOSIS — R1013 Epigastric pain: Secondary | ICD-10-CM | POA: Diagnosis not present

## 2020-06-29 DIAGNOSIS — E039 Hypothyroidism, unspecified: Secondary | ICD-10-CM | POA: Diagnosis not present

## 2020-06-29 DIAGNOSIS — R7989 Other specified abnormal findings of blood chemistry: Secondary | ICD-10-CM | POA: Diagnosis not present

## 2020-06-29 LAB — CBC
HCT: 38.4 % — ABNORMAL LOW (ref 39.0–52.0)
Hemoglobin: 12.7 g/dL — ABNORMAL LOW (ref 13.0–17.0)
MCH: 34.3 pg — ABNORMAL HIGH (ref 26.0–34.0)
MCHC: 33.1 g/dL (ref 30.0–36.0)
MCV: 103.8 fL — ABNORMAL HIGH (ref 80.0–100.0)
Platelets: 152 10*3/uL (ref 150–400)
RBC: 3.7 MIL/uL — ABNORMAL LOW (ref 4.22–5.81)
RDW: 12.5 % (ref 11.5–15.5)
WBC: 3.9 10*3/uL — ABNORMAL LOW (ref 4.0–10.5)
nRBC: 0 % (ref 0.0–0.2)

## 2020-06-29 LAB — HEPATIC FUNCTION PANEL
ALT: 510 U/L — ABNORMAL HIGH (ref 0–44)
AST: 252 U/L — ABNORMAL HIGH (ref 15–41)
Albumin: 3.1 g/dL — ABNORMAL LOW (ref 3.5–5.0)
Alkaline Phosphatase: 93 U/L (ref 38–126)
Bilirubin, Direct: 0.5 mg/dL — ABNORMAL HIGH (ref 0.0–0.2)
Indirect Bilirubin: 1.3 mg/dL — ABNORMAL HIGH (ref 0.3–0.9)
Total Bilirubin: 1.8 mg/dL — ABNORMAL HIGH (ref 0.3–1.2)
Total Protein: 6 g/dL — ABNORMAL LOW (ref 6.5–8.1)

## 2020-06-29 LAB — EPSTEIN-BARR VIRUS VCA, IGM: EBV VCA IgM: <36 U/mL (ref 0.0–35.9)

## 2020-06-29 LAB — HEMOGLOBIN A1C
Hgb A1c MFr Bld: 5.6 % (ref 4.8–5.6)
Mean Plasma Glucose: 114.02 mg/dL

## 2020-06-29 MED ORDER — PANTOPRAZOLE SODIUM 40 MG PO TBEC
40.0000 mg | DELAYED_RELEASE_TABLET | Freq: Every day | ORAL | Status: DC
  Administered 2020-06-29 – 2020-07-01 (×3): 40 mg via ORAL
  Filled 2020-06-29 (×3): qty 1

## 2020-06-29 MED ORDER — ZOLPIDEM TARTRATE 5 MG PO TABS
5.0000 mg | ORAL_TABLET | Freq: Once | ORAL | Status: AC
  Administered 2020-06-29: 5 mg via ORAL
  Filled 2020-06-29: qty 1

## 2020-06-29 MED ORDER — TRAZODONE HCL 100 MG PO TABS
100.0000 mg | ORAL_TABLET | Freq: Every evening | ORAL | Status: DC | PRN
  Administered 2020-06-30: 100 mg via ORAL
  Filled 2020-06-29 (×2): qty 1

## 2020-06-29 NOTE — Progress Notes (Signed)
Bladder scan 685. In and out cath 650

## 2020-06-29 NOTE — Progress Notes (Signed)
[facility]  PROGRESS NOTE  Matthew Pratt HCW:[id number] DOB: [birth year] DOA: 06/27/2020 PCP: [name], MD   Brief HPI:   69 year old male with a history of CAD s/p stent placement, hypothyroidism came to ED with complaints of sudden onset abdominal pain since yesterday morning.  Patient had nausea vomiting but no diarrhea.  In the ED CT abdomen pelvis was concerning for cholecystitis.  MRCP abdomen showed hepatic steatosis and some gallbladder wall thickening.  Abdominal ultrasound also did not show acute cholecystitis.  General surgery was consulted, HIDA scan has been obtained and is currently pending. Patient has history of back pain and was recently prescribed Solu-Medrol Dosepak by ED provider 3 weeks ago.  He has not taken any other medications.  No previous history hepatitis, no alcohol abuse.  Patient has been taking Tylenol almost 3 g every day however Tylenol level was normal.   Subjective   Patient seen and examined, HIDA scan ordered by general surgery.  Currently pending.  HIDA scan on Monday.  Patient feels better today.  Still has mild epigastric pain.  Liver enzymes have significantly improved this morning.   Assessment/Plan:     1. Transaminitis-patient has significant hepatic steatosis seen on on MRI abdomen.?  NAFLD.  Liver enzymes are improving.  Patient told me that he took almost 3 g of Tylenol every day over the past 2 to 3 weeks for his back pain.  Tylenol level was normal.  Hepatitis panel is negative.  GI is following.   2. ?  Cholecystitis-seen on the CT scan abdomen and ultrasound, gallbladder wall thickening.  Alk phos is normal.  General surgery has ordered a HIDA scan which is currently pending.  Will follow HIDA scan results.  Patient does not have right upper quadrant pain or tenderness on palpation.  Though he feels better with IV Zosyn.  We will continue with IV antibiotics at this time. 3. ?  Diabetes mellitus-patient blood glucose this  morning was 129.  Will obtain hemoglobin A1c, lipid profile in a.m. 4. T12 compression fracture-patient has chronic back pain, will need MRI thoracic spine once patient was stable.  Continue pain management.  Will avoid Tylenol at this time. 5. Hypothyroidism-continue IV Synthroid    Scheduled medications:   . [START ON 07/01/2020] levothyroxine  68.5 mcg Intravenous Daily  . timolol  1 drop Both Eyes BID      CBC: Recent Labs  Lab 06/27/20 1313 06/28/20 0409 06/29/20 0852  WBC 7.7 9.2 3.9*  HGB 14.3 14.5 12.7*  HCT 43.5 42.3 38.4*  MCV 103.6* 102.4* 103.8*  PLT 194 183 176    Basic Metabolic Panel: Recent Labs  Lab 06/27/20 1313 06/28/20 0409  NA 137 136  K 4.3 4.1  CL 101 100  CO2 27 26  GLUCOSE 179* 129*  BUN 12 13  CREATININE 1.09 1.08  CALCIUM 9.0 8.8*     Liver Function Tests: Recent Labs  Lab 06/27/20 1313 06/28/20 0409 06/29/20 0852  AST 206* 840* 252*  ALT 144* 913* 510*  ALKPHOS 91 122 93  BILITOT 2.1* 3.8* 1.8*  PROT 6.6 6.7 6.0*  ALBUMIN 3.7 3.7 3.1*     Antibiotics: Anti-infectives (From admission, onward)   Start     Dose/Rate Route Frequency Ordered Stop   06/28/20 1000  piperacillin-tazobactam (ZOSYN) IVPB 3.375 g     Discontinue     3.375 g 12.5 mL/hr over 240 Minutes Intravenous Every 8 hours 06/28/20 0042     06/28/20 0045  piperacillin-tazobactam (ZOSYN) IVPB 3.375 g        3.375 g 100 mL/hr over 30 Minutes Intravenous  Once 06/28/20 0042 06/28/20 0151       DVT prophylaxis: SCDs  Code Status: Full code  Family Communication: No family at bedside    Status is: Inpatient  Dispo: The patient is from: Home              Anticipated d/c is to: Home              Anticipated d/c date is: 06/30/2020              Patient currently not medically stable for discharge  Barrier to discharge-ongoing work-up for transaminitis, rule out cholecystitis          Consultants:  Gastroenterology  General  surgery  Procedures:     Objective   Vitals:   06/29/20 0052 06/29/20 0456 06/29/20 1000 06/29/20 1413  BP: 118/69 118/74 (!) 96/54 (!) 105/62  Pulse: 58 55 50 50  Resp: _0 Temp: 98.7 F (37.1 C) 98.1 F (36.7 C) 98.3 F (36.8 C) 98.3 F (36.8 C)  TempSrc: Oral Oral Oral Oral  SpO2: 96% 97% 97% 96%  Weight:      Height:        Intake/Output Summary (Last 24 hours) at 06/29/2020 1418 Last data filed at 06/29/2020 1409 Gross per 24 hour  Intake 540 ml  Output 1700 ml  Net -1160 ml    07/23 1901 - 07/25 0700 In: 540 [P.O.:540] Out: 650 [Urine:650]  Filed Weights   06/27/20 1309  Weight: (!) 99.8 kg    Physical Examination:    General: Appears in no acute distress  Cardiovascular: S1-S2, regular  Respiratory: Clear to auscultation bilaterally  Abdomen: Mild epigastric tenderness to palpation  Extremities: No edema in the lower extremities  Neurologic: Cranial nerves II through XII grossly intact, no focal deficit noted    Data Reviewed:   Recent Results (from the past 240 hour(s))  SARS Coronavirus 2 by RT PCR (hospital order, performed in [place] hospital lab) Nasopharyngeal Nasopharyngeal Swab     Status: None   Collection Time: 06/27/20 11:29 PM   Specimen: Nasopharyngeal Swab  Result Value Ref Range Status   SARS Coronavirus 2 NEGATIVE NEGATIVE Final    Comment: (NOTE) SARS-CoV-2 target nucleic acids are NOT DETECTED.  The SARS-CoV-2 RNA is generally detectable in upper and lower respiratory specimens during the acute phase of infection. The lowest concentration of SARS-CoV-2 viral copies this assay can detect is 250 copies / mL. A negative result does not preclude SARS-CoV-2 infection and should not be used as the sole basis for treatment or other patient management decisions.  A negative result may occur with improper specimen collection / handling, submission of specimen other than nasopharyngeal swab, presence of viral  mutation(s) within the areas targeted by this assay, and inadequate number of viral copies (<250 copies / mL). A negative result must be combined with clinical observations, patient history, and epidemiological information.  Fact Sheet for Patients:   StrictlyIdeas.no  Fact Sheet for Healthcare Providers: BankingDealers.co.za  This test is not yet approved or  cleared by the [place] FDA and has been authorized for detection and/or diagnosis of SARS-CoV-2 by FDA under an Emergency Use Authorization (EUA).  This EUA will remain in effect (meaning this test can be used) for the duration of the COVID-19 declaration under Section 564(b)(1) of the Act, 21  U.S.C. section 360bbb-3(b)(1), unless the authorization is terminated or revoked sooner.  Performed at [facility], [place] [address] [place], [place] [address]     Recent Labs  Lab 06/27/20 1313  LIPASE 39   No results for input(s): AMMONIA in the last 168 hours.  Cardiac Enzymes: No results for input(s): CKTOTAL, CKMB, CKMBINDEX, TROPONINI in the last 168 hours. BNP (last 3 results) No results for input(s): BNP in the last 8760 hours.  ProBNP (last 3 results) No results for input(s): PROBNP in the last 8760 hours.  Studies:  CT Abdomen Pelvis Wo Contrast  Result Date: 06/27/2020 CLINICAL DATA:  Abdominal pain. EXAM: CT ABDOMEN AND PELVIS WITHOUT CONTRAST TECHNIQUE: Multidetector CT imaging of the abdomen and pelvis was performed following the standard protocol without IV contrast. COMPARISON:  None. FINDINGS: Lower chest: The lung bases are clear. The heart size is normal. Hepatobiliary: There is decreased hepatic attenuation suggestive of hepatic steatosis. There is questionable mild gallbladder wall thickening and adjacent fat stranding.There is no biliary ductal dilation. Pancreas: Normal contours without ductal dilatation. No peripancreatic fluid collection. Spleen:  Unremarkable. Adrenals/Urinary Tract: --Adrenal glands: Unremarkable. --Right kidney/ureter: No hydronephrosis or radiopaque kidney stones. --Left kidney/ureter: No hydronephrosis or radiopaque kidney stones. --Urinary bladder: Unremarkable. Stomach/Bowel: --Stomach/Duodenum: There is mild esophageal wall thickening. --Small bowel: Unremarkable. --Colon: Unremarkable. --Appendix: Normal. Vascular/Lymphatic: Atherosclerotic calcification is present within the non-aneurysmal abdominal aorta, without hemodynamically significant stenosis. --No retroperitoneal lymphadenopathy. --No mesenteric lymphadenopathy. --No pelvic or inguinal lymphadenopathy. Reproductive: A penile prosthesis is noted. Other: No ascites or free air. The abdominal wall is normal. Musculoskeletal. There is an acute appearing compression fracture of the T12 vertebral body with approximately 60% height loss centrally. There is no retropulsion. IMPRESSION: 1. There are mild inflammatory changes about the gallbladder. Findings could represent acute cholecystitis in the appropriate clinical setting, however this is discordant with the patient's recent ultrasound. If there is clinical suspicion for acute cholecystitis, follow-up with HIDA scan is recommended. 2. Acute appearing compression fracture of the T12 vertebral body with approximately 60% height loss centrally. There is no retropulsion. Correlation with point tenderness is recommended. 3. Hepatic steatosis. 4. Mild esophageal wall thickening. Correlate for symptoms of esophagitis. Aortic Atherosclerosis (ICD10-I70.0). Electronically Signed   By: Constance Holster M.D.   On: 06/27/2020 22:25   MR ABDOMEN MRCP WO CONTRAST  Result Date: 06/28/2020 CLINICAL DATA:  Gallstones.  Epigastric pain. EXAM: MRI ABDOMEN WITHOUT CONTRAST  (INCLUDING MRCP) TECHNIQUE: Multiplanar multisequence MR imaging of the abdomen was performed. Heavily T2-weighted images of the biliary and pancreatic ducts were  obtained, and three-dimensional MRCP images were rendered by post processing. COMPARISON:  CT AP from 06/27/2020. FINDINGS: Lower chest: No acute findings. Hepatobiliary: Diffuse hepatic steatosis. Within the limitations of unenhanced technique there is no focal liver lesion identified. Gallbladder wall thickening is identified which measures up to 6 mm, image 16/7. No gallstone identified. Fusiform dilatation of the common bile duct measures up to 1 cm. Mild central biliary prominence Focal narrowing of the distal CBD just before the ampulla is identified, image 9/15. No choledocholithiasis. Pancreas: No mass, inflammatory changes, or other parenchymal abnormality identified. Spleen:  Within normal limits in size and appearance. Adrenals/Urinary Tract: No masses identified. No evidence of hydronephrosis. Stomach/Bowel: Visualized portions within the abdomen are unremarkable. Vascular/Lymphatic:  No aneurysm.  No abdominal adenopathy. Other:  No free fluid or fluid collections. Musculoskeletal: No suspicious bone lesions identified. T12 compression fracture is identified with loss of up to 60% of the  vertebral body height. Increased signal within the T12 vertebra identified compatible with edema. IMPRESSION: 1. Gallbladder wall thickening. No gallstones identified or pericholecystic fluid. Cannot exclude acute acalculous cholecystitis. 2. Fusiform dilatation of the common bile duct measures up to 1 cm. No signs of choledocholithiasis or obstructing mass. 3. T12 compression fracture with loss of up to 60% of the vertebral body height. Increased signal within the T12 vertebra compatible with edema compatible with acute to subacute fracture. 4. Diffuse hepatic steatosis. Electronically Signed   By: Kerby Moors M.D.   On: 06/28/2020 05:00   US Abdomen Limited RUQ  Result Date: 06/27/2020 CLINICAL DATA:  Epigastric pain for 1 day EXAM: ULTRASOUND ABDOMEN LIMITED RIGHT UPPER QUADRANT COMPARISON:  None. FINDINGS:  Gallbladder: No gallstones or wall thickening visualized. No sonographic Murphy sign noted by sonographer. Common bile duct: Diameter: 6 mm Liver: Diffuse increased echotexture throughout the liver parenchyma consistent with hepatic steatosis. No focal liver parenchymal abnormality. Portal vein is patent on color Doppler imaging with normal direction of blood flow towards the liver. Other: None. IMPRESSION: 1. Hepatic steatosis. 2. Otherwise unremarkable exam. Electronically Signed   By: Randa Ngo M.D.   On: 06/27/2020 19:14       [place]   [facility] If 7PM-7AM, please contact night-coverage at [email or web address], Office  [phone]   06/29/2020, 2:18 PM  LOS: 2 days

## 2020-06-30 ENCOUNTER — Inpatient Hospital Stay (HOSPITAL_COMMUNITY): Payer: Medicare Other

## 2020-06-30 DIAGNOSIS — R7989 Other specified abnormal findings of blood chemistry: Secondary | ICD-10-CM

## 2020-06-30 DIAGNOSIS — G4733 Obstructive sleep apnea (adult) (pediatric): Secondary | ICD-10-CM

## 2020-06-30 DIAGNOSIS — E039 Hypothyroidism, unspecified: Secondary | ICD-10-CM | POA: Diagnosis not present

## 2020-06-30 DIAGNOSIS — R109 Unspecified abdominal pain: Secondary | ICD-10-CM

## 2020-06-30 LAB — COMPREHENSIVE METABOLIC PANEL
ALT: 447 U/L — ABNORMAL HIGH (ref 0–44)
AST: 172 U/L — ABNORMAL HIGH (ref 15–41)
Albumin: 3 g/dL — ABNORMAL LOW (ref 3.5–5.0)
Alkaline Phosphatase: 87 U/L (ref 38–126)
Anion gap: 8 (ref 5–15)
BUN: 8 mg/dL (ref 8–23)
CO2: 27 mmol/L (ref 22–32)
Calcium: 8.6 mg/dL — ABNORMAL LOW (ref 8.9–10.3)
Chloride: 105 mmol/L (ref 98–111)
Creatinine, Ser: 1.14 mg/dL (ref 0.61–1.24)
GFR calc Af Amer: >60 mL/min (ref >60)
GFR calc non Af Amer: >60 mL/min (ref >60)
Glucose, Bld: 105 mg/dL — ABNORMAL HIGH (ref 70–99)
Potassium: 4 mmol/L (ref 3.5–5.1)
Sodium: 140 mmol/L (ref 135–145)
Total Bilirubin: 1.1 mg/dL (ref 0.3–1.2)
Total Protein: 6 g/dL — ABNORMAL LOW (ref 6.5–8.1)

## 2020-06-30 LAB — LIPID PANEL
Cholesterol: 207 mg/dL — ABNORMAL HIGH (ref 0–200)
HDL: 44 mg/dL (ref >40)
LDL Cholesterol: 145 mg/dL — ABNORMAL HIGH (ref 0–99)
Total CHOL/HDL Ratio: 4.7 RATIO
Triglycerides: 89 mg/dL (ref <150)
VLDL: 18 mg/dL (ref 0–40)

## 2020-06-30 LAB — CBC
HCT: 37.3 % — ABNORMAL LOW (ref 39.0–52.0)
Hemoglobin: 12.4 g/dL — ABNORMAL LOW (ref 13.0–17.0)
MCH: 34.2 pg — ABNORMAL HIGH (ref 26.0–34.0)
MCHC: 33.2 g/dL (ref 30.0–36.0)
MCV: 102.8 fL — ABNORMAL HIGH (ref 80.0–100.0)
Platelets: 137 10*3/uL — ABNORMAL LOW (ref 150–400)
RBC: 3.63 MIL/uL — ABNORMAL LOW (ref 4.22–5.81)
RDW: 12.4 % (ref 11.5–15.5)
WBC: 3 10*3/uL — ABNORMAL LOW (ref 4.0–10.5)
nRBC: 0 % (ref 0.0–0.2)

## 2020-06-30 LAB — CMV IGM: CMV IgM: <30 AU/mL (ref 0.0–29.9)

## 2020-06-30 LAB — CMV ANTIBODY, IGG (EIA): CMV Ab - IgG: <0.6 U/mL (ref 0.00–0.59)

## 2020-06-30 MED ORDER — ZOLPIDEM TARTRATE 5 MG PO TABS
5.0000 mg | ORAL_TABLET | Freq: Every evening | ORAL | Status: DC | PRN
  Administered 2020-06-30: 5 mg via ORAL
  Filled 2020-06-30: qty 1

## 2020-06-30 MED ORDER — TECHNETIUM TC 99M MEBROFENIN IV KIT
5.4000 | PACK | Freq: Once | INTRAVENOUS | Status: AC | PRN
  Administered 2020-06-30: 5.4 via INTRAVENOUS

## 2020-06-30 NOTE — Progress Notes (Signed)
          Daily Rounding Note  06/30/2020, 10:40 AM  LOS: 3 days   SUBJECTIVE:   Chief complaint: nausea, vomiting abd pain   Going on 24 hours w/o abd pain.  Nausea w/o vomiting continues.  Overall feels much better.  No BM's for 3 d  OBJECTIVE:         Vital signs in last 24 hours:    Temp:  [98 F (36.7 C)-98.3 F (36.8 C)] 98.3 F (36.8 C) (07/26 0603) Pulse Rate:  [50-56] 50 (07/26 0026) Resp:  [16-17] 17 (07/26 0603) BP: (103-118)/(62-82) 118/71 (07/26 0603) SpO2:  [95 %-97 %] 97 % (07/26 0603) Last BM Date: 06/27/20 Filed Weights   06/27/20 1309  Weight: (!) 99.8 kg   General: Does not look ill.  Pleasant, comfortable. Heart: RRR. Chest: Clear bilaterally without labored breathing.  Abdomen: Soft, nontender, nondistended.  Bowel sounds hypoactive but normal quality.  No HSM or masses. Extremities: No CCE. Neuro/Psych: Pleasant, calm, cooperative, fluid speech.  Intake/Output from previous day: 07/25 0701 - 07/26 0700 In: -  Out: 2900 [Urine:2900]  Intake/Output this shift: Total I/O In: -  Out: 900 [Urine:900]  Lab Results: Recent Labs    06/28/20 0409 06/29/20 0852 06/30/20 0305  WBC 9.2 3.9* 3.0*  HGB 14.5 12.7* 12.4*  HCT 42.3 38.4* 37.3*  PLT 183 152 137*   BMET Recent Labs    06/27/20 1313 06/28/20 0409 06/30/20 0305  NA 137 136 140  K 4.3 4.1 4.0  CL 101 100 105  CO2 '27 26 27  '$ GLUCOSE 179* 129* 105*  BUN '12 13 8  '$ CREATININE 1.09 1.08 1.14  CALCIUM 9.0 8.8* 8.6*   LFT Recent Labs    06/28/20 0409 06/29/20 0852 06/30/20 0305  PROT 6.7 6.0* 6.0*  ALBUMIN 3.7 3.1* 3.0*  AST 840* 252* 172*  ALT 913* 510* 447*  ALKPHOS 122 93 87  BILITOT 3.8* 1.8* 1.1  BILIDIR 1.4* 0.5*  --   IBILI 2.4* 1.3*  --    PT/INR Recent Labs    06/28/20 1857  LABPROT 14.5  INR 1.2   Hepatitis Panel Recent Labs    06/28/20 0802  HEPBSAG NON REACTIVE  HCVAB NON REACTIVE  HEPAIGM NON REACTIVE   HEPBIGM NON REACTIVE    Studies/Results: No results found.   Scheduled Meds: . [START ON 07/01/2020] levothyroxine  68.5 mcg Intravenous Daily  . pantoprazole  40 mg Oral Daily  . timolol  1 drop Both Eyes BID   Continuous Infusions: . piperacillin-tazobactam (ZOSYN)  IV 3.375 g (06/30/20 0226)   PRN Meds:.acetaminophen, morphine injection, ondansetron **OR** ondansetron (ZOFRAN) IV, traZODone   ASSESMENT:   *   Elevated LFTs, abd pain, nonbloody nausea/vomiting.   Abdominal pain resolved, nausea continues. Imaging: CTAP, ultrasound: hepatic steatosis, GB wall thickening w adjacent fat stranding.  Biliary ducts normal.   MRCP w thick GB wall, 1 mm CBD, narrowing just proximal to ampulla but no choledocholithiasis.    HIDA: ordered Viral serologies negative.   Day 3 Zosyn.   t billi 3.8 >> 1.1 Alk phos: 122 >> 87 AST/ALT: 840/913 >> 172/447   PLAN   *  Awaiting HIDA scan completion, surgery following.      Azucena Freed  06/30/2020, 10:40 AM Phone [phone]

## 2020-06-30 NOTE — Progress Notes (Signed)
[facility]  PROGRESS NOTE  Ryot Burrous Dock YKD:[id number] DOB: [birth year] DOA: 06/27/2020 PCP: [name], MD   Brief HPI:   69 year old male with a history of CAD s/p stent placement, hypothyroidism came to ED with complaints of sudden onset abdominal pain since yesterday morning.  Patient had nausea vomiting but no diarrhea.  In the ED CT abdomen pelvis was concerning for cholecystitis.  MRCP abdomen showed hepatic steatosis and some gallbladder wall thickening.  Abdominal ultrasound also did not show acute cholecystitis.  General surgery was consulted, HIDA scan has been obtained and is currently pending. Patient has history of back pain and was recently prescribed Solu-Medrol Dosepak by ED provider 3 weeks ago.  He has not taken any other medications.  No previous history hepatitis, no alcohol abuse.  Patient has been taking Tylenol almost 3 g every day however Tylenol level was normal.   Subjective   Patient seen and examined, denies abdominal pain.  Awaiting HIDA scan today.  LFTs are improving.   Assessment/Plan:     1. Transaminitis-patient has significant hepatic steatosis seen on on MRI abdomen.?  NAFLD.  Liver enzymes are improving.  Patient told me that he took almost 3 g of Tylenol every day over the past 2 to 3 weeks for his back pain.  Tylenol level was normal.  Hepatitis panel is negative.  GI is following.   2. ?  Cholecystitis-seen on the CT scan abdomen and ultrasound, gallbladder wall thickening.  Alk phos is normal.  General surgery has ordered a HIDA scan which is to be done today.  Patient does not have right upper quadrant pain or tenderness on palpation.  Though he feels better with IV Zosyn.  We will continue with IV antibiotics at this time.  Will await HIDA scan results. 3. Hyperglycemia-hemoglobin A1c is 5.6. 4. T12 compression fracture-patient has chronic back pain, will need MRI thoracic spine once patient was stable.  Continue pain management.   Will avoid Tylenol at this time. 5. Hypothyroidism-continue IV Synthroid    Scheduled medications:   . [START ON 07/01/2020] levothyroxine  68.5 mcg Intravenous Daily  . pantoprazole  40 mg Oral Daily  . timolol  1 drop Both Eyes BID      CBC: Recent Labs  Lab 06/27/20 1313 06/28/20 0409 06/29/20 0852 06/30/20 0305  WBC 7.7 9.2 3.9* 3.0*  HGB 14.3 14.5 12.7* 12.4*  HCT 43.5 42.3 38.4* 37.3*  MCV 103.6* 102.4* 103.8* 102.8*  PLT 194 183 152 137*    Basic Metabolic Panel: Recent Labs  Lab 06/27/20 1313 06/28/20 0409 06/30/20 0305  NA 137 136 140  K 4.3 4.1 4.0  CL 101 100 105  CO2 '27 26 27  '$ GLUCOSE 179* 129* 105*  BUN '12 13 8  '$ CREATININE 1.09 1.08 1.14  CALCIUM 9.0 8.8* 8.6*     Liver Function Tests: Recent Labs  Lab 06/27/20 1313 06/28/20 0409 06/29/20 0852 06/30/20 0305  AST 206* 840* 252* 172*  ALT 144* 913* 510* 447*  ALKPHOS 91 122 93 87  BILITOT 2.1* 3.8* 1.8* 1.1  PROT 6.6 6.7 6.0* 6.0*  ALBUMIN 3.7 3.7 3.1* 3.0*     Antibiotics: Anti-infectives (From admission, onward)   Start     Dose/Rate Route Frequency Ordered Stop   06/28/20 1000  piperacillin-tazobactam (ZOSYN) IVPB 3.375 g     Discontinue     3.375 g 12.5 mL/hr over 240 Minutes Intravenous Every 8 hours 06/28/20 0042     06/28/20 0045  piperacillin-tazobactam (ZOSYN) IVPB 3.375 g        3.375 g 100 mL/hr over 30 Minutes Intravenous  Once 06/28/20 0042 06/28/20 0151       DVT prophylaxis: SCDs  Code Status: Full code  Family Communication: No family at bedside    Status is: Inpatient  Dispo: The patient is from: Home              Anticipated d/c is to: Home              Anticipated d/c date is: 06/30/2020              Patient currently not medically stable for discharge  Barrier to discharge-ongoing work-up for transaminitis, rule out cholecystitis          Consultants:  Gastroenterology  General surgery  Procedures:     Objective   Vitals:    06/29/20 1832 06/30/20 0026 06/30/20 0603 06/30/20 1214  BP: 115/82 103/68 118/71 (!) 112/64  Pulse: 56 50  49  Resp: '16 17 17 16  '$ Temp: 98.3 F (36.8 C) 98 F (36.7 C) 98.3 F (36.8 C) 98 F (36.7 C)  TempSrc: Oral Oral Oral Oral  SpO2:  95% 97% 98%  Weight:      Height:        Intake/Output Summary (Last 24 hours) at 06/30/2020 1343 Last data filed at 06/30/2020 1033 Gross per 24 hour  Intake --  Output 3400 ml  Net -3400 ml    07/24 1901 - 07/26 0700 In: 60 [P.O.:60] Out: 3550 [Urine:3550]  Filed Weights   06/27/20 1309  Weight: (!) 99.8 kg    Physical Examination:    General-appears in no acute distress  Heart-S1-S2, regular, no murmur auscultated  Lungs-clear to auscultation bilaterally, no wheezing or crackles auscultated  Abdomen-soft, nontender, no organomegaly  Extremities-no edema in the lower extremities  Neuro-alert, oriented x3, no focal deficit noted    Data Reviewed:   Recent Results (from the past 240 hour(s))  SARS Coronavirus 2 by RT PCR (hospital order, performed in [place] hospital lab) Nasopharyngeal Nasopharyngeal Swab     Status: None   Collection Time: 06/27/20 11:29 PM   Specimen: Nasopharyngeal Swab  Result Value Ref Range Status   SARS Coronavirus 2 NEGATIVE NEGATIVE Final    Comment: (NOTE) SARS-CoV-2 target nucleic acids are NOT DETECTED.  The SARS-CoV-2 RNA is generally detectable in upper and lower respiratory specimens during the acute phase of infection. The lowest concentration of SARS-CoV-2 viral copies this assay can detect is 250 copies / mL. A negative result does not preclude SARS-CoV-2 infection and should not be used as the sole basis for treatment or other patient management decisions.  A negative result may occur with improper specimen collection / handling, submission of specimen other than nasopharyngeal swab, presence of viral mutation(s) within the areas targeted by this assay, and inadequate number  of viral copies (<250 copies / mL). A negative result must be combined with clinical observations, patient history, and epidemiological information.  Fact Sheet for Patients:   StrictlyIdeas.no  Fact Sheet for Healthcare Providers: BankingDealers.co.za  This test is not yet approved or  cleared by the [place] FDA and has been authorized for detection and/or diagnosis of SARS-CoV-2 by FDA under an Emergency Use Authorization (EUA).  This EUA will remain in effect (meaning this test can be used) for the duration of the COVID-19 declaration under Section 564(b)(1) of the Act, 21 U.S.C. section 360bbb-3(b)(1), unless the  authorization is terminated or revoked sooner.  Performed at [facility], [place] [address], [place], [place] [address]     Recent Labs  Lab 06/27/20 1313  LIPASE 54        [facility] If 7PM-7AM, please contact night-coverage at [email or web address], Office  [phone]   06/30/2020, 1:43 PM  LOS: 3 days

## 2020-06-30 NOTE — Progress Notes (Signed)
[facility] Progress Note     Subjective: CC:  Denies pain, still having some nausea requiring anti-emetics.   Objective: Vital signs in last 24 hours: Temp:  [98 F (36.7 C)-98.3 F (36.8 C)] 98.3 F (36.8 C) (07/26 0603) Pulse Rate:  [50-56] 50 (07/26 0026) Resp:  [16-17] 17 (07/26 0603) BP: (96-118)/(54-82) 118/71 (07/26 0603) SpO2:  [95 %-97 %] 97 % (07/26 0603) Last BM Date: 06/27/20  Intake/Output from previous day: 07/25 0701 - 07/26 0700 In: -  Out: 2900 [Urine:2900] Intake/Output this shift: No intake/output data recorded.  PE: Gen:  Alert, NAD, pleasant Card:  Regular rate and rhythm, pedal pulses 2+ BL Pulm:  Normal effort, clear to auscultation bilaterally Abd: Soft, non-tender, mild distention, no guarding.  Skin: warm and dry, no rashes  Psych: A&Ox3     Lab Results:  Recent Labs    06/29/20 0852 06/30/20 0305  WBC 3.9* 3.0*  HGB 12.7* 12.4*  HCT 38.4* 37.3*  PLT 152 137*   BMET Recent Labs    06/28/20 0409 06/30/20 0305  NA 136 140  K 4.1 4.0  CL 100 105  CO2 26 27  GLUCOSE 129* 105*  BUN 13 8  CREATININE 1.08 1.14  CALCIUM 8.8* 8.6*   PT/INR Recent Labs    06/28/20 1857  LABPROT 14.5  INR 1.2   CMP     Component Value Date/Time   NA 140 06/30/2020 0305   K 4.0 06/30/2020 0305   CL 105 06/30/2020 0305   CO2 27 06/30/2020 0305   GLUCOSE 105 (H) 06/30/2020 0305   BUN 8 06/30/2020 0305   CREATININE 1.14 06/30/2020 0305   CALCIUM 8.6 (L) 06/30/2020 0305   PROT 6.0 (L) 06/30/2020 0305   ALBUMIN 3.0 (L) 06/30/2020 0305   AST 172 (H) 06/30/2020 0305   ALT 447 (H) 06/30/2020 0305   ALKPHOS 87 06/30/2020 0305   BILITOT 1.1 06/30/2020 0305   GFRNONAA >60 06/30/2020 0305   GFRAA >60 06/30/2020 0305   Lipase     Component Value Date/Time   LIPASE 39 06/27/2020 1313       Studies/Results: No results found.  Anti-infectives: Anti-infectives (From admission, onward)   Start     Dose/Rate Route Frequency  Ordered Stop   06/28/20 1000  piperacillin-tazobactam (ZOSYN) IVPB 3.375 g     Discontinue     3.375 g 12.5 mL/hr over 240 Minutes Intravenous Every 8 hours 06/28/20 0042     06/28/20 0045  piperacillin-tazobactam (ZOSYN) IVPB 3.375 g        3.375 g 100 mL/hr over 30 Minutes Intravenous  Once 06/28/20 0042 06/28/20 0151       Assessment/Plan CAD with PMH MI  Chronic back pain   Acute Abdominal pain associated with nausea/emesis Elevated LFTs, hyperbilirubinemia  - afebrile, VSS, WBC 3 - AST 172, ALT 447, bilirubin 1.1 (downtrending) - RUQ U/S, CT of the abdomen and MRCP show gallbladder wall thickening at 6 mm, no cholelithiasis, CBD of 1 cm without choledocholithiasis. - hepatitis panel negative  - HIDA pending for possible acalculous cholecystitis, we will follow results and make recommendations accordingly    FEN: NPO, IVF ID: Zosyn VTE: SCD's   LOS: 3 days    Obie Dredge, [facility] Please see Amion for pager number during day hours 7:00am-4:30pm

## 2020-06-30 NOTE — Progress Notes (Signed)
Patient ID: Matthew Pratt, male   DOB: 1951-11-15, 69 y.o.   MRN: [id number]   HIDA scan is normal without evidence of cholecystitis and has a normal ejection fraction. I discussed with the patient. Still uncertain what has caused his elevated LFT's and symptoms. Holding on cholecystectomy for now Will still follow

## 2020-07-01 DIAGNOSIS — E039 Hypothyroidism, unspecified: Secondary | ICD-10-CM | POA: Diagnosis not present

## 2020-07-01 DIAGNOSIS — R1013 Epigastric pain: Secondary | ICD-10-CM | POA: Diagnosis not present

## 2020-07-01 LAB — CBC
HCT: 38.6 % — ABNORMAL LOW (ref 39.0–52.0)
Hemoglobin: 13 g/dL (ref 13.0–17.0)
MCH: 34.4 pg — ABNORMAL HIGH (ref 26.0–34.0)
MCHC: 33.7 g/dL (ref 30.0–36.0)
MCV: 102.1 fL — ABNORMAL HIGH (ref 80.0–100.0)
Platelets: 167 10*3/uL (ref 150–400)
RBC: 3.78 MIL/uL — ABNORMAL LOW (ref 4.22–5.81)
RDW: 12.3 % (ref 11.5–15.5)
WBC: 3.4 10*3/uL — ABNORMAL LOW (ref 4.0–10.5)
nRBC: 0 % (ref 0.0–0.2)

## 2020-07-01 LAB — COMPREHENSIVE METABOLIC PANEL
ALT: 308 U/L — ABNORMAL HIGH (ref 0–44)
AST: 92 U/L — ABNORMAL HIGH (ref 15–41)
Albumin: 3 g/dL — ABNORMAL LOW (ref 3.5–5.0)
Alkaline Phosphatase: 85 U/L (ref 38–126)
Anion gap: 7 (ref 5–15)
BUN: 7 mg/dL — ABNORMAL LOW (ref 8–23)
CO2: 29 mmol/L (ref 22–32)
Calcium: 8.9 mg/dL (ref 8.9–10.3)
Chloride: 105 mmol/L (ref 98–111)
Creatinine, Ser: 1.06 mg/dL (ref 0.61–1.24)
GFR calc Af Amer: >60 mL/min (ref >60)
GFR calc non Af Amer: >60 mL/min (ref >60)
Glucose, Bld: 112 mg/dL — ABNORMAL HIGH (ref 70–99)
Potassium: 4.1 mmol/L (ref 3.5–5.1)
Sodium: 141 mmol/L (ref 135–145)
Total Bilirubin: 1.1 mg/dL (ref 0.3–1.2)
Total Protein: 6.3 g/dL — ABNORMAL LOW (ref 6.5–8.1)

## 2020-07-01 MED ORDER — OXYCODONE HCL 5 MG PO TABS: 5.0000 mg | ORAL_TABLET | Freq: Three times a day (TID) | ORAL | 0 refills | Status: AC | PRN

## 2020-07-01 MED ORDER — VENLAFAXINE HCL ER 75 MG PO CP24
112.5000 mg | ORAL_CAPSULE | Freq: Every morning | ORAL | Status: DC
  Administered 2020-07-01: 112.5 mg via ORAL
  Filled 2020-07-01: qty 1

## 2020-07-01 MED ORDER — VENLAFAXINE HCL ER 75 MG PO CP24
112.5000 mg | ORAL_CAPSULE | Freq: Every evening | ORAL | Status: DC
  Filled 2020-07-01: qty 1

## 2020-07-01 MED ORDER — LEVOTHYROXINE SODIUM 25 MCG PO TABS
137.0000 ug | ORAL_TABLET | Freq: Every day | ORAL | Status: DC
  Administered 2020-07-01: 137 ug via ORAL
  Filled 2020-07-01: qty 1

## 2020-07-01 NOTE — Care Management Important Message (Signed)
Important Message  Patient Details  Name: Matthew Pratt MRN: [id number] Date of Birth: 1951/05/13   Medicare Important Message Given:  Yes     [name] 07/01/2020, 3:49 PM

## 2020-07-01 NOTE — Progress Notes (Signed)
[facility] Progress Note     Subjective: CC:  Mild nausea this AM with CLD. Overall feels better- denies abdominal pain, fever, chills, vomiting. Having flatus and "rumbling" but denies BM since admission. Family member at the bedside this AM.  Objective: Vital signs in last 24 hours: Temp:  [97.8 F (36.6 C)-98.1 F (36.7 C)] 98 F (36.7 C) (07/27 1610) Pulse Rate:  [49-59] 59 (07/27 0638) Resp:  [16-18] 18 (07/27 9604) BP: (99-112)/(64-74) 105/67 (07/27 5409) SpO2:  [96 %-100 %] 96 % (07/27 8119) Last BM Date: 06/27/20  Intake/Output from previous day: 07/26 0701 - 07/27 0700 In: -  Out: 2000 [Urine:2000] Intake/Output this shift: No intake/output data recorded.  PE: Gen:  Alert, NAD, pleasant Card:  Regular rate and rhythm, pedal pulses 2+ BL Pulm:  Normal effort, clear to auscultation bilaterally Abd: Soft, non-tender, mild distention, no guarding.  Skin: warm and dry, no rashes  Psych: A&Ox3     Lab Results:  Recent Labs    06/30/20 0305 07/01/20 0419  WBC 3.0* 3.4*  HGB 12.4* 13.0  HCT 37.3* 38.6*  PLT 137* 167   BMET Recent Labs    06/30/20 0305 07/01/20 0419  NA 140 141  K 4.0 4.1  CL 105 105  CO2 27 29  GLUCOSE 105* 112*  BUN 8 7*  CREATININE 1.14 1.06  CALCIUM 8.6* 8.9   PT/INR Recent Labs    06/28/20 1857  LABPROT 14.5  INR 1.2   CMP     Component Value Date/Time   NA 141 07/01/2020 0419   K 4.1 07/01/2020 0419   CL 105 07/01/2020 0419   CO2 29 07/01/2020 0419   GLUCOSE 112 (H) 07/01/2020 0419   BUN 7 (L) 07/01/2020 0419   CREATININE 1.06 07/01/2020 0419   CALCIUM 8.9 07/01/2020 0419   PROT 6.3 (L) 07/01/2020 0419   ALBUMIN 3.0 (L) 07/01/2020 0419   AST 92 (H) 07/01/2020 0419   ALT 308 (H) 07/01/2020 0419   ALKPHOS 85 07/01/2020 0419   BILITOT 1.1 07/01/2020 0419   GFRNONAA >60 07/01/2020 0419   GFRAA >60 07/01/2020 0419   Lipase     Component Value Date/Time   LIPASE 39 06/27/2020 1313        Studies/Results: NM Hepato W/EF  Result Date: 06/30/2020 CLINICAL DATA:  Cholecystitis EXAM: NUCLEAR MEDICINE HEPATOBILIARY IMAGING WITH GALLBLADDER EF TECHNIQUE: Sequential images of the abdomen were obtained out to 60 minutes following intravenous administration of radiopharmaceutical. After oral ingestion of Ensure, gallbladder ejection fraction was determined. At 60 min, normal ejection fraction is greater than 33%. RADIOPHARMACEUTICALS:  5.4 mCi Tc-25m  Choletec IV COMPARISON:  Ultrasound and CT 06/27/2020.  MRCP 06/28/2020 FINDINGS: Prompt uptake and biliary excretion of activity by the liver is seen. Gallbladder activity is visualized, consistent with patency of cystic duct. Biliary activity passes into small bowel, consistent with patent common bile duct. Calculated gallbladder ejection fraction is 52%. (Normal gallbladder ejection fraction with Ensure is greater than 33%.) IMPRESSION: No evidence of cystic duct or common bile duct obstruction. Normal gallbladder ejection fraction. Electronically Signed   By: Rolm Baptise M.D.   On: 06/30/2020 16:18    Anti-infectives: Anti-infectives (From admission, onward)   Start     Dose/Rate Route Frequency Ordered Stop   06/28/20 1000  piperacillin-tazobactam (ZOSYN) IVPB 3.375 g     Discontinue     3.375 g 12.5 mL/hr over 240 Minutes Intravenous Every 8 hours 06/28/20 0042     06/28/20  0045  piperacillin-tazobactam (ZOSYN) IVPB 3.375 g        3.375 g 100 mL/hr over 30 Minutes Intravenous  Once 06/28/20 0042 06/28/20 0151       Assessment/Plan CAD with PMH MI  Chronic back pain   Acute Abdominal pain associated with nausea/emesis Elevated LFTs, hyperbilirubinemia  - afebrile, VSS, WBC 3 - AST 92, ALT 308, bilirubin 1.1 (downtrending) - RUQ U/S, CT of the abdomen and MRCP show gallbladder wall thickening at 6 mm, no cholelithiasis, CBD of 1 cm without choledocholithiasis - hepatitis panel negative  - HIDA negative for acalculous  cholecystitis - GI planning outpatient MRCP in follow up for CBD dilation  - HSV pending   FEN: CLD >> advance to low fat a monitor sxs  ID: Zosyn 7/23 >> ok to D/C abx from [facility] perspective  VTE: SCD's  Plan: recommend D/C abx and advance diet and see how patient responds. He is clinically improving, afebrile, no leukocytosis, and LFTs down-trending. If he can tolerate low fat diet without recurrence of sxs then agree he could go home with outpatient GI follow up. If sxs recur then he may need further workup.    LOS: 4 days   Obie Dredge, [facility] Please see Amion for pager number during day hours 7:00am-4:30pm

## 2020-07-01 NOTE — Progress Notes (Signed)
Pharmacy Antibiotic Note  Matthew Pratt is a 69 y.o. male admitted on 06/27/2020 with intra abdominal infection.  Pharmacy has been consulted for zosyn dosing.   Pt is on D4 of zosyn to r/o intra-abdominal infection. Work-up has remained neg for now for infection. Dr Darrick Meigs will see the patient today before dc abx.   Scr 1.06 Wbc 3.4 AF  Plan: Zosyn 3.375g IV q8h (4 hour infusion).  F/u dc abx  Height: 6\' 1"  (185.4 cm) Weight: (!) 99.8 kg (220 lb) IBW/kg (Calculated) : 79.9  Temp (24hrs), Avg:98 F (36.7 C), Min:97.8 F (36.6 C), Max:98.1 F (36.7 C)  Recent Labs  Lab 06/27/20 1313 06/28/20 0409 06/29/20 0852 06/30/20 0305 07/01/20 0419  WBC 7.7 9.2 3.9* 3.0* 3.4*  CREATININE 1.09 1.08  --  1.14 1.06    Estimated Creatinine Clearance: 79.5 mL/min (by C-G formula based on SCr of 1.06 mg/dL).    Allergies  Allergen Reactions  . Crestor [Rosuvastatin]     Myalgia. Myalgia.   . Iodinated Diagnostic Agents Hives    Makes PT itchy  . Sulfa Antibiotics     REACTION: rash  . Sulfonamide Derivatives     REACTION: rash   Onnie Boer, PharmD, [place], AAHIVP, CPP Infectious Disease Pharmacist 07/01/2020 9:00 AM

## 2020-07-01 NOTE — Progress Notes (Signed)
PHARMACIST - PHYSICIAN COMMUNICATION  DR:   Darrick Meigs  CONCERNING: IV to Oral Route Change Policy  RECOMMENDATION: This patient is receiving Synthroid by the intravenous route.  Based on criteria approved by the Pharmacy and Therapeutics Committee, the intravenous medication(s) is/are being converted to the equivalent oral dose form(s).   DESCRIPTION: These criteria include:  The patient is eating (either orally or via tube) and/or has been taking other orally administered medications for a least 24 hours  The patient has no evidence of active gastrointestinal bleeding or impaired GI absorption (gastrectomy, short bowel, patient on TNA or NPO).  If you have questions about this conversion, please contact the Pharmacy Department  []   [phone] )  [facility] []   [phone] )  [facility] [x]   [phone] )  [facility] []   [phone] )  [facility] []   [phone] )  [place], [place] 07/01/2020 8:51 AM

## 2020-07-01 NOTE — Discharge Summary (Addendum)
Physician Discharge Summary  Matthew Pratt VOZ:[id number] DOB: 23-Sep-1951 DOA: 06/27/2020  PCP: Matthew Post, MD  Admit date: 06/27/2020 Discharge date: 07/01/2020  Time spent: 50 minutes  Recommendations for Outpatient Follow-up:  1. Follow-up gastroenterology as outpatient 2. Follow-up PCP in 1 weeks 3. Will need neurosurgical referral as outpatient for acute/subacute T12 compression fracture   Discharge Diagnoses:  Principal Problem:   Abdominal pain Active Problems:   CAD, NATIVE VESSEL   OSA (obstructive sleep apnea)   Hypothyroid   Elevated LFTs   Discharge Condition: Stable  Diet recommendation: Regular diet  Filed Weights   06/27/20 1309  Weight: (!) 99.8 kg    History of present illness:  69 year old male with a history of CAD s/p stent placement, hypothyroidism came to ED with complaints of sudden onset abdominal pain since yesterday morning. Patient had nausea vomiting but no diarrhea. In the ED CT abdomen pelvis was concerning for cholecystitis. MRCP abdomen showed hepatic steatosis and some gallbladder wall thickening. Abdominal ultrasound also did not show acute cholecystitis. General surgery was consulted, HIDA scan has been obtained and is currently pending. Patient has history of back pain and was recently prescribed Solu-Medrol Dosepak by ED provider 3 weeks ago. He has not taken any other medications. No previous history hepatitis, no alcohol abuse. Patient has been taking Tylenol almost 3 g every day however Tylenol level was normal.  Hospital Course:  1. Transaminitis-patient has significant hepatic steatosis seen on on MRI abdomen.?  NAFLD.  Liver enzymes are improving.  Patient told me that he took almost 3 g of Tylenol every day over the past 2 to 3 weeks for his back pain.  Tylenol level was normal.  Hepatitis panel is negative.    MRCP abdomen showed dilatation of common bile duct up to 1 cm, however no choledocholithiasis.  Likely passed  stone.  GI will follow as outpatient for repeat MRCP abdomen. 2. ?  Cholecystitis-seen on the CT scan abdomen and ultrasound, gallbladder wall thickening.  Alk phos is normal.    General surgery was consulted, HIDA scan was ordered which came back normal.  Patient was started on IV Zosyn empirically which has been discontinued.  No plans for cholecystectomy as per general surgery.   3. Hyperglycemia-hemoglobin A1c is 5.6. 4. T12 compression fracture-CT abdomen pelvis and MRCP abdomen confirms acute to subacute T12 compression fracture.  Patient denies any pain at T12 level however he has L3 vertebral disease and takes pain medications for that.  He will need neurosurgical evaluation as outpatient.  Patient wants to discuss with his PCP before getting neurosurgical evaluation. 5. Hypothyroidism-continue Synthroid 6. Chronic back pain-patient was taking Tylenol 3 g/day, I advised patient not to take that much Tylenol as he has hepatic steatosis.  Will start oxycodone 5 mg every 8 hours as needed for pain.  Procedures:    Consultations:    Discharge Exam: Vitals:   07/01/20 0638 07/01/20 1141  BP: 105/67 (!) 140/83  Pulse: 59 67  Resp: 18 18  Temp: 98 F (36.7 C) 98.7 F (37.1 C)  SpO2: 96% 97%    General: Appears in no acute distress Cardiovascular: S1-S2, regular Respiratory: Clear to auscultation bilaterally  Discharge Instructions   Discharge Instructions    Diet - low sodium heart healthy   Complete by: As directed    Increase activity slowly   Complete by: As directed      Allergies as of 07/01/2020      Reactions   Crestor [  rosuvastatin]    Myalgia. Myalgia.   Iodinated Diagnostic Agents Hives   Makes PT itchy   Sulfa Antibiotics    REACTION: rash   Sulfonamide Derivatives    REACTION: rash      Medication List    STOP taking these medications   methylPREDNISolone 4 MG Tbpk tablet Commonly known as: MEDROL DOSEPAK     TAKE these medications   aspirin  EC 81 MG tablet Take 81 mg by mouth daily.   cyclobenzaprine 10 MG tablet Commonly known as: FLEXERIL Take 1 tablet (10 mg total) by mouth 3 (three) times daily as needed for muscle spasms.   latanoprost 0.005 % ophthalmic solution Commonly known as: XALATAN Place 1 drop into both eyes at bedtime.   omeprazole 10 MG capsule Commonly known as: PRILOSEC Take 1 capsule (10 mg total) by mouth daily. What changed: when to take this   oxyCODONE 5 MG immediate release tablet Commonly known as: Roxicodone Take 1 tablet (5 mg total) by mouth every 8 (eight) hours as needed for up to 5 days for severe pain.   Synthroid 137 MCG tablet Generic drug: levothyroxine TAKE 1 TABLET EVERY DAY What changed:   how much to take  when to take this   timolol 0.5 % ophthalmic solution Commonly known as: TIMOPTIC Place 1 drop into both eyes 2 (two) times daily.   venlafaxine XR 37.5 MG 24 hr capsule Commonly known as: Effexor XR Take three capsules once daily What changed:   how much to take  how to take this  when to take this  additional instructions      Allergies  Allergen Reactions  . Crestor [Rosuvastatin]     Myalgia. Myalgia.   . Iodinated Diagnostic Agents Hives    Makes PT itchy  . Sulfa Antibiotics     REACTION: rash  . Sulfonamide Derivatives     REACTION: rash    Follow-up Information    Alfredia Ferguson, PA-C Follow up on 07/28/2020.   Specialty: Gastroenterology Why: 2 PM follow up with Dr Celesta Aver PA regarding elevated liver tests and dilated bile duct.   Contact information: [address] [place] [address] [phone]        Matthew Post, MD Follow up in 2 week(s).   Specialty: Family Medicine Contact information: [place] [address] [phone]                The results of significant diagnostics from this hospitalization (including imaging, microbiology, ancillary and laboratory) are listed below  for reference.    Significant Diagnostic Studies: CT Abdomen Pelvis Wo Contrast  Result Date: 06/27/2020 CLINICAL DATA:  Abdominal pain. EXAM: CT ABDOMEN AND PELVIS WITHOUT CONTRAST TECHNIQUE: Multidetector CT imaging of the abdomen and pelvis was performed following the standard protocol without IV contrast. COMPARISON:  None. FINDINGS: Lower chest: The lung bases are clear. The heart size is normal. Hepatobiliary: There is decreased hepatic attenuation suggestive of hepatic steatosis. There is questionable mild gallbladder wall thickening and adjacent fat stranding.There is no biliary ductal dilation. Pancreas: Normal contours without ductal dilatation. No peripancreatic fluid collection. Spleen: Unremarkable. Adrenals/Urinary Tract: --Adrenal glands: Unremarkable. --Right kidney/ureter: No hydronephrosis or radiopaque kidney stones. --Left kidney/ureter: No hydronephrosis or radiopaque kidney stones. --Urinary bladder: Unremarkable. Stomach/Bowel: --Stomach/Duodenum: There is mild esophageal wall thickening. --Small bowel: Unremarkable. --Colon: Unremarkable. --Appendix: Normal. Vascular/Lymphatic: Atherosclerotic calcification is present within the non-aneurysmal abdominal aorta, without hemodynamically significant stenosis. --No retroperitoneal lymphadenopathy. --No mesenteric lymphadenopathy. --  No pelvic or inguinal lymphadenopathy. Reproductive: A penile prosthesis is noted. Other: No ascites or free air. The abdominal wall is normal. Musculoskeletal. There is an acute appearing compression fracture of the T12 vertebral body with approximately 60% height loss centrally. There is no retropulsion. IMPRESSION: 1. There are mild inflammatory changes about the gallbladder. Findings could represent acute cholecystitis in the appropriate clinical setting, however this is discordant with the patient's recent ultrasound. If there is clinical suspicion for acute cholecystitis, follow-up with HIDA scan is  recommended. 2. Acute appearing compression fracture of the T12 vertebral body with approximately 60% height loss centrally. There is no retropulsion. Correlation with point tenderness is recommended. 3. Hepatic steatosis. 4. Mild esophageal wall thickening. Correlate for symptoms of esophagitis. Aortic Atherosclerosis (ICD10-I70.0). Electronically Signed   By: Constance Holster M.D.   On: 06/27/2020 22:25   MR ABDOMEN MRCP WO CONTRAST  Result Date: 06/28/2020 CLINICAL DATA:  Gallstones.  Epigastric pain. EXAM: MRI ABDOMEN WITHOUT CONTRAST  (INCLUDING MRCP) TECHNIQUE: Multiplanar multisequence MR imaging of the abdomen was performed. Heavily T2-weighted images of the biliary and pancreatic ducts were obtained, and three-dimensional MRCP images were rendered by Pratt processing. COMPARISON:  CT AP from 06/27/2020. FINDINGS: Lower chest: No acute findings. Hepatobiliary: Diffuse hepatic steatosis. Within the limitations of unenhanced technique there is no focal liver lesion identified. Gallbladder wall thickening is identified which measures up to 6 mm, image 16/7. No gallstone identified. Fusiform dilatation of the common bile duct measures up to 1 cm. Mild central biliary prominence Focal narrowing of the distal CBD just before the ampulla is identified, image 9/15. No choledocholithiasis. Pancreas: No mass, inflammatory changes, or other parenchymal abnormality identified. Spleen:  Within normal limits in size and appearance. Adrenals/Urinary Tract: No masses identified. No evidence of hydronephrosis. Stomach/Bowel: Visualized portions within the abdomen are unremarkable. Vascular/Lymphatic:  No aneurysm.  No abdominal adenopathy. Other:  No free fluid or fluid collections. Musculoskeletal: No suspicious bone lesions identified. T12 compression fracture is identified with loss of up to 60% of the vertebral body height. Increased signal within the T12 vertebra identified compatible with edema. IMPRESSION: 1.  Gallbladder wall thickening. No gallstones identified or pericholecystic fluid. Cannot exclude acute acalculous cholecystitis. 2. Fusiform dilatation of the common bile duct measures up to 1 cm. No signs of choledocholithiasis or obstructing mass. 3. T12 compression fracture with loss of up to 60% of the vertebral body height. Increased signal within the T12 vertebra compatible with edema compatible with acute to subacute fracture. 4. Diffuse hepatic steatosis. Electronically Signed   By: Kerby Moors M.D.   On: 06/28/2020 05:00   NM Hepato W/EF  Result Date: 06/30/2020 CLINICAL DATA:  Cholecystitis EXAM: NUCLEAR MEDICINE HEPATOBILIARY IMAGING WITH GALLBLADDER EF TECHNIQUE: Sequential images of the abdomen were obtained out to 60 minutes following intravenous administration of radiopharmaceutical. After oral ingestion of Ensure, gallbladder ejection fraction was determined. At 60 min, normal ejection fraction is greater than 33%. RADIOPHARMACEUTICALS:  5.4 mCi Tc-27m Choletec IV COMPARISON:  Ultrasound and CT 06/27/2020.  MRCP 06/28/2020 FINDINGS: Prompt uptake and biliary excretion of activity by the liver is seen. Gallbladder activity is visualized, consistent with patency of cystic duct. Biliary activity passes into small bowel, consistent with patent common bile duct. Calculated gallbladder ejection fraction is 52%. (Normal gallbladder ejection fraction with Ensure is greater than 33%.) IMPRESSION: No evidence of cystic duct or common bile duct obstruction. Normal gallbladder ejection fraction. Electronically Signed   By: KRolm BaptiseM.D.   On:  06/30/2020 16:18   US Abdomen Limited RUQ  Result Date: 06/27/2020 CLINICAL DATA:  Epigastric pain for 1 day EXAM: ULTRASOUND ABDOMEN LIMITED RIGHT UPPER QUADRANT COMPARISON:  None. FINDINGS: Gallbladder: No gallstones or wall thickening visualized. No sonographic Murphy sign noted by sonographer. Common bile duct: Diameter: 6 mm Liver: Diffuse increased  echotexture throughout the liver parenchyma consistent with hepatic steatosis. No focal liver parenchymal abnormality. Portal vein is patent on color Doppler imaging with normal direction of blood flow towards the liver. Other: None. IMPRESSION: 1. Hepatic steatosis. 2. Otherwise unremarkable exam. Electronically Signed   By: Randa Ngo M.D.   On: 06/27/2020 19:14    Microbiology: Recent Results (from the past 240 hour(s))  SARS Coronavirus 2 by RT PCR (hospital order, performed in [facility] hospital lab) Nasopharyngeal Nasopharyngeal Swab     Status: None   Collection Time: 06/27/20 11:29 PM   Specimen: Nasopharyngeal Swab  Result Value Ref Range Status   SARS Coronavirus 2 NEGATIVE NEGATIVE Final    Comment: (NOTE) SARS-CoV-2 target nucleic acids are NOT DETECTED.  The SARS-CoV-2 RNA is generally detectable in upper and lower respiratory specimens during the acute phase of infection. The lowest concentration of SARS-CoV-2 viral copies this assay can detect is 250 copies / mL. A negative result does not preclude SARS-CoV-2 infection and should not be used as the sole basis for treatment or other patient management decisions.  A negative result may occur with improper specimen collection / handling, submission of specimen other than nasopharyngeal swab, presence of viral mutation(s) within the areas targeted by this assay, and inadequate number of viral copies (<250 copies / mL). A negative result must be combined with clinical observations, patient history, and epidemiological information.  Fact Sheet for Patients:   StrictlyIdeas.no  Fact Sheet for Healthcare Providers: BankingDealers.co.za  This test is not yet approved or  cleared by the [place] FDA and has been authorized for detection and/or diagnosis of SARS-CoV-2 by FDA under an Emergency Use Authorization (EUA).  This EUA will remain in effect (meaning this test can  be used) for the duration of the COVID-19 declaration under Section 564(b)(1) of the Act, 21 U.S.C. section 360bbb-3(b)(1), unless the authorization is terminated or revoked sooner.  Performed at [facility], [place] [address] [place], [place] [address]      Labs: Basic Metabolic Panel: Recent Labs  Lab 06/27/20 1313 06/28/20 0409 06/30/20 0305 07/01/20 0419  NA 137 136 140 141  K 4.3 4.1 4.0 4.1  CL 101 100 105 105  CO2 _0 GLUCOSE 179* 129* 105* 112*  BUN _1 7*  CREATININE 1.09 1.08 1.14 1.06  CALCIUM 9.0 8.8* 8.6* 8.9   Liver Function Tests: Recent Labs  Lab 06/27/20 1313 06/28/20 0409 06/29/20 0852 06/30/20 0305 07/01/20 0419  AST 206* 840* 252* 172* 92*  ALT 144* 913* 510* 447* 308*  ALKPHOS 91 122 93 87 85  BILITOT 2.1* 3.8* 1.8* 1.1 1.1  PROT 6.6 6.7 6.0* 6.0* 6.3*  ALBUMIN 3.7 3.7 3.1* 3.0* 3.0*   Recent Labs  Lab 06/27/20 1313  LIPASE 39   No results for input(s): AMMONIA in the last 168 hours. CBC: Recent Labs  Lab 06/27/20 1313 06/28/20 0409 06/29/20 0852 06/30/20 0305 07/01/20 0419  WBC 7.7 9.2 3.9* 3.0* 3.4*  HGB 14.3 14.5 12.7* 12.4* 13.0  HCT 43.5 42.3 38.4* 37.3* 38.6*  MCV 103.6* 102.4* 103.8* 102.8* 102.1*  PLT 194 183 152 137* 167  Signed:  Oswald Hillock MD.  [facility] 07/01/2020, 2:17 PM

## 2020-07-01 NOTE — Progress Notes (Addendum)
Daily Rounding Note  07/01/2020, 10:20 AM  LOS: 4 days   SUBJECTIVE:   Chief complaint: elevated LFTs    abd pain still resolved.  Nausea much better but still int queasiness.  No BM's >48 hours but no solid food for several days.    OBJECTIVE:         Vital signs in last 24 hours:    Temp:  [97.8 F (36.6 C)-98.1 F (36.7 C)] 98 F (36.7 C) (07/27 0254) Pulse Rate:  [49-59] 59 (07/27 0638) Resp:  [16-18] 18 (07/27 2706) BP: (99-112)/(64-74) 105/67 (07/27 2376) SpO2:  [96 %-100 %] 96 % (07/27 0638) Last BM Date: 06/27/20 Filed Weights   06/27/20 1309  Weight: (!) 99.8 kg   General: looks well.  comfortable   Heart: RRR Chest: clear bil Abdomen: soft, NT, active BS, ND  Extremities: no CCE Neuro/Psych:  Oriented x 3.  No weakness or deficits.    Intake/Output from previous day: 07/26 0701 - 07/27 0700 In: -  Out: 2000 [Urine:2000]  Intake/Output this shift: No intake/output data recorded.  Lab Results: Recent Labs    06/29/20 0852 06/30/20 0305 07/01/20 0419  WBC 3.9* 3.0* 3.4*  HGB 12.7* 12.4* 13.0  HCT 38.4* 37.3* 38.6*  PLT 152 137* 167   BMET Recent Labs    06/30/20 0305 07/01/20 0419  NA 140 141  K 4.0 4.1  CL 105 105  CO2 27 29  GLUCOSE 105* 112*  BUN 8 7*  CREATININE 1.14 1.06  CALCIUM 8.6* 8.9   LFT Recent Labs    06/29/20 0852 06/30/20 0305 07/01/20 0419  PROT 6.0* 6.0* 6.3*  ALBUMIN 3.1* 3.0* 3.0*  AST 252* 172* 92*  ALT 510* 447* 308*  ALKPHOS 93 87 85  BILITOT 1.8* 1.1 1.1  BILIDIR 0.5*  --   --   IBILI 1.3*  --   --    PT/INR Recent Labs    06/28/20 1857  LABPROT 14.5  INR 1.2   Hepatitis Panel No results for input(s): HEPBSAG, HCVAB, HEPAIGM, HEPBIGM in the last 72 hours.  Studies/Results: NM Hepato W/EF  Result Date: 06/30/2020 CLINICAL DATA:  Cholecystitis EXAM: NUCLEAR MEDICINE HEPATOBILIARY IMAGING WITH GALLBLADDER EF TECHNIQUE: Sequential images  of the abdomen were obtained out to 60 minutes following intravenous administration of radiopharmaceutical. After oral ingestion of Ensure, gallbladder ejection fraction was determined. At 60 min, normal ejection fraction is greater than 33%. RADIOPHARMACEUTICALS:  5.4 mCi Tc-39m Choletec IV COMPARISON:  Ultrasound and CT 06/27/2020.  MRCP 06/28/2020 FINDINGS: Prompt uptake and biliary excretion of activity by the liver is seen. Gallbladder activity is visualized, consistent with patency of cystic duct. Biliary activity passes into small bowel, consistent with patent common bile duct. Calculated gallbladder ejection fraction is 52%. (Normal gallbladder ejection fraction with Ensure is greater than 33%.) IMPRESSION: No evidence of cystic duct or common bile duct obstruction. Normal gallbladder ejection fraction. Electronically Signed   By: KRolm BaptiseM.D.   On: 06/30/2020 16:18    ASSESMENT:   *   Elevated LFTs, abd pain, nonbloody n/v.   Abdominal pain resolved, nausea continues. Imaging: CTAP, ultrasound: hepatic steatosis, GB wall thickening w adjacent fat stranding.  Biliary ducts normal.   MRCP w thick GB wall, 1 mm CBD, narrowing just proximal to ampulla but no choledocholithiasis.    HIDA: no cystic or CBD obstruction, normal GB EF.   Acute hepatitis panel negative.   CMV IgM  negative.  EBV serologies negative.   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   Was using 3 gm tylenol daily for several months for MS pain mgt though APAP level not elevated.    Day 4 Zosyn.   t billi 3.8 >> 1.1 Alk phos: 122 >> 85 AST/ALT: 840/913 >> 92/308 Lipase 39.   ? Passed CBD stone.    Surgery has no plans for lap chole.     PLAN   *  Follow up MRCP as outpt. To re-asess dilation of CBD, if persistent then outpt EUS.    Carlean Purl is MD.  Has 8/23/121 appt with PA [place].    *   w below normal WBCs and no fever, resolution of GI sxs, will stop the Zosyn.    *   Agree w surgeons plans to advance to low fat diet.  If  tolerated, home today w GI fup in a few weeks.    Azucena Freed  07/01/2020, 10:20 AM Phone [phone]

## 2020-07-02 ENCOUNTER — Telehealth: Payer: Self-pay | Admitting: Family Medicine

## 2020-07-02 LAB — HSV(HERPES SIMPLEX VRS) I + II AB-IGM: HSVI/II Comb IgM: 1.23 Ratio — ABNORMAL HIGH (ref 0.00–0.90)

## 2020-07-02 NOTE — Telephone Encounter (Signed)
Transition Care Management Follow-up Telephone Call  Date of discharge and from where: 07/01/2020 from [facility]   How have you been since you were released from the hospital? Patient states feels weak and has some pain still and also has been feeling a little stressed since leaving the hospital.  Any questions or concerns? Yes  has concerns of prescription of oxycodone that was called in from the hospital   Items Reviewed:  Did the pt receive and understand the discharge instructions provided? Yes   Medications obtained and verified? Yes   Any new allergies since your discharge? No   Dietary orders reviewed? Yes  Do you have support at home? Yes has ex wife has been helping him  Functional Questionnaire: (I = Independent and D = Dependent) ADLs: I  Bathing/Dressing- I  Meal Prep- I  Eating- I  Maintaining continence- I  Transferring/Ambulation- I  Managing Meds- I  Follow up appointments reviewed:   PCP Hospital f/u appt confirmed? Yes  Scheduled to see Dr. Elease Hashimoto on 07/04/2020 @ 10:45 am.  [facility] f/u appt confirmed? Yes  Scheduled to see Dr.Esterwood on 07/28/20 @ 2:00 PM.  Are transportation arrangements needed? No   If their condition worsens, is the pt aware to call PCP or go to the Emergency Dept.? Yes  Was the patient provided with contact information for the PCP's office or ED? Yes  Was to pt encouraged to call back with questions or concerns? Yes

## 2020-07-03 ENCOUNTER — Other Ambulatory Visit: Payer: Self-pay

## 2020-07-03 DIAGNOSIS — R945 Abnormal results of liver function studies: Secondary | ICD-10-CM

## 2020-07-04 ENCOUNTER — Ambulatory Visit (INDEPENDENT_AMBULATORY_CARE_PROVIDER_SITE_OTHER): Payer: Medicare Other | Admitting: Family Medicine

## 2020-07-04 ENCOUNTER — Other Ambulatory Visit: Payer: Self-pay

## 2020-07-04 VITALS — BP 116/62 | HR 54 | Temp 98.1°F | Wt 223.7 lb

## 2020-07-04 DIAGNOSIS — M8580 Other specified disorders of bone density and structure, unspecified site: Secondary | ICD-10-CM | POA: Diagnosis not present

## 2020-07-04 DIAGNOSIS — K760 Fatty (change of) liver, not elsewhere classified: Secondary | ICD-10-CM

## 2020-07-04 DIAGNOSIS — R7989 Other specified abnormal findings of blood chemistry: Secondary | ICD-10-CM | POA: Diagnosis not present

## 2020-07-04 DIAGNOSIS — S22080A Wedge compression fracture of T11-T12 vertebra, initial encounter for closed fracture: Secondary | ICD-10-CM | POA: Insufficient documentation

## 2020-07-04 DIAGNOSIS — R109 Unspecified abdominal pain: Secondary | ICD-10-CM

## 2020-07-04 NOTE — Progress Notes (Signed)
Established Patient Office Visit  Subjective:  Patient ID: Matthew Pratt, male    DOB: 1951-04-03  Age: 69 y.o. MRN: [id number]  CC:  Chief Complaint  Patient presents with  . Hospitalization Follow-up    abdominal pain has resolved    HPI Matthew Pratt presents for hospital follow-up.  He was admitted on 23 July.  He woke up that morning and had a typical breakfast of bran flakes and some fruit.  He developed fairly acute onset afterwards of epigastric abdominal pain associated with nausea and vomiting but no diarrhea.  He presented to the ED for further evaluation.  CT abdomen pelvis was concerning for possible acute cholecystitis.  MRCP showed fatty liver changes and some gallbladder wall thickening.  Abdominal ultrasound did not show any acute cholecystitis.  General surgery consulted.  HIDA scan came back negative.  His lipase was negative.  No history of alcohol use.  No history of hepatitis.  He had significantly elevated liver transaminases which did improve through the hospitalization.  There is history that he had been taking up to 3 g of Tylenol every day for the past few weeks for low back pain.  Hepatitis panel negative.  MRCP showed 1 cm dilation of common bile duct but no choledocholithiasis.  It was suspected that he had passed a stone.  There were no recommendations for cholecystectomy.  He did have incidental note of T12 compression fracture on CT and MRCP.  No pain at that level.  He has had some pain around L3.  He had recent DEXA scan which showed osteopenia and this was back in May.  He is taking some calcium and vitamin D.  No history of any recent injury or trauma.  He has hypothyroidism is on replacement.  Apparently several tests were done to evaluate his liver enzymes and these included herpes simplex virus 1 and 2 antibodies with slightly positive IgM.  He states he has never had diagnosis of herpes nor has he ever had any typical skin lesions either orally  or generally and has not been sexually active in over 8 years.  He does report recent shingles vaccine.  Past Medical History:  Diagnosis Date  . Anxiety   . Arthritis   . BPH (benign prostatic hypertrophy)   . CAD (coronary artery disease)    s/p STEMI 10/15/10 w/ Promus DES placed x1 in the first OM  . Colon polyps   . Depression   . Depression with anxiety   . Dupuytren's contracture of hand   . ED (erectile dysfunction)   . GERD (gastroesophageal reflux disease)   . Glaucoma   . HLD (hyperlipidemia)   . Hyperthyroidism   . Hypocontractile bladder   . MI (myocardial infarction) ([place])   . OSA on CPAP   . Peyronie's disease   . Rosacea   . Skin cancer    BCC    Past Surgical History:  Procedure Laterality Date  . COLONOSCOPY WITH ESOPHAGOGASTRODUODENOSCOPY (EGD)     multiple  . CORONARY STENT PLACEMENT    . INNER EAR SURGERY    . LASIK  02/1998  . PENILE PROSTHESIS IMPLANT    . TONSILECTOMY, ADENOIDECTOMY, BILATERAL MYRINGOTOMY AND TUBES    . TONSILLECTOMY    . VASECTOMY    . WISDOM TOOTH EXTRACTION      Family History  Problem Relation Age of Onset  . COPD Other   . COPD Mother   . Varicose Veins Father   .  Liver cancer Father   . Basal cell carcinoma Father   . Basal cell carcinoma Sister   . Rheum arthritis Maternal Grandmother   . Heart failure Maternal Grandfather   . Colon cancer Paternal Grandmother   . Arthritis Paternal Grandfather   . Colon cancer Paternal Grandfather   . Melanoma Sister     Social History   Socioeconomic History  . Marital status: Divorced    Spouse name: Not on file  . Number of children: 2  . Years of education: Not on file  . Highest education level: Not on file  Occupational History  . Occupation: International aid/development worker    Comment: retired  Tobacco Use  . Smoking status: Never Smoker  . Smokeless tobacco: Never Used  Vaping Use  . Vaping Use: Never used  Substance and Sexual Activity  . Alcohol use:  No  . Drug use: No  . Sexual activity: Not on file  Other Topics Concern  . Not on file  Social History Narrative   Divorced, 2 children   Retired Chief Financial Officer   No EtOH, tobacco, drugs   Social Determinants of Radio broadcast assistant Strain:   . Difficulty of Paying Living Expenses:   Food Insecurity:   . Worried About Charity fundraiser in the Last Year:   . Arboriculturist in the Last Year:   Transportation Needs:   . Film/video editor (Medical):   Matthew Kitchen Lack of Transportation (Non-Medical):   Physical Activity:   . Days of Exercise per Week:   . Minutes of Exercise per Session:   Stress:   . Feeling of Stress :   Social Connections:   . Frequency of Communication with Friends and Family:   . Frequency of Social Gatherings with Friends and Family:   . Attends Religious Services:   . Active Member of Clubs or Organizations:   . Attends Archivist Meetings:   Matthew Kitchen Marital Status:   Intimate Partner Violence:   . Fear of Current or Ex-Partner:   . Emotionally Abused:   Matthew Kitchen Physically Abused:   . Sexually Abused:     Outpatient Medications Prior to Visit  Medication Sig Dispense Refill  . aspirin EC 81 MG tablet Take 81 mg by mouth daily.     . cyclobenzaprine (FLEXERIL) 10 MG tablet Take 1 tablet (10 mg total) by mouth 3 (three) times daily as needed for muscle spasms. (Patient not taking: Reported on 06/27/2020) 21 tablet 0  . latanoprost (XALATAN) 0.005 % ophthalmic solution Place 1 drop into both eyes at bedtime.    Matthew Kitchen omeprazole (PRILOSEC) 10 MG capsule Take 1 capsule (10 mg total) by mouth daily. (Patient not taking: Reported on 07/04/2020) 90 capsule 3  . oxyCODONE (ROXICODONE) 5 MG immediate release tablet Take 1 tablet (5 mg total) by mouth every 8 (eight) hours as needed for up to 5 days for severe pain. 15 tablet 0  . SYNTHROID 137 MCG tablet TAKE 1 TABLET EVERY DAY (Patient taking differently: Take 137 mcg by mouth daily before breakfast. ) 90 tablet 1  .  timolol (TIMOPTIC) 0.5 % ophthalmic solution Place 1 drop into both eyes 2 (two) times daily.     Matthew Kitchen venlafaxine XR (EFFEXOR XR) 37.5 MG 24 hr capsule Take three capsules once daily (Patient taking differently: Take 112.5 mg by mouth every evening. ) 270 capsule 3   No facility-administered medications prior to visit.    Allergies  Allergen  Reactions  . Crestor [Rosuvastatin]     Myalgia. Myalgia.   . Iodinated Diagnostic Agents Hives    Makes PT itchy  . Sulfa Antibiotics     REACTION: rash  . Sulfonamide Derivatives     REACTION: rash    ROS Review of Systems  Constitutional: Negative for chills and fever.  Respiratory: Negative for cough and shortness of breath.   Gastrointestinal: Negative for abdominal pain.  Genitourinary: Negative for dysuria.  Skin: Negative for rash.      Objective:    Physical Exam Vitals reviewed.  Constitutional:      Appearance: Normal appearance.  Cardiovascular:     Rate and Rhythm: Normal rate and regular rhythm.  Pulmonary:     Effort: Pulmonary effort is normal.     Breath sounds: Normal breath sounds.  Abdominal:     Palpations: Abdomen is soft. There is no mass.     Tenderness: There is no abdominal tenderness. There is no guarding or rebound.     Hernia: No hernia is present.  Musculoskeletal:     Right lower leg: No edema.     Left lower leg: No edema.  Skin:    Findings: No rash.  Neurological:     Mental Status: He is alert.     BP (!) 116/62 (BP Location: Left Arm, Patient Position: Sitting, Cuff Size: Large)   Pulse 54   Temp 98.1 F (36.7 C) (Oral)   Wt (!) 223 lb 11.2 oz (101.5 kg)   SpO2 98%   BMI 29.51 kg/m  Wt Readings from Last 3 Encounters:  07/04/20 (!) 223 lb 11.2 oz (101.5 kg)  06/27/20 (!) 220 lb (99.8 kg)  04/18/20 221 lb 3.2 oz (100.3 kg)     There are no preventive care reminders to display for this patient.  There are no preventive care reminders to display for this patient.  Lab Results   Component Value Date   TSH 1.55 08/28/2019   Lab Results  Component Value Date   WBC 3.4 (L) 07/01/2020   HGB 13.0 07/01/2020   HCT 38.6 (L) 07/01/2020   MCV 102.1 (H) 07/01/2020   PLT 167 07/01/2020   Lab Results  Component Value Date   NA 141 07/01/2020   K 4.1 07/01/2020   CO2 29 07/01/2020   GLUCOSE 112 (H) 07/01/2020   BUN 7 (L) 07/01/2020   CREATININE 1.06 07/01/2020   BILITOT 1.1 07/01/2020   ALKPHOS 85 07/01/2020   AST 92 (H) 07/01/2020   ALT 308 (H) 07/01/2020   PROT 6.3 (L) 07/01/2020   ALBUMIN 3.0 (L) 07/01/2020   CALCIUM 8.9 07/01/2020   ANIONGAP 7 07/01/2020   GFR 73.77 08/06/2019   Lab Results  Component Value Date   CHOL 207 (H) 06/30/2020   Lab Results  Component Value Date   HDL 44 06/30/2020   Lab Results  Component Value Date   LDLCALC 145 (H) 06/30/2020   Lab Results  Component Value Date   TRIG 89 06/30/2020   Lab Results  Component Value Date   CHOLHDL 4.7 06/30/2020   Lab Results  Component Value Date   HGBA1C 5.6 06/29/2020      Assessment & Plan:   #1 recent abdominal pain with liver transaminase elevations.  Suspect related to gallbladder stone which passed.  He did have some common bile duct dilatation but his lipase was normal and liver transaminases trending downward.  He has not had any other recurrent symptoms such as recurrent  abdominal pain or fever and appetite is good at this time  -He has pending follow-up with GI in August and plans to get repeat liver panel then -Follow-up immediately for any recurrent abdominal pain, fever, or other concerns  #2 fatty liver changes noted recently on imaging  -We discussed significance of this and also recommendation to try to lose some weight.  He does not use alcohol regularly  #3 T12 compression fracture which is basically asymptomatic.  Recent DEXA scan showed osteopenia.  -We discussed the importance of regular weightbearing exercise along with continue vitamin D and  adequate calcium predominately dietary  #4+ HSV antibodies.  We discussed fairly high possibility of false positives especially in a low risk asymptomatic individual.  Patient raised issue of whether this could be related to recent shingles vaccine  No orders of the defined types were placed in this encounter.   Follow-up: Return in about 2 weeks (around 07/18/2020).    [name], MD

## 2020-07-04 NOTE — Patient Instructions (Signed)
Follow up promptly for any recurrent abdominal pain, nausea, or fever

## 2020-07-06 ENCOUNTER — Telehealth: Payer: Medicare Other | Admitting: Family

## 2020-07-06 DIAGNOSIS — H1033 Unspecified acute conjunctivitis, bilateral: Secondary | ICD-10-CM | POA: Diagnosis not present

## 2020-07-06 MED ORDER — CETIRIZINE HCL 10 MG PO TABS: 10.0000 mg | ORAL_TABLET | Freq: Every day | ORAL | 11 refills | Status: DC

## 2020-07-06 MED ORDER — POLYMYXIN B-TRIMETHOPRIM 10000-0.1 UNIT/ML-% OP SOLN: 1.0000 [drp] | Freq: Four times a day (QID) | OPHTHALMIC | 0 refills | Status: DC

## 2020-07-06 NOTE — Progress Notes (Signed)
E-Visit for Matthew Pratt   We are sorry that you are not feeling well.  Here is how we plan to help!  Based on what you have shared with me it looks like you have conjunctivitis.  Conjunctivitis is a common inflammatory or infectious condition of the eye that is often referred to as "pink eye".  In most cases it is contagious (viral or bacterial). However, not all conjunctivitis requires antibiotics (ex. Allergic).  We have made appropriate suggestions for you based upon your presentation.  I have prescribed Polytrim Ophthalmic drops 1-2 drops 4 times a day times 5 days. I also recommend you starting a daily zyrtec 10 mg daily.   Pink eye can be highly contagious.  It is typically spread through direct contact with secretions, or contaminated objects or surfaces that one may have touched.  Strict handwashing is suggested with soap and water is urged.  If not available, use alcohol based had sanitizer.  Avoid unnecessary touching of the eye.  If you wear contact lenses, you will need to refrain from wearing them until you see no white discharge from the eye for at least 24 hours after being on medication.  You should see symptom improvement in 1-2 days after starting the medication regimen.  Call us if symptoms are not improved in 1-2 days.  Home Care:  Wash your hands often!  Do not wear your contacts until you complete your treatment plan.  Avoid sharing towels, bed linen, personal items with a person who has pink eye.  See attention for anyone in your home with similar symptoms.  Get Help Right Away If:  Your symptoms do not improve.  You develop blurred or loss of vision.  Your symptoms worsen (increased discharge, pain or redness)  Your e-visit answers were reviewed by a board certified advanced clinical practitioner to complete your personal care plan.  Depending on the condition, your plan could have included both over the counter or prescription medications.  If there is a problem  please reply  once you have received a response from your provider.  Your safety is important to [place].  If you have drug allergies check your prescription carefully.    You can use MyChart to ask questions about today's visit, request a non-urgent call back, or ask for a work or school excuse for 24 hours related to this e-Visit. If it has been greater than 24 hours you will need to follow up with your provider, or enter a new e-Visit to address those concerns.   You will get an e-mail in the next two days asking about your experience.  I hope that your e-visit has been valuable and will speed your recovery. Thank you for using e-visits.   Approximately 5 minutes was spent documenting and reviewing patient's chart.

## 2020-07-07 DIAGNOSIS — M9901 Segmental and somatic dysfunction of cervical region: Secondary | ICD-10-CM | POA: Diagnosis not present

## 2020-07-07 DIAGNOSIS — M5441 Lumbago with sciatica, right side: Secondary | ICD-10-CM | POA: Diagnosis not present

## 2020-07-07 DIAGNOSIS — M9903 Segmental and somatic dysfunction of lumbar region: Secondary | ICD-10-CM | POA: Diagnosis not present

## 2020-07-07 DIAGNOSIS — M5032 Other cervical disc degeneration, mid-cervical region, unspecified level: Secondary | ICD-10-CM | POA: Diagnosis not present

## 2020-07-09 ENCOUNTER — Other Ambulatory Visit (INDEPENDENT_AMBULATORY_CARE_PROVIDER_SITE_OTHER): Payer: Medicare Other

## 2020-07-09 DIAGNOSIS — R945 Abnormal results of liver function studies: Secondary | ICD-10-CM | POA: Diagnosis not present

## 2020-07-09 LAB — HEPATIC FUNCTION PANEL
ALT: 57 U/L — ABNORMAL HIGH (ref 0–53)
AST: 25 U/L (ref 0–37)
Albumin: 4.2 g/dL (ref 3.5–5.2)
Alkaline Phosphatase: 86 U/L (ref 39–117)
Bilirubin, Direct: 0.2 mg/dL (ref 0.0–0.3)
Total Bilirubin: 0.9 mg/dL (ref 0.2–1.2)
Total Protein: 7.2 g/dL (ref 6.0–8.3)

## 2020-07-14 DIAGNOSIS — M9901 Segmental and somatic dysfunction of cervical region: Secondary | ICD-10-CM | POA: Diagnosis not present

## 2020-07-14 DIAGNOSIS — M5032 Other cervical disc degeneration, mid-cervical region, unspecified level: Secondary | ICD-10-CM | POA: Diagnosis not present

## 2020-07-14 DIAGNOSIS — M5441 Lumbago with sciatica, right side: Secondary | ICD-10-CM | POA: Diagnosis not present

## 2020-07-14 DIAGNOSIS — M9903 Segmental and somatic dysfunction of lumbar region: Secondary | ICD-10-CM | POA: Diagnosis not present

## 2020-07-17 DIAGNOSIS — M5441 Lumbago with sciatica, right side: Secondary | ICD-10-CM | POA: Diagnosis not present

## 2020-07-17 DIAGNOSIS — M9903 Segmental and somatic dysfunction of lumbar region: Secondary | ICD-10-CM | POA: Diagnosis not present

## 2020-07-17 DIAGNOSIS — M9901 Segmental and somatic dysfunction of cervical region: Secondary | ICD-10-CM | POA: Diagnosis not present

## 2020-07-17 DIAGNOSIS — M5032 Other cervical disc degeneration, mid-cervical region, unspecified level: Secondary | ICD-10-CM | POA: Diagnosis not present

## 2020-07-18 ENCOUNTER — Other Ambulatory Visit: Payer: Self-pay

## 2020-07-18 ENCOUNTER — Ambulatory Visit (INDEPENDENT_AMBULATORY_CARE_PROVIDER_SITE_OTHER): Payer: Medicare Other | Admitting: Family Medicine

## 2020-07-18 ENCOUNTER — Encounter: Payer: Self-pay | Admitting: Family Medicine

## 2020-07-18 VITALS — BP 110/68 | HR 67 | Temp 97.9°F | Ht 73.0 in | Wt 223.0 lb

## 2020-07-18 DIAGNOSIS — R894 Abnormal immunological findings in specimens from other organs, systems and tissues: Secondary | ICD-10-CM

## 2020-07-18 DIAGNOSIS — S22080A Wedge compression fracture of T11-T12 vertebra, initial encounter for closed fracture: Secondary | ICD-10-CM | POA: Diagnosis not present

## 2020-07-18 DIAGNOSIS — K760 Fatty (change of) liver, not elsewhere classified: Secondary | ICD-10-CM | POA: Diagnosis not present

## 2020-07-18 DIAGNOSIS — R748 Abnormal levels of other serum enzymes: Secondary | ICD-10-CM | POA: Diagnosis not present

## 2020-07-18 NOTE — Progress Notes (Signed)
Established Patient Office Visit  Subjective:  Patient ID: Matthew Pratt, male    DOB: October 31, 1951  Age: 69 y.o. MRN: [id number]  CC:  Chief Complaint  Patient presents with  . Hospitalization Follow-up    2 week f/u    HPI Case Vassell Alomar presents for follow-up from recent hospitalization.  Refer to note from 07/04/2020 for details  Kellin Bartling Threat presents for hospital follow-up.  He was admitted on 23 July.  He woke up that morning and had a typical breakfast of bran flakes and some fruit.  He developed fairly acute onset afterwards of epigastric abdominal pain associated with nausea and vomiting but no diarrhea.  He presented to the ED for further evaluation.  CT abdomen pelvis was concerning for possible acute cholecystitis.  MRCP showed fatty liver changes and some gallbladder wall thickening.  Abdominal ultrasound did not show any acute cholecystitis.  General surgery consulted.  HIDA scan came back negative.  His lipase was negative.  No history of alcohol use.  No history of hepatitis.  He had significantly elevated liver transaminases which did improve through the hospitalization.  There is history that he had been taking up to 3 g of Tylenol every day for the past few weeks for low back pain.  Hepatitis panel negative.  MRCP showed 1 cm dilation of common bile duct but no choledocholithiasis.  It was suspected that he had passed a stone.  There were no recommendations for cholecystectomy.  He did have incidental note of T12 compression fracture on CT and MRCP.  No pain at that level.  He has had some pain around L3.  He had recent DEXA scan which showed osteopenia and this was back in May.  He is taking some calcium and vitamin D.  No history of any recent injury or trauma.  He has hypothyroidism is on replacement.  Apparently several tests were done to evaluate his liver enzymes and these included herpes simplex virus 1 and 2 antibodies with slightly positive IgM.  He  states he has never had diagnosis of herpes nor has he ever had any typical skin lesions either orally or generally and has not been sexually active in over 8 years.  He does report recent shingles vaccine.  Overall, he feels better at this time.  His back pain is essentially resolved.  He has no abdominal pain.  No nausea or vomiting.  He predominately wanted to discuss the fatty liver changes today.  His liver transaminases were improving on recent labs.  There was no evidence for recent pancreatitis.  He does not consume any alcohol.  He has struggled to lose weight in the past.  Recent A1c 5.6%.  He has pending follow-up with GI and infectious disease.  Infectious disease referral was made apparently with regard to positive herpes simplex virus IgM antibody.  He has not had any other clinical manifestations of herpes and is low risk for recent infection  Past Medical History:  Diagnosis Date  . Anxiety   . Arthritis   . BPH (benign prostatic hypertrophy)   . CAD (coronary artery disease)    s/p STEMI 10/15/10 w/ Promus DES placed x1 in the first OM  . Colon polyps   . Depression   . Depression with anxiety   . Dupuytren's contracture of hand   . ED (erectile dysfunction)   . GERD (gastroesophageal reflux disease)   . Glaucoma   . HLD (hyperlipidemia)   . Hyperthyroidism   .  Hypocontractile bladder   . MI (myocardial infarction) ([place])   . OSA on CPAP   . Peyronie's disease   . Rosacea   . Skin cancer    BCC    Past Surgical History:  Procedure Laterality Date  . COLONOSCOPY WITH ESOPHAGOGASTRODUODENOSCOPY (EGD)     multiple  . CORONARY STENT PLACEMENT    . INNER EAR SURGERY    . LASIK  02/1998  . PENILE PROSTHESIS IMPLANT    . TONSILECTOMY, ADENOIDECTOMY, BILATERAL MYRINGOTOMY AND TUBES    . TONSILLECTOMY    . VASECTOMY    . WISDOM TOOTH EXTRACTION      Family History  Problem Relation Age of Onset  . COPD Other   . COPD Mother   . Varicose Veins Father   . Liver  cancer Father   . Basal cell carcinoma Father   . Basal cell carcinoma Sister   . Rheum arthritis Maternal Grandmother   . Heart failure Maternal Grandfather   . Colon cancer Paternal Grandmother   . Arthritis Paternal Grandfather   . Colon cancer Paternal Grandfather   . Melanoma Sister     Social History   Socioeconomic History  . Marital status: Divorced    Spouse name: Not on file  . Number of children: 2  . Years of education: Not on file  . Highest education level: Not on file  Occupational History  . Occupation: International aid/development worker    Comment: retired  Tobacco Use  . Smoking status: Never Smoker  . Smokeless tobacco: Never Used  Vaping Use  . Vaping Use: Never used  Substance and Sexual Activity  . Alcohol use: No  . Drug use: No  . Sexual activity: Not on file  Other Topics Concern  . Not on file  Social History Narrative   Divorced, 2 children   Retired Chief Financial Officer   No EtOH, tobacco, drugs   Social Determinants of Radio broadcast assistant Strain:   . Difficulty of Paying Living Expenses:   Food Insecurity:   . Worried About Charity fundraiser in the Last Year:   . Arboriculturist in the Last Year:   Transportation Needs:   . Film/video editor (Medical):   Marland Kitchen Lack of Transportation (Non-Medical):   Physical Activity:   . Days of Exercise per Week:   . Minutes of Exercise per Session:   Stress:   . Feeling of Stress :   Social Connections:   . Frequency of Communication with Friends and Family:   . Frequency of Social Gatherings with Friends and Family:   . Attends Religious Services:   . Active Member of Clubs or Organizations:   . Attends Archivist Meetings:   Marland Kitchen Marital Status:   Intimate Partner Violence:   . Fear of Current or Ex-Partner:   . Emotionally Abused:   Marland Kitchen Physically Abused:   . Sexually Abused:     Outpatient Medications Prior to Visit  Medication Sig Dispense Refill  . aspirin EC 81 MG tablet  Take 81 mg by mouth daily.     . cetirizine (ZYRTEC) 10 MG tablet Take 1 tablet (10 mg total) by mouth daily. 30 tablet 11  . cyclobenzaprine (FLEXERIL) 10 MG tablet Take 1 tablet (10 mg total) by mouth 3 (three) times daily as needed for muscle spasms. 21 tablet 0  . latanoprost (XALATAN) 0.005 % ophthalmic solution Place 1 drop into both eyes at bedtime.    Marland Kitchen  omeprazole (PRILOSEC) 10 MG capsule Take 1 capsule (10 mg total) by mouth daily. 90 capsule 3  . SYNTHROID 137 MCG tablet TAKE 1 TABLET EVERY DAY (Patient taking differently: Take 137 mcg by mouth daily before breakfast. ) 90 tablet 1  . timolol (TIMOPTIC) 0.5 % ophthalmic solution Place 1 drop into both eyes 2 (two) times daily.     Marland Kitchen trimethoprim-polymyxin b (POLYTRIM) ophthalmic solution Place 1 drop into the left eye every 6 (six) hours. 10 mL 0  . venlafaxine XR (EFFEXOR XR) 37.5 MG 24 hr capsule Take three capsules once daily (Patient taking differently: Take 112.5 mg by mouth every evening. ) 270 capsule 3   No facility-administered medications prior to visit.    Allergies  Allergen Reactions  . Crestor [Rosuvastatin]     Myalgia. Myalgia.   . Iodinated Diagnostic Agents Hives    Makes PT itchy  . Sulfa Antibiotics     REACTION: rash  . Sulfonamide Derivatives     REACTION: rash    ROS Review of Systems  Constitutional: Negative for appetite change, chills, fever and unexpected weight change.  Respiratory: Negative for shortness of breath.   Cardiovascular: Negative for chest pain.  Gastrointestinal: Negative for abdominal pain.  Musculoskeletal: Negative for back pain.      Objective:    Physical Exam Vitals reviewed.  Constitutional:      Appearance: Normal appearance.  Cardiovascular:     Rate and Rhythm: Normal rate and regular rhythm.  Pulmonary:     Effort: Pulmonary effort is normal.     Breath sounds: Normal breath sounds.  Abdominal:     Palpations: Abdomen is soft. There is no mass.      Tenderness: There is no abdominal tenderness.  Musculoskeletal:     Cervical back: Neck supple.     Right lower leg: No edema.     Left lower leg: No edema.  Neurological:     Mental Status: He is alert.     BP 110/68   Pulse 67   Temp 97.9 F (36.6 C) (Other (Comment))   Ht 6\' 1"  (1.854 m)   Wt 223 lb (101.2 kg)   SpO2 95%   BMI 29.42 kg/m  Wt Readings from Last 3 Encounters:  07/18/20 223 lb (101.2 kg)  07/04/20 (!) 223 lb 11.2 oz (101.5 kg)  06/27/20 (!) 220 lb (99.8 kg)     Health Maintenance Due  Topic Date Due  . INFLUENZA VACCINE  07/06/2020    There are no preventive care reminders to display for this patient.  Lab Results  Component Value Date   TSH 1.55 08/28/2019   Lab Results  Component Value Date   WBC 3.4 (L) 07/01/2020   HGB 13.0 07/01/2020   HCT 38.6 (L) 07/01/2020   MCV 102.1 (H) 07/01/2020   PLT 167 07/01/2020   Lab Results  Component Value Date   NA 141 07/01/2020   K 4.1 07/01/2020   CO2 29 07/01/2020   GLUCOSE 112 (H) 07/01/2020   BUN 7 (L) 07/01/2020   CREATININE 1.06 07/01/2020   BILITOT 0.9 07/09/2020   ALKPHOS 86 07/09/2020   AST 25 07/09/2020   ALT 57 (H) 07/09/2020   PROT 7.2 07/09/2020   ALBUMIN 4.2 07/09/2020   CALCIUM 8.9 07/01/2020   ANIONGAP 7 07/01/2020   GFR 73.77 08/06/2019   Lab Results  Component Value Date   CHOL 207 (H) 06/30/2020   Lab Results  Component Value Date   HDL  44 06/30/2020   Lab Results  Component Value Date   LDLCALC 145 (H) 06/30/2020   Lab Results  Component Value Date   TRIG 89 06/30/2020   Lab Results  Component Value Date   CHOLHDL 4.7 06/30/2020   Lab Results  Component Value Date   HGBA1C 5.6 06/29/2020      Assessment & Plan:   #1 nonalcoholic fatty liver disease by recent abdominal imaging.  He does not have any findings to suggest chronic liver disease at this time.  No alcohol use.  -We strongly advise he try to lose some weight.  We discussed strategies.  We  have discussed nutrition counseling in the past but he seems to have a fairly good handle on dietary factors -Would not consider medication such as Actos at this time in absence of diabetes -Recent hepatitis studies negative  #2 recent transaminase elevations probably secondary to gallstone.  Symptomatically stable at this time  #3 recent compression fracture T12 in the absence of any recent injury and asymptomatic.  Recent DEXA scan showed osteopenia but no osteoporosis.    #4 recent positive herpes simplex virus antibody in setting of asymptomatic individual who is low risk.  Question false positive  No orders of the defined types were placed in this encounter.   Follow-up: No follow-ups on file.    [name], MD

## 2020-07-18 NOTE — Patient Instructions (Signed)
Nonalcoholic Fatty Liver Disease Diet, Adult Nonalcoholic fatty liver disease is a condition that causes fat to build up in and around the liver. The disease makes it harder for the liver to work the way that it should. Following a healthy diet can help to keep nonalcoholic fatty liver disease under control. It can also help to prevent or improve conditions that are associated with the disease, such as heart disease, diabetes, high blood pressure, and abnormal cholesterol levels. Along with regular exercise, this diet:  Promotes weight loss.  Helps to control blood sugar levels.  Helps to improve the way that the body uses insulin. What are tips for following this plan? Reading food labels Always check food labels for:  The amount of saturated fat in a food. You should limit your intake of saturated fat. Saturated fat is found in foods that come from animals, including meat and dairy products such as butter, cheese, and whole milk.  The amount of fiber in a food. You should choose high-fiber foods such as fruits, vegetables, and whole grains. Try to get 25-30 grams (g) of fiber a day.  Cooking  When cooking, use heart-healthy oils that are high in monounsaturated fats. These include olive oil, canola oil, and avocado oil.  Limit frying or deep-frying foods. Cook foods using healthy methods such as baking, boiling, steaming, and grilling instead. Meal planning  You may want to keep track of how many calories you take in. Eating the right amount of calories will help you achieve a healthy weight. Meeting with a registered dietitian can help you get started.  Limit how often you eat takeout and fast food. These foods are usually very high in fat, salt, and sugar.  Use the glycemic index (GI) to plan your meals. The index tells you how quickly a food will raise your blood sugar. Choose low-GI foods (GI less than 55). These foods take a longer time to raise blood sugar. A registered dietitian  can help you identify foods lower on the GI scale. Lifestyle  You may want to follow a Mediterranean diet. This diet includes a lot of vegetables, lean meats or fish, whole grains, fruits, and healthy oils and fats. What foods can I eat?  Fruits Bananas. Apples. Oranges. Grapes. Papaya. Mango. Pomegranate. Kiwi. Grapefruit. Cherries. Vegetables Lettuce. Spinach. Peas. Beets. Cauliflower. Cabbage. Broccoli. Carrots. Tomatoes. Squash. Eggplant. Herbs. Peppers. Onions. Cucumbers. Brussels sprouts. Yams and sweet potatoes. Beans. Lentils. Grains Whole wheat or whole-grain foods, including breads, crackers, cereals, and pasta. Stone-ground whole wheat. Unsweetened oatmeal. Bulgur. Barley. Quinoa. Brown or wild rice. Corn or whole wheat flour tortillas. Meats and other proteins Lean meats. Poultry. Tofu. Seafood and shellfish. Dairy Low-fat or fat-free dairy products, such as yogurt, cottage cheese, or cheese. Beverages Water. Sugar-free drinks. Tea. Coffee. Low-fat or skim milk. Milk alternatives, such as soy or almond milk. Real fruit juice. Fats and oils Avocado. Canola or olive oil. Nuts and nut butters. Seeds. Seasonings and condiments Mustard. Relish. Low-fat, low-sugar ketchup and barbecue sauce. Low-fat or fat-free mayonnaise. Sweets and desserts Sugar-free sweets. The items listed above may not be a complete list of foods and beverages you can eat. Contact a dietitian for more information. What foods should I limit or avoid? Meats and other proteins Limit red meat to 1-2 times a week. Dairy Full-fat dairy. Fats and oils Palm oil and coconut oil. Fried foods. Other foods Processed foods. Foods that contain a lot of salt or sodium. Sweets and desserts Sweets that contain   sugar. Beverages Sweetened drinks, such as sweet tea, milkshakes, iced sweet drinks, and sodas. Alcohol. The items listed above may not be a complete list of foods and beverages you should avoid. Contact a  dietitian for more information. Where to find more information The National Institute of Diabetes and Digestive and Kidney Diseases: niddk.nih.gov Summary  Nonalcoholic fatty liver disease is a condition that causes fat to build up in and around the liver.  Following a healthy diet can help to keep nonalcoholic fatty liver disease under control. Your diet should be rich in fruits, vegetables, whole grains, and lean proteins.  Limit your intake of saturated fat. Saturated fat is found in foods that come from animals, including meat and dairy products such as butter, cheese, and whole milk.  This diet promotes weight loss, helps to control blood sugar levels, and helps to improve the way that the body uses insulin. This information is not intended to replace advice given to you by your health care provider. Make sure you discuss any questions you have with your health care provider. Document Revised: 03/16/2019 Document Reviewed: 12/14/2018 Elsevier Patient Education  2020 Elsevier Inc.  

## 2020-07-21 DIAGNOSIS — M9901 Segmental and somatic dysfunction of cervical region: Secondary | ICD-10-CM | POA: Diagnosis not present

## 2020-07-21 DIAGNOSIS — M9903 Segmental and somatic dysfunction of lumbar region: Secondary | ICD-10-CM | POA: Diagnosis not present

## 2020-07-21 DIAGNOSIS — M5441 Lumbago with sciatica, right side: Secondary | ICD-10-CM | POA: Diagnosis not present

## 2020-07-21 DIAGNOSIS — M5032 Other cervical disc degeneration, mid-cervical region, unspecified level: Secondary | ICD-10-CM | POA: Diagnosis not present

## 2020-07-22 ENCOUNTER — Encounter: Payer: Self-pay | Admitting: Internal Medicine

## 2020-07-22 ENCOUNTER — Ambulatory Visit (INDEPENDENT_AMBULATORY_CARE_PROVIDER_SITE_OTHER): Payer: Medicare Other | Admitting: Internal Medicine

## 2020-07-22 ENCOUNTER — Other Ambulatory Visit: Payer: Self-pay

## 2020-07-22 DIAGNOSIS — R894 Abnormal immunological findings in specimens from other organs, systems and tissues: Secondary | ICD-10-CM | POA: Diagnosis not present

## 2020-07-22 NOTE — Progress Notes (Signed)
[facility] for Infectious Disease  Reason for Consult: Possible HSV hepatitis Referring Provider: Dr. Gerrit Heck  Assessment: I think it is unlikely that his recent hepatitis and abdominal pain are due to acute herpes simplex infection.  HSV 1 and 2 been reported to cause rare cases of hepatitis but usually in the setting of immunosuppression or pregnancy.  I will repeat HSV 1 and 2 IgM and IgG testing hopefully clarify the situation for him.  Plan: 1. Repeat full HSV serologies 2. I will call him with results  Patient Active Problem List   Diagnosis Date Noted  . 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 Elevated LFTs 06/28/2020    Priority: High  . Hepatic steatosis 07/04/2020  . T12 compression fracture ([place]) 07/04/2020  . Abdominal pain 06/27/2020  . Osteopenia 04/19/2020  . Family history of colon cancer in 2 grandparents 10/28/2018  . NCGS (non-celiac gluten sensitivity) ? 10/28/2018  . 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 Hypothyroid 07/26/2018  . GERD (gastroesophageal reflux disease) 07/26/2018  . Barrett's esophagus 07/26/2018  . Rosacea 07/26/2018  . OSA (obstructive sleep apnea) 12/03/2016  . Depression 12/03/2016  . Hyperlipidemia 10/26/2010  . DEPRESSION/ANXIETY 10/26/2010  . GLAUCOMA 10/26/2010  . MYOCARDIAL INFARCTION, ACUTE, INFEROLATERAL 10/26/2010  . CAD, NATIVE VESSEL 10/26/2010  . SINUS BRADYCARDIA 10/26/2010    Patient's Medications  New Prescriptions   No medications on file  Previous Medications   ASPIRIN EC 81 MG TABLET    Take 81 mg by mouth daily.    CETIRIZINE (ZYRTEC) 10 MG TABLET    Take 1 tablet (10 mg total) by mouth daily.   CYCLOBENZAPRINE (FLEXERIL) 10 MG TABLET    Take 1 tablet (10 mg total) by mouth 3 (three) times daily as needed for muscle spasms.   LATANOPROST (XALATAN) 0.005 % OPHTHALMIC SOLUTION    Place 1 drop into both eyes at bedtime.    OMEPRAZOLE (PRILOSEC) 10 MG CAPSULE    Take 1 capsule (10 mg total) by mouth daily.   SYNTHROID 137 MCG TABLET    TAKE 1 TABLET EVERY DAY   TIMOLOL (TIMOPTIC) 0.5 % OPHTHALMIC SOLUTION    Place 1 drop into both eyes 2 (two) times daily.    TRIMETHOPRIM-POLYMYXIN B (POLYTRIM) OPHTHALMIC SOLUTION    Place 1 drop into the left eye every 6 (six) hours.   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  He was noted to have transaminitis.  He has a history of low back pain and says that he has been taking acetaminophen at maximal dose for many years.  Admission ultrasound showed evidence of hepatic steatosis.  CT scan also showed steatosis and mild inflammatory changes around the gallbladder representing possible acute cholecystitis.  There is also the incidental finding of a T12 compression fracture.  Abdominal MRI revealed gallbladder wall thickening and fusiform dilatation of the common bile duct.  No gallstones were noted.  The HIDA scan was negative.  Serologic testing for hepatitis A, B, C, CMV and EBV was negative.  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  He has no history herpes simplex infection.  He has no history of sexually transmitted diseases.  He has been divorced for the last 7 years.  He has not been sexually active in over 8 years.  His abdominal pain improved fairly promptly.  His liver enzymes started to come down.  When repeated on 07/09/2020 is AST was normal and his ALT was only slightly elevated at 57.  Review of Systems: Review of Systems  Constitutional: Negative for chills, diaphoresis, fever and weight loss.  Respiratory: Negative for cough and shortness of breath.     Cardiovascular: Negative for chest pain.  Gastrointestinal: Positive for abdominal pain, nausea and vomiting. Negative for constipation, diarrhea and heartburn.  Genitourinary: Negative for dysuria.  Musculoskeletal: Positive for back pain.  Skin: Negative for rash.      Past Medical History:  Diagnosis Date  . Anxiety   . Arthritis   . BPH (benign prostatic hypertrophy)   . CAD (coronary artery disease)    s/p STEMI 10/15/10 w/ Promus DES placed x1 in the first OM  . Colon polyps   . Depression   . Depression with anxiety   . Dupuytren's contracture of hand   . ED (erectile dysfunction)   . GERD (gastroesophageal reflux disease)   . Glaucoma   . HLD (hyperlipidemia)   . Hyperthyroidism   . Hypocontractile bladder   . MI (myocardial infarction) ([place])   . OSA on CPAP   . Peyronie's disease   . Rosacea   . Skin cancer    BCC    Social History   Tobacco Use  . Smoking status: Never Smoker  . Smokeless tobacco: Never Used  Vaping Use  . Vaping Use: Never used  Substance Use Topics  . Alcohol use: No  . Drug use: No    Family History  Problem Relation Age of Onset  . COPD Other   . COPD Mother   . Varicose Veins Father   . Liver cancer Father   . Basal cell carcinoma Father   . Basal cell carcinoma Sister   . Rheum arthritis Maternal Grandmother   . Heart failure Maternal Grandfather   . Colon cancer Paternal Grandmother   . Arthritis Paternal Grandfather   . Colon cancer Paternal Grandfather   . Melanoma Sister    Allergies  Allergen Reactions  . Crestor [Rosuvastatin]     Myalgia. Myalgia.   . Iodinated Diagnostic Agents Hives    Makes PT itchy  . Sulfa Antibiotics     REACTION: rash  . Sulfonamide Derivatives     REACTION: rash    OBJECTIVE: Vitals:   07/22/20 0849  BP: 120/77  Pulse: (!) 52  Temp: 97.9 F (36.6 C)  TempSrc: Oral  Weight: 225 lb (102.1 kg)   Body mass index is 29.69 kg/m.   Physical Exam Constitutional:       Comments: He is pleasant and in no distress.  Cardiovascular:     Rate and Rhythm: Normal rate and regular rhythm.     Heart sounds: No murmur heard.   Pulmonary:     Effort: Pulmonary effort is normal.     Breath sounds: Normal breath sounds.  Abdominal:     General: There is no distension.     Palpations: Abdomen is soft. There is no mass.     Tenderness: There is no abdominal tenderness.  Skin:    Findings: No rash.  Psychiatric:        Mood and Affect:  Mood normal.     Microbiology: No results found for this or any previous visit (from the past 240 hour(s)).  Matthew Bickers, MD [facility] [phone] pager   [phone] cell 07/22/2020, 9:18 AM

## 2020-07-22 NOTE — Addendum Note (Signed)
Addended by: Caffie Pinto on: 07/22/2020 10:15 AM   Modules accepted: Orders

## 2020-07-24 DIAGNOSIS — M9901 Segmental and somatic dysfunction of cervical region: Secondary | ICD-10-CM | POA: Diagnosis not present

## 2020-07-24 DIAGNOSIS — M9903 Segmental and somatic dysfunction of lumbar region: Secondary | ICD-10-CM | POA: Diagnosis not present

## 2020-07-24 DIAGNOSIS — M5441 Lumbago with sciatica, right side: Secondary | ICD-10-CM | POA: Diagnosis not present

## 2020-07-24 DIAGNOSIS — M5032 Other cervical disc degeneration, mid-cervical region, unspecified level: Secondary | ICD-10-CM | POA: Diagnosis not present

## 2020-07-25 LAB — HSV 1/2 AB (IGM), IFA W/RFLX TITER
HSV 1 IgM Screen: NEGATIVE
HSV 2 IgM Screen: NEGATIVE

## 2020-07-25 LAB — HSV 2 ANTIBODY, IGG: HSV 2 Glycoprotein G Ab, IgG: <0.9 index

## 2020-07-25 LAB — HSV 1 ANTIBODY, IGG: HSV 1 Glycoprotein G Ab, IgG: <0.9 index

## 2020-07-28 ENCOUNTER — Telehealth: Payer: Self-pay | Admitting: Internal Medicine

## 2020-07-28 ENCOUNTER — Other Ambulatory Visit (INDEPENDENT_AMBULATORY_CARE_PROVIDER_SITE_OTHER): Payer: Medicare Other

## 2020-07-28 ENCOUNTER — Encounter: Payer: Self-pay | Admitting: Physician Assistant

## 2020-07-28 ENCOUNTER — Ambulatory Visit: Payer: Medicare Other | Admitting: Physician Assistant

## 2020-07-28 VITALS — BP 102/70 | HR 52 | Ht 73.5 in | Wt 223.0 lb

## 2020-07-28 DIAGNOSIS — M9903 Segmental and somatic dysfunction of lumbar region: Secondary | ICD-10-CM | POA: Diagnosis not present

## 2020-07-28 DIAGNOSIS — K838 Other specified diseases of biliary tract: Secondary | ICD-10-CM

## 2020-07-28 DIAGNOSIS — M5441 Lumbago with sciatica, right side: Secondary | ICD-10-CM | POA: Diagnosis not present

## 2020-07-28 DIAGNOSIS — M9901 Segmental and somatic dysfunction of cervical region: Secondary | ICD-10-CM | POA: Diagnosis not present

## 2020-07-28 DIAGNOSIS — R7989 Other specified abnormal findings of blood chemistry: Secondary | ICD-10-CM

## 2020-07-28 DIAGNOSIS — M5032 Other cervical disc degeneration, mid-cervical region, unspecified level: Secondary | ICD-10-CM | POA: Diagnosis not present

## 2020-07-28 LAB — HEPATIC FUNCTION PANEL
ALT: 43 U/L (ref 0–53)
AST: 30 U/L (ref 0–37)
Albumin: 4.4 g/dL (ref 3.5–5.2)
Alkaline Phosphatase: 69 U/L (ref 39–117)
Bilirubin, Direct: 0.1 mg/dL (ref 0.0–0.3)
Total Bilirubin: 0.7 mg/dL (ref 0.2–1.2)
Total Protein: 7.3 g/dL (ref 6.0–8.3)

## 2020-07-28 NOTE — Patient Instructions (Signed)
If you are age 71 or older, your body mass index should be between 23-30. Your Body mass index is 29.02 kg/m. If this is out of the aforementioned range listed, please consider follow up with your Primary Care Provider.  If you are age 70 or younger, your body mass index should be between 19-25. Your Body mass index is 29.02 kg/m. If this is out of the aformentioned range listed, please consider follow up with your Primary Care Provider.   You have been scheduled for an MRI at [facility] on 08/05/20 . Your appointment time is 4:00 pm. Please arrive 30 minutes prior to your appointment time for registration purposes. Please make certain not to have anything to eat or drink 4 hours prior to your test. In addition, if you have any metal in your body, have a pacemaker or defibrillator, please be sure to let your ordering physician know. This test typically takes 45 minutes to 1 hour to complete. Should you need to reschedule, please call [phone] to do so.  Your provider has requested that you go to the basement level for lab work before leaving today. Press "B" on the elevator. The lab is located at the first door on the left as you exit the elevator.  Due to recent changes in healthcare laws, you may see the results of your imaging and laboratory studies on MyChart before your provider has had a chance to review them.  We understand that in some cases there may be results that are confusing or concerning to you. Not all laboratory results come back in the same time frame and the provider may be waiting for multiple results in order to interpret others.  Please give [place] 48 hours in order for your provider to thoroughly review all the results before contacting the office for clarification of your results.   Follow up pending the results of your imaging and lab.

## 2020-07-28 NOTE — Telephone Encounter (Signed)
I called and let Matthew Pratt know that the repeat herpes simplex 1 and 2 IgM and IgG antibodies were all negative.  The previous positive IgM antibody was a false positive.  He does not need any further testing for this.

## 2020-07-28 NOTE — Progress Notes (Signed)
Subjective:    Patient ID: Matthew Pratt, male    DOB: 29-Jan-1951, 69 y.o.   MRN: [id number]  HPI Matthew Pratt is a pleasant 69 year old white male, established with Dr. Leone Payor who comes in today for post hospital follow-up.  He had recent admission July 2021 with an acute episode of severe epigastric abdominal pain, nausea and vomiting.  Work-up in the emergency room revealed elevated LFTs with T bili of 2.1/alk phos 91/AST 206/ALT 144.  CBC was normal as was lipase. He underwent extensive evaluation including upper abdominal ultrasound which did not show any gallstones but showed a 6 mm CBD.  Subsequent CT scan showed hepatic steatosis, normal-appearing pancreas, mild gallbladder wall thickening and adjacent fat stranding, no ductal dilation.  Also noted to have a T12 compression fracture. MRCP confirmed gallbladder wall thickening to 6 mm, no gallstones and a common bile duct of 1 cm with focal narrowing just before the ampulla but no evidence of choledocholithiasis. Surgery was consulted as well as GI, surgery felt unlikely but possible calculus cholecystitis.  He was started on IV antibiotics.  HIDA scan was obtained which showed prompt uptake and EF of 52%. He had work-up for acute hepatitis with negative serologic work-up.  Abdominal pain resolved quickly and he did not have any recurrence.  LFTs were trending down and he was discharged home on 07/01/2020. Etiology of the transaminitis was not clear, he did have some evidence of hepatic steatosis.  He had also been taking quite a bit of Tylenol on a regular basis, acetaminophen level was normal. Was felt he may have had early acute acalculous cholecystitis, and question of passage of small stone versus sludge.  He also had pending HSV level on discharge.  This returned positive IgM.  He was subsequently referred to ID and additional serology was done and negative.  ID felt it was very unlikely that HSV would have caused his symptoms.   Interestingly the patient had just under gone a Shingrix vaccine in June 2021.  Since discharge from the hospital he has been feeling well.  He has not had any recurrence of abdominal pain, appetite has been fine.  He says his abdomen feels a bit fuller and he has had a little discomfort with bending over at times but attributes this to weight gain.  No fever or chills, no nausea or vomiting. Repeat LFTs on 07/09/2020 all normal with exception of ALT of 57.  Plan was for eventual repeat MRI abdomen/MRCP to assure resolution of ductal dilation.  Review of Systems Pertinent positive and negative review of systems were noted in the above HPI section.  All other review of systems was otherwise negative.  Outpatient Encounter Medications as of 07/28/2020  Medication Sig   aspirin EC 81 MG tablet Take 81 mg by mouth daily.    latanoprost (XALATAN) 0.005 % ophthalmic solution Place 1 drop into both eyes at bedtime.   omeprazole (PRILOSEC) 10 MG capsule Take 1 capsule (10 mg total) by mouth daily.   SYNTHROID 137 MCG tablet TAKE 1 TABLET EVERY DAY (Patient taking differently: Take 137 mcg by mouth daily before breakfast. )   timolol (TIMOPTIC) 0.5 % ophthalmic solution Place 1 drop into both eyes 2 (two) times daily.    venlafaxine XR (EFFEXOR XR) 37.5 MG 24 hr capsule Take three capsules once daily (Patient taking differently: Take 112.5 mg by mouth every evening. )   [DISCONTINUED] cetirizine (ZYRTEC) 10 MG tablet Take 1 tablet (10 mg total) by mouth daily.   [  DISCONTINUED] cyclobenzaprine (FLEXERIL) 10 MG tablet Take 1 tablet (10 mg total) by mouth 3 (three) times daily as needed for muscle spasms. (Patient not taking: Reported on 07/22/2020)   [DISCONTINUED] trimethoprim-polymyxin b (POLYTRIM) ophthalmic solution Place 1 drop into the left eye every 6 (six) hours. (Patient not taking: Reported on 07/22/2020)   No facility-administered encounter medications on file as of 07/28/2020.   Allergies    Allergen Reactions   Crestor [Rosuvastatin]     Myalgia. Myalgia.    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 10/28/2018   Irritable bowel syndrome with diarrhea 10/28/2018   Hypothyroid 07/26/2018   GERD (gastroesophageal reflux disease) 07/26/2018   Barrett's esophagus 07/26/2018   Rosacea 07/26/2018   OSA (obstructive sleep apnea) 12/03/2016   Depression 12/03/2016   Hyperlipidemia 10/26/2010   DEPRESSION/ANXIETY 10/26/2010   GLAUCOMA 10/26/2010   MYOCARDIAL INFARCTION, ACUTE, INFEROLATERAL 10/26/2010   CAD, NATIVE VESSEL 10/26/2010   SINUS BRADYCARDIA 10/26/2010   Social History   Socioeconomic History   Marital status: Divorced    Spouse name: Not on file   Number of children: 2   Years of education: Not on file   Highest education level: Not on file  Occupational History   Occupation: Contractor    Comment: retired  Tobacco Use   Smoking status: Never Smoker   Smokeless tobacco: Never Used  Building services engineer Use: Never used  Substance and Sexual Activity   Alcohol use: No   Drug use: No   Sexual activity: Not on file  Other Topics Concern   Not on file  Social History Narrative   Divorced, 2 children   Retired Art gallery manager   No EtOH, tobacco, drugs   Social Determinants of Corporate investment banker Strain:    Difficulty of Paying Living Expenses: Not on file  Food Insecurity:    Worried About Programme researcher, broadcasting/film/video in the  Last Year: Not on file   [employer] of Food in the Last Year: Not on file  Transportation Needs:    Lack of Transportation (Medical): Not on file   Lack of Transportation (Non-Medical): Not on file  Physical Activity:    Days of Exercise per Week: Not on file   Minutes of Exercise per Session: Not on file  Stress:    Feeling of Stress : Not on file  Social Connections:    Frequency of Communication with Friends and Family: Not on file   Frequency of Social Gatherings with Friends and Family: Not on file   Attends Religious Services: Not on file   Active Member of Clubs or Organizations: Not on file   Attends Banker Meetings: Not on file   Marital Status: Not on file  Intimate Partner Violence:    Fear of Current or Ex-Partner: Not on file   Emotionally Abused: Not on file   Physically Abused: Not on file   Sexually Abused: Not on file  Matthew Pratt family history includes Arthritis in his paternal grandfather; Basal cell carcinoma in his father and sister; COPD in his mother and another family member; Colon cancer in his paternal grandfather and paternal grandmother; Heart failure in his maternal grandfather; Liver cancer in his father; Melanoma in his sister; Rheum arthritis in his maternal grandmother; Varicose Veins in his father.      Objective:    Vitals:   07/28/20 1348  BP: 102/70  Pulse: (!) 52    Physical Exam Well-developed well-nourished WM  in no acute distress.  Height, Weight,223 BMI 29.02  HEENT; nontraumatic normocephalic, EOMI, PE RR LA, sclera anicteric. Oropharynx; not examined Neck; supple, no JVD Cardiovascular; regular rate and rhythm with S1-S2, no murmur rub or gallop Pulmonary; Clear bilaterally Abdomen; soft, nontender, nondistended, no palpable mass or hepatosplenomegaly, bowel sounds are active Rectal; not done today Skin; benign exam, no jaundice rash or appreciable lesions Extremities; no clubbing cyanosis or  edema skin warm and dry Neuro/Psych; alert and oriented x4, grossly nonfocal mood and affect appropriate       Assessment & Plan:   #65 69 year old white male here for post hospital follow-up after recent admission for acute severe epigastric pain nausea vomiting and elevated LFTs. Extensive work-up during hospitalization with no conclusive definitive diagnosis though question early a calculus cholecystitis, and also question possible passage of small stone versus sludge. CT scan and MRCP both showed gallbladder wall thickening, without stones and MRI/MRCP showed a dilated common bile duct of 1 cm without choledocholithiasis.  Patient has not had any recurrence of symptoms since discharge from the hospital, has been feeling well. LFTs continue to trend down as of 07/09/2020  #2+ HSV IgM-subsequent serologies per ID negative, and ID felt very unlikely HSV would have been responsible for any of the above symptoms. #3 coronary artery disease status post MI 4.  Sleep apnea 5.  GERD 6.  History of IBS 7.  Depression #8 hepatic steatosis  Plan; repeat hepatic panel today Patient will be scheduled for MRI/MRCP. Regarding hepatic steatosis, we discussed initial treatment with weight loss of 5 to 7%, regular exercise i.e. walking 30 minutes 5 times daily.  Suggest he start by limiting carbs. We will arrange office follow-up with Dr. Leone Payor or myself in 6 weeks. Further work-up pending results of MRCP.   Kymberlyn Eckford S Kinnedy Mongiello PA-C 07/28/2020   Cc: Kristian Covey, MD

## 2020-07-31 DIAGNOSIS — M9901 Segmental and somatic dysfunction of cervical region: Secondary | ICD-10-CM | POA: Diagnosis not present

## 2020-07-31 DIAGNOSIS — M5441 Lumbago with sciatica, right side: Secondary | ICD-10-CM | POA: Diagnosis not present

## 2020-07-31 DIAGNOSIS — M5032 Other cervical disc degeneration, mid-cervical region, unspecified level: Secondary | ICD-10-CM | POA: Diagnosis not present

## 2020-07-31 DIAGNOSIS — M9903 Segmental and somatic dysfunction of lumbar region: Secondary | ICD-10-CM | POA: Diagnosis not present

## 2020-08-04 ENCOUNTER — Telehealth: Payer: Self-pay | Admitting: Physician Assistant

## 2020-08-04 DIAGNOSIS — M5441 Lumbago with sciatica, right side: Secondary | ICD-10-CM | POA: Diagnosis not present

## 2020-08-04 DIAGNOSIS — M9903 Segmental and somatic dysfunction of lumbar region: Secondary | ICD-10-CM | POA: Diagnosis not present

## 2020-08-04 DIAGNOSIS — M5032 Other cervical disc degeneration, mid-cervical region, unspecified level: Secondary | ICD-10-CM | POA: Diagnosis not present

## 2020-08-04 DIAGNOSIS — M9901 Segmental and somatic dysfunction of cervical region: Secondary | ICD-10-CM | POA: Diagnosis not present

## 2020-08-04 NOTE — Telephone Encounter (Signed)
[facility] scheduling dept called to get MRI order in system corrected stated it should be with and without contrast patient going has appointment for tomorrow please rectify order.

## 2020-08-04 NOTE — Addendum Note (Signed)
Addended by: Aleatha Borer on: 08/04/2020 02:45 PM   Modules accepted: Orders

## 2020-08-04 NOTE — Telephone Encounter (Signed)
New order has been put in the system

## 2020-08-05 ENCOUNTER — Ambulatory Visit (HOSPITAL_COMMUNITY)
Admission: RE | Admit: 2020-08-05 | Discharge: 2020-08-05 | Disposition: A | Discharge status: Home / Self Care | Payer: Medicare Other | Source: Ambulatory Visit | Attending: Physician Assistant | Admitting: Physician Assistant

## 2020-08-05 ENCOUNTER — Other Ambulatory Visit: Payer: Self-pay | Admitting: Physician Assistant

## 2020-08-05 ENCOUNTER — Other Ambulatory Visit: Payer: Self-pay

## 2020-08-05 DIAGNOSIS — R7989 Other specified abnormal findings of blood chemistry: Secondary | ICD-10-CM

## 2020-08-05 DIAGNOSIS — K838 Other specified diseases of biliary tract: Secondary | ICD-10-CM

## 2020-08-05 MED ORDER — GADOBUTROL 1 MMOL/ML IV SOLN
10.0000 mL | Freq: Once | INTRAVENOUS | Status: AC | PRN
  Administered 2020-08-05: 10 mL via INTRAVENOUS

## 2020-08-07 DIAGNOSIS — M9903 Segmental and somatic dysfunction of lumbar region: Secondary | ICD-10-CM | POA: Diagnosis not present

## 2020-08-07 DIAGNOSIS — M9901 Segmental and somatic dysfunction of cervical region: Secondary | ICD-10-CM | POA: Diagnosis not present

## 2020-08-07 DIAGNOSIS — M5032 Other cervical disc degeneration, mid-cervical region, unspecified level: Secondary | ICD-10-CM | POA: Diagnosis not present

## 2020-08-07 DIAGNOSIS — M5441 Lumbago with sciatica, right side: Secondary | ICD-10-CM | POA: Diagnosis not present

## 2020-08-13 ENCOUNTER — Telehealth: Payer: Self-pay | Admitting: Physician Assistant

## 2020-08-13 DIAGNOSIS — M5032 Other cervical disc degeneration, mid-cervical region, unspecified level: Secondary | ICD-10-CM | POA: Diagnosis not present

## 2020-08-13 DIAGNOSIS — M9901 Segmental and somatic dysfunction of cervical region: Secondary | ICD-10-CM | POA: Diagnosis not present

## 2020-08-13 DIAGNOSIS — M5441 Lumbago with sciatica, right side: Secondary | ICD-10-CM | POA: Diagnosis not present

## 2020-08-13 DIAGNOSIS — M9903 Segmental and somatic dysfunction of lumbar region: Secondary | ICD-10-CM | POA: Diagnosis not present

## 2020-08-13 NOTE — Telephone Encounter (Signed)
Patient states he is returning someone's call no other information provided

## 2020-08-13 NOTE — Telephone Encounter (Signed)
Patient was called yesterday by another nurse with his imaging results.  Called the patient again. No answer. Got his voicemail. Left him a message that the imaging results were available through his My Chart! For his review. Advised he needs to schedule a follow up appointment with Dr Carlean Purl or Nicoletta Ba, next available.

## 2020-08-14 DIAGNOSIS — M5032 Other cervical disc degeneration, mid-cervical region, unspecified level: Secondary | ICD-10-CM | POA: Diagnosis not present

## 2020-08-14 DIAGNOSIS — M9901 Segmental and somatic dysfunction of cervical region: Secondary | ICD-10-CM | POA: Diagnosis not present

## 2020-08-14 DIAGNOSIS — M9903 Segmental and somatic dysfunction of lumbar region: Secondary | ICD-10-CM | POA: Diagnosis not present

## 2020-08-14 DIAGNOSIS — M5441 Lumbago with sciatica, right side: Secondary | ICD-10-CM | POA: Diagnosis not present

## 2020-08-18 DIAGNOSIS — M9903 Segmental and somatic dysfunction of lumbar region: Secondary | ICD-10-CM | POA: Diagnosis not present

## 2020-08-18 DIAGNOSIS — M5032 Other cervical disc degeneration, mid-cervical region, unspecified level: Secondary | ICD-10-CM | POA: Diagnosis not present

## 2020-08-18 DIAGNOSIS — M9901 Segmental and somatic dysfunction of cervical region: Secondary | ICD-10-CM | POA: Diagnosis not present

## 2020-08-18 DIAGNOSIS — M5441 Lumbago with sciatica, right side: Secondary | ICD-10-CM | POA: Diagnosis not present

## 2020-08-20 DIAGNOSIS — M5032 Other cervical disc degeneration, mid-cervical region, unspecified level: Secondary | ICD-10-CM | POA: Diagnosis not present

## 2020-08-20 DIAGNOSIS — M9903 Segmental and somatic dysfunction of lumbar region: Secondary | ICD-10-CM | POA: Diagnosis not present

## 2020-08-20 DIAGNOSIS — M9901 Segmental and somatic dysfunction of cervical region: Secondary | ICD-10-CM | POA: Diagnosis not present

## 2020-08-20 DIAGNOSIS — M5441 Lumbago with sciatica, right side: Secondary | ICD-10-CM | POA: Diagnosis not present

## 2020-09-04 DIAGNOSIS — M5441 Lumbago with sciatica, right side: Secondary | ICD-10-CM | POA: Diagnosis not present

## 2020-09-04 DIAGNOSIS — M9901 Segmental and somatic dysfunction of cervical region: Secondary | ICD-10-CM | POA: Diagnosis not present

## 2020-09-04 DIAGNOSIS — M5032 Other cervical disc degeneration, mid-cervical region, unspecified level: Secondary | ICD-10-CM | POA: Diagnosis not present

## 2020-09-04 DIAGNOSIS — M9903 Segmental and somatic dysfunction of lumbar region: Secondary | ICD-10-CM | POA: Diagnosis not present

## 2020-09-05 DIAGNOSIS — M5032 Other cervical disc degeneration, mid-cervical region, unspecified level: Secondary | ICD-10-CM | POA: Diagnosis not present

## 2020-09-05 DIAGNOSIS — M5441 Lumbago with sciatica, right side: Secondary | ICD-10-CM | POA: Diagnosis not present

## 2020-09-05 DIAGNOSIS — M9903 Segmental and somatic dysfunction of lumbar region: Secondary | ICD-10-CM | POA: Diagnosis not present

## 2020-09-05 DIAGNOSIS — M9901 Segmental and somatic dysfunction of cervical region: Secondary | ICD-10-CM | POA: Diagnosis not present

## 2020-09-08 ENCOUNTER — Other Ambulatory Visit: Payer: Self-pay

## 2020-09-08 ENCOUNTER — Other Ambulatory Visit: Payer: Medicare Other

## 2020-09-08 DIAGNOSIS — Z20822 Contact with and (suspected) exposure to covid-19: Secondary | ICD-10-CM | POA: Diagnosis not present

## 2020-09-10 LAB — NOVEL CORONAVIRUS, NAA: SARS-CoV-2, NAA: NOT DETECTED

## 2020-09-10 LAB — SARS-COV-2, NAA 2 DAY TAT

## 2020-09-11 ENCOUNTER — Telehealth: Payer: Self-pay | Admitting: Family Medicine

## 2020-09-11 NOTE — Progress Notes (Signed)
  Chronic Care Management   Note  09/11/2020 Name: Matthew Pratt MRN: [id number] DOB: Feb 18, 1951  Matthew Pratt is a 69 y.o. year old male who is a primary care patient of Burchette, Alinda Sierras, MD. I reached out to Matthew Pratt by phone today in response to a referral sent by Matthew Pratt PCP, Elease Hashimoto, Alinda Sierras, MD.   Matthew Pratt was given information about Chronic Care Management services today including:  1. CCM service includes personalized support from designated clinical staff supervised by his physician, including individualized plan of care and coordination with other care providers 2. 24/7 contact phone numbers for assistance for urgent and routine care needs. 3. Service will only be billed when office clinical staff spend 20 minutes or more in a month to coordinate care. 4. Only one practitioner may furnish and bill the service in a calendar month. 5. The patient may stop CCM services at any time (effective at the end of the month) by phone call to the office staff.   Patient agreed to services and verbal consent obtained.   Follow up plan:   Carley Perdue [employer] Scheduler

## 2020-09-12 DIAGNOSIS — M9903 Segmental and somatic dysfunction of lumbar region: Secondary | ICD-10-CM | POA: Diagnosis not present

## 2020-09-12 DIAGNOSIS — M5441 Lumbago with sciatica, right side: Secondary | ICD-10-CM | POA: Diagnosis not present

## 2020-09-12 DIAGNOSIS — M5032 Other cervical disc degeneration, mid-cervical region, unspecified level: Secondary | ICD-10-CM | POA: Diagnosis not present

## 2020-09-12 DIAGNOSIS — M9901 Segmental and somatic dysfunction of cervical region: Secondary | ICD-10-CM | POA: Diagnosis not present

## 2020-09-22 ENCOUNTER — Ambulatory Visit: Payer: Medicare Other | Admitting: Physician Assistant

## 2020-09-22 ENCOUNTER — Encounter: Payer: Self-pay | Admitting: Physician Assistant

## 2020-09-22 ENCOUNTER — Other Ambulatory Visit (INDEPENDENT_AMBULATORY_CARE_PROVIDER_SITE_OTHER): Payer: Medicare Other

## 2020-09-22 VITALS — BP 102/64 | HR 51 | Ht 73.0 in | Wt 227.0 lb

## 2020-09-22 DIAGNOSIS — R7989 Other specified abnormal findings of blood chemistry: Secondary | ICD-10-CM | POA: Diagnosis not present

## 2020-09-22 LAB — HEPATIC FUNCTION PANEL
ALT: 31 U/L (ref 0–53)
AST: 31 U/L (ref 0–37)
Albumin: 4.2 g/dL (ref 3.5–5.2)
Alkaline Phosphatase: 62 U/L (ref 39–117)
Bilirubin, Direct: 0.1 mg/dL (ref 0.0–0.3)
Total Bilirubin: 0.6 mg/dL (ref 0.2–1.2)
Total Protein: 7.2 g/dL (ref 6.0–8.3)

## 2020-09-22 NOTE — Progress Notes (Signed)
Subjective:    Patient ID: Matthew Pratt, male    DOB: 1951/04/14, 69 y.o.   MRN: [id number]  HPI  Matthew Pratt is a pleasant 69 year old white male, established with Dr. Leone Payor who comes in today for follow-up.  He was last seen on 07/28/2020 by myself after a hospitalization with an acute episode of severe epigastric pain nausea and vomiting.  This was associated with elevated LFTs.  He had extensive evaluation including upper abdominal ultrasound which did not show any gallstones but did show a CBD of 6 mm, subsequent CT scan showed hepatic steatosis, normal-appearing pancreas, mild gallbladder wall thickening and adjacent fat stranding and no ductal dilation.  He was also noted to have a T12 compression fracture. MRCP confirmed gallbladder wall thickening to 6 mm, no gallstones and a common bile duct of 1 cm with focal narrowing just before the ampulla but no evidence of choledocholithiasis.  Surgery was consulted, felt unlikely but possible calculus cholecystitis and he was started on IV antibiotics.  HIDA scan was normal with an EF of 52%.  Acute hepatitis work-up was negative.  His abdominal pain nausea and vomiting resolved and did not recur. When followed up on 07/28/2020 he had been doing well with no recurrence of symptoms.  Repeat LFTs were done which were completely normal.  He has since undergone a repeat MRCP which showed resolution of the gallbladder wall thickening and CBD of 9 mm, with upper normal in this age group 7 to 8 mm.  No stone or mass seen, was noted some gastric wall thickening along the greater curve at least partially felt secondary to under distention. Patient says he continues to do well, no recurrence of abdominal pain nausea or vomiting.  Appetite has been good, weight has been stable.  He had some oral surgery within the last week or so and did require an antibiotic and also received a Covid booster and a flu shot late last week which she says has caused quite a bit of  fatigue. Reviewing prior endoscopic evaluation last EGD done in 2019 in [facility] with finding of mild gastritis, there was question of Barrett's however biopsies were negative, H. pylori was negative. Colonoscopy 2019 done in [facility] was negative.  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  He has been having every 5 year surveillance.  Review of Systems Pertinent positive and negative review of systems were noted in the above HPI section.  All other review of systems was otherwise negative.  Outpatient Encounter Medications as of 09/22/2020  Medication Sig  . aspirin EC 81 MG tablet Take 81 mg by mouth daily.   Marland Kitchen latanoprost (XALATAN) 0.005 % ophthalmic solution Place 1 drop into both eyes at bedtime.  Marland Kitchen omeprazole (PRILOSEC) 10 MG capsule Take 1 capsule (10 mg total) by mouth daily.  Marland Kitchen SYNTHROID 137 MCG tablet TAKE 1 TABLET EVERY DAY (Patient taking differently: Take 137 mcg by mouth daily before breakfast. )  . timolol (TIMOPTIC) 0.5 % ophthalmic solution Place 1 drop into both eyes 2 (two) times daily.   Marland Kitchen venlafaxine XR (EFFEXOR XR) 37.5 MG 24 hr capsule Take three capsules once daily (Patient taking differently: Take 112.5 mg by mouth every evening. )   No facility-administered encounter medications on file as of 09/22/2020.   Allergies  Allergen Reactions  . Crestor [Rosuvastatin]     Myalgia.  Myalgia.   . Iodinated Diagnostic Agents Hives    Makes PT itchy  . Sulfa Antibiotics     REACTION: rash  . Sulfonamide Derivatives     REACTION: rash   Patient Active Problem List   Diagnosis Date Noted  . 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 Hepatic steatosis 07/04/2020  . T12 compression fracture (HCC) 07/04/2020  . Elevated LFTs 06/28/2020  . Abdominal pain 06/27/2020  . Osteopenia 04/19/2020  . Family history of colon cancer in 2  grandparents 10/28/2018  . NCGS (non-celiac gluten sensitivity) ? 10/28/2018  . 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 Hypothyroid 07/26/2018  . GERD (gastroesophageal reflux disease) 07/26/2018  . Barrett's esophagus 07/26/2018  . Rosacea 07/26/2018  . OSA (obstructive sleep apnea) 12/03/2016  . Depression 12/03/2016  . Hyperlipidemia 10/26/2010  . DEPRESSION/ANXIETY 10/26/2010  . GLAUCOMA 10/26/2010  . MYOCARDIAL INFARCTION, ACUTE, INFEROLATERAL 10/26/2010  . CAD, NATIVE VESSEL 10/26/2010  . SINUS BRADYCARDIA 10/26/2010   Social History   Socioeconomic History  . Marital status: Divorced    Spouse name: Not on file  . Number of children: 2  . Years of education: Not on file  . Highest education level: Not on file  Occupational History  . Occupation: Contractor    Comment: retired  Tobacco Use  . Smoking status: Never Smoker  . Smokeless tobacco: Never Used  Vaping Use  . Vaping Use: Never used  Substance and Sexual Activity  . Alcohol use: No  . Drug use: No  . Sexual activity: Not on file  Other Topics Concern  . Not on file  Social History Narrative   Divorced, 2 children   Retired Art gallery manager   No EtOH, tobacco, drugs   Social Determinants of Health   Financial Resource Strain:   . Difficulty of Paying Living Expenses: Not on file  Food Insecurity:   . Worried About Programme researcher, broadcasting/film/video in the Last Year: Not on file  . Ran Out of Food in the Last Year: Not on file  Transportation Needs:   . Lack of Transportation (Medical): Not on file  . Lack of Transportation (Non-Medical): Not on file  Physical Activity:   . Days of Exercise per Week: Not on file  . Minutes of Exercise per Session: Not on file  Stress:   . Feeling of Stress : Not on file  Social Connections:   . Frequency of Communication with Friends and Family: Not on file  . Frequency of Social Gatherings with Friends and Family: Not on file  . Attends Religious  Services: Not on file  . Active Member of Clubs or Organizations: Not on file  . Attends Banker Meetings: Not on file  . Marital Status: Not on file  Intimate Partner Violence:   . Fear of Current or Ex-Partner: Not on file  . Emotionally Abused: Not on file  . Physically Abused: Not on file  . Sexually Abused: Not on file    Mr. Rensberger family history includes Arthritis in his paternal grandfather; Basal cell carcinoma in his father and sister; COPD in his mother and another family member; Colon cancer in his paternal grandfather and paternal grandmother; Heart failure in his maternal grandfather; Liver cancer in his father; Melanoma in his sister; Rheum arthritis in his maternal grandmother; Varicose Veins in his father.      Objective:    Vitals:   09/22/20 0929  BP: 102/64  Pulse: (!) 51    Physical Exam Well-developed well-nourished older WM in no acute distress.  Height, [id number], BMI 29 HEENT; nontraumatic normocephalic, EOMI, PE RR LA, sclera anicteric. Oropharynx;not done Neck; supple, no JVD Cardiovascular; regular rate and rhythm with S1-S2, no murmur rub or gallop Pulmonary; Clear bilaterally Abdomen; soft, nontender, nondistended, no palpable mass or hepatosplenomegaly, bowel sounds are active Rectal; not done Skin; benign exam, no jaundice rash or appreciable lesions Extremities; no clubbing cyanosis or edema skin warm and dry Neuro/Psych; alert and oriented x4, grossly nonfocal mood and affect appropriate      Assessment & Plan:   Number one 69 year old white male hospitalized July 2021 with an acute episode of severe epigastric pain nausea vomiting and elevated LFTs.  Extensive work-up was no conclusive diagnosis though question early a calculus cholecystitis or passage of small stone or sludge. She had follow-up MRCP after last office visit that showed resolution of gallbladder wall thickening and a CBD of 9 mm which was improved and no  evidence of stone or mass. Repeat LFTs have been normal  2.  Fatty liver-most recent LFTs normal 3.  GERD stable 4.  Coronary artery disease status post MI 5.  History of IBS 6.  Sleep apnea 7.  Colon cancer surveillance-negative colonoscopy 2019 done in [place] due to family history will need follow-up 2024  Plan hepatic panel today Have discussed fatty liver with the patient at the time of last office visit, he is to work on about a 5% weight loss, regular mild exercise i.e. walking, and heart healthy diet. Would consider ultrasound with elastography in 1 year at follow-up. Patient will follow up with Dr. Leone Payor or myself in 1 year or sooner as needed. Plan follow-up colonoscopy 2024 per Dr. Leone Payor.  Matthew Pratt Oswald Hillock PA-C 09/22/2020   Cc: Kristian Covey, MD

## 2020-09-22 NOTE — Patient Instructions (Addendum)
If you are age 71 or older, your body mass index should be between 23-30. Your Body mass index is 29.95 kg/m. If this is out of the aforementioned range listed, please consider follow up with your Primary Care Provider.  If you are age 93 or younger, your body mass index should be between 19-25. Your Body mass index is 29.95 kg/m. If this is out of the aformentioned range listed, please consider follow up with your Primary Care Provider.   Your provider has requested that you go to the basement level for lab work before leaving today. Press "B" on the elevator. The lab is located at the first door on the left as you exit the elevator.  Follow up in 1 year with Dr. Carlean Purl or sooner if needed.

## 2020-09-23 ENCOUNTER — Ambulatory Visit (INDEPENDENT_AMBULATORY_CARE_PROVIDER_SITE_OTHER): Payer: Medicare Other | Admitting: Family Medicine

## 2020-09-23 ENCOUNTER — Other Ambulatory Visit: Payer: Self-pay

## 2020-09-23 ENCOUNTER — Encounter: Payer: Self-pay | Admitting: Family Medicine

## 2020-09-23 VITALS — BP 130/88 | HR 51 | Temp 98.1°F | Ht 73.0 in | Wt 228.6 lb

## 2020-09-23 DIAGNOSIS — R918 Other nonspecific abnormal finding of lung field: Secondary | ICD-10-CM

## 2020-09-23 NOTE — Progress Notes (Signed)
Established Patient Office Visit  Subjective:  Patient ID: Matthew Pratt, male    DOB: Apr 18, 1951  Age: 69 y.o. MRN: [id number]  CC:  Chief Complaint  Patient presents with  . discuss mri    discuss results, of mri that had at er, having having heavy in chest, having  ache pain , discuss side of medication     HPI Matthew Pratt presents for discussion of recent MRI he had of the abdomen.  He was admitted back on July 23 with epigastric abdominal pain, nausea, vomiting.  CT abdomen at that point showed concern for possible acute cholecystitis.  MRCP showed fatty liver changes and gallbladder wall thickening.  Ultrasound did not show acute cholecystitis.  HIDA scan came back negative.  Lipase negative.  He has been followed by GI and follow-up liver enzymes had come back down to normal.  On recent MRI abdomen with MRCP there was comment of "right lower lobe scarring ".  Patient has been on Effexor for quite some time for depression.  He had read reports of possible side effects of interstitial cystitis and eosinophilic pneumonitis.  He thinks he has had pneumonia previously but is not sure which lung.  He denies any cough, dyspnea, or any other respiratory symptoms.  He has noted that when he lays down flat on his back he has occasionally some sensation of achiness and heaviness in his chest.  Never exertional.  He tends to sleep a lot on his back.  Denies any active GERD symptoms currently.  No recent dysphagia.  No appetite or weight changes.  Past Medical History:  Diagnosis Date  . Anxiety   . Arthritis   . BPH (benign prostatic hypertrophy)   . CAD (coronary artery disease)    s/p STEMI 10/15/10 w/ Promus DES placed x1 in the first OM  . Colon polyps   . Depression   . Depression with anxiety   . Dupuytren's contracture of hand   . ED (erectile dysfunction)   . GERD (gastroesophageal reflux disease)   . Glaucoma   . HLD (hyperlipidemia)   . Hyperthyroidism   .  Hypocontractile bladder   . MI (myocardial infarction) ([place])   . OSA on CPAP   . Peyronie's disease   . Rosacea   . Skin cancer    BCC    Past Surgical History:  Procedure Laterality Date  . COLONOSCOPY WITH ESOPHAGOGASTRODUODENOSCOPY (EGD)     multiple  . CORONARY STENT PLACEMENT    . INNER EAR SURGERY    . LASIK  02/1998  . PENILE PROSTHESIS IMPLANT    . TONSILECTOMY, ADENOIDECTOMY, BILATERAL MYRINGOTOMY AND TUBES    . TONSILLECTOMY    . VASECTOMY    . WISDOM TOOTH EXTRACTION      Family History  Problem Relation Age of Onset  . COPD Other   . COPD Mother   . Varicose Veins Father   . Liver cancer Father   . Basal cell carcinoma Father   . Basal cell carcinoma Sister   . Rheum arthritis Maternal Grandmother   . Heart failure Maternal Grandfather   . Colon cancer Paternal Grandmother   . Arthritis Paternal Grandfather   . Colon cancer Paternal Grandfather   . Melanoma Sister     Social History   Socioeconomic History  . Marital status: Divorced    Spouse name: Not on file  . Number of children: 2  . Years of education: Not on file  . Highest  education level: Not on file  Occupational History  . Occupation: International aid/development worker    Comment: retired  Tobacco Use  . Smoking status: Never Smoker  . Smokeless tobacco: Never Used  Vaping Use  . Vaping Use: Never used  Substance and Sexual Activity  . Alcohol use: No  . Drug use: No  . Sexual activity: Not on file  Other Topics Concern  . Not on file  Social History Narrative   Divorced, 2 children   Retired Chief Financial Officer   No EtOH, tobacco, drugs   Social Determinants of Health   Financial Resource Strain:   . Difficulty of Paying Living Expenses: Not on file  Food Insecurity:   . Worried About Charity fundraiser in the Last Year: Not on file  . Ran Out of Food in the Last Year: Not on file  Transportation Needs:   . Lack of Transportation (Medical): Not on file  . Lack of Transportation  (Non-Medical): Not on file  Physical Activity:   . Days of Exercise per Week: Not on file  . Minutes of Exercise per Session: Not on file  Stress:   . Feeling of Stress : Not on file  Social Connections:   . Frequency of Communication with Friends and Family: Not on file  . Frequency of Social Gatherings with Friends and Family: Not on file  . Attends Religious Services: Not on file  . Active Member of Clubs or Organizations: Not on file  . Attends Archivist Meetings: Not on file  . Marital Status: Not on file  Intimate Partner Violence:   . Fear of Current or Ex-Partner: Not on file  . Emotionally Abused: Not on file  . Physically Abused: Not on file  . Sexually Abused: Not on file    Outpatient Medications Prior to Visit  Medication Sig Dispense Refill  . aspirin EC 81 MG tablet Take 81 mg by mouth daily.     Marland Kitchen latanoprost (XALATAN) 0.005 % ophthalmic solution Place 1 drop into both eyes at bedtime.    Marland Kitchen omeprazole (PRILOSEC) 10 MG capsule Take 1 capsule (10 mg total) by mouth daily. 90 capsule 3  . SYNTHROID 137 MCG tablet TAKE 1 TABLET EVERY DAY (Patient taking differently: Take 137 mcg by mouth daily before breakfast. ) 90 tablet 1  . timolol (TIMOPTIC) 0.5 % ophthalmic solution Place 1 drop into both eyes 2 (two) times daily.     Marland Kitchen venlafaxine XR (EFFEXOR XR) 37.5 MG 24 hr capsule Take three capsules once daily (Patient taking differently: Take 112.5 mg by mouth every evening. ) 270 capsule 3   No facility-administered medications prior to visit.    Allergies  Allergen Reactions  . Crestor [Rosuvastatin]     Myalgia. Myalgia.   . Iodinated Diagnostic Agents Hives    Makes PT itchy  . Sulfa Antibiotics     REACTION: rash  . Sulfonamide Derivatives     REACTION: rash    ROS Review of Systems  Constitutional: Negative for appetite change, chills, fever and unexpected weight change.  Respiratory: Negative for cough, shortness of breath and wheezing.     Cardiovascular: Negative for chest pain, palpitations and leg swelling.      Objective:    Physical Exam Vitals reviewed.  Constitutional:      Appearance: Normal appearance.  Cardiovascular:     Rate and Rhythm: Normal rate and regular rhythm.  Pulmonary:     Effort: Pulmonary effort is normal.  No respiratory distress.     Breath sounds: Normal breath sounds. No wheezing or rales.  Musculoskeletal:     Right lower leg: No edema.     Left lower leg: No edema.  Neurological:     Mental Status: He is alert.     BP 130/88 (BP Location: Left Arm, Patient Position: Sitting, Cuff Size: Normal)   Pulse (!) 51   Temp 98.1 F (36.7 C) (Oral)   Ht 6\' 1"  (1.854 m)   Wt 228 lb 9.6 oz (103.7 kg)   SpO2 94%   BMI 30.16 kg/m  Wt Readings from Last 3 Encounters:  09/23/20 228 lb 9.6 oz (103.7 kg)  09/22/20 227 lb (103 kg)  07/28/20 223 lb (101.2 kg)     There are no preventive care reminders to display for this patient.  There are no preventive care reminders to display for this patient.  Lab Results  Component Value Date   TSH 1.55 08/28/2019   Lab Results  Component Value Date   WBC 3.4 (L) 07/01/2020   HGB 13.0 07/01/2020   HCT 38.6 (L) 07/01/2020   MCV 102.1 (H) 07/01/2020   PLT 167 07/01/2020   Lab Results  Component Value Date   NA 141 07/01/2020   K 4.1 07/01/2020   CO2 29 07/01/2020   GLUCOSE 112 (H) 07/01/2020   BUN 7 (L) 07/01/2020   CREATININE 1.06 07/01/2020   BILITOT 0.6 09/22/2020   ALKPHOS 62 09/22/2020   AST 31 09/22/2020   ALT 31 09/22/2020   PROT 7.2 09/22/2020   ALBUMIN 4.2 09/22/2020   CALCIUM 8.9 07/01/2020   ANIONGAP 7 07/01/2020   GFR 73.77 08/06/2019   Lab Results  Component Value Date   CHOL 207 (H) 06/30/2020   Lab Results  Component Value Date   HDL 44 06/30/2020   Lab Results  Component Value Date   LDLCALC 145 (H) 06/30/2020   Lab Results  Component Value Date   TRIG 89 06/30/2020   Lab Results  Component Value  Date   CHOLHDL 4.7 06/30/2020   Lab Results  Component Value Date   HGBA1C 5.6 06/29/2020      Assessment & Plan:   Recent abnormal MRI scan with comment of right lower lobe scarring.  We explained this likely represents remote infection at some point.  There are no other concerning findings and he had CT abdomen pelvis which did not comment on any abnormal lung findings back over the summer.  He has not had any other worrisome symptoms such as dyspnea, hypoxia, cough to suggest likely progressive interstitial lung disease.  We did discuss the fact that chronic lung disease can be seen with Effexor but he has really no other symptoms at this time.  -We recommend observation at this time.  He will consider home pulse oximeter and monitor O2 sats.  Follow-up for any dyspnea or cough.  Otherwise plan 68-month follow-up  No orders of the defined types were placed in this encounter.   Follow-up: No follow-ups on file.    [name], MD

## 2020-09-23 NOTE — Patient Instructions (Signed)
Follow up for cough, shortness or breath or if oxygen sats dropping into the low 90s.

## 2020-10-02 ENCOUNTER — Encounter: Payer: Self-pay | Admitting: Family Medicine

## 2020-10-31 ENCOUNTER — Other Ambulatory Visit: Payer: Self-pay | Admitting: Family Medicine

## 2020-11-05 ENCOUNTER — Encounter: Payer: Self-pay | Admitting: Family Medicine

## 2020-11-10 ENCOUNTER — Other Ambulatory Visit: Payer: Self-pay

## 2020-11-10 ENCOUNTER — Other Ambulatory Visit (INDEPENDENT_AMBULATORY_CARE_PROVIDER_SITE_OTHER): Payer: Medicare Other

## 2020-11-10 DIAGNOSIS — K838 Other specified diseases of biliary tract: Secondary | ICD-10-CM

## 2020-11-10 DIAGNOSIS — R1084 Generalized abdominal pain: Secondary | ICD-10-CM | POA: Diagnosis not present

## 2020-11-10 LAB — COMPREHENSIVE METABOLIC PANEL
ALT: 146 U/L — ABNORMAL HIGH (ref 0–53)
AST: 34 U/L (ref 0–37)
Albumin: 4.3 g/dL (ref 3.5–5.2)
Alkaline Phosphatase: 76 U/L (ref 39–117)
BUN: 15 mg/dL (ref 6–23)
CO2: 29 mEq/L (ref 19–32)
Calcium: 9.4 mg/dL (ref 8.4–10.5)
Chloride: 101 mEq/L (ref 96–112)
Creatinine, Ser: 1.07 mg/dL (ref 0.40–1.50)
GFR: 69.7 mL/min (ref >60.00)
Glucose, Bld: 112 mg/dL — ABNORMAL HIGH (ref 70–99)
Potassium: 4.4 mEq/L (ref 3.5–5.1)
Sodium: 137 mEq/L (ref 135–145)
Total Bilirubin: 1 mg/dL (ref 0.2–1.2)
Total Protein: 7.4 g/dL (ref 6.0–8.3)

## 2020-11-10 LAB — CBC WITH DIFFERENTIAL/PLATELET
Basophils Absolute: 0.1 10*3/uL (ref 0.0–0.1)
Basophils Relative: 1.2 % (ref 0.0–3.0)
Eosinophils Absolute: 0.3 10*3/uL (ref 0.0–0.7)
Eosinophils Relative: 5.9 % — ABNORMAL HIGH (ref 0.0–5.0)
HCT: 42.8 % (ref 39.0–52.0)
Hemoglobin: 14.6 g/dL (ref 13.0–17.0)
Lymphocytes Relative: 37.1 % (ref 12.0–46.0)
Lymphs Abs: 1.6 10*3/uL (ref 0.7–4.0)
MCHC: 34 g/dL (ref 30.0–36.0)
MCV: 101.7 fl — ABNORMAL HIGH (ref 78.0–100.0)
Monocytes Absolute: 0.4 10*3/uL (ref 0.1–1.0)
Monocytes Relative: 9.6 % (ref 3.0–12.0)
Neutro Abs: 2 10*3/uL (ref 1.4–7.7)
Neutrophils Relative %: 46.2 % (ref 43.0–77.0)
Platelets: 189 10*3/uL (ref 150.0–400.0)
RBC: 4.21 Mil/uL — ABNORMAL LOW (ref 4.22–5.81)
RDW: 12.9 % (ref 11.5–15.5)
WBC: 4.4 10*3/uL (ref 4.0–10.5)

## 2020-11-11 ENCOUNTER — Other Ambulatory Visit: Payer: Self-pay

## 2020-11-11 ENCOUNTER — Ambulatory Visit (INDEPENDENT_AMBULATORY_CARE_PROVIDER_SITE_OTHER): Payer: Medicare Other | Admitting: Family Medicine

## 2020-11-11 ENCOUNTER — Encounter: Payer: Self-pay | Admitting: Family Medicine

## 2020-11-11 VITALS — BP 104/66 | HR 54 | Ht 73.0 in | Wt 225.0 lb

## 2020-11-11 DIAGNOSIS — R1013 Epigastric pain: Secondary | ICD-10-CM | POA: Diagnosis not present

## 2020-11-11 DIAGNOSIS — K219 Gastro-esophageal reflux disease without esophagitis: Secondary | ICD-10-CM | POA: Insufficient documentation

## 2020-11-11 DIAGNOSIS — J040 Acute laryngitis: Secondary | ICD-10-CM | POA: Insufficient documentation

## 2020-11-11 NOTE — Progress Notes (Addendum)
Established Patient Office Visit  Subjective:  Patient ID: Matthew Pratt, male    DOB: 06/19/1951  Age: 68 y.o. MRN: [id number]  CC:  Chief Complaint  Patient presents with  . Cholelithiasis    HPI Matthew Pratt presents for recent episode November 30 of about 8 hours of epigastric pain associated with some nausea without vomiting.  He called GI and they ordered CBC along with comprehensive metabolic panel.  CBC unremarkable.  ALT was slightly elevated 146.  Alkaline phosphatase and bilirubin levels normal.  He states his symptoms promptly resolved after about 8 hours.  There was no associated fever.  He had admission back in July with similar symptoms and went to the ED for further evaluation.  CT showed concern then for possible acute cholecystitis.  MRCP showed fatty liver changes and gallbladder wall thickening.  Abdominal ultrasound did not show any gallstones at that time.  HIDA scan was normal.  Lipase negative.  General surgery consulted and recommended observation.  MRCP back in the summer showed 1 cm dilation of the common bile duct but no choledocholithiasis.  There was suspicion that he had passed a stone at that time.  No recommendations for cholecystectomy.  He states the symptoms were very similar though perhaps lesser duration.  No hx of pancreatitis.  No ETOH .    No recent stool changes or hematemesis.  No chest pains.  No flank pain.   Past Medical History:  Diagnosis Date  . Anxiety   . Arthritis   . BPH (benign prostatic hypertrophy)   . CAD (coronary artery disease)    s/p STEMI 10/15/10 w/ Promus DES placed x1 in the first OM  . Colon polyps   . Depression   . Depression with anxiety   . Dupuytren's contracture of hand   . ED (erectile dysfunction)   . GERD (gastroesophageal reflux disease)   . Glaucoma   . HLD (hyperlipidemia)   . Hyperthyroidism   . Hypocontractile bladder   . MI (myocardial infarction) ([place])   . OSA on CPAP   . Peyronie's  disease   . Rosacea   . Skin cancer    BCC    Past Surgical History:  Procedure Laterality Date  . COLONOSCOPY WITH ESOPHAGOGASTRODUODENOSCOPY (EGD)     multiple  . CORONARY STENT PLACEMENT    . INNER EAR SURGERY    . LASIK  02/1998  . PENILE PROSTHESIS IMPLANT    . TONSILECTOMY, ADENOIDECTOMY, BILATERAL MYRINGOTOMY AND TUBES    . TONSILLECTOMY    . VASECTOMY    . WISDOM TOOTH EXTRACTION      Family History  Problem Relation Age of Onset  . COPD Other   . COPD Mother   . Varicose Veins Father   . Liver cancer Father   . Basal cell carcinoma Father   . Basal cell carcinoma Sister   . Rheum arthritis Maternal Grandmother   . Heart failure Maternal Grandfather   . Colon cancer Paternal Grandmother   . Arthritis Paternal Grandfather   . Colon cancer Paternal Grandfather   . Melanoma Sister     Social History   Socioeconomic History  . Marital status: Divorced    Spouse name: Not on file  . Number of children: 2  . Years of education: Not on file  . Highest education level: Not on file  Occupational History  . Occupation: International aid/development worker    Comment: retired  Tobacco Use  . Smoking  status: Never Smoker  . Smokeless tobacco: Never Used  Vaping Use  . Vaping Use: Never used  Substance and Sexual Activity  . Alcohol use: No  . Drug use: No  . Sexual activity: Not on file  Other Topics Concern  . Not on file  Social History Narrative   Divorced, 2 children   Retired Chief Financial Officer   No EtOH, tobacco, drugs   Social Determinants of Health   Financial Resource Strain:   . Difficulty of Paying Living Expenses: Not on file  Food Insecurity:   . Worried About Charity fundraiser in the Last Year: Not on file  . Ran Out of Food in the Last Year: Not on file  Transportation Needs:   . Lack of Transportation (Medical): Not on file  . Lack of Transportation (Non-Medical): Not on file  Physical Activity:   . Days of Exercise per Week: Not on file  .  Minutes of Exercise per Session: Not on file  Stress:   . Feeling of Stress : Not on file  Social Connections:   . Frequency of Communication with Friends and Family: Not on file  . Frequency of Social Gatherings with Friends and Family: Not on file  . Attends Religious Services: Not on file  . Active Member of Clubs or Organizations: Not on file  . Attends Archivist Meetings: Not on file  . Marital Status: Not on file  Intimate Partner Violence:   . Fear of Current or Ex-Partner: Not on file  . Emotionally Abused: Not on file  . Physically Abused: Not on file  . Sexually Abused: Not on file    Outpatient Medications Prior to Visit  Medication Sig Dispense Refill  . aspirin EC 81 MG tablet Take 81 mg by mouth daily.     Marland Kitchen latanoprost (XALATAN) 0.005 % ophthalmic solution Place 1 drop into both eyes at bedtime.    Marland Kitchen omeprazole (PRILOSEC) 10 MG capsule Take 1 capsule (10 mg total) by mouth daily. 90 capsule 3  . SYNTHROID 137 MCG tablet TAKE 1 TABLET EVERY DAY (Patient taking differently: Take 137 mcg by mouth daily before breakfast. ) 90 tablet 1  . timolol (TIMOPTIC) 0.5 % ophthalmic solution Place 1 drop into both eyes 2 (two) times daily.     Marland Kitchen venlafaxine XR (EFFEXOR-XR) 37.5 MG 24 hr capsule TAKE 3 CAPSULES BY MOUTH EVERY DAY 270 capsule 3   No facility-administered medications prior to visit.    Allergies  Allergen Reactions  . Crestor [Rosuvastatin]     Myalgia. Myalgia.   . Iodinated Diagnostic Agents Hives    Makes PT itchy  . Sulfa Antibiotics     REACTION: rash  . Sulfonamide Derivatives     REACTION: rash    ROS Review of Systems  Constitutional: Negative for chills and fever.  Respiratory: Negative for cough and shortness of breath.   Cardiovascular: Negative for chest pain.  Gastrointestinal: Negative for abdominal pain.  Neurological: Negative for dizziness.      Objective:    Physical Exam Vitals reviewed.  Constitutional:       Appearance: Normal appearance.  Cardiovascular:     Rate and Rhythm: Normal rate and regular rhythm.  Pulmonary:     Effort: Pulmonary effort is normal.     Breath sounds: Normal breath sounds.  Abdominal:     General: There is no distension.     Palpations: Abdomen is soft. There is no mass.     Tenderness:  There is no abdominal tenderness. There is no guarding or rebound.  Neurological:     Mental Status: He is alert.     BP 104/66   Pulse (!) 54   Ht 6\' 1"  (1.854 m)   Wt 225 lb (102.1 kg)   BMI 29.69 kg/m  Wt Readings from Last 3 Encounters:  11/11/20 225 lb (102.1 kg)  09/23/20 228 lb 9.6 oz (103.7 kg)  09/22/20 227 lb (103 kg)     There are no preventive care reminders to display for this patient.  There are no preventive care reminders to display for this patient.  Lab Results  Component Value Date   TSH 1.55 08/28/2019   Lab Results  Component Value Date   WBC 4.4 11/10/2020   HGB 14.6 11/10/2020   HCT 42.8 11/10/2020   MCV 101.7 (H) 11/10/2020   PLT 189.0 11/10/2020   Lab Results  Component Value Date   NA 137 11/10/2020   K 4.4 11/10/2020   CO2 29 11/10/2020   GLUCOSE 112 (H) 11/10/2020   BUN 15 11/10/2020   CREATININE 1.07 11/10/2020   BILITOT 1.0 11/10/2020   ALKPHOS 76 11/10/2020   AST 34 11/10/2020   ALT 146 (H) 11/10/2020   PROT 7.4 11/10/2020   ALBUMIN 4.3 11/10/2020   CALCIUM 9.4 11/10/2020   ANIONGAP 7 07/01/2020   GFR 69.70 11/10/2020   Lab Results  Component Value Date   CHOL 207 (H) 06/30/2020   Lab Results  Component Value Date   HDL 44 06/30/2020   Lab Results  Component Value Date   LDLCALC 145 (H) 06/30/2020   Lab Results  Component Value Date   TRIG 89 06/30/2020   Lab Results  Component Value Date   CHOLHDL 4.7 06/30/2020   Lab Results  Component Value Date   HGBA1C 5.6 06/29/2020      Assessment & Plan:   Recent episode of recurrent epigastric pain (?biliary colic) associated with nausea with duration  about 8 hours.  Patient had episode several months ago as above questionable choledocholithiasis with no gallstones noted on ultrasound. ?biliary sphincter of Oddi dysfunction-  previous lipase normal. .    He is back to baseline at this time.  Previous NM hepatobiliary imaging normal.  He has follow-up with GI Thursday to discuss next steps. He knows to avoid fatty foods in the meantime and to follow-up immediately for any fever, recurrent abdominal pain, or any recurrent vomiting  No orders of the defined types were placed in this encounter.   Follow-up: No follow-ups on file.    [name], MD

## 2020-11-11 NOTE — Patient Instructions (Signed)
Follow up immediately for any fever, recurrent abdominal pain, or recurrent vomiting.

## 2020-11-13 ENCOUNTER — Telehealth: Payer: Self-pay | Admitting: Internal Medicine

## 2020-11-13 ENCOUNTER — Encounter: Payer: Self-pay | Admitting: Internal Medicine

## 2020-11-13 ENCOUNTER — Ambulatory Visit: Payer: Medicare Other | Admitting: Internal Medicine

## 2020-11-13 ENCOUNTER — Other Ambulatory Visit (INDEPENDENT_AMBULATORY_CARE_PROVIDER_SITE_OTHER): Payer: Medicare Other

## 2020-11-13 VITALS — BP 80/60 | HR 56 | Ht 72.0 in | Wt 225.0 lb

## 2020-11-13 DIAGNOSIS — R7989 Other specified abnormal findings of blood chemistry: Secondary | ICD-10-CM | POA: Diagnosis not present

## 2020-11-13 DIAGNOSIS — R932 Abnormal findings on diagnostic imaging of liver and biliary tract: Secondary | ICD-10-CM

## 2020-11-13 DIAGNOSIS — R1013 Epigastric pain: Secondary | ICD-10-CM | POA: Diagnosis not present

## 2020-11-13 DIAGNOSIS — K838 Other specified diseases of biliary tract: Secondary | ICD-10-CM

## 2020-11-13 LAB — HEPATIC FUNCTION PANEL
ALT: 77 U/L — ABNORMAL HIGH (ref 0–53)
AST: 25 U/L (ref 0–37)
Albumin: 4.3 g/dL (ref 3.5–5.2)
Alkaline Phosphatase: 76 U/L (ref 39–117)
Bilirubin, Direct: 0.2 mg/dL (ref 0.0–0.3)
Total Bilirubin: 0.9 mg/dL (ref 0.2–1.2)
Total Protein: 7.3 g/dL (ref 6.0–8.3)

## 2020-11-13 NOTE — Patient Instructions (Signed)
Normal BMI (Body Mass Index- based on height and weight) is between 23 and 30. Your BMI today is Body mass index is 30.52 kg/m. Marland Kitchen Please consider follow up  regarding your BMI with your Primary Care Provider.   Your provider has requested that you go to the basement level for lab work before leaving today. Press "B" on the elevator. The lab is located at the first door on the left as you exit the elevator.  You have been scheduled for an abdominal ultrasound at [facility] on 11/27/2020 at 10:15AM. Please arrive 15 minutes prior to your appointment for registration. Make certain not to have anything to eat or drink 6 hours prior to your appointment. Should you need to reschedule your appointment, please contact radiology at [phone]. This test typically takes about 30 minutes to perform.  You have been scheduled for an appointment with Dr Rolm Bookbinder at [facility]. Your appointment is on 11/14/2020 at 9:50AM. Please arrive at 9:20AM for registration. Make certain to bring a list of current medications, including any over the counter medications or vitamins. Also bring your co-pay if you have one as well as your insurance cards. [facility] is located at [address], [address]. Should you need to reschedule your appointment, please contact them at [phone].    I appreciate the opportunity to care for you. Silvano Rusk, MD, [facility]

## 2020-11-13 NOTE — Progress Notes (Signed)
Matthew Pratt 69 y.o. November 16, 1951 [id number]  Assessment & Plan:   Encounter Diagnoses  Name Primary?  . Abdominal pain, epigastric Yes  . Common bile duct dilation - in past   . Elevated LFTs   . Abnormal gall bladder diagnostic imaging - past     This sounds like recurrent biliary colic even though we have not proven that he has cholelithiasis or choledocholithiasis.  In the past admission this summer question of a calculus cholecystitis.  At this point will recheck LFTs, resize the bile duct and look at the gallbladder with an ultrasound and send him to see Dr. Coralie Keens whom he saw in the hospital, regarding epigastric pain that sounds biliary in nature.  I told him that sometimes the gallbladder can be removed even if we do not have a test proving that was the cause and that he and Dr. Ninfa Linden could discuss that depending upon what else is seen on the repeat ultrasound.  It sounds to me like it would be reasonable to perform a cholecystectomy even if imaging does not confirm cholelithiasis but again would defer to the surgeon and the patient based upon that discussion.  I do not think an EGD or other imaging with respect to the GI tract would add anything here but will standby pending Dr. Trevor Mace reassessment.  I also do not think hepatic steatosis is causing these problems.  I appreciate the opportunity to care for this patient. CC: Matthew Post, MD Dr. Coralie Keens   Subjective:   Chief Complaint: Epigastric pain, question recurrent gallstone or bile duct stone  HPI Nidal is a 69 year old white man who has had problems off and on this year with epigastric pain with a question of a calculus cholecystitis when he was admitted to the hospital this summer.  He had an episode he was treated with antibiotics he had no gallstones he had a thickened gallbladder wall and a mildly dilated common bile duct on imaging including MRI, no stones were seen.  He saw  some follow-up twice in August and October repeat MRI with MRCP showed resolution of these abnormalities.  He does have hepatic steatosis.  Recently he developed another episode for about 8 hours of epigastric pain after eating a hamburger (he had been limiting fat intake in general otherwise) he called [place] he saw Dr. Elease Hashimoto.  We had him do labs 3 days ago and his LFTs were normal except for an ALT of 146.  CBC normal other than mild chronic macrocytosis of unclear etiology.  He feels completely well at this time.  He was also nauseous on 30 November with this episode.  He saw Dr. Elease Hashimoto on 12 7 I reviewed that note as well.  Wt Readings from Last 3 Encounters:  11/13/20 225 lb (102.1 kg)  11/11/20 225 lb (102.1 kg)  09/23/20 228 lb 9.6 oz (103.7 kg)    HPI from [place] PA-C note 07/28/2020 Matthew Pratt is a pleasant 69 year old white male, established with Dr. Carlean Purl who comes in today for Pratt hospital follow-up.  He had recent admission July 2021 with an acute episode of severe epigastric abdominal pain, nausea and vomiting.  Work-up in the emergency room revealed elevated LFTs with T bili of 2.1/alk phos 91/AST 206/ALT 144.  CBC was normal as was lipase. He underwent extensive evaluation including upper abdominal ultrasound which did not show any gallstones but showed a 6 mm CBD.  Subsequent CT scan showed hepatic steatosis, normal-appearing pancreas, mild gallbladder  wall thickening and adjacent fat stranding, no ductal dilation.  Also noted to have a T12 compression fracture. MRCP confirmed gallbladder wall thickening to 6 mm, no gallstones and a common bile duct of 1 cm with focal narrowing just before the ampulla but no evidence of choledocholithiasis. Surgery was consulted as well as GI, surgery felt unlikely but possible calculus cholecystitis.  He was started on IV antibiotics.  HIDA scan was obtained which showed prompt uptake and EF of 52%. He had work-up for acute hepatitis with  negative serologic work-up.  Abdominal pain resolved quickly and he did not have any recurrence.  LFTs were trending down and he was discharged home on 07/01/2020. Etiology of the transaminitis was not clear, he did have some evidence of hepatic steatosis.  He had also been taking quite a bit of Tylenol on a regular basis, acetaminophen level was normal. Was felt he may have had early acute acalculous cholecystitis, and question of passage of small stone versus sludge.  He also had pending HSV level on discharge.  This returned positive IgM.  He was subsequently referred to ID and additional serology was done and negative.  ID felt it was very unlikely that HSV would have caused his symptoms.  Interestingly the patient had just under gone a Shingrix vaccine in June 2021.  HPI from [place] note 09/22/2020  Matthew Pratt is a pleasant 69 year old white male, established with Dr. Carlean Purl who comes in today for follow-up.  He was last seen on 07/28/2020 by myself after a hospitalization with an acute episode of severe epigastric pain nausea and vomiting.  This was associated with elevated LFTs.  He had extensive evaluation including upper abdominal ultrasound which did not show any gallstones but did show a CBD of 6 mm, subsequent CT scan showed hepatic steatosis, normal-appearing pancreas, mild gallbladder wall thickening and adjacent fat stranding and no ductal dilation.  He was also noted to have a T12 compression fracture. MRCP confirmed gallbladder wall thickening to 6 mm, no gallstones and a common bile duct of 1 cm with focal narrowing just before the ampulla but no evidence of choledocholithiasis.  Surgery was consulted, felt unlikely but possible calculus cholecystitis and he was started on IV antibiotics.  HIDA scan was normal with an EF of 52%.  Acute hepatitis work-up was negative.  His abdominal pain nausea and vomiting resolved and did not recur. When followed up on 07/28/2020 he had been doing well  with no recurrence of symptoms.  Repeat LFTs were done which were completely normal.  He has since undergone a repeat MRCP which showed resolution of the gallbladder wall thickening and CBD of 9 mm, with upper normal in this age group 7 to 8 mm.  No stone or mass seen, was noted some gastric wall thickening along the greater curve at least partially felt secondary to under distention. Patient says he continues to do well, no recurrence of abdominal pain nausea or vomiting.  Appetite has been good, weight has been stable.  He had some oral surgery within the last week or so and did require an antibiotic and also received a Covid booster and a flu shot late last week which she says has caused quite a bit of fatigue. Reviewing prior endoscopic evaluation last EGD done in 2019 in [facility] with finding of mild gastritis, there was question of Barrett's however biopsies were negative, H. pylori was negative. Colonoscopy 2019 done in [facility] was negative.  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  He has been having every 5 year surveillance.   Allergies  Allergen Reactions  . Crestor [Rosuvastatin]     Myalgia. Myalgia.   . Iodinated Diagnostic Agents Hives    Makes PT itchy  . Sulfa Antibiotics     REACTION: rash  . Sulfonamide Derivatives     REACTION: rash   Current Meds  Medication Sig  . aspirin EC 81 MG tablet Take 81 mg by mouth daily.   Marland Kitchen latanoprost (XALATAN) 0.005 % ophthalmic solution Place 1 drop into both eyes at bedtime.  Marland Kitchen omeprazole (PRILOSEC) 10 MG capsule Take 1 capsule (10 mg total) by mouth daily.  Marland Kitchen SYNTHROID 137 MCG tablet TAKE 1 TABLET EVERY DAY (Patient taking differently: Take 137 mcg by mouth daily before breakfast.)  . timolol (TIMOPTIC) 0.5 % ophthalmic solution Place 1 drop into both eyes 2 (two) times daily.  Marland Kitchen venlafaxine XR (EFFEXOR-XR) 37.5 MG 24 hr  capsule TAKE 3 CAPSULES BY MOUTH EVERY DAY   Past Medical History:  Diagnosis Date  . Anxiety   . Arthritis   . BPH (benign prostatic hypertrophy)   . CAD (coronary artery disease)    s/p STEMI 10/15/10 w/ Promus DES placed x1 in the first OM  . Colon polyps   . Depression   . Depression with anxiety   . Dupuytren's contracture of hand   . ED (erectile dysfunction)   . GERD (gastroesophageal reflux disease)   . Glaucoma   . HLD (hyperlipidemia)   . Hyperthyroidism   . Hypocontractile bladder   . MI (myocardial infarction) ([place])   . OSA on CPAP   . Peyronie's disease   . Rosacea   . Skin cancer    BCC   Past Surgical History:  Procedure Laterality Date  . COLONOSCOPY WITH ESOPHAGOGASTRODUODENOSCOPY (EGD)     multiple  . CORONARY STENT PLACEMENT    . INNER EAR SURGERY    . LASIK  02/1998  . PENILE PROSTHESIS IMPLANT    . TONSILECTOMY, ADENOIDECTOMY, BILATERAL MYRINGOTOMY AND TUBES    . TONSILLECTOMY    . VASECTOMY    . WISDOM TOOTH EXTRACTION     Social History   Social History Narrative   Divorced, 2 children   Retired Chief Financial Officer   No EtOH, tobacco, drugs   family history includes Arthritis in his paternal grandfather; Basal cell carcinoma in his father and sister; COPD in his mother and another family member; Colon cancer in his paternal grandfather and paternal grandmother; Heart failure in his maternal grandfather; Liver cancer in his father; Melanoma in his sister; Rheum arthritis in his maternal grandmother; Varicose Veins in his father.   Review of Systems As per HPI  Objective:   Physical Exam BP (!) 80/60 (BP Location: Left Arm, Patient Position: Sitting, Cuff Size: Normal)   Pulse (!) 56   Ht 6' (1.829 m) Comment: height measured without shoes  Wt 225 lb (102.1 kg)   BMI 30.52 kg/m  No acute distress   Data reviewed labs, prior imaging this year hospitalization records this year prior GI notes in the office this year primary care note.

## 2020-11-13 NOTE — Telephone Encounter (Signed)
Matthew Pratt from [facility] is requesting a call back from a nurse to discuss the referral that is placed for Dr Ninfa Linden.  CB [phone]

## 2020-11-14 NOTE — Telephone Encounter (Signed)
[facility] has changed his appointment.  The patient is aware and they wanted Dr. Carlean Purl to be aware as well.   Dr. Carlean Purl notified

## 2020-11-19 ENCOUNTER — Telehealth: Payer: Self-pay | Admitting: *Deleted

## 2020-11-19 ENCOUNTER — Telehealth: Payer: Self-pay | Admitting: Pharmacist

## 2020-11-19 DIAGNOSIS — E039 Hypothyroidism, unspecified: Secondary | ICD-10-CM

## 2020-11-19 DIAGNOSIS — M8580 Other specified disorders of bone density and structure, unspecified site: Secondary | ICD-10-CM

## 2020-11-19 DIAGNOSIS — N486 Induration penis plastica: Secondary | ICD-10-CM

## 2020-11-19 DIAGNOSIS — G4733 Obstructive sleep apnea (adult) (pediatric): Secondary | ICD-10-CM

## 2020-11-19 DIAGNOSIS — I495 Sick sinus syndrome: Secondary | ICD-10-CM

## 2020-11-19 DIAGNOSIS — S22080A Wedge compression fracture of T11-T12 vertebra, initial encounter for closed fracture: Secondary | ICD-10-CM

## 2020-11-19 DIAGNOSIS — F329 Major depressive disorder, single episode, unspecified: Secondary | ICD-10-CM

## 2020-11-19 DIAGNOSIS — E785 Hyperlipidemia, unspecified: Secondary | ICD-10-CM

## 2020-11-19 DIAGNOSIS — R894 Abnormal immunological findings in specimens from other organs, systems and tissues: Secondary | ICD-10-CM

## 2020-11-19 NOTE — Chronic Care Management (AMB) (Signed)
Chronic Care Management Pharmacy Assistant   Name: Ohm Dentler Beamon  MRN: [id number] DOB: Apr 16, 1951  Reason for Encounter: Medication Review/Initial Questions for Pharmacist visit on 11-21-2020  Patient Questions: 1. Have you seen any other providers since your last visit?  Carlean Purl, Ofilia Neas, MD Gastroenterology 2. Any changes in your medications or health? No 3. Any side effects from any medications? No 4. Do you have any symptoms or problems not managed by your medications?  . He's having some stomach issues with his vitamins and prescription medications together. 5. Any concerns about your health right now? No 6. Has your provider asked that you check blood pressure, blood sugar, or follow a special diet at home? No 7. Do you get any type of exercise regularly?  No 8. Can you think of a goal you would like to reach for your health?  . He would like to lose 20 pounds . he would like to have his gallbladder removed soon  9. Do you have any problems getting your medications? No 10. Is there anything that you would like to discuss during the appointment? No  The patient was asked to please bring medications, blood pressure/ blood sugar log, and supplements to his appointment.           PCP : [name], MD  Allergies:   Allergies  Allergen Reactions  . Crestor [Rosuvastatin]     Myalgia. Myalgia.   . Iodinated Diagnostic Agents Hives    Makes PT itchy  . Sulfa Antibiotics     REACTION: rash  . Sulfonamide Derivatives     REACTION: rash    Medications: Outpatient Encounter Medications as of 11/19/2020  Medication Sig  . aspirin EC 81 MG tablet Take 81 mg by mouth daily.   Marland Kitchen latanoprost (XALATAN) 0.005 % ophthalmic solution Place 1 drop into both eyes at bedtime.  Marland Kitchen omeprazole (PRILOSEC) 10 MG capsule Take 1 capsule (10 mg total) by mouth daily.  Marland Kitchen SYNTHROID 137 MCG tablet TAKE 1 TABLET EVERY DAY (Patient taking differently: Take 137 mcg by mouth daily  before breakfast.)  . timolol (TIMOPTIC) 0.5 % ophthalmic solution Place 1 drop into both eyes 2 (two) times daily.  Marland Kitchen venlafaxine XR (EFFEXOR-XR) 37.5 MG 24 hr capsule TAKE 3 CAPSULES BY MOUTH EVERY DAY   No facility-administered encounter medications on file as of 11/19/2020.    Current Diagnosis: Patient Active Problem List   Diagnosis Date Noted  . Laryngitis from reflux of stomach acid 11/11/2020  . 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 Hepatic steatosis 07/04/2020  . T12 compression fracture ([place]) 07/04/2020  . Elevated LFTs 06/28/2020  . Abdominal pain 06/27/2020  . Mixed conductive and sensorineural hearing loss of left ear with restricted hearing of right ear 04/21/2020  . Osteopenia 04/19/2020  . Family history of colon cancer in 2 grandparents 10/28/2018  . NCGS (non-celiac gluten sensitivity) ? 10/28/2018  . 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 Hypothyroid 07/26/2018  . GERD (gastroesophageal reflux disease) 07/26/2018  . Barrett's esophagus 07/26/2018  . Rosacea 07/26/2018  . Medicare annual wellness visit, initial 02/24/2018  . Dupuytren's contracture of hand 06/23/2017  . H/O acute myocardial infarction 06/16/2017  . Fatigue 06/16/2017  . Benign prostatic hyperplasia with urinary hesitancy 06/16/2017  . OSA (obstructive sleep apnea) 12/03/2016  . Depression 12/03/2016  . Heartburn 10/15/2016  . Moderate episode of recurrent major depressive disorder ([place]) 07/22/2016  . Atypical chest pain 12/26/2015  .  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 Erectile dysfunction due to arterial insufficiency 03/26/2015  . 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  . Hyperlipidemia 10/26/2010  . DEPRESSION/ANXIETY 10/26/2010  . GLAUCOMA 10/26/2010  . MYOCARDIAL INFARCTION, ACUTE, INFEROLATERAL 10/26/2010  . CAD, NATIVE VESSEL 10/26/2010  . SINUS BRADYCARDIA 10/26/2010  . Glaucoma 10/26/2010    Goals Addressed   None     Follow-Up:   Pharmacist Review   Maia Breslow, [place] Assistant [phone]

## 2020-11-19 NOTE — Telephone Encounter (Signed)
-----   Message from Viona Gilmore, [facility] sent at 11/19/2020  8:30 AM EST ----- Regarding: CCM referral Good morning,  Can you please put in a CCM referral for [name], one of Dr. Erick Blinks patients?  Thank you, [name]

## 2020-11-20 NOTE — Progress Notes (Deleted)
Chronic Care Management Pharmacy  Name: Ruble Pumphrey Antrobus  MRN: [id number] DOB: 1951/03/13  Initial Planning Appointment: completed 11/19/20  Initial Questions: 1. Have you seen any other providers since your last visit? n/a 2. Any changes in your medicines or health? No   Chief Complaint/ HPI  Matthew Pratt,  69 y.o. , male presents for their Initial CCM visit with the clinical pharmacist In office.  PCP : [name], MD  Their chronic conditions include: {CHL AMB CHRONIC MEDICAL CONDITIONS:[phone]}  Office Visits: -11/11/20 [name], MD: Patient presented with abdominal pain. Plan to follow up with GI this week.  -09/23/20 [name], MD: Patient presented to discuss MRI and abnormal lung x-ray. Recommended observation.  -07/18/20 [name], MD: Patient presented for post hospitalization follow up.   -07/04/20 [name], MD: Patient presented for post hospitalization follow up. Follow up in 2 weeks.  Consult Visit: -11/13/20 Silvano Rusk, MD (gastro): Patient presented for IBS follow up. No changes made. Ultrasound ordered.  -09/22/20 Amy Easterwood, PA-C (gastro): Patient presented for elevated LFTs follow up. Follow up in 1 year.   -08/07/20 Eloy End (chiropractic medicine): Patient presented for follow up. Unable to access notes.  -07/28/20 Amy Easterwood, PA-C (gastro): Patient presented for elevated LFTs follow up post-hospitalization. Follow up in 6 weeks.  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  -7/23-06/11/20 Patient admitted for elevated LFTs and abdominal pain.   -06/12/20 Tama High (ophthalmology): Patient presented for eye exam. Unable to access notes.  Medications: Outpatient Encounter Medications as of 11/21/2020  Medication Sig  . aspirin EC 81 MG tablet Take 81 mg by mouth daily.   Marland Kitchen latanoprost (XALATAN) 0.005 % ophthalmic solution Place 1 drop into both  eyes at bedtime.  Marland Kitchen omeprazole (PRILOSEC) 10 MG capsule Take 1 capsule (10 mg total) by mouth daily.  Marland Kitchen SYNTHROID 137 MCG tablet TAKE 1 TABLET EVERY DAY (Patient taking differently: Take 137 mcg by mouth daily before breakfast.)  . timolol (TIMOPTIC) 0.5 % ophthalmic solution Place 1 drop into both eyes 2 (two) times daily.  Marland Kitchen venlafaxine XR (EFFEXOR-XR) 37.5 MG 24 hr capsule TAKE 3 CAPSULES BY MOUTH EVERY DAY   No facility-administered encounter medications on file as of 11/21/2020.     Current Diagnosis/Assessment:  Goals Addressed   None    GERD/Barrett's esophagus   Patient has failed these meds in past: *** Patient is currently {CHL Controlled/Uncontrolled:6031662113} on the following medications:  . Omeprazole 10 mg 1 capsule daily  We discussed:  Non-pharmacologic management of symptoms such as elevating the head of your bed, avoiding eating 2-3 hours before bed, avoiding triggering foods such as acidic, spicy, or fatty foods, eating smaller meals, and wearing clothes that are loose around the waist  Plan  Continue {CHL HP [facility] Pharmacy Plans:6812214142}  Hyperlipidemia   LDL goal < 70  Last lipids Lab Results  Component Value Date   CHOL 207 (H) 06/30/2020   HDL 44 06/30/2020   LDLCALC 145 (H) 06/30/2020   TRIG 89 06/30/2020   CHOLHDL 4.7 06/30/2020   Hepatic Function Latest Ref Rng & Units 11/13/2020 11/10/2020 09/22/2020  Total Protein 6.0 - 8.3 g/dL 7.3 7.4 7.2  Albumin 3.5 - 5.2 g/dL 4.3 4.3 4.2  AST 0 - 37 U/L 25 34 31  ALT 0 - 53 U/L 77(H) 146(H) 31  Alk Phosphatase 39 - 117 U/L 76 76 62  Total Bilirubin 0.2 - 1.2 mg/dL 0.9 1.0  0.6  Bilirubin, Direct 0.0 - 0.3 mg/dL 0.2 - 0.1     The ASCVD Risk score [name], et al., 2013) failed to calculate for the following reasons:   The patient has a prior MI or stroke diagnosis   Patient has failed these meds in past: atorvastatin, pravastatin, rosuvastatin (all myalgias) Patient is currently  uncontrolled on the following medications:  . No medications  We discussed:  diet and exercise extensively -Lowering cholesterol through diet by: Marland Kitchen Limiting foods with cholesterol such as liver and other organ meats, egg yolks, shrimp, and whole milk dairy products . Avoiding saturated fats and trans fats and incorporating healthier fats, such as lean meat, nuts, and unsaturated oils like canola and olive oils . Eating foods with soluble fiber such as whole-grain cereals such as oatmeal and oat bran, fruits such as apples, bananas, oranges, pears, and prunes, legumes such as kidney beans, lentils, chick peas, black-eyed peas, and lima beans, and green leafy vegetables . Limiting alcohol intake -Discussed recommendations for moderate aerobic exercise for 150 minutes/week spread out over 5 days for heart healthy lifestyle  Plan Zetia? Continue {CHL HP [facility] UKGUR:4270623762}  CAD/MI   Patient has failed these meds in past: *** Patient is currently {CHL Controlled/Uncontrolled:[phone]} on the following medications:  . Aspirin 81 mg 1 tablet daily  We discussed:  Monitoring for signs of bleeding such as unexplained and excessive bleeding from a cut or injury, easy or excessive bruising, blood in urine or stools, and nosebleeds without a known cause  Plan  Continue {CHL HP [facility] Pharmacy Plans:2523536442}   Hypothyroidism   Lab Results  Component Value Date/Time   TSH 1.55 08/28/2019 10:08 AM   TSH 3.05 07/26/2018 08:00 AM    Patient has failed these meds in past: none Patient is currently controlled on the following medications:  . Synthroid 137 mcg 1 tablet daily  We discussed:  taking this medication consistently on an empty stomach prior to all other medications and with water  Plan  Continue {CHL [facility] GBTDV:7616073710}  Depression   Depression screen [facility] 2/9 04/18/2020 01/29/2019 07/26/2018  Decreased Interest 0 3 0  Down, Depressed,  Hopeless 0 2 0  PHQ - 2 Score 0 5 0  Altered sleeping - 3 -  Tired, decreased energy - 3 -  Change in appetite - 1 -  Feeling bad or failure about yourself  - 3 -  Trouble concentrating - 1 -  Moving slowly or fidgety/restless - 1 -  Suicidal thoughts - 1 -  PHQ-9 Score - 18 -  Difficult doing work/chores - Somewhat difficult -    Patient has failed these meds in past: *** Patient is currently {CHL Controlled/Uncontrolled:[phone]} on the following medications:  . Venlafaxine XR 37.5 mg 3 capsules daily  We discussed:  ***  Plan  Continue {CHL [facility] GYIRS:8546270350}   Glaucoma   Patient has failed these meds in past: *** Patient is currently {CHL Controlled/Uncontrolled:[phone]} on the following medications:  . Latanoprost 0.005% 1 drop in both eyes at bedtime . Timolol 0.5% 1 drop in both eyes twice daily  We discussed:  ***  Plan  Continue {CHL HP [facility] Pharmacy Plans:2523536442}   Osteopenia    Last DEXA Scan: 04/16/20   T-Score femoral neck: RFN -1.2, LFN -1.3  T-Score total hip: n/a  T-Score lumbar spine: -1.3  T-Score forearm radius: n/a  10-year probability of major osteoporotic fracture: 9.9%  10-year probability of hip fracture:  3.1%  VITD  Date Value Ref Range Status  03/29/2019 47.44 30.00 - 100.00 ng/mL Final     Patient is not a candidate for pharmacologic treatment  Patient has failed these meds in past: none Patient is currently uncontrolled on the following medications:  Marland Kitchen Vitamin D and calcium  We discussed:  Recommend [phone] units of vitamin D daily. Recommend 1200 mg of calcium daily from dietary and supplemental sources.  Plan  Continue {CHL HP [facility] NGFRE:3200379444}   Vaccines   Reviewed and discussed patient's vaccination history.    Immunization History  Administered Date(s) Administered  . Fluad Quad(high Dose 65+) 08/10/2019  . Influenza, High Dose Seasonal PF 09/18/2020  .  Influenza-Unspecified 09/13/2016, 10/04/2017, 07/28/2018, 09/18/2020  . PFIZER SARS-COV-2 Vaccination 01/27/2020, 02/20/2020, 09/18/2020  . Pneumococcal Conjugate-13 06/16/2017, 06/16/2017  . Pneumococcal Polysaccharide-23 08/10/2019  . Tdap 08/21/2018  . Zoster Recombinat (Shingrix) 11/13/2019, 04/09/2020    Plan  Patient is up to date on all immunizations.   Medication Management   Patient's preferred pharmacy is:  [id number]- [place], [place] - [id number]: [phone]: [phone] Uses pill box? {Yes or If no, why not?:20788} Pt endorses ***% compliance  We discussed: {Pharmacy options:24294}  Plan  {US Pharmacy PYLTE:43539}   Follow up: *** month phone visit  MJeni Salles PharmD BColtonPharmacist LKonawaat [id number] [phone]

## 2020-11-21 ENCOUNTER — Ambulatory Visit: Payer: Medicare Other | Admitting: Pharmacist

## 2020-11-21 ENCOUNTER — Other Ambulatory Visit: Payer: Self-pay

## 2020-11-21 DIAGNOSIS — E785 Hyperlipidemia, unspecified: Secondary | ICD-10-CM

## 2020-11-21 DIAGNOSIS — M8580 Other specified disorders of bone density and structure, unspecified site: Secondary | ICD-10-CM

## 2020-11-21 DIAGNOSIS — E039 Hypothyroidism, unspecified: Secondary | ICD-10-CM

## 2020-11-21 DIAGNOSIS — E538 Deficiency of other specified B group vitamins: Secondary | ICD-10-CM

## 2020-11-21 NOTE — Chronic Care Management (AMB) (Signed)
Chronic Care Management Pharmacy  Name: Matthew Pratt  MRN: [id number] DOB: 03/15/1951  Initial Planning Appointment: completed 11/19/20  Initial Questions: 1. Have you seen any other providers since your last visit? n/a 2. Any changes in your medicines or health? No   Chief Complaint/ HPI  Matthew Pratt,  69 y.o. , male presents for their Initial CCM visit with the clinical pharmacist In office.  PCP : Matthew Covey, MD  Their chronic conditions include: GERD, HLD, CAD, hypothyroidism, depression, glaucoma, osteopenia  Office Visits: -11/11/20 Matthew Peat, MD: Patient presented with abdominal pain. Plan to follow up with GI this week.  -09/23/20 Matthew Peat, MD: Patient presented to discuss MRI and abnormal lung x-ray. Recommended observation.  -07/18/20 Matthew Peat, MD: Patient presented for post hospitalization follow up.   -07/04/20 Matthew Peat, MD: Patient presented for post hospitalization follow up. Follow up in 2 weeks.  Consult Visit: -11/13/20 Matthew Head, MD (gastro): Patient presented for IBS follow up. No changes made. Ultrasound ordered.  -09/22/20 Matthew Easterwood, PA-C (gastro): Patient presented for elevated LFTs follow up. Follow up in 1 year.   -08/07/20 Matthew Pratt (chiropractic medicine): Patient presented for follow up. Unable to access notes.  -07/28/20 Matthew Easterwood, PA-C (gastro): Patient presented for elevated LFTs follow up post-hospitalization. Follow up in 6 weeks.  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  -7/23-06/11/20 Patient admitted for elevated LFTs and abdominal pain.   -06/12/20 Matthew Pratt (ophthalmology): Patient presented for eye exam. Unable to access notes.  Medications: Outpatient Encounter Medications as of 11/21/2020  Medication Sig  . aspirin EC 81 MG tablet Take 81 mg by mouth daily.   Matthew Pratt latanoprost (XALATAN) 0.005 % ophthalmic solution Place  1 drop into both eyes at bedtime.  Matthew Pratt omeprazole (PRILOSEC) 10 MG capsule Take 1 capsule (10 mg total) by mouth daily.  Matthew Pratt SYNTHROID 137 MCG tablet TAKE 1 TABLET EVERY DAY (Patient taking differently: Take 137 mcg by mouth daily before breakfast.)  . timolol (TIMOPTIC) 0.5 % ophthalmic solution Place 1 drop into both eyes 2 (two) times daily.  Matthew Pratt venlafaxine XR (EFFEXOR-XR) 37.5 MG 24 hr capsule TAKE 3 CAPSULES BY MOUTH EVERY DAY   No facility-administered encounter medications on file as of 11/21/2020.   Patient lives alone in a 2 floor townhome. He does all his own cooking and does not eat out. He reports he is isolated at his age and does not exercise regularly. He gets really cold with hypothyoidism. He is currently working on projects at home and wants to go back to work. When he retired, there was no one to replace him so he was part time.  His normal day begins at 6:30am when he gets up and then eats a light breakfast, which consists of oatmeal, fruit, and coffee or 2 hard boiled eggs with grapefruit. His lunch is usually a sandwich with fruit. He makes foods in bulk and freezes them. He eats a lot more fruit but not many veggies due to the cost. He describes himself as ok at cooking but has cut back on the food budget substantially because of his lack of income. He tries to hang out with ex wife, friends from church and reports his sons are estranged.    Current Diagnosis/Assessment:  Goals Addressed            This Visit's Progress   . Pharmacy Care Plan       CARE PLAN ENTRY (  see longitudinal plan of care for additional care plan information)  Current Barriers:  . Chronic Disease Management support, education, and care coordination needs related to Hyperlipidemia, Coronary Artery Disease, GERD, Hypothyroidism, and Osteopenia   GERD/Barrett's esophagus . Pharmacist Clinical Goal(s): o Over the next 90 days, patient will work with PharmD and providers to manage symptoms of  heartburn . Current regimen:  o Omeprazole 20 mg 1 capsule daily . Interventions: o Discussed non-pharmacologic management of symptoms such as elevating the Pratt of your bed, avoiding eating 2-3 hours before bed, avoiding triggering foods such as acidic, spicy, or fatty foods, eating smaller meals, and wearing clothes that are loose around the waist . Patient self care activities - Over the next 90 days, patient will: o Continue current medication  Hyperlipidemia Lab Results  Component Value Date/Time   LDLCALC 145 (H) 06/30/2020 03:05 AM   . Pharmacist Clinical Goal(s): o Over the next 90 days, patient will work with PharmD and providers to achieve LDL goal < 70 . Current regimen:  o No medications . Interventions: o Discussed alternative cholesterol lowering medications o Lowering cholesterol through diet by: Matthew Pratt Limiting foods with cholesterol such as liver and other organ meats, egg yolks, shrimp, and whole milk dairy products . Avoiding saturated fats and trans fats and incorporating healthier fats, such as lean meat, nuts, and unsaturated oils like canola and olive oils . Eating foods with soluble fiber such as whole-grain cereals such as oatmeal and oat bran, fruits such as apples, bananas, oranges, pears, and prunes, legumes such as kidney beans, lentils, chick peas, black-eyed peas, and lima beans, and green leafy vegetables . Limiting alcohol intake o Discussed recommendations for moderate aerobic exercise for 150 minutes/week spread out over 5 days for heart healthy lifestyle . Patient self care activities - Over the next 90 days, patient will: o Continue making diet and exercise modifications to lower cholesterol  Hypothyroidism Lab Results  Component Value Date   TSH 1.55 08/28/2019   . Pharmacist Clinical Goal(s): o Over the next 90 days, patient will work with PharmD and providers to maintain TSH goal 0.35 to 4.5 . Current regimen:  o Synthroid 137 mcg 1 tablet  daily . Interventions: o Discussed consistent administration on an empty stomach before all other medications and with water . Patient self care activities - Over the next 90 days, patient will: o Continue current medication o Set an alarm on his phone for 10 minutes after his wake up alarm to remember to take thyroid medication  Coronary artery disease/history of heart attack . Pharmacist Clinical Goal(s) o Over the next 90 days, patient will work with PharmD and providers to prevent future heart attacks . Current regimen:  o Aspirin 81 mg 1 tablet daily . Interventions: o Discussed monitoring for signs of bleeding such as unexplained and excessive bleeding from a cut or injury, easy or excessive bruising, blood in urine or stools, and nosebleeds without a known cause . Patient self care activities - Over the next 90 days, patient will: o Continue current medication  Osteopenia . Pharmacist Clinical Goal(s) o Over the next 90 days, patient will work with PharmD and providers to strengthen bone density . Current regimen:  o No medications . Interventions: o Discussed recommendations for [phone] units of vitamin D daily and 1200 mg of calcium daily from dietary and supplemental sources. . Patient self care activities - Over the next 90 days, patient will: o Obtain repeat vitamin D level to determine if  needed to restart  Medication management . Pharmacist Clinical Goal(s): o Over the next 90 days, patient will work with PharmD and providers to achieve optimal medication adherence . [facility] . Interventions o Comprehensive medication review performed. o Continue current medication management strategy . Patient self care activities - Over the next 90 days, patient will: o Focus on medication adherence by setting an alarm for 10 minutes after waking up to remember to take thyroid medication o Take medications as prescribed o Report any questions or concerns to PharmD  and/or provider(s)  Initial goal documentation       SDOH Interventions   Flowsheet Row Most Recent Value  SDOH Interventions   Financial Strain Interventions Intervention Not Indicated  Transportation Interventions Intervention Not Indicated       GERD/Barrett's esophagus   Patient has failed these meds in past: none Patient is currently controlled on the following medications:  . Omeprazole 20 mg 1 capsule daily  We discussed:  Non-pharmacologic management of symptoms such as elevating the Pratt of your bed, avoiding eating 2-3 hours before bed, avoiding triggering foods such as acidic, spicy, or fatty foods, eating smaller meals, and wearing clothes that are loose around the waist  Plan  Continue current medications  Hyperlipidemia   LDL goal < 70  Last lipids Lab Results  Component Value Date   CHOL 207 (H) 06/30/2020   HDL 44 06/30/2020   LDLCALC 145 (H) 06/30/2020   TRIG 89 06/30/2020   CHOLHDL 4.7 06/30/2020   Hepatic Function Latest Ref Rng & Units 11/13/2020 11/10/2020 09/22/2020  Total Protein 6.0 - 8.3 g/dL 7.3 7.4 7.2  Albumin 3.5 - 5.2 g/dL 4.3 4.3 4.2  AST 0 - 37 U/L 25 34 31  ALT 0 - 53 U/L 77(H) 146(H) 31  Alk Phosphatase 39 - 117 U/L 76 76 62  Total Bilirubin 0.2 - 1.2 mg/dL 0.9 1.0 0.6  Bilirubin, Direct 0.0 - 0.3 mg/dL 0.2 - 0.1     The ASCVD Risk score (Brimhall Nizhoni., et al., 2013) failed to calculate for the following reasons:   The patient has a prior MI or stroke diagnosis   Patient has failed these meds in past: atorvastatin, pravastatin, rosuvastatin (all myalgias) Patient is currently uncontrolled on the following medications:  . No medications  We discussed:  diet and exercise extensively -Lowering cholesterol through diet by: Matthew Pratt Limiting foods with cholesterol such as liver and other organ meats, egg yolks, shrimp, and whole milk dairy products . Avoiding saturated fats and trans fats and incorporating healthier fats, such as lean  meat, nuts, and unsaturated oils like canola and olive oils . Eating foods with soluble fiber such as whole-grain cereals such as oatmeal and oat bran, fruits such as apples, bananas, oranges, pears, and prunes, legumes such as kidney beans, lentils, chick peas, black-eyed peas, and lima beans, and green leafy vegetables . Limiting alcohol intake -Discussed recommendations for moderate aerobic exercise for 150 minutes/week spread out over 5 days for heart healthy lifestyle -Discussed other cholesterol medications.  Plan Will discuss with PCP about PCSK9-inhibitors.  Continue control with diet and exercise  CAD/MI   Patient has failed these meds in past: none Patient is currently controlled on the following medications:  . Aspirin 81 mg 1 tablet daily  We discussed:  Monitoring for signs of bleeding such as unexplained and excessive bleeding from a cut or injury, easy or excessive bruising, blood in urine or stools, and nosebleeds without a known cause  Plan  Continue current medications   Hypothyroidism   Lab Results  Component Value Date/Time   TSH 1.55 08/28/2019 10:08 AM   TSH 3.05 07/26/2018 08:00 AM    Patient has failed these meds in past: none Patient is currently controlled on the following medications:  . Synthroid 137 mcg 1 tablet daily  We discussed:  taking this medication consistently on an empty stomach prior to all other medications and with water   Plan Sometimes he forgets to take - setting a alarm on phone for 10 minutes after getting up to remember to take - misses 30% of the time. Repeat TSH.  Continue current medications  Depression   Depression screen [facility] 2/9 04/18/2020 01/29/2019 07/26/2018  Decreased Interest 0 3 0  Down, Depressed, Hopeless 0 2 0  PHQ - 2 Score 0 5 0  Altered sleeping - 3 -  Tired, decreased energy - 3 -  Change in appetite - 1 -  Feeling bad or failure about yourself  - 3 -  Trouble concentrating - 1 -  Moving slowly or  fidgety/restless - 1 -  Suicidal thoughts - 1 -  PHQ-9 Score - 18 -  Difficult doing work/chores - Somewhat difficult -    Patient has failed these meds in past: SSRI (causes bruising), tries Patient is currently controlled on the following medications:  . Venlafaxine XR 37.5 mg 3 capsules daily  We discussed:  Patient had flu like symptoms with venlafaxine - went away with 3 months of use  Plan Managed by Dennison Nancy - sees every 5 weeks. Continue current medications   Glaucoma   Patient has failed these meds in past: none Patient is currently controlled on the following medications:  . Latanoprost 0.005% 1 drop in both eyes at bedtime . Timolol 0.5% 1 drop in both eyes twice daily  Plan  Continue current medications   Osteopenia    Last DEXA Scan: 04/16/20   T-Score femoral neck: RFN -1.2, LFN -1.3  T-Score total hip: n/a  T-Score lumbar spine: -1.3  T-Score forearm radius: n/a  10-year probability of major osteoporotic fracture: 9.9%  10-year probability of hip fracture: 3.1%  VITD  Date Value Ref Range Status  03/29/2019 47.44 30.00 - 100.00 ng/mL Final     Patient is not a candidate for pharmacologic treatment  Patient has failed these meds in past: none Patient is currently uncontrolled on the following medications:  . None  We discussed:  Recommend [phone] units of vitamin D daily. Recommend 1200 mg of calcium daily from dietary and supplemental sources.  Plan Recommend repeat vitamin D level.  Vitamins/supplements   Patient is currently NOT taking the following medications:   Calcium-mag-K 500-500-99 PRN   Vitamin C 1000 mg 1 tablet daily  Vitamin D 2000 units 1 tablet daily  Calcium 1000 mg 1 tablet daily  Vitamin B complex B12 15 mcg   Multivitamin 1 tablet daily  Turmeric 3100-9000 mg daily  Zinc 50 mg 1 tablet daily  Boswellia extract 500 mg 1 tablet as needed  We discussed: patient started having problems when he was taking all  of these supplements and stopped taking them as a result; he is unsure of which medications he needs  Plan Recommend vitamin B12 level. Continue without current medications.   Vaccines   Reviewed and discussed patient's vaccination history.    Immunization History  Administered Date(s) Administered  . Fluad Quad(high Dose 65+) 08/10/2019  . Influenza, High Dose Seasonal PF 09/18/2020  .  Influenza-Unspecified 09/13/2016, 10/04/2017, 07/28/2018, 09/18/2020  . PFIZER SARS-COV-2 Vaccination 01/27/2020, 02/20/2020, 09/18/2020  . Pneumococcal Conjugate-13 06/16/2017, 06/16/2017  . Pneumococcal Polysaccharide-23 08/10/2019  . Tdap 08/21/2018  . Zoster Recombinat (Shingrix) 11/13/2019, 04/09/2020    Plan  Patient is up to date on all immunizations.   Medication Management   Patient's preferred pharmacy is:  [id number] - [place], [place] - [address] AT [address] [place] [address] Phone: [phone] Fax: [phone]  Uses pill box? Yes - two pill boxes - 1 for AM and 1 for PM Pt endorses 90% compliance - morning medicines missed 30% of the time  We discussed: Current pharmacy is preferred with insurance plan and patient is satisfied with pharmacy services  Plan  Continue current medication management strategy   Follow up: 3 month phone visit  Jeni Salles, PharmD [place] Pharmacist [place] [facility] [place]

## 2020-11-24 ENCOUNTER — Other Ambulatory Visit: Payer: Self-pay | Admitting: *Deleted

## 2020-11-24 NOTE — Patient Outreach (Signed)
[facility]) Care Management [place] Telephone Outreach- care coordination case closure  11/24/2020  Matthew Pratt 1951-01-10 [id number]  [facility] CM Telephone Outreach Care Coordination/ Case Closure note  Routine referral received 11/19/20 from PCP office/ upstream pharmacist for "chronic CM;" in reviewing referral prior to outreaching patient, placed care coordination outreach to referral source, Quentin Cornwall, Upstream Pharmacist located at PCP office- to clarify patient CM needs; [name] confirmed that referral was not indicated and was placed as a result of a coding issue in the office that has now been resolved.  [name] clarified that patient has no chronic care management/ care coordination/ disease management/ community resource needs and advises that referral can be disregarded.  Oneta Rack, RN, BSN, [employer] [facility] Care Management  [phone]

## 2020-11-26 DIAGNOSIS — H401131 Primary open-angle glaucoma, bilateral, mild stage: Secondary | ICD-10-CM | POA: Diagnosis not present

## 2020-11-27 ENCOUNTER — Ambulatory Visit
Admission: RE | Admit: 2020-11-27 | Discharge: 2020-11-27 | Disposition: A | Discharge status: Home / Self Care | Payer: Medicare Other | Source: Ambulatory Visit | Attending: Internal Medicine | Admitting: Internal Medicine

## 2020-11-27 DIAGNOSIS — R1013 Epigastric pain: Secondary | ICD-10-CM

## 2020-11-27 DIAGNOSIS — R932 Abnormal findings on diagnostic imaging of liver and biliary tract: Secondary | ICD-10-CM

## 2020-11-27 DIAGNOSIS — R7989 Other specified abnormal findings of blood chemistry: Secondary | ICD-10-CM

## 2020-12-08 ENCOUNTER — Other Ambulatory Visit: Payer: Self-pay | Admitting: Surgery

## 2020-12-08 DIAGNOSIS — K8050 Calculus of bile duct without cholangitis or cholecystitis without obstruction: Secondary | ICD-10-CM | POA: Diagnosis not present

## 2020-12-08 NOTE — Progress Notes (Signed)
I have ordered the labs.  I would support him considering PCSK9 inhibitor if he is willing.  Would be helpful to see what is covered by his insurance.  Our patients have generally gotten these through cardiology through their lipid clinic.  We could prescribe but we had such a recent strain with staffing issues this may be a challenge

## 2020-12-08 NOTE — Addendum Note (Signed)
Addended by: Kristian Covey on: 12/08/2020 05:44 PM   Modules accepted: Orders

## 2020-12-11 NOTE — Progress Notes (Signed)
I just looked at his formulary and both Repatha and Praluent are the same copay of $47/month. I can also help facilitate the completion of patient assistance for one of them if you would rather start there instead of through the pharmacy.

## 2020-12-14 ENCOUNTER — Other Ambulatory Visit: Payer: Self-pay | Admitting: Family Medicine

## 2020-12-18 ENCOUNTER — Telehealth: Payer: Self-pay | Admitting: Internal Medicine

## 2020-12-18 NOTE — Telephone Encounter (Signed)
   [facility] Pre-operative Risk Assessment    HEARTCARE STAFF: - Please ensure there is not already an duplicate clearance open for this procedure. - Under Visit Info/Reason for Call, type in Other and utilize the format Clearance MM/DD/YY or Clearance TBD. Do not use dashes or single digits. - If request is for dental extraction, please clarify the # of teeth to be extracted.  Request for surgical clearance:  1. What type of surgery is being performed? gallbladder removal  2. When is this surgery scheduled? TBD   3. What type of clearance is required (medical clearance vs. Pharmacy clearance to hold med vs. Both)? Medical   4. Are there any medications that need to be held prior to surgery and how long? No   5. Practice name and name of physician performing surgery? [facility], Dr. Rhunette Croft  6. What is the office phone number? [phone]   7.   What is the office fax number? [phone]  8.   Anesthesia type (None, local, MAC, general) ? general   Selena Zobro 12/18/2020, 4:47 PM  _________________________________________________________________   (provider comments below)

## 2020-12-19 NOTE — Telephone Encounter (Signed)
Patient is being reestablished with [facility] later this month for preoperative clearance.  Will defer final clearance to MD.

## 2020-12-30 ENCOUNTER — Other Ambulatory Visit: Payer: Self-pay

## 2020-12-30 ENCOUNTER — Encounter: Payer: Self-pay | Admitting: Internal Medicine

## 2020-12-30 ENCOUNTER — Ambulatory Visit: Payer: Medicare Other | Admitting: Internal Medicine

## 2020-12-30 VITALS — BP 108/80 | HR 48 | Ht 72.0 in | Wt 228.2 lb

## 2020-12-30 DIAGNOSIS — I495 Sick sinus syndrome: Secondary | ICD-10-CM

## 2020-12-30 DIAGNOSIS — E785 Hyperlipidemia, unspecified: Secondary | ICD-10-CM

## 2020-12-30 DIAGNOSIS — G4733 Obstructive sleep apnea (adult) (pediatric): Secondary | ICD-10-CM

## 2020-12-30 DIAGNOSIS — I252 Old myocardial infarction: Secondary | ICD-10-CM

## 2020-12-30 DIAGNOSIS — Z01810 Encounter for preprocedural cardiovascular examination: Secondary | ICD-10-CM | POA: Diagnosis not present

## 2020-12-30 DIAGNOSIS — I2510 Atherosclerotic heart disease of native coronary artery without angina pectoris: Secondary | ICD-10-CM | POA: Diagnosis not present

## 2020-12-30 NOTE — Patient Instructions (Signed)
Medication Instructions:  No Changes In Medications at this time.  *If you need a refill on your cardiac medications before your next appointment, please call your pharmacy*  Testing/Procedures: Your physician has requested that you have an echocardiogram. Echocardiography is a painless test that uses sound waves to create images of your heart. It provides your doctor with information about the size and shape of your heart and how well your heart's chambers and valves are working. You may receive an ultrasound enhancing agent through an IV if needed to better visualize your heart during the echo.This procedure takes approximately one hour. There are no restrictions for this procedure. This will take place at the [address], [address].   Follow-Up: At [facility], you and your health needs are our priority.  As part of our continuing mission to provide you with exceptional heart care, we have created designated Provider Care Teams.  These Care Teams include your primary Cardiologist (physician) and Advanced Practice Providers (APPs -  Physician Assistants and Nurse Practitioners) who all work together to provide you with the care you need, when you need it.  Your next appointment:   6 month(s)  The format for your next appointment:   In Person  Provider:   Cherlynn Kaiser, MD  Other Instructions PLEASE MAKE AN APPOINTMENT WITH [facility] AT NEXT AVAILABLE TO DISCUSS POSSIBLE PCSK9.

## 2020-12-30 NOTE — Progress Notes (Unsigned)
Cardiology Office Note:    Date:  12/30/2020   ID:  Matthew Pratt, DOB 04-Nov-1951, MRN [id number]  PCP:  Matthew Post, MD  Cardiologist:  No primary care provider on file.  Electrophysiologist:  None   Referring MD: Matthew Post, MD   Chief Complaint/Reason for Referral: Preoperative cardiovascular review  History of Present Illness:    Matthew Pratt is a 69 y.o. male with a history of STEMI 10/15/10 with Promus DES placed x 1 in OM1, BPH, HLD, OSA on CPAP. Presents for preoperative cardiovascular review prior to elective cholecystectomy with Matthew Pratt, date TBD.   Previously followed with Matthew Pratt in [place], records not available. Last visit was at least 4 years ago per patient. We will request records from him.   He is able to exert himself >4 METS with no chest pain, no DOE. At time of MI in 2011, he had typical crushing chest pain. He has had no recurrence of this quality of pain. He feels he has been well since his last cardiology visit. Uses his recumbent bike at at least a moderate level with no symptoms. He has OSA and uses CPAP.   One lifetime episode of syncope, 35 years ago at work, remembers having tunnel vision.  Notes significant myalgias with statins. We discussed PCSK9I with indication of secondary prevention of CAD. He would like to consider and we will arrange CVRR visit.   The patient denies chest pain, chest pressure, dyspnea at rest or with exertion, palpitations, PND, orthopnea, or leg swelling. Denies cough, fever, chills. Denies nausea, vomiting. Denies recent syncope or presyncope. Denies dizziness or lightheadedness.   Past Medical History:  Diagnosis Date  . Anxiety   . Arthritis   . BPH (benign prostatic hypertrophy)   . CAD (coronary artery disease)    s/p STEMI 10/15/10 w/ Promus DES placed x1 in the first OM  . Colon polyps   . Depression   . Depression with anxiety   . Dupuytren's contracture of hand   . ED  (erectile dysfunction)   . GERD (gastroesophageal reflux disease)   . Glaucoma   . HLD (hyperlipidemia)   . Hyperthyroidism   . Hypocontractile bladder   . MI (myocardial infarction) ([place])   . OSA on CPAP   . Peyronie's disease   . Rosacea   . Skin cancer    BCC    Past Surgical History:  Procedure Laterality Date  . COLONOSCOPY WITH ESOPHAGOGASTRODUODENOSCOPY (EGD)     multiple  . CORONARY STENT PLACEMENT    . INNER EAR SURGERY    . LASIK  02/1998  . PENILE PROSTHESIS IMPLANT    . TONSILECTOMY, ADENOIDECTOMY, BILATERAL MYRINGOTOMY AND TUBES    . TONSILLECTOMY    . VASECTOMY    . WISDOM TOOTH EXTRACTION      Current Medications: Current Meds  Medication Sig  . aspirin EC 81 MG tablet Take 81 mg by mouth daily.   Marland Kitchen latanoprost (XALATAN) 0.005 % ophthalmic solution Place 1 drop into both eyes at bedtime.  Marland Kitchen omeprazole (PRILOSEC) 10 MG capsule TAKE 1 CAPSULE BY MOUTH EVERY DAY  . SYNTHROID 137 MCG tablet TAKE 1 TABLET EVERY DAY  . timolol (TIMOPTIC) 0.5 % ophthalmic solution Place 1 drop into both eyes 2 (two) times daily.  Marland Kitchen venlafaxine XR (EFFEXOR-XR) 37.5 MG 24 hr capsule TAKE 3 CAPSULES BY MOUTH EVERY DAY     Allergies:   Crestor [rosuvastatin], Iodinated diagnostic agents, Sulfa antibiotics, and Sulfonamide  derivatives   Social History   Tobacco Use  . Smoking status: Never Smoker  . Smokeless tobacco: Never Used  Vaping Use  . Vaping Use: Never used  Substance Use Topics  . Alcohol use: No  . Drug use: No     Family History: The patient's family history includes Arthritis in his paternal grandfather; Basal cell carcinoma in his father and sister; COPD in his mother and another family member; Colon cancer in his paternal grandfather and paternal grandmother; Heart failure in his maternal grandfather; Liver cancer in his father; Melanoma in his sister; Rheum arthritis in his maternal grandmother; Varicose Veins in his father.  ROS:   Please see the history of  present illness.    All other systems reviewed and are negative.  EKGs/Labs/Other Studies Reviewed:    The following studies were reviewed today:  EKG:  Sinus bradycardia 48 bpm   Recent Labs: 11/10/2020: BUN 15; Creatinine, Ser 1.07; Hemoglobin 14.6; Platelets 189.0; Potassium 4.4; Sodium 137 11/13/2020: ALT 77  Recent Lipid Panel    Component Value Date/Time   CHOL 207 (H) 06/30/2020 0305   TRIG 89 06/30/2020 0305   HDL 44 06/30/2020 0305   CHOLHDL 4.7 06/30/2020 0305   VLDL 18 06/30/2020 0305   LDLCALC 145 (H) 06/30/2020 0305    Physical Exam:    VS:  BP 108/80 (BP Location: Left Arm, Patient Position: Sitting)   Pulse (!) 48   Ht 6' (1.829 m)   Wt 228 lb 3.2 oz (103.5 kg)   SpO2 98%   BMI 30.95 kg/m     Wt Readings from Last 5 Encounters:  12/30/20 228 lb 3.2 oz (103.5 kg)  11/13/20 225 lb (102.1 kg)  11/11/20 225 lb (102.1 kg)  09/23/20 228 lb 9.6 oz (103.7 kg)  09/22/20 227 lb (103 kg)    Constitutional: No acute distress Eyes: sclera non-icteric, normal conjunctiva and lids ENMT: normal dentition, moist mucous membranes Cardiovascular: regular rhythm, bradycardic rate, no murmurs. S1 and S2 normal. Radial pulses normal bilaterally. No jugular venous distention.  Respiratory: clear to auscultation bilaterally GI : normal bowel sounds, soft and nontender. No distention.   MSK: extremities warm, well perfused. No edema.  NEURO: grossly nonfocal exam, moves all extremities. PSYCH: alert and oriented x 3, normal mood and affect.   ASSESSMENT:    1. History of MI (myocardial infarction)   2. Atherosclerosis of native coronary artery of native heart without angina pectoris   3. Pre-operative cardiovascular examination   4. Hyperlipidemia, unspecified hyperlipidemia type   5. SINUS BRADYCARDIA   6. OSA (obstructive sleep apnea)    PLAN:    History of MI (myocardial infarction) - Plan: EKG 12-Lead, ECHOCARDIOGRAM COMPLETE Atherosclerosis of native coronary  artery of native heart without angina pectoris Pre-operative cardiovascular examination - Plan: EKG 12-Lead, ECHOCARDIOGRAM COMPLETE -history of anterolateral STEMI with occluded first OM, s/p DES. Doing well afterward.  -continue Aspirin 81 mg daily throughout the perioperative period. Would recommend no interruption due to history of PCI.  - will obtain an echocardiogram for cardioavascular function prior to surgery. After echo, final preoperative comments can be made.   Hyperlipidemia, unspecified hyperlipidemia type - statin myalgias, statin intolerant - recommend PCSK9I, will arrange pharmacist visit.   SINUS BRADYCARDIA - asymptomatic. Not on beta blockade systemically. Takes timolol drops.   OSA (obstructive sleep apnea) -compliant with CPAP.   WILL ADDEND CURRENT NOTE AS NEEDED FOR FINAL PREOPERATIVE COMMENTS AFTER ECHOCARDIOGRAM COMPLETE.   Total time of encounter:  45 minutes total time of encounter, including 30 minutes spent in face-to-face patient care on the date of this encounter. This time includes coordination of care and counseling regarding above mentioned problem list. Remainder of non-face-to-face time involved reviewing chart documents/testing relevant to the patient encounter and documentation in the medical record. I have independently reviewed documentation from referring provider.   Cherlynn Kaiser, MD [place]  [facility]    Medication Adjustments/Labs and Tests Ordered: Current medicines are reviewed at length with the patient today.  Concerns regarding medicines are outlined above.   Orders Placed This Encounter  Procedures  . EKG 12-Lead  . ECHOCARDIOGRAM COMPLETE     No orders of the defined types were placed in this encounter.   Patient Instructions  Medication Instructions:  No Changes In Medications at this time.  *If you need a refill on your cardiac medications before your next appointment, please call your  pharmacy*  Testing/Procedures: Your physician has requested that you have an echocardiogram. Echocardiography is a painless test that uses sound waves to create images of your heart. It provides your doctor with information about the size and shape of your heart and how well your heart's chambers and valves are working. You may receive an ultrasound enhancing agent through an IV if needed to better visualize your heart during the echo.This procedure takes approximately one hour. There are no restrictions for this procedure. This will take place at the [address], [address].   Follow-Up: At [facility], you and your health needs are our priority.  As part of our continuing mission to provide you with exceptional heart care, we have created designated Provider Care Teams.  These Care Teams include your primary Cardiologist (physician) and Advanced Practice Providers (APPs -  Physician Assistants and Nurse Practitioners) who all work together to provide you with the care you need, when you need it.  Your next appointment:   6 month(s)  The format for your next appointment:   In Person  Provider:   Cherlynn Kaiser, MD  Other Instructions PLEASE MAKE AN APPOINTMENT WITH CVRR AT NEXT AVAILABLE TO DISCUSS POSSIBLE PCSK9.

## 2021-01-05 ENCOUNTER — Other Ambulatory Visit: Payer: Self-pay

## 2021-01-06 ENCOUNTER — Encounter: Payer: Self-pay | Admitting: Family Medicine

## 2021-01-06 ENCOUNTER — Ambulatory Visit (INDEPENDENT_AMBULATORY_CARE_PROVIDER_SITE_OTHER): Payer: Medicare Other | Admitting: Family Medicine

## 2021-01-06 VITALS — BP 124/78 | HR 59 | Ht 72.0 in | Wt 227.0 lb

## 2021-01-06 DIAGNOSIS — L719 Rosacea, unspecified: Secondary | ICD-10-CM

## 2021-01-06 MED ORDER — DOXYCYCLINE HYCLATE 100 MG PO TABS: 100.0000 mg | ORAL_TABLET | Freq: Every day | ORAL | 5 refills | Status: DC

## 2021-01-06 MED ORDER — METRONIDAZOLE 1 % EX GEL: Freq: Every day | CUTANEOUS | 5 refills | Status: DC

## 2021-01-06 NOTE — Patient Instructions (Signed)
Rosacea Rosacea is a long-term (chronic) condition that affects the skin of the face, including the cheeks, nose, forehead, and chin. This condition can also affect the eyes. Rosacea causes blood vessels near the surface of the skin to enlarge, which results in redness. What are the causes? The cause of this condition is not known. Certain triggers can make rosacea worse, including:  Hot baths.  Exercise.  Sunlight.  Very hot or cold temperatures.  Hot or spicy foods and drinks.  Drinking alcohol.  Stress.  Taking blood pressure medicine.  Long-term use of topical steroids on the face. What increases the risk? You are more likely to develop this condition if you:  Are older than 72 years of age.  Are a woman.  Have light-colored skin (light complexion).  Have a family history of rosacea. What are the signs or symptoms? Symptoms of this condition include:  Redness of the face.  Red bumps or pimples on the face.  A red, enlarged nose.  Blushing easily.  Red lines on the skin.  Irritated, burning, or itchy feeling in the eyes.  Swollen eyelids.  Drainage from the eyes.  Feeling like there is something in your eye.   How is this diagnosed? This condition is diagnosed with a medical history and physical exam. How is this treated? There is no cure for this condition, but treatment can help to control your symptoms. Your health care provider may recommend that you see a skin specialist (dermatologist). Treatment may include:  Medicines that are applied to the skin or taken by mouth (orally). This can include antibiotic medicines.  Laser treatment to improve the appearance of the skin.  Surgery. This is rare. Your health care provider will also recommend the best way to take care of your skin. Even after your skin improves, you will likely need to continue treatment to prevent your rosacea from coming back. Follow these instructions at home: Skin care Take  care of your skin as told by your health care provider. You may be told to do these things:  Wash your skin gently two or more times each day.  Use mild soap.  Use a sunscreen or sunblock with SPF 30 or greater.  Use gentle cosmetics that are meant for sensitive skin.  Shave with an electric shaver instead of a blade. Lifestyle  Try to keep track of what foods trigger this condition. Avoid any triggers. These may include: ? Spicy foods. ? Seafood. ? Cheese. ? Hot liquids. ? Nuts. ? Chocolate. ? Iodized salt.  Do not drink alcohol.  Avoid extremely cold or hot temperatures.  Try to reduce your stress. If you need help, talk with your health care provider.  When you exercise, do these things to stay cool: ? Limit sun exposure to your face. ? Use a fan. ? Do shorter and more frequent intervals of exercise. General instructions  Take and apply over-the-counter and prescription medicines only as told by your health care provider.  If you were prescribed an antibiotic medicine, apply it or take it as told by your health care provider. Do not stop using the antibiotic even if your condition improves.  If your eyelids are affected, apply warm compresses to them. Do this as told by your health care provider.  Keep all follow-up visits as told by your health care provider. This is important. Contact a health care provider if:  Your symptoms get worse.  Your symptoms do not improve after 2 months of treatment.  You  have new symptoms.  You have any changes in vision or you have problems with your eyes, such as redness or itching.  You feel depressed.  You lose your appetite.  You have trouble concentrating. Summary  Rosacea is a long-term (chronic) condition that affects the skin of the face, including the cheeks, nose, forehead, and chin.  Take care of your skin as told by your health care provider.  Take and apply over-the-counter and prescription medicines only as  told by your health care provider.  Contact a health care provider if your symptoms get worse or if you have any changes in vision or other problems with your eyes, such as redness or itching.  Keep all follow-up visits as told by your health care provider. This is important. This information is not intended to replace advice given to you by your health care provider. Make sure you discuss any questions you have with your health care provider. Document Revised: 04/26/2018 Document Reviewed: 04/26/2018 Elsevier Patient Education  2021 [employer].

## 2021-01-06 NOTE — Progress Notes (Signed)
Established Patient Office Visit  Subjective:  Patient ID: Matthew Pratt, male    DOB: August 31, 1951  Age: 70 y.o. MRN: [id number]  CC:  Chief Complaint  Patient presents with  . Skin Problem    HPI Matthew Pratt presents for discussion of rosacea.  He was diagnosed several years ago.  He notices sometimes after eating or while eating he has color changes in his face.  Others have commented on this.  He tries to avoid alcohol generally and spicy foods.  He is aware of dietary triggers.  He is also had some mild tissue thickening along the distal nose and is concerned about progression of that.  He has central face papules and pustules occasionally consistent with rosacea.  He sometimes notes mottled and bluish discoloration of the distal nose.  He tried MetroGel alone previously which did not seem to give much response.  He does not recall trying azelaic acid or oral medication such as doxycycline or minocycline.  Past Medical History:  Diagnosis Date  . Anxiety   . Arthritis   . BPH (benign prostatic hypertrophy)   . CAD (coronary artery disease)    s/p STEMI 10/15/10 w/ Promus DES placed x1 in the first OM  . Colon polyps   . Depression   . Depression with anxiety   . Dupuytren's contracture of hand   . ED (erectile dysfunction)   . GERD (gastroesophageal reflux disease)   . Glaucoma   . HLD (hyperlipidemia)   . Hyperthyroidism   . Hypocontractile bladder   . MI (myocardial infarction) ([place])   . OSA on CPAP   . Peyronie's disease   . Rosacea   . Skin cancer    BCC    Past Surgical History:  Procedure Laterality Date  . COLONOSCOPY WITH ESOPHAGOGASTRODUODENOSCOPY (EGD)     multiple  . CORONARY STENT PLACEMENT    . INNER EAR SURGERY    . LASIK  02/1998  . PENILE PROSTHESIS IMPLANT    . TONSILECTOMY, ADENOIDECTOMY, BILATERAL MYRINGOTOMY AND TUBES    . TONSILLECTOMY    . VASECTOMY    . WISDOM TOOTH EXTRACTION      Family History  Problem Relation Age of  Onset  . COPD Other   . COPD Mother   . Varicose Veins Father   . Liver cancer Father   . Basal cell carcinoma Father   . Basal cell carcinoma Sister   . Rheum arthritis Maternal Grandmother   . Heart failure Maternal Grandfather   . Colon cancer Paternal Grandmother   . Arthritis Paternal Grandfather   . Colon cancer Paternal Grandfather   . Melanoma Sister     Social History   Socioeconomic History  . Marital status: Divorced    Spouse name: Not on file  . Number of children: 2  . Years of education: Not on file  . Highest education level: Not on file  Occupational History  . Occupation: International aid/development worker    Comment: retired  Tobacco Use  . Smoking status: Never Smoker  . Smokeless tobacco: Never Used  Vaping Use  . Vaping Use: Never used  Substance and Sexual Activity  . Alcohol use: No  . Drug use: No  . Sexual activity: Not on file  Other Topics Concern  . Not on file  Social History Narrative   Divorced, 2 children   Retired Chief Financial Officer   No EtOH, tobacco, drugs   Social Determinants of Engineer, drilling  Resource Strain: Low Risk   . Difficulty of Paying Living Expenses: Not hard at all  Food Insecurity: Not on file  Transportation Needs: No Transportation Needs  . Lack of Transportation (Medical): No  . Lack of Transportation (Non-Medical): No  Physical Activity: Not on file  Stress: Not on file  Social Connections: Not on file  Intimate Partner Violence: Not on file    Outpatient Medications Prior to Visit  Medication Sig Dispense Refill  . aspirin EC 81 MG tablet Take 81 mg by mouth daily.     Marland Kitchen latanoprost (XALATAN) 0.005 % ophthalmic solution Place 1 drop into both eyes at bedtime.    Marland Kitchen omeprazole (PRILOSEC) 10 MG capsule TAKE 1 CAPSULE BY MOUTH EVERY DAY 90 capsule 3  . SYNTHROID 137 MCG tablet TAKE 1 TABLET EVERY DAY 90 tablet 1  . timolol (TIMOPTIC) 0.5 % ophthalmic solution Place 1 drop into both eyes 2 (two) times daily.    Marland Kitchen  venlafaxine XR (EFFEXOR-XR) 37.5 MG 24 hr capsule TAKE 3 CAPSULES BY MOUTH EVERY DAY 270 capsule 3   No facility-administered medications prior to visit.    Allergies  Allergen Reactions  . Crestor [Rosuvastatin]     Myalgia. Myalgia.   . Iodinated Diagnostic Agents Hives    Makes PT itchy  . Sulfa Antibiotics     REACTION: rash  . Sulfonamide Derivatives     REACTION: rash    ROS Review of Systems  Constitutional: Negative for chills and fever.      Objective:    Physical Exam Vitals reviewed.  Constitutional:      Appearance: Normal appearance.  Cardiovascular:     Rate and Rhythm: Normal rate and regular rhythm.  Pulmonary:     Effort: Pulmonary effort is normal.     Breath sounds: Normal breath sounds.  Skin:    Comments: Does have a couple of small papules around the nose and central face region but no pustules noted at this time.  Some scattered telangiectasias.  Neurological:     Mental Status: He is alert.     BP 124/78   Pulse (!) 59   Ht 6' (1.829 m)   Wt 227 lb (103 kg)   SpO2 96%   BMI 30.79 kg/m  Wt Readings from Last 3 Encounters:  01/06/21 227 lb (103 kg)  12/30/20 228 lb 3.2 oz (103.5 kg)  11/13/20 225 lb (102.1 kg)     There are no preventive care reminders to display for this patient.  There are no preventive care reminders to display for this patient.  Lab Results  Component Value Date   TSH 1.55 08/28/2019   Lab Results  Component Value Date   WBC 4.4 11/10/2020   HGB 14.6 11/10/2020   HCT 42.8 11/10/2020   MCV 101.7 (H) 11/10/2020   PLT 189.0 11/10/2020   Lab Results  Component Value Date   NA 137 11/10/2020   K 4.4 11/10/2020   CO2 29 11/10/2020   GLUCOSE 112 (H) 11/10/2020   BUN 15 11/10/2020   CREATININE 1.07 11/10/2020   BILITOT 0.9 11/13/2020   ALKPHOS 76 11/13/2020   AST 25 11/13/2020   ALT 77 (H) 11/13/2020   PROT 7.3 11/13/2020   ALBUMIN 4.3 11/13/2020   CALCIUM 9.4 11/10/2020   ANIONGAP 7 07/01/2020    GFR 69.70 11/10/2020   Lab Results  Component Value Date   CHOL 207 (H) 06/30/2020   Lab Results  Component Value Date   HDL  44 06/30/2020   Lab Results  Component Value Date   LDLCALC 145 (H) 06/30/2020   Lab Results  Component Value Date   TRIG 89 06/30/2020   Lab Results  Component Value Date   CHOLHDL 4.7 06/30/2020   Lab Results  Component Value Date   HGBA1C 5.6 06/29/2020      Assessment & Plan:   Problem List Items Addressed This Visit      Unprioritized   Rosacea - Primary    -We discussed potential food triggers and he is well aware of those and tries to avoid. -He has tried MetroGel 1% in the past which alone did not seem to work very well. -We did discuss considering trial of doxycycline 100 mg once daily to start with.  If he responds well we may be able to reduce dosage.  We recommend that he use concomitantly MetroGel 1% daily as well and hopefully if he responds well can get away with maintenance with topical only. -Could consider trial of topical azelaic acid if not responding to MetroGel 1%  Meds ordered this encounter  Medications  . doxycycline (VIBRA-TABS) 100 MG tablet    Sig: Take 1 tablet (100 mg total) by mouth daily.    Dispense:  30 tablet    Refill:  5  . metroNIDAZOLE (METROGEL) 1 % gel    Sig: Apply topically daily.    Dispense:  45 g    Refill:  5    Follow-up: No follow-ups on file.    [name], MD

## 2021-01-07 ENCOUNTER — Other Ambulatory Visit: Payer: Self-pay

## 2021-01-07 ENCOUNTER — Ambulatory Visit (INDEPENDENT_AMBULATORY_CARE_PROVIDER_SITE_OTHER): Payer: Medicare Other | Admitting: Pharmacist Clinician (PhC)/ Clinical Pharmacy Specialist

## 2021-01-07 ENCOUNTER — Other Ambulatory Visit: Payer: Self-pay | Admitting: Pharmacist Clinician (PhC)/ Clinical Pharmacy Specialist

## 2021-01-07 DIAGNOSIS — T466X5A Adverse effect of antihyperlipidemic and antiarteriosclerotic drugs, initial encounter: Secondary | ICD-10-CM

## 2021-01-07 DIAGNOSIS — E785 Hyperlipidemia, unspecified: Secondary | ICD-10-CM

## 2021-01-07 DIAGNOSIS — G720 Drug-induced myopathy: Secondary | ICD-10-CM

## 2021-01-07 LAB — LIPID PANEL
Chol/HDL Ratio: 5.3 ratio — ABNORMAL HIGH (ref 0.0–5.0)
Cholesterol, Total: 235 mg/dL — ABNORMAL HIGH (ref 100–199)
HDL: 44 mg/dL (ref >39)
LDL Chol Calc (NIH): 168 mg/dL — ABNORMAL HIGH (ref 0–99)
Triglycerides: 126 mg/dL (ref 0–149)
VLDL Cholesterol Cal: 23 mg/dL (ref 5–40)

## 2021-01-07 NOTE — Patient Instructions (Addendum)
Your Results:             Your most recent labs Goal  Total Cholesterol 207 <200  Triglycerides 89 < 150  HDL (happy/good cholesterol) 44 > 40  LDL (lousy/bad cholesterol 145 < 70      Medication changes:  We will start the approval process to get Repatha approved by your insurance company.  Once approved [facility] will call to let you know that it has been approved.  Lab orders:  Cholesterol labs today, then we will repeat again after 5-6 doses of Repatha.  We will mail you a lab order for that in about 2 months.   Patient Assistance:  The [employer] foundation offers assistance to help pay for medication copays.  They will cover copays for all cholesterol lowering meds, including statins, fibrates, omega-3 oils, ezetimibe, Repatha, Praluent, Nexletol, Nexlizet.  The cards are usually good for $2,500 or 12 months, whichever comes first. 1. Go to healthwellfoundation.org 2. Click on "Apply Now" 3. Answer questions as to whom is applying (patient or representative) 4. Your disease fund will be "hypercholesterolemia - Medicare access" 5. They will ask questions about finances and which medications you are taking for cholesterol 6. When you submit, the approval is usually within minutes.  You will need to print the card information from the site 7. You will need to show this information to your pharmacy, they will bill your Medicare Part D plan first -then bill [employer] --for the copay.   You can also call them at [phone], although the hold times can be quite long.   Thank you for choosing [facility]

## 2021-01-07 NOTE — Progress Notes (Signed)
01/08/2021 Matthew Pratt 04-29-1951 [id number]   HPI:  Matthew Pratt is a 70 y.o. male patient of Dr Margaretann Loveless, who presents today for a lipid clinic evaluation.  See pertinent past medical history below.  He originally presented to cardiology for pre-operative clearance for an elective cholecystectomy.  He had previously been seen in [place] by Dr. Agustin Cree, but that was at least 4 years ago.   During visit with Dr. Margaretann Loveless it was noted that his cholesterol labs were elevated.  He had previously been started on rosuvastatin, but unfortunately that caused an elevation in liver enzymes, with AST peaking at 840 and ALT at 913.  Past Medical History: ASCVD STEMI 2011 DES to OM1  OSA On CPAP  hyperthyroidism Synthroid 137    Current Medications: none  Cholesterol Goals: LDL < 70   Intolerant/previously tried:  Atorvastatin, rosuvastatin - myalgia, increase in LFTs to > 3x UNL  Family history: no family history of MI; cancer more of family history (dad at 57, mom died COPD/smoker), siblings mostly healthy (2 sisters), 2 sons don't know much  Diet: avoids fats (butters, margerines), decrecrease in salad dressings; eats plenty of salad, all proteins (avoids pork); avoids snacking, tries to stay away from ice cream  Exercise:  recumbant exercise bike 40-60 minutes/day 4/5 days per week; warmer weather walks 2.2 miles per day  Labs: 7/21:  TC 207, TG 89, HDL 44, LDL 145  2/22:  TC 235, TG 126, HDL 44, LDL 168   Current Outpatient Medications  Medication Sig Dispense Refill  . aspirin EC 81 MG tablet Take 81 mg by mouth daily.     Marland Kitchen b complex vitamins capsule Take 1 capsule by mouth daily.    . Cholecalciferol (VITAMIN D3) 50 MCG (2000 UT) capsule Take 2,000 Units by mouth daily.    Marland Kitchen doxycycline (VIBRA-TABS) 100 MG tablet Take 1 tablet (100 mg total) by mouth daily. 30 tablet 5  . latanoprost (XALATAN) 0.005 % ophthalmic solution Place 1 drop into both eyes at bedtime.    .  metroNIDAZOLE (METROGEL) 1 % gel Apply topically daily. 45 g 5  . omeprazole (PRILOSEC) 10 MG capsule TAKE 1 CAPSULE BY MOUTH EVERY DAY 90 capsule 3  . SYNTHROID 137 MCG tablet TAKE 1 TABLET EVERY DAY 90 tablet 1  . timolol (TIMOPTIC) 0.5 % ophthalmic solution Place 1 drop into both eyes 2 (two) times daily.    . TURMERIC PO Take 1 capsule by mouth daily.    Marland Kitchen venlafaxine XR (EFFEXOR-XR) 37.5 MG 24 hr capsule TAKE 3 CAPSULES BY MOUTH EVERY DAY 270 capsule 3  . zinc gluconate 50 MG tablet Take 50 mg by mouth daily.     No current facility-administered medications for this visit.    Allergies  Allergen Reactions  . Crestor [Rosuvastatin]     Myalgia. Myalgia.   . Iodinated Diagnostic Agents Hives    Makes PT itchy  . Sulfa Antibiotics     REACTION: rash  . Sulfonamide Derivatives     REACTION: rash    Past Medical History:  Diagnosis Date  . Anxiety   . Arthritis   . BPH (benign prostatic hypertrophy)   . CAD (coronary artery disease)    s/p STEMI 10/15/10 w/ Promus DES placed x1 in the first OM  . Colon polyps   . Depression   . Depression with anxiety   . Dupuytren's contracture of hand   . ED (erectile dysfunction)   . GERD (gastroesophageal reflux disease)   .  Glaucoma   . HLD (hyperlipidemia)   . Hyperthyroidism   . Hypocontractile bladder   . MI (myocardial infarction) ([place])   . OSA on CPAP   . Peyronie's disease   . Rosacea   . Skin cancer    BCC    There were no vitals taken for this visit.   Hyperlipidemia Patient with hyperlipidemia, ASCVD and unable to tolerate statin drugs due to elevated liver enzymes greater than 3 times UNL.  MD not willing to re-challenge with any other due to this.  He also has a history of elevated CK in the past.  Reviewed options for lowering LDL cholesterol, including ezetimibe, PCSK-9 inhibitors, bempedoic acid and inclisiran.  Discussed mechanisms of action, dosing, side effects and potential decreases in LDL cholesterol.   Answered all patient questions.  Based on this information, patient would prefer to start Repatha 400 mg SureClick.  We will repeat labs today and submit information to his insurance for approval.  Repeat labs after 5-6 doses.     Tommy Medal PharmD CPP [facility] [address] [place], [place] [address] [phone]

## 2021-01-08 ENCOUNTER — Telehealth: Payer: Self-pay

## 2021-01-08 ENCOUNTER — Encounter: Payer: Self-pay | Admitting: Pharmacist Clinician (PhC)/ Clinical Pharmacy Specialist

## 2021-01-08 DIAGNOSIS — E785 Hyperlipidemia, unspecified: Secondary | ICD-10-CM

## 2021-01-08 MED ORDER — REPATHA SURECLICK 140 MG/ML ~~LOC~~ SOAJ: 140.0000 mg | SUBCUTANEOUS | 11 refills | Status: DC

## 2021-01-08 NOTE — Assessment & Plan Note (Addendum)
Patient with hyperlipidemia, ASCVD and unable to tolerate statin drugs due to elevated liver enzymes greater than 3 times UNL.  MD not willing to re-challenge with any other due to this.  He also has a history of elevated CK in the past.  Reviewed options for lowering LDL cholesterol, including ezetimibe, PCSK-9 inhibitors, bempedoic acid and inclisiran.  Discussed mechanisms of action, dosing, side effects and potential decreases in LDL cholesterol.  Answered all patient questions.  Based on this information, patient would prefer to start Repatha 937 mg SureClick.  We will repeat labs today and submit information to his insurance for approval.  Repeat labs after 5-6 doses.

## 2021-01-08 NOTE — Telephone Encounter (Signed)
Called and spoke w/pt regarding the approval of prior authorization for the repatha. Rx sent, pt instructed to come post 4th dose for fasting labs. Pt voiced understanding

## 2021-01-20 ENCOUNTER — Other Ambulatory Visit: Payer: Self-pay

## 2021-01-20 ENCOUNTER — Ambulatory Visit (HOSPITAL_COMMUNITY): Payer: Medicare Other | Attending: Cardiovascular Disease

## 2021-01-20 DIAGNOSIS — I252 Old myocardial infarction: Secondary | ICD-10-CM

## 2021-01-20 DIAGNOSIS — Z01810 Encounter for preprocedural cardiovascular examination: Secondary | ICD-10-CM | POA: Insufficient documentation

## 2021-01-20 LAB — ECHOCARDIOGRAM COMPLETE
Area-P 1/2: 2.56 cm2
P 1/2 time: 594 msec
S' Lateral: 3.1 cm

## 2021-02-06 ENCOUNTER — Other Ambulatory Visit (HOSPITAL_COMMUNITY)
Admission: RE | Admit: 2021-02-06 | Discharge: 2021-02-06 | Disposition: A | Discharge status: Home / Self Care | Payer: Medicare Other | Source: Ambulatory Visit | Attending: Surgery | Admitting: Surgery

## 2021-02-06 DIAGNOSIS — Z20822 Contact with and (suspected) exposure to covid-19: Secondary | ICD-10-CM | POA: Insufficient documentation

## 2021-02-06 DIAGNOSIS — Z01812 Encounter for preprocedural laboratory examination: Secondary | ICD-10-CM | POA: Insufficient documentation

## 2021-02-06 LAB — SARS CORONAVIRUS 2 (TAT 6-24 HRS): SARS Coronavirus 2: NEGATIVE

## 2021-02-08 ENCOUNTER — Encounter (HOSPITAL_COMMUNITY): Payer: Self-pay | Admitting: Surgery

## 2021-02-08 NOTE — Progress Notes (Signed)
Pre-op call completed. Pt. Reported no one had informed him of stopping the aspirin. Pt. Had already taken it today. Instructed pt. To call Dr. Trevor Mace office tomorrow morning for further instructions.  Reminded pt. To continue to quarantine . Pt. reprorted during 2015 penile surgery @ [facility], he received some anesthesia and started having chest pain. Stated they readjusted the medication, pain stopped and continued to take him to O.R.  Reported he had no problems in PACU and did not have to follow up with anyone.

## 2021-02-09 HISTORY — PX: LAPAROSCOPIC CHOLECYSTECTOMY: SUR755

## 2021-02-09 NOTE — Progress Notes (Signed)
Anesthesia Chart Review: Same day workup  Follows with cardiology for history of STEMI 10/15/10 with DES placed x 1 in OM1, HLD, OSA on CPAP.  Seen 12/30/2020 for preop evaluation.  Recommended obtaining preop echocardiogram to assess cardiovascular function.  Echo done 01/20/2021 showed EF 55 to 60%, normal wall motion, no significant valvular abnormalities.  Will need day of surgery labs and evaluation.  EKG 12/30/2020: Sinus bradycardia.  Rate 48.  TTE 01/20/2021: 1. Left ventricular ejection fraction, by estimation, is 55 to 60%. The  left ventricle has normal function. The left ventricle has no regional  wall motion abnormalities. There is mild left ventricular hypertrophy.  Left ventricular diastolic parameters  are indeterminate.  2. Right ventricular systolic function is normal. The right ventricular  size is normal.  3. The mitral valve is normal in structure. Trivial mitral valve  regurgitation. No evidence of mitral stenosis.  4. The aortic valve is normal in structure. Aortic valve regurgitation is  mild. No aortic stenosis is present.    Wynonia Musty [facility] Short Stay Center/Anesthesiology Phone [phone] 02/09/2021 2:22 PM

## 2021-02-09 NOTE — H&P (Signed)
Matthew Pratt  Location: [facility] : [facility] Patient #: [id number] DOB: 01-11-1951 Single / Language: [name] / Race: White Male   History of Present Illness  The patient is a 70 year old male who presents with abdominal pain.  Chief complaint: Right upper quadrant abdominal pain  This is a 70 year old gentleman who I saw in July of last year. At that time he was in the hospital for possible gallbladder problems. He underwent an ultrasound and CT scan was suggested possible acalculus cholecystitis. His HIDA scan at that time was unremarkable. He continues to have recurrent right upper quadrant abdominal pain with meals. He underwent MRCP which showed dilation of the bile duct but no other abnormalities. He has since had another MR CP showed some improvement in previous of all thickening and a decrease in size of the common bile duct. He has had intermittent elevated liver function tests. He again had another ultrasound in December which did not show gallstones. He is followed closely by Dr. Carlean Purl from gastroenterology. A cholecystectomy is been recommended. He has a history of having had a previous heart stent placed several years ago. He is not on any blood thinning medications. He currently is doing well. He does have a history of sleep apnea and is on CPAP   Past Surgical History  Oral Surgery  Tonsillectomy  Vasectomy   Diagnostic Studies History  Colonoscopy  within last year  Allergies (Kheana Marshall-McBride, CMA;  Sulfa Drugs  Crestor *ANTIHYPERLIPIDEMICS*  Iodinated Glycerol *COUGH/COLD/ALLERGY*  Allergies Reconciled   Medication History (Kheana Marshall-McBride, CMA PM) Latanoprost (0.005% Solution, Ophthalmic) Active. Omeprazole (10MG  Capsule DR, Oral) Active. Synthroid (137MCG Tablet, Oral) Active. Timolol Maleate (0.5% Solution, Ophthalmic) Active. Venlafaxine HCl ER (37.5MG  Capsule ER 24HR, Oral) Active. Medications  Reconciled  Social History Sherron Ales Marshall-McBride, CMA;  Alcohol use  Occasional alcohol use. Caffeine use  Coffee, Tea. No drug use  Tobacco use  Never smoker.  Family History Sherron Ales Marshall-McBride, CMA;  Arthritis  Father, Sister. Colon Polyps  Father, Sister. Depression  Sister. Melanoma  Father. Thyroid problems  Mother.  Other Problems Sherron Ales Marshall-McBride, CMA;  Anxiety Disorder  Back Pain  Cholelithiasis  Congestive Heart Failure  Depression  Enlarged Prostate  Gastroesophageal Reflux Disease  Hemorrhoids  Hypercholesterolemia  Myocardial infarction  Sleep Apnea  Thyroid Disease     Review of Systems   General Present- Weight Gain. Not Present- Appetite Loss, Chills, Fatigue, Fever, Night Sweats and Weight Loss. Skin Not Present- Change in Wart/Mole, Dryness, Hives, Jaundice, New Lesions, Non-Healing Wounds, Rash and Ulcer. HEENT Not Present- Earache, Hearing Loss, Hoarseness, Nose Bleed, Oral Ulcers, Ringing in the Ears, Seasonal Allergies, Sinus Pain, Sore Throat, Visual Disturbances, Wears glasses/contact lenses and Yellow Eyes. Respiratory Not Present- Bloody sputum, Chronic Cough, Difficulty Breathing, Snoring and Wheezing. Breast Not Present- Breast Mass, Breast Pain, Nipple Discharge and Skin Changes. Cardiovascular Not Present- Chest Pain, Difficulty Breathing Lying Down, Leg Cramps, Palpitations, Rapid Heart Rate, Shortness of Breath and Swelling of Extremities. Gastrointestinal Present- Abdominal Pain, Change in Bowel Habits, Excessive gas and Nausea. Not Present- Bloating, Bloody Stool, Chronic diarrhea, Constipation, Difficulty Swallowing, Gets full quickly at meals, Hemorrhoids, Indigestion, Rectal Pain and Vomiting. Male Genitourinary Not Present- Blood in Urine, Change in Urinary Stream, Frequency, Impotence, Nocturia, Painful Urination, Urgency and Urine Leakage. Musculoskeletal Present- Back Pain. Not Present- Joint Pain,  Joint Stiffness, Muscle Pain, Muscle Weakness and Swelling of Extremities. Neurological Not Present- Decreased Memory, Fainting, Headaches, Numbness, Seizures, Tingling, Tremor, Trouble walking and Weakness.  Psychiatric Not Present- Anxiety, Bipolar, Change in Sleep Pattern, Depression, Fearful and Frequent crying. Hematology Not Present- Blood Thinners, Easy Bruising, Excessive bleeding, Gland problems, HIV and Persistent Infections.  Vitals   Weight: 227.25 lb Height: 67in Body Surface Area: 2.13 m Body Mass Index: 35.59 kg/m  Temp.: 97.76F (Oral)  Pulse: 81 (Regular)  P.OX: 96% (Room air) BP: 136/82(Sitting, Left Arm, Standard)     Physical Exam  The physical exam findings are as follows: Note: Today, he appears well on exam  His abdomen is soft nontender.  There is no hepatomegaly    Assessment & Plan   RECURRENT BILIARY COLIC (W10.27)  Impression: I have extensively reviewed his notes in the electronic medical records including his multiple ultrasounds, MRIs, and CT scans. 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 Again, he has been having continuous symptoms with every meal now since July 2021. I agree with gastroenterology and recommend proceeding with a laparoscopic cholecystectomy and cholangiogram. This would allow histologic evaluation of the gallbladder as well as evaluation of the bile duct. I discussed the risk which includes but is not limited to bleeding, infection, injury to surrounding structures, the need for further procedures, the need to admit him to the hospital postoperatively if stones are found at about duct or because of his sleep apnea, cardiopulmonary issues, etc. He understands and wishes to proceed with surgery since possible. Given his prior history of a heart stent we will get cardiac clearance and surgery will be scheduled urgently.

## 2021-02-09 NOTE — Anesthesia Preprocedure Evaluation (Addendum)
Anesthesia Evaluation  Patient identified by MRN, date of birth, ID band Patient awake    Reviewed: Allergy & Precautions, NPO status , Patient's Chart, lab work & pertinent test results  History of Anesthesia Complications Negative for: history of anesthetic complications  Airway Mallampati: II  TM Distance: >3 FB Neck ROM: Full    Dental  (+)    Pulmonary sleep apnea ,    Pulmonary exam normal        Cardiovascular + CAD, + Past MI and + Cardiac Stents (2011)  Normal cardiovascular exam  TTE 01/2021: EF 55-60%, mild LVH, mild AR    Neuro/Psych Anxiety Depression negative neurological ROS     GI/Hepatic Neg liver ROS, GERD  Controlled and Medicated,Biliary colic   Endo/Other  Hypothyroidism   Renal/GU negative Renal ROS  negative genitourinary   Musculoskeletal  (+) Arthritis ,   Abdominal   Peds  Hematology negative hematology ROS (+)   Anesthesia Other Findings Day of surgery medications reviewed with patient.  Reproductive/Obstetrics negative OB ROS                            Anesthesia Physical Anesthesia Plan  ASA: III  Anesthesia Plan: General   Post-op Pain Management:    Induction: Intravenous  PONV Risk Score and Plan: 3 and Treatment may vary due to age or medical condition, Ondansetron, Dexamethasone and Midazolam  Airway Management Planned: Oral ETT  Additional Equipment: None  Intra-op Plan:   Post-operative Plan: Extubation in OR  Informed Consent: I have reviewed the patients History and Physical, chart, labs and discussed the procedure including the risks, benefits and alternatives for the proposed anesthesia with the patient or authorized representative who has indicated his/her understanding and acceptance.     Dental advisory given  Plan Discussed with: CRNA  Anesthesia Plan Comments: (PAT note by Karoline Caldwell, PA-C: Follows with cardiology for  history of STEMI 10/15/10 with DES placed x 1 in OM1, HLD, OSA on CPAP.  Seen 12/30/2020 for preop evaluation.  Recommended obtaining preop echocardiogram to assess cardiovascular function.  Echo done 01/20/2021 showed EF 55 to 60%, normal wall motion, no significant valvular abnormalities.  Will need day of surgery labs and evaluation.  EKG 12/30/2020: Sinus bradycardia.  Rate 48.  TTE 01/20/2021: 1. Left ventricular ejection fraction, by estimation, is 55 to 60%. The  left ventricle has normal function. The left ventricle has no regional  wall motion abnormalities. There is mild left ventricular hypertrophy.  Left ventricular diastolic parameters  are indeterminate.  2. Right ventricular systolic function is normal. The right ventricular  size is normal.  3. The mitral valve is normal in structure. Trivial mitral valve  regurgitation. No evidence of mitral stenosis.  4. The aortic valve is normal in structure. Aortic valve regurgitation is  mild. No aortic stenosis is present.   )      Anesthesia Quick Evaluation

## 2021-02-10 ENCOUNTER — Encounter (HOSPITAL_COMMUNITY)
Admission: RE | Disposition: A | Discharge status: Home / Self Care | Payer: Self-pay | Source: Home / Self Care | Attending: Surgery

## 2021-02-10 ENCOUNTER — Ambulatory Visit (HOSPITAL_COMMUNITY): Payer: Medicare Other | Admitting: Physician Assistant

## 2021-02-10 ENCOUNTER — Observation Stay (HOSPITAL_COMMUNITY)
Admission: RE | Admit: 2021-02-10 | Discharge: 2021-02-11 | Disposition: A | Discharge status: Home / Self Care | Payer: Medicare Other | Attending: Surgery | Admitting: Surgery

## 2021-02-10 ENCOUNTER — Other Ambulatory Visit: Payer: Self-pay

## 2021-02-10 ENCOUNTER — Encounter (HOSPITAL_COMMUNITY): Payer: Self-pay | Admitting: Surgery

## 2021-02-10 DIAGNOSIS — K8050 Calculus of bile duct without cholangitis or cholecystitis without obstruction: Principal | ICD-10-CM | POA: Insufficient documentation

## 2021-02-10 DIAGNOSIS — G4733 Obstructive sleep apnea (adult) (pediatric): Secondary | ICD-10-CM | POA: Diagnosis not present

## 2021-02-10 DIAGNOSIS — I509 Heart failure, unspecified: Secondary | ICD-10-CM | POA: Diagnosis not present

## 2021-02-10 DIAGNOSIS — Z79899 Other long term (current) drug therapy: Secondary | ICD-10-CM | POA: Insufficient documentation

## 2021-02-10 DIAGNOSIS — E039 Hypothyroidism, unspecified: Secondary | ICD-10-CM | POA: Diagnosis not present

## 2021-02-10 DIAGNOSIS — Z9049 Acquired absence of other specified parts of digestive tract: Secondary | ICD-10-CM

## 2021-02-10 DIAGNOSIS — K811 Chronic cholecystitis: Secondary | ICD-10-CM | POA: Diagnosis not present

## 2021-02-10 DIAGNOSIS — R1011 Right upper quadrant pain: Secondary | ICD-10-CM | POA: Diagnosis present

## 2021-02-10 DIAGNOSIS — Z9989 Dependence on other enabling machines and devices: Secondary | ICD-10-CM | POA: Diagnosis not present

## 2021-02-10 HISTORY — DX: Hypothyroidism, unspecified: E03.9

## 2021-02-10 HISTORY — PX: CHOLECYSTECTOMY: SHX55

## 2021-02-10 HISTORY — DX: Other complications of anesthesia, initial encounter: T88.59XA

## 2021-02-10 LAB — CBC
HCT: 40.4 % (ref 39.0–52.0)
Hemoglobin: 14 g/dL (ref 13.0–17.0)
MCH: 35 pg — ABNORMAL HIGH (ref 26.0–34.0)
MCHC: 34.7 g/dL (ref 30.0–36.0)
MCV: 101 fL — ABNORMAL HIGH (ref 80.0–100.0)
Platelets: 203 10*3/uL (ref 150–400)
RBC: 4 MIL/uL — ABNORMAL LOW (ref 4.22–5.81)
RDW: 11.9 % (ref 11.5–15.5)
WBC: 4.3 10*3/uL (ref 4.0–10.5)
nRBC: 0 % (ref 0.0–0.2)

## 2021-02-10 LAB — BASIC METABOLIC PANEL
Anion gap: 7 (ref 5–15)
BUN: 17 mg/dL (ref 8–23)
CO2: 26 mmol/L (ref 22–32)
Calcium: 8.9 mg/dL (ref 8.9–10.3)
Chloride: 105 mmol/L (ref 98–111)
Creatinine, Ser: 1.12 mg/dL (ref 0.61–1.24)
GFR, Estimated: >60 mL/min (ref >60)
Glucose, Bld: 102 mg/dL — ABNORMAL HIGH (ref 70–99)
Potassium: 4.2 mmol/L (ref 3.5–5.1)
Sodium: 138 mmol/L (ref 135–145)

## 2021-02-10 SURGERY — Surgical Case
Anesthesia: *Unknown

## 2021-02-10 SURGERY — LAPAROSCOPIC CHOLECYSTECTOMY
Anesthesia: General | Site: Abdomen

## 2021-02-10 MED ORDER — ONDANSETRON HCL 4 MG/2ML IJ SOLN: 4.0000 mg | Freq: Four times a day (QID) | INTRAMUSCULAR | Status: DC | PRN

## 2021-02-10 MED ORDER — LIDOCAINE 2% (20 MG/ML) 5 ML SYRINGE
INTRAMUSCULAR | Status: AC
  Filled 2021-02-10: qty 5

## 2021-02-10 MED ORDER — ENSURE PRE-SURGERY PO LIQD: 296.0000 mL | Freq: Once | ORAL | Status: DC

## 2021-02-10 MED ORDER — ROCURONIUM BROMIDE 10 MG/ML (PF) SYRINGE
PREFILLED_SYRINGE | INTRAVENOUS | Status: DC | PRN
  Administered 2021-02-10: 20 mg via INTRAVENOUS
  Administered 2021-02-10: 60 mg via INTRAVENOUS

## 2021-02-10 MED ORDER — CEFAZOLIN SODIUM-DEXTROSE 2-4 GM/100ML-% IV SOLN
2.0000 g | INTRAVENOUS | Status: DC
  Filled 2021-02-10: qty 100

## 2021-02-10 MED ORDER — CEFAZOLIN SODIUM-DEXTROSE 2-3 GM-%(50ML) IV SOLR
INTRAVENOUS | Status: DC | PRN
  Administered 2021-02-10: 2 g via INTRAVENOUS

## 2021-02-10 MED ORDER — CHLORHEXIDINE GLUCONATE CLOTH 2 % EX PADS: 6.0000 | MEDICATED_PAD | Freq: Once | CUTANEOUS | Status: DC

## 2021-02-10 MED ORDER — SODIUM CHLORIDE 0.9 % IV SOLN
INTRAVENOUS | Status: DC | PRN
  Administered 2021-02-10: .1 mL

## 2021-02-10 MED ORDER — B COMPLEX-C PO TABS
1.0000 | ORAL_TABLET | Freq: Every day | ORAL | Status: DC
  Administered 2021-02-10: 1 via ORAL
  Filled 2021-02-10 (×2): qty 1

## 2021-02-10 MED ORDER — ZINC GLUCONATE 50 MG PO TABS: 50.0000 mg | ORAL_TABLET | Freq: Every day | ORAL | Status: DC

## 2021-02-10 MED ORDER — LATANOPROST 0.005 % OP SOLN
1.0000 [drp] | Freq: Every day | OPHTHALMIC | Status: DC
  Filled 2021-02-10: qty 2.5

## 2021-02-10 MED ORDER — BUPIVACAINE HCL (PF) 0.25 % IJ SOLN
INTRAMUSCULAR | Status: DC | PRN
  Administered 2021-02-10: 20 mL

## 2021-02-10 MED ORDER — ASPIRIN EC 81 MG PO TBEC
81.0000 mg | DELAYED_RELEASE_TABLET | Freq: Every day | ORAL | Status: DC
  Administered 2021-02-10 – 2021-02-11 (×2): 81 mg via ORAL
  Filled 2021-02-10 (×2): qty 1

## 2021-02-10 MED ORDER — OXYCODONE HCL 5 MG PO TABS
5.0000 mg | ORAL_TABLET | Freq: Once | ORAL | Status: DC | PRN
Start: 2021-02-10 — End: 2021-02-10

## 2021-02-10 MED ORDER — SUGAMMADEX SODIUM 200 MG/2ML IV SOLN
INTRAVENOUS | Status: DC | PRN
  Administered 2021-02-10: 240 mg via INTRAVENOUS

## 2021-02-10 MED ORDER — ENOXAPARIN SODIUM 40 MG/0.4ML ~~LOC~~ SOLN
40.0000 mg | SUBCUTANEOUS | Status: DC
  Filled 2021-02-10: qty 0.4

## 2021-02-10 MED ORDER — MIDAZOLAM HCL 2 MG/2ML IJ SOLN
INTRAMUSCULAR | Status: DC | PRN
  Administered 2021-02-10: 2 mg via INTRAVENOUS

## 2021-02-10 MED ORDER — ZINC SULFATE 220 (50 ZN) MG PO CAPS
220.0000 mg | ORAL_CAPSULE | Freq: Every day | ORAL | Status: DC
  Administered 2021-02-10: 220 mg via ORAL
  Filled 2021-02-10: qty 1

## 2021-02-10 MED ORDER — TRAMADOL HCL 50 MG PO TABS
50.0000 mg | ORAL_TABLET | Freq: Four times a day (QID) | ORAL | Status: DC | PRN
Start: 2021-02-10 — End: 2021-02-11

## 2021-02-10 MED ORDER — MENTHOL 3 MG MT LOZG
1.0000 | LOZENGE | OROMUCOSAL | Status: DC | PRN
  Administered 2021-02-10: 3 mg via ORAL
  Filled 2021-02-10: qty 9

## 2021-02-10 MED ORDER — PROPOFOL 10 MG/ML IV BOLUS
INTRAVENOUS | Status: AC
  Filled 2021-02-10: qty 20

## 2021-02-10 MED ORDER — OXYCODONE HCL 5 MG/5ML PO SOLN: 5.0000 mg | Freq: Once | ORAL | Status: DC | PRN

## 2021-02-10 MED ORDER — PROMETHAZINE HCL 25 MG/ML IJ SOLN
6.2500 mg | INTRAMUSCULAR | Status: DC | PRN
Start: 2021-02-10 — End: 2021-02-10

## 2021-02-10 MED ORDER — ONDANSETRON HCL 4 MG/2ML IJ SOLN
INTRAMUSCULAR | Status: AC
  Filled 2021-02-10: qty 2

## 2021-02-10 MED ORDER — PROPOFOL 10 MG/ML IV BOLUS
INTRAVENOUS | Status: DC | PRN
  Administered 2021-02-10: 180 mg via INTRAVENOUS

## 2021-02-10 MED ORDER — LEVOTHYROXINE SODIUM 25 MCG PO TABS
137.0000 ug | ORAL_TABLET | Freq: Every day | ORAL | Status: DC
  Administered 2021-02-11: 137 ug via ORAL
  Filled 2021-02-10: qty 1

## 2021-02-10 MED ORDER — PHENYLEPHRINE 40 MCG/ML (10ML) SYRINGE FOR IV PUSH (FOR BLOOD PRESSURE SUPPORT)
PREFILLED_SYRINGE | INTRAVENOUS | Status: DC | PRN
  Administered 2021-02-10: 120 ug via INTRAVENOUS

## 2021-02-10 MED ORDER — ONDANSETRON 4 MG PO TBDP: 4.0000 mg | ORAL_TABLET | Freq: Four times a day (QID) | ORAL | Status: DC | PRN

## 2021-02-10 MED ORDER — MIDAZOLAM HCL 2 MG/2ML IJ SOLN
INTRAMUSCULAR | Status: AC
  Filled 2021-02-10: qty 2

## 2021-02-10 MED ORDER — OXYCODONE HCL 5 MG PO TABS
5.0000 mg | ORAL_TABLET | ORAL | Status: DC | PRN
  Administered 2021-02-10: 10 mg via ORAL
  Administered 2021-02-10: 5 mg via ORAL
  Filled 2021-02-10: qty 1
  Filled 2021-02-10 (×2): qty 2

## 2021-02-10 MED ORDER — EPHEDRINE SULFATE 50 MG/ML IJ SOLN
INTRAMUSCULAR | Status: DC | PRN
  Administered 2021-02-10: 15 mg via INTRAVENOUS
  Administered 2021-02-10: 10 mg via INTRAVENOUS

## 2021-02-10 MED ORDER — 0.9 % SODIUM CHLORIDE (POUR BTL) OPTIME
TOPICAL | Status: DC | PRN
  Administered 2021-02-10: 1000 mL

## 2021-02-10 MED ORDER — LIDOCAINE 2% (20 MG/ML) 5 ML SYRINGE: INTRAMUSCULAR | Status: DC | PRN

## 2021-02-10 MED ORDER — CHLORHEXIDINE GLUCONATE 0.12 % MT SOLN
15.0000 mL | Freq: Once | OROMUCOSAL | Status: AC
  Administered 2021-02-10: 15 mL via OROMUCOSAL
  Filled 2021-02-10: qty 15

## 2021-02-10 MED ORDER — GLYCOPYRROLATE PF 0.2 MG/ML IJ SOSY
PREFILLED_SYRINGE | INTRAMUSCULAR | Status: DC | PRN
  Administered 2021-02-10: .2 mg via INTRAVENOUS

## 2021-02-10 MED ORDER — POTASSIUM CHLORIDE IN NACL 20-0.9 MEQ/L-% IV SOLN
INTRAVENOUS | Status: DC
  Filled 2021-02-10: qty 1000

## 2021-02-10 MED ORDER — VENLAFAXINE HCL ER 75 MG PO CP24
112.5000 mg | ORAL_CAPSULE | Freq: Every evening | ORAL | Status: DC
  Administered 2021-02-10: 112.5 mg via ORAL
  Filled 2021-02-10 (×2): qty 1

## 2021-02-10 MED ORDER — SODIUM CHLORIDE 0.9 % IR SOLN
Status: DC | PRN
  Administered 2021-02-10: 1000 mL

## 2021-02-10 MED ORDER — DEXAMETHASONE SODIUM PHOSPHATE 10 MG/ML IJ SOLN
INTRAMUSCULAR | Status: DC | PRN
  Administered 2021-02-10: 10 mg via INTRAVENOUS

## 2021-02-10 MED ORDER — TIMOLOL MALEATE 0.5 % OP SOLN
1.0000 [drp] | Freq: Two times a day (BID) | OPHTHALMIC | Status: DC
  Filled 2021-02-10 (×2): qty 5

## 2021-02-10 MED ORDER — VITAMIN D3 25 MCG (1000 UNIT) PO TABS
2000.0000 [IU] | ORAL_TABLET | Freq: Every day | ORAL | Status: DC
  Administered 2021-02-10: 2000 [IU] via ORAL
  Filled 2021-02-10 (×3): qty 2

## 2021-02-10 MED ORDER — LIDOCAINE 2% (20 MG/ML) 5 ML SYRINGE
INTRAMUSCULAR | Status: DC | PRN
  Administered 2021-02-10: 100 mg via INTRAVENOUS

## 2021-02-10 MED ORDER — FENTANYL CITRATE (PF) 250 MCG/5ML IJ SOLN
INTRAMUSCULAR | Status: AC
  Filled 2021-02-10: qty 5

## 2021-02-10 MED ORDER — FENTANYL CITRATE (PF) 100 MCG/2ML IJ SOLN: 25.0000 ug | INTRAMUSCULAR | Status: DC | PRN

## 2021-02-10 MED ORDER — MORPHINE SULFATE (PF) 2 MG/ML IV SOLN: 1.0000 mg | INTRAVENOUS | Status: DC | PRN

## 2021-02-10 MED ORDER — ACETAMINOPHEN 500 MG PO TABS
1000.0000 mg | ORAL_TABLET | ORAL | Status: AC
  Administered 2021-02-10: 1000 mg via ORAL
  Filled 2021-02-10: qty 2

## 2021-02-10 MED ORDER — BUPIVACAINE HCL (PF) 0.25 % IJ SOLN
INTRAMUSCULAR | Status: AC
  Filled 2021-02-10: qty 30

## 2021-02-10 MED ORDER — FENTANYL CITRATE (PF) 250 MCG/5ML IJ SOLN
INTRAMUSCULAR | Status: DC | PRN
  Administered 2021-02-10: 100 ug via INTRAVENOUS
  Administered 2021-02-10 (×2): 50 ug via INTRAVENOUS

## 2021-02-10 MED ORDER — DEXAMETHASONE SODIUM PHOSPHATE 10 MG/ML IJ SOLN
INTRAMUSCULAR | Status: AC
  Filled 2021-02-10: qty 1

## 2021-02-10 MED ORDER — LACTATED RINGERS IV SOLN: INTRAVENOUS | Status: DC

## 2021-02-10 MED ORDER — ONDANSETRON HCL 4 MG/2ML IJ SOLN
INTRAMUSCULAR | Status: DC | PRN
  Administered 2021-02-10: 4 mg via INTRAVENOUS

## 2021-02-10 MED ORDER — ORAL CARE MOUTH RINSE: 15.0000 mL | Freq: Once | OROMUCOSAL | Status: AC

## 2021-02-10 MED ORDER — LACTATED RINGERS IV SOLN: INTRAVENOUS | Status: DC | PRN

## 2021-02-10 MED ORDER — DOXYCYCLINE HYCLATE 100 MG PO TABS
100.0000 mg | ORAL_TABLET | Freq: Every day | ORAL | Status: DC
  Administered 2021-02-10 – 2021-02-11 (×2): 100 mg via ORAL
  Filled 2021-02-10 (×2): qty 1

## 2021-02-10 MED ORDER — PANTOPRAZOLE SODIUM 40 MG PO TBEC
40.0000 mg | DELAYED_RELEASE_TABLET | Freq: Every day | ORAL | Status: DC
  Administered 2021-02-10 – 2021-02-11 (×2): 40 mg via ORAL
  Filled 2021-02-10 (×2): qty 1

## 2021-02-10 MED ORDER — B COMPLEX VITAMINS PO CAPS: 1.0000 | ORAL_CAPSULE | Freq: Every day | ORAL | Status: DC

## 2021-02-10 SURGICAL SUPPLY — 36 items
APPLIER CLIP 5 13 M/L LIGAMAX5 (MISCELLANEOUS) ×3
BLADE CLIPPER SURG (BLADE) IMPLANT
CANISTER SUCT 3000ML PPV (MISCELLANEOUS) ×3 IMPLANT
CHLORAPREP W/TINT 26 (MISCELLANEOUS) ×3 IMPLANT
CLIP APPLIE 5 13 M/L LIGAMAX5 (MISCELLANEOUS) ×2 IMPLANT
COVER MAYO STAND STRL (DRAPES) IMPLANT
COVER SURGICAL LIGHT HANDLE (MISCELLANEOUS) ×3 IMPLANT
COVER WAND RF STERILE (DRAPES) ×3 IMPLANT
DERMABOND ADVANCED (GAUZE/BANDAGES/DRESSINGS) ×1
DERMABOND ADVANCED .7 DNX12 (GAUZE/BANDAGES/DRESSINGS) ×2 IMPLANT
DRAPE C-ARM 42X120 X-RAY (DRAPES) IMPLANT
ELECT REM PT RETURN 9FT ADLT (ELECTROSURGICAL) ×3
ELECTRODE REM PT RTRN 9FT ADLT (ELECTROSURGICAL) ×2 IMPLANT
GLOVE SURG SIGNA 7.5 PF LTX (GLOVE) ×3 IMPLANT
GOWN STRL REUS W/ TWL LRG LVL3 (GOWN DISPOSABLE) ×4 IMPLANT
GOWN STRL REUS W/ TWL XL LVL3 (GOWN DISPOSABLE) ×2 IMPLANT
GOWN STRL REUS W/TWL LRG LVL3 (GOWN DISPOSABLE) ×6
GOWN STRL REUS W/TWL XL LVL3 (GOWN DISPOSABLE) ×2
KIT BASIN OR (CUSTOM PROCEDURE TRAY) ×3 IMPLANT
KIT TURNOVER KIT B (KITS) ×3 IMPLANT
NS IRRIG 1000ML POUR BTL (IV SOLUTION) ×3 IMPLANT
PAD ARMBOARD 7.5X6 YLW CONV (MISCELLANEOUS) ×3 IMPLANT
POUCH SPECIMEN RETRIEVAL 10MM (ENDOMECHANICALS) ×3 IMPLANT
SCISSORS LAP 5X35 DISP (ENDOMECHANICALS) ×3 IMPLANT
SET CHOLANGIOGRAPH 5 50 .035 (SET/KITS/TRAYS/PACK) IMPLANT
SET IRRIG TUBING LAPAROSCOPIC (IRRIGATION / IRRIGATOR) ×6 IMPLANT
SET TUBE SMOKE EVAC HIGH FLOW (TUBING) ×3 IMPLANT
SLEEVE ENDOPATH XCEL 5M (ENDOMECHANICALS) ×6 IMPLANT
SPECIMEN JAR SMALL (MISCELLANEOUS) ×3 IMPLANT
SUT MNCRL AB 4-0 PS2 18 (SUTURE) ×3 IMPLANT
TOWEL GREEN STERILE (TOWEL DISPOSABLE) ×3 IMPLANT
TOWEL GREEN STERILE FF (TOWEL DISPOSABLE) ×3 IMPLANT
TRAY LAPAROSCOPIC MC (CUSTOM PROCEDURE TRAY) ×3 IMPLANT
TROCAR XCEL BLUNT TIP 100MML (ENDOMECHANICALS) ×3 IMPLANT
TROCAR XCEL NON-BLD 5MMX100MML (ENDOMECHANICALS) ×3 IMPLANT
WATER STERILE IRR 1000ML POUR (IV SOLUTION) ×3 IMPLANT

## 2021-02-10 NOTE — Interval H&P Note (Signed)
History and Physical Interval Note: no change in H and P  02/10/2021 6:51 AM  Matthew Pratt  has presented today for surgery, with the diagnosis of BILIARY COLIC.  The various methods of treatment have been discussed with the patient and family. After consideration of risks, benefits and other options for treatment, the patient has consented to  Procedure(s): LAPAROSCOPIC CHOLECYSTECTOMY WITH INTRAOPERATIVE CHOLANGIOGRAM (N/A) as a surgical intervention.  The patient's history has been reviewed, patient examined, no change in status, stable for surgery.  I have reviewed the patient's chart and labs.  Questions were answered to the patient's satisfaction.     Coralie Keens

## 2021-02-10 NOTE — Anesthesia Postprocedure Evaluation (Signed)
Anesthesia Post Note  Patient: Matthew Pratt  Procedure(s) Performed: LAPAROSCOPIC CHOLECYSTECTOMY (N/A Abdomen)     Patient location during evaluation: PACU Anesthesia Type: General Level of consciousness: awake and alert and oriented Pain management: pain level controlled Vital Signs Assessment: post-procedure vital signs reviewed and stable Respiratory status: spontaneous breathing, nonlabored ventilation and respiratory function stable Cardiovascular status: blood pressure returned to baseline Postop Assessment: no apparent nausea or vomiting Anesthetic complications: no   No complications documented.  Last Vitals:  Vitals:   02/10/21 1040 02/10/21 1054  BP: 138/83 136/84  Pulse: (!) 52 (!) 53  Resp: 11 16  Temp: 36.5 C 36.7 C  SpO2: 97% 95%    Last Pain:  Vitals:   02/10/21 1054  TempSrc: Oral  PainSc:                  Brennan Bailey

## 2021-02-10 NOTE — Anesthesia Procedure Notes (Signed)
Procedure Name: Intubation Date/Time: 02/10/2021 7:33 AM Performed by: Bryson Corona, CRNA Pre-anesthesia Checklist: Patient identified, Emergency Drugs available, Suction available and Patient being monitored Patient Re-evaluated:Patient Re-evaluated prior to induction Oxygen Delivery Method: Circle System Utilized Preoxygenation: Pre-oxygenation with 100% oxygen Induction Type: IV induction Ventilation: Mask ventilation without difficulty Laryngoscope Size: Mac and 4 Grade View: Grade II Tube type: Oral Tube size: 7.0 mm Number of attempts: 1 Airway Equipment and Method: Stylet and Oral airway Placement Confirmation: ETT inserted through vocal cords under direct vision,  positive ETCO2 and breath sounds checked- equal and bilateral Secured at: 23 cm Tube secured with: Tape Dental Injury: Teeth and Oropharynx as per pre-operative assessment

## 2021-02-10 NOTE — Op Note (Signed)
Operative Note   Date: 02/10/21  Patient: Matthew Pratt MRN: [id number]  Preoperative Diagnosis: Recurrent biliary colic   Postoperative Diagnosis: Same   Procedure: Laparoscopic cholecystectomy  Surgeon: Coralie Keens, MD   Assistant: Jerilee Hoh, MD   EBL: 25 ml  Anesthesia: GETA  Specimens: Gallbladder for permanent   Indications: [name] is a 70 y.o. man with a history of recurrent right upper quadrant abdominal pain with meals and concern for recurrent biliary colic.  Findings: Mildly inflamed gallbladder with short cystic duct; successful cholecystectomy performed after obtaining critical view of safety   Procedure details: Informed consent was obtained in the preoperative area prior to the procedure. The patient was brought to the operating room and placed on the table in the supine position. General anesthesia was induced and appropriate lines and drains were placed for intraoperative monitoring. Perioperative antibiotics were administered per SCIP guidelines. The abdomen was prepped and draped in the usual sterile fashion. A pre-procedure timeout was taken verifying patient identity, surgical site and procedure to be performed.  A small infraumbilical vertical skin incision was made, the subcutaneous tissue was divided with blunt dissection and the fascia was was grasped and elevated. The fascia was incised and the peritoneal cavity was directly visualized. A 55mm Hassan trocar was placed. The peritoneal cavity was inspected with no evidence of visceral or vascular injury. Three 86mm ports were placed, two along the right subcostal margin and a third in the epigastric area, all under direct visualization. The fundus of the gallbladder was grasped and retracted cephalad. The infundibulum was retracted laterally. The cystic triangle was dissected out using blunt dissection and cautery, and the critical view of safety was obtained. The cystic duct appeared to be relatively  short, and it was decided not to proceed with intraoperative cholangiogram. The cystic duct was clipped with three 10 mm clips on the stay side and one on the gallbladder side prior to division. The cystic artery was clipped with two clips on the stay side and one on the gallbladder side prior to division. The gallbladder was taken off the liver using cautery. The specimen was placed in an endocatch bag and removed. A small amount of bilious contents were spilled during the dissection, and were extensively irrigated and suctioned dry.   The surgical site was irrigated with saline until the effluent was clear. Hemostasis was achieved in the gallbladder fossa using cautery. The cystic duct and artery stumps were visually inspected and there was no evidence of bile leak or bleeding. The ports were removed under direct visualization and the abdomen was desufflated. 20 ml of local anesthetic was infiltrated among the port sites. The umbilical port site fascia was closed with a 0 vicryl pursestring suture. The skin at all port sites was closed with 4-0 monocryl subcuticular suture. Dermabond was applied.  All counts were correct x2 at the end of the procedure. The patient was extubated and taken to PACU in stable condition. Dr.Blackman was present for the entire procedure.

## 2021-02-10 NOTE — Transfer of Care (Signed)
Immediate Anesthesia Transfer of Care Note  Patient: Abayomi Pattison Crehan  Procedure(s) Performed: LAPAROSCOPIC CHOLECYSTECTOMY (N/A Abdomen)  Patient Location: PACU  Anesthesia Type:General  Level of Consciousness: awake and alert   Airway & Oxygen Therapy: Patient Spontanous Breathing and Patient connected to face mask oxygen  Post-op Assessment: Report given to RN and Post -op Vital signs reviewed and stable  Post vital signs: Reviewed and stable  Last Vitals:  Vitals Value Taken Time  BP 182/105 02/10/21 0845  Temp    Pulse 57 02/10/21 0845  Resp 16 02/10/21 0845  SpO2 98 % 02/10/21 0845  Vitals shown include unvalidated device data.  Last Pain:  Vitals:   02/10/21 0637  TempSrc: Oral  PainSc:          Complications: No complications documented.

## 2021-02-10 NOTE — Progress Notes (Signed)
Patient arrived to unit at approximately 1100. Assessment completed and documented per protocol. Alert and oriented x4. Pain managed with prn oxy. Skin glue to lap sites.

## 2021-02-11 ENCOUNTER — Encounter (HOSPITAL_COMMUNITY): Payer: Self-pay | Admitting: Surgery

## 2021-02-11 DIAGNOSIS — I509 Heart failure, unspecified: Secondary | ICD-10-CM | POA: Diagnosis not present

## 2021-02-11 DIAGNOSIS — K8050 Calculus of bile duct without cholangitis or cholecystitis without obstruction: Secondary | ICD-10-CM | POA: Diagnosis not present

## 2021-02-11 DIAGNOSIS — Z79899 Other long term (current) drug therapy: Secondary | ICD-10-CM | POA: Diagnosis not present

## 2021-02-11 LAB — SURGICAL PATHOLOGY

## 2021-02-11 MED ORDER — TRAMADOL HCL 50 MG PO TABS: 50.0000 mg | ORAL_TABLET | Freq: Four times a day (QID) | ORAL | 0 refills | Status: DC | PRN

## 2021-02-11 NOTE — Progress Notes (Signed)
Patient ID: Matthew Pratt, male   DOB: [birth year], 70 y.o.   MRN: [id number]   Doing well Will D/C to home

## 2021-02-11 NOTE — Discharge Summary (Signed)
Physician Discharge Summary  Patient ID: Matthew Pratt MRN: [id number] DOB/AGE: [birth year] 70 y.o.  Admit date: 02/10/2021 Discharge date: 02/11/2021  Admission Diagnoses:  Discharge Diagnoses:  Active Problems:   S/P laparoscopic cholecystectomy   Discharged Condition: good  Hospital Course: uneventful post op recovery.  Discharged home POD#1  Consults: None  Significant Diagnostic Studies:   Treatments: surgery: lap chole  Discharge Exam: Blood pressure 126/81, pulse 60, temperature 98 F (36.7 C), temperature source Oral, resp. rate 19, height 6\' 1"  (1.854 m), weight 102.1 kg, SpO2 96 %. General appearance: alert, cooperative and no distress Resp: clear to auscultation bilaterally Cardio: regular rate and rhythm, S1, S2 normal, no murmur, click, rub or gallop Incision/Wound: abdomen soft, incisions clean  Disposition:    Discharge home   Follow-up Information    Coralie Keens, MD. Schedule an appointment as soon as possible for a visit in 3 week(s).   Specialty: General Surgery Contact information: [address] [place] [address] [phone]               Signed: Coralie Keens 02/11/2021, 7:41 AM

## 2021-02-11 NOTE — Discharge Instructions (Signed)
[facility] ______[facility] LAPAROSCOPIC SURGERY: POST OP INSTRUCTIONS Always review your discharge instruction sheet given to you by the facility where your surgery was performed. IF YOU HAVE DISABILITY OR FAMILY LEAVE FORMS, YOU MUST BRING THEM TO THE OFFICE FOR PROCESSING.   DO NOT GIVE THEM TO YOUR DOCTOR.  1. A prescription for pain medication may be given to you upon discharge.  Take your pain medication as prescribed, if needed.  If narcotic pain medicine is not needed, then you may take acetaminophen (Tylenol) or ibuprofen (Advil) as needed. 2. Take your usually prescribed medications unless otherwise directed. 3. If you need a refill on your pain medication, please contact your pharmacy.  They will contact our office to request authorization. Prescriptions will not be filled after 5pm or on week-ends. 4. You should follow a light diet the first few days after arrival home, such as soup and crackers, etc.  Be sure to include lots of fluids daily. 5. Most patients will experience some swelling and bruising in the area of the incisions.  Ice packs will help.  Swelling and bruising can take several days to resolve.  6. It is common to experience some constipation if taking pain medication after surgery.  Increasing fluid intake and taking a stool softener (such as Colace) will usually help or prevent this problem from occurring.  A mild laxative (Milk of Magnesia or Miralax) should be taken according to package instructions if there are no bowel movements after 48 hours. 7. Unless discharge instructions indicate otherwise, you may remove your bandages 24-48 hours after surgery, and you may shower at that time.  You may have steri-strips (small skin tapes) in place directly over the incision.  These strips should be left on the skin for 7-10 days.  If your surgeon used skin glue on the incision, you may shower in 24 hours.  The glue will flake off over the next 2-3 weeks.  Any sutures or  staples will be removed at the office during your follow-up visit. 8. ACTIVITIES:  You may resume regular (light) daily activities beginning the next day--such as daily self-care, walking, climbing stairs--gradually increasing activities as tolerated.  You may have sexual intercourse when it is comfortable.  Refrain from any heavy lifting or straining until approved by your doctor. a. You may drive when you are no longer taking prescription pain medication, you can comfortably wear a seatbelt, and you can safely maneuver your car and apply brakes. b. RETURN TO WORK:  __________________________________________________________ 9. You should see your doctor in the office for a follow-up appointment approximately 2-3 weeks after your surgery.  Make sure that you call for this appointment within a day or two after you arrive home to insure a convenient appointment time. 10. OTHER INSTRUCTIONS:OK TO SHOWER STARTING TODAY 11. NO LIFTING MORE THAN 15 TO 20 POUNDS FOR 2 WEEKS 12. ICE PACK, TYLENOL, AND IBUPROFEN ALSO FOR PAIN __________________________________________________________________________________________________________________________ __________________________________________________________________________________________________________________________ WHEN TO CALL YOUR DOCTOR: 1. Fever over 101.0 2. Inability to urinate 3. Continued bleeding from incision. 4. Increased pain, redness, or drainage from the incision. 5. Increasing abdominal pain  The clinic staff is available to answer your questions during regular business hours.  Please don't hesitate to call and ask to speak to one of the nurses for clinical concerns.  If you have a medical emergency, go to the nearest emergency room or call 911.  A surgeon from [facility] is always on call at the hospital. [address],  [place], [place], [place]  [address] ? [address], [place], [place]   [address] [phone] ?  [phone] ? FAX [phone] Web site: [email or web address]

## 2021-02-19 ENCOUNTER — Other Ambulatory Visit (INDEPENDENT_AMBULATORY_CARE_PROVIDER_SITE_OTHER): Payer: Medicare Other

## 2021-02-19 ENCOUNTER — Other Ambulatory Visit: Payer: Self-pay

## 2021-02-19 ENCOUNTER — Ambulatory Visit (INDEPENDENT_AMBULATORY_CARE_PROVIDER_SITE_OTHER): Payer: Medicare Other | Admitting: Pharmacist

## 2021-02-19 DIAGNOSIS — R5382 Chronic fatigue, unspecified: Secondary | ICD-10-CM | POA: Diagnosis not present

## 2021-02-19 DIAGNOSIS — E785 Hyperlipidemia, unspecified: Secondary | ICD-10-CM

## 2021-02-19 DIAGNOSIS — M8580 Other specified disorders of bone density and structure, unspecified site: Secondary | ICD-10-CM

## 2021-02-19 DIAGNOSIS — E039 Hypothyroidism, unspecified: Secondary | ICD-10-CM

## 2021-02-19 LAB — TSH: TSH: 0.44 u[IU]/mL (ref 0.35–4.50)

## 2021-02-19 LAB — VITAMIN D 25 HYDROXY (VIT D DEFICIENCY, FRACTURES): VITD: 33.61 ng/mL (ref 30.00–100.00)

## 2021-02-19 LAB — VITAMIN B12: Vitamin B-12: 501 pg/mL (ref 211–911)

## 2021-02-19 NOTE — Progress Notes (Signed)
Chronic Care Management Pharmacy Note  03/03/2021 Name:  Matthew Pratt MRN:  [id number] DOB:  1951-04-22  Subjective: Matthew Pratt is an 70 y.o. year old male who is a primary patient of Burchette, Alinda Sierras, MD.  The CCM team was consulted for assistance with disease management and care coordination needs.    Engaged with patient face to face for follow up visit in response to provider referral for pharmacy case management and/or care coordination services.   Consent to Services:  The patient was given information about Chronic Care Management services, agreed to services, and gave verbal consent prior to initiation of services.  Please see initial visit note for detailed documentation.   Patient Care Team: [name], MD as PCP - General (Family Medicine) Viona Gilmore, [facility] as Pharmacist (Pharmacist)  Recent office visits: 01/06/21 [name], MD: Patient presented for rosacea. Prescribed doxycycline 100 mg daily and metronidazole gel.   11/11/20 [name], MD: Patient presented with abdominal pain. Plan to follow up with GI this week.  09/23/20 [name], MD: Patient presented to discuss MRI and abnormal lung x-ray. Recommended observation.  Recent consult visits: 01/07/21 Elder Cyphers (cardiology): Patient presented to the lipid clinic for initiation of Repatha.  12/30/20 Cherlynn Kaiser, MD (cardiology): Patient presented to establish care and get surgical clearance. Referred to lipid clinic.  Hospital visits: 3/8-02/11/21 Patient admitted for laparoscopic cholecystectomy.   Objective:  Lab Results  Component Value Date   CREATININE 1.12 02/10/2021   BUN 17 02/10/2021   GFR 69.70 11/10/2020   GFRNONAA >60 02/10/2021   GFRAA >60 07/01/2020   NA 138 02/10/2021   K 4.2 02/10/2021   CALCIUM 8.9 02/10/2021   CO2 26 02/10/2021    Lab Results  Component Value Date/Time   HGBA1C 5.6 06/29/2020 11:55 AM   HGBA1C (H) 10/16/2010 05:35 AM     5.7 (NOTE)                                                                       According to the ADA Clinical Practice Recommendations for 2011, when HbA1c is used as a screening test:   >=6.5%   Diagnostic of Diabetes Mellitus           (if abnormal result  is confirmed)  5.7-6.4%   Increased risk of developing Diabetes Mellitus  References:Diagnosis and Classification of Diabetes Mellitus,Diabetes HYQM,5784,69(GEXBM 1):S62-S69 and Standards of Medical Care in         Diabetes - 2011,Diabetes Care,2011,34  (Suppl 1):S11-S61.   GFR 69.70 11/10/2020 12:11 PM   GFR 73.77 08/06/2019 03:43 PM    Last diabetic Eye exam: No results found for: HMDIABEYEEXA  Last diabetic Foot exam: No results found for: HMDIABFOOTEX   Lab Results  Component Value Date   CHOL 138 03/02/2021   HDL 54 03/02/2021   LDLCALC 64 03/02/2021   TRIG 112 03/02/2021   CHOLHDL 2.6 03/02/2021    Hepatic Function Latest Ref Rng & Units 03/02/2021 11/13/2020 11/10/2020  Total Protein 6.0 - 8.5 g/dL 6.8 7.3 7.4  Albumin 3.7 - 4.7 g/dL 4.4 4.3 4.3  AST 0 - 40 IU/L 21 25 34  ALT 0 - 44 IU/L 24 77(H) 146(H)  Alk Phosphatase  44 - 121 IU/L 79 76 76  Total Bilirubin 0.0 - 1.2 mg/dL 0.7 0.9 1.0  Bilirubin, Direct 0.00 - 0.40 mg/dL 0.21 0.2 -    Lab Results  Component Value Date/Time   TSH 0.44 02/19/2021 09:24 AM   TSH 1.55 08/28/2019 10:08 AM    CBC Latest Ref Rng & Units 02/10/2021 11/10/2020 07/01/2020  WBC 4.0 - 10.5 K/uL 4.3 4.4 3.4(L)  Hemoglobin 13.0 - 17.0 g/dL 14.0 14.6 13.0  Hematocrit 39.0 - 52.0 % 40.4 42.8 38.6(L)  Platelets 150 - 400 K/uL 203 189.0 167    Lab Results  Component Value Date/Time   VD25OH 33.61 02/19/2021 09:24 AM   VD25OH 47.44 03/29/2019 12:43 PM    Clinical ASCVD: Yes  The ASCVD Risk score [name], et al., 2013) failed to calculate for the following reasons:   The patient has a prior MI or stroke diagnosis    Depression screen [facility] 2/9 04/18/2020 01/29/2019 07/26/2018  Decreased  Interest 0 3 0  Down, Depressed, Hopeless 0 2 0  PHQ - 2 Score 0 5 0  Altered sleeping - 3 -  Tired, decreased energy - 3 -  Change in appetite - 1 -  Feeling bad or failure about yourself  - 3 -  Trouble concentrating - 1 -  Moving slowly or fidgety/restless - 1 -  Suicidal thoughts - 1 -  PHQ-9 Score - 18 -  Difficult doing work/chores - Somewhat difficult -      Social History   Tobacco Use  Smoking Status Never Smoker  Smokeless Tobacco Never Used   BP Readings from Last 3 Encounters:  02/11/21 126/81  01/06/21 124/78  12/30/20 108/80   Pulse Readings from Last 3 Encounters:  02/11/21 60  01/06/21 (!) 59  12/30/20 (!) 48   Wt Readings from Last 3 Encounters:  02/10/21 225 lb (102.1 kg)  01/06/21 227 lb (103 kg)  12/30/20 228 lb 3.2 oz (103.5 kg)    Assessment/Interventions: Review of patient past medical history, allergies, medications, health status, including review of consultants reports, laboratory and other test data, was performed as part of comprehensive evaluation and provision of chronic care management services.   SDOH:  (Social Determinants of Health) assessments and interventions performed: No   CCM Care Plan  Allergies  Allergen Reactions  . Crestor [Rosuvastatin] Other (See Comments)    Myalgia.    . Iodinated Diagnostic Agents Hives and Itching  . Sulfa Antibiotics Rash  . Sulfonamide Derivatives Rash    REACTION: rash    Medications Reviewed Today    Reviewed by Nena Polio, RN (Registered Nurse) on 02/10/21 at [facility] List Status: Complete  Medication Order Taking? Sig Documenting Provider Last Dose Status Informant  aspirin EC 81 MG tablet [id number] Yes Take 81 mg by mouth daily.  [provider] 02/08/2021 Active Self           Med Note Tamala Julian, JEFFREY W   Fri Jun 27, 2020 11:32 PM)    b complex vitamins capsule [id number] Yes Take 1 capsule by mouth daily at 6 PM. [provider] 02/08/2021 Active Self   Cholecalciferol (VITAMIN D3) 50 MCG (2000 UT) capsule [id number] Yes Take 2,000 Units by mouth daily at 6 PM. [provider] 02/08/2021 Active Self  doxycycline (VIBRA-TABS) 100 MG tablet [id number] Yes Take 1 tablet (100 mg total) by mouth daily. [name], MD 02/09/2021 Unknown time Active Self  Evolocumab (REPATHA SURECLICK) 637 MG/ML SOAJ  [id number] Yes Inject 140 mg into the skin every 14 (fourteen) days.  Patient taking differently: Inject 140 mg into the skin every 14 (fourteen) days. In the afternoon   Elouise Munroe, MD 02/06/2021 Active   latanoprost (XALATAN) 0.005 % ophthalmic solution [id number] Yes Place 1 drop into both eyes at bedtime. [provider] 02/09/2021 Unknown time Active Self  metroNIDAZOLE (METROGEL) 1 % gel [id number] Yes Apply topically daily.  Patient taking differently: Apply 1 application topically at bedtime.   [name], MD 02/09/2021 Unknown time Active   omeprazole (PRILOSEC) 10 MG capsule [id number] Yes TAKE 1 CAPSULE BY MOUTH EVERY DAY  Patient taking differently: Take 20 mg by mouth daily at 6 PM.   [name], MD 02/09/2021 Unknown time Active   SYNTHROID 137 MCG tablet [id number] Yes TAKE 1 TABLET EVERY DAY  Patient taking differently: Take 137 mcg by mouth daily before breakfast. TAKE 1 TABLET EVERY DAY   Burchette, Alinda Sierras, MD 02/10/2021 0400 Active   timolol (TIMOPTIC) 0.5 % ophthalmic solution [id number] Yes Place 1 drop into both eyes 2 (two) times daily. [provider] 02/10/2021 Unknown time Active Self  TURMERIC PO [id number] Yes Take 3,100 mg by mouth daily at 6 PM. [provider] 02/08/2021 Active Self  venlafaxine XR (EFFEXOR-XR) 37.5 MG 24 hr capsule [id number] Yes TAKE 3 CAPSULES BY MOUTH EVERY DAY  Patient taking differently: Take 112.5 mg by mouth every evening.   [name], MD 02/09/2021 Unknown time Active   zinc gluconate 50 MG tablet [id number] Yes Take 50 mg by mouth daily at 6 PM.  [provider]  Active Self          Patient Active Problem List   Diagnosis Date Noted  . S/P laparoscopic cholecystectomy 02/10/2021  . Laryngitis from reflux of stomach acid 11/11/2020  . 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 Hepatic steatosis 07/04/2020  . T12 compression fracture ([place]) 07/04/2020  . Elevated LFTs 06/28/2020  . Abdominal pain 06/27/2020  . Mixed conductive and sensorineural hearing loss of left ear with restricted hearing of right ear 04/21/2020  . Osteopenia 04/19/2020  . Family history of colon cancer in 2 grandparents 10/28/2018  . NCGS (non-celiac gluten sensitivity) ? 10/28/2018  . 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 Hypothyroid 07/26/2018  . GERD (gastroesophageal reflux disease) 07/26/2018  . Barrett's esophagus 07/26/2018  . Rosacea 07/26/2018  . Medicare annual wellness visit, initial 02/24/2018  . Dupuytren's contracture of hand 06/23/2017  . H/O acute myocardial infarction 06/16/2017  . Fatigue 06/16/2017  . Benign prostatic hyperplasia with urinary hesitancy 06/16/2017  . OSA (obstructive sleep apnea) 12/03/2016  . Depression 12/03/2016  . Heartburn 10/15/2016  . Moderate episode of recurrent major depressive disorder ([place]) 07/22/2016  . Atypical chest pain 12/26/2015  . 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 Erectile dysfunction due to arterial insufficiency 03/26/2015  . 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  . Hyperlipidemia 10/26/2010  . DEPRESSION/ANXIETY 10/26/2010  . GLAUCOMA 10/26/2010  . MYOCARDIAL INFARCTION, ACUTE, INFEROLATERAL 10/26/2010  . CAD, NATIVE VESSEL 10/26/2010  . SINUS BRADYCARDIA 10/26/2010  . Glaucoma 10/26/2010    Immunization History  Administered Date(s) Administered  . Fluad Quad(high Dose 65+) 08/10/2019  . Influenza, High Dose Seasonal PF 09/18/2020  . Influenza-Unspecified 09/13/2016, 10/04/2017, 07/28/2018, 09/18/2020  . PFIZER(Purple Top)SARS-COV-2  Vaccination 01/27/2020, 02/20/2020, 09/18/2020  . Pneumococcal Conjugate-13 06/16/2017, 06/16/2017  . Pneumococcal Polysaccharide-23 08/10/2019  . Tdap 08/21/2018  . Zoster Recombinat (Shingrix) 11/13/2019,  04/09/2020    Conditions to be addressed/monitored:  Hyperlipidemia, Coronary Artery Disease, GERD, Hypothyroidism, Depression, Osteopenia and glaucoma  Care Plan : [place]  Updates made by Viona Gilmore, [place] since 03/03/2021 12:00 AM    Problem: Problem: Hyperlipidemia, Coronary Artery Disease, GERD, Hypothyroidism, Depression, Osteopenia and glaucoma     Long-Range Goal: Patient-Specific Goal   Start Date: 02/19/2021  Expected End Date: 02/19/2022  This Visit's Progress: On track  Priority: High  Note:   Current Barriers:  . Unable to independently monitor therapeutic efficacy . Unable to achieve control of cholesterol   Pharmacist Clinical Goal(s):  Marland Kitchen Patient will verbalize ability to afford treatment regimen . achieve adherence to monitoring guidelines and medication adherence to achieve therapeutic efficacy . achieve control of cholesterol as evidenced by next cholesterol check  through collaboration with PharmD and provider.   Interventions: . 1:1 collaboration with [name], MD regarding development and update of comprehensive plan of care as evidenced by provider attestation and co-signature . Inter-disciplinary care team collaboration (see longitudinal plan of care) . Comprehensive medication review performed; medication list updated in electronic medical record  Hyperlipidemia: (LDL goal < 70) -Uncontrolled -Current treatment: . Repatha 140 mg inject every 14 days -Medications previously tried: statins (LFT elevation)  -Current dietary patterns: patient has cut out a lot of fat -Current exercise habits: no structured exercise -Educated on Cholesterol goals;  Importance of limiting foods high in cholesterol; Exercise goal of 150  minutes per week; -Recommended to continue current medication  CAD/History of MI (Goal: prevent future heart attacks and strokes) -Controlled -Current treatment  . Aspirin 81 mg 1 tablet daily -Medications previously tried: none  -Counseled on monitoring for signs of bleeding such as unexplained and excessive bleeding from a cut or injury, easy or excessive bruising, blood in urine or stools, and nosebleeds without a known cause  Hypothyroidism (Goal: TSH 0.35-4.5) -Controlled -Current treatment  . Synthroid 137 mcg 1 tablet daily -Medications previously tried: none  -Recommended to continue current medication  GERD/Barrett's esophagus (Goal: minimize symptoms) -Controlled -Current treatment  . Omeprazole 20 mg 1 capsule daily -Medications previously tried: none  -Recommended to continue current medication  Rosacea (Goal: minimize symptoms) -Controlled -Current treatment  . Doxycycline 100 mg 1 tablet d . Metronidazole 1% gel apply daily -Medications previously tried: none  -Recommended to continue current medication  Depression (Goal: minimize symptoms) -Controlled -Current treatment: . Venlafaxine XR 37.5 mg 3 capsules daily -Medications previously tried/failed: SSRIs (bruising) -PHQ9: 0 -Educated on Benefits of medication for symptom control -Recommended to continue current medication   Osteopenia (Goal prevent fractures) -Controlled -Last DEXA Scan: 04/16/20              T-Score femoral neck: RFN -1.2, LFN -1.3             T-Score total hip: n/a             T-Score lumbar spine: -1.3             T-Score forearm radius: n/a             10-year probability of major osteoporotic fracture: 9.9%             10-year probability of hip fracture: 3.1% -Patient is not a candidate for pharmacologic treatment -Current treatment  . Vitamin D 2000 units 1 tablet daily . Calcium 600 mg 1 tablet daily -Medications previously tried: none -Recommend [phone] units of vitamin  D daily. Recommend 1200  mg of calcium daily from dietary and supplemental sources. Recommend weight-bearing and muscle strengthening exercises for building and maintaining bone density. -Recommended to continue current medication  Glaucoma (Goal: lower intraocular pressure) -Controlled -Current treatment   Latanoprost 0.005% 1 drop in both eyes at bedtime  Timolol 0.5% 1 drop in both eyes twice daily -Medications previously tried: none  -Recommended to continue current medication  Health Maintenance -Vaccine gaps: none -Current therapy:   Vitamin B complex daily  Turmeric 3100-9000 mg daily  Zinc 50 mg 1 tablet daily -Educated on Cost vs benefit of each product must be carefully weighed by individual consumer -Patient is satisfied with current therapy and denies issues -Recommended to continue current medication  Patient Goals/Self-Care Activities . Patient will:  - take medications as prescribed target a minimum of 150 minutes of moderate intensity exercise weekly  Follow Up Plan: Face to Face appointment with care management team member scheduled for:  6 months     Medication Assistance: Repatha obtained through [facility] medication assistance program.  Enrollment ends when grant funding runs out  Patient's preferred pharmacy is:  [id number] [address]Phone: [phone]: [phone] Uses pill box? Yes - two pill boxes - 1 for AM and 1 for PM Pt endorses 90% compliance - patient reports he has been more compliant with his morning medications  We discussed: Current pharmacy is preferred with insurance plan and patient is satisfied with pharmacy services Patient decided to: Continue current medication management strategy  Care Plan and Follow Up Patient Decision:  Patient agrees to Care Plan and Follow-up.  Plan: Face to Face  appointment with care management team member scheduled for: 6 months  MJeni Salles PharmD BDarlingtonPharmacist LMammoth Lakesat BHot Springs [phone]

## 2021-03-02 DIAGNOSIS — E785 Hyperlipidemia, unspecified: Secondary | ICD-10-CM | POA: Diagnosis not present

## 2021-03-02 LAB — LIPID PANEL
Chol/HDL Ratio: 2.6 ratio (ref 0.0–5.0)
Cholesterol, Total: 138 mg/dL (ref 100–199)
HDL: 54 mg/dL (ref >39)
LDL Chol Calc (NIH): 64 mg/dL (ref 0–99)
Triglycerides: 112 mg/dL (ref 0–149)
VLDL Cholesterol Cal: 20 mg/dL (ref 5–40)

## 2021-03-02 LAB — HEPATIC FUNCTION PANEL
ALT: 24 IU/L (ref 0–44)
AST: 21 IU/L (ref 0–40)
Albumin: 4.4 g/dL (ref 3.7–4.7)
Alkaline Phosphatase: 79 IU/L (ref 44–121)
Bilirubin Total: 0.7 mg/dL (ref 0.0–1.2)
Bilirubin, Direct: 0.21 mg/dL (ref 0.00–0.40)
Total Protein: 6.8 g/dL (ref 6.0–8.5)

## 2021-03-03 NOTE — Patient Instructions (Addendum)
Hi Matthew Pratt,  It was great to get to see you again in person! Below is a summary of some of the topics we discussed. Keep working on trying to add more exercise during your day and also those changes to your diet that we discussed to lower your cholesterol.   Please reach out to me if you have any questions or need anything before our follow up!  Best, [name]  Jeni Salles, PharmD, [facility]  Visit Information  Goals Addressed   None    Patient Care Plan: [facility] Pharmacy Care Plan    Problem Identified: Problem: Hyperlipidemia, Coronary Artery Disease, GERD, Hypothyroidism, Depression, Osteopenia and glaucoma     Long-Range Goal: Patient-Specific Goal   Start Date: 02/19/2021  Expected End Date: 02/19/2022  This Visit's Progress: On track  Priority: High  Note:   Current Barriers:  . Unable to independently monitor therapeutic efficacy . Unable to achieve control of cholesterol   Pharmacist Clinical Goal(s):  Marland Kitchen Patient will verbalize ability to afford treatment regimen . achieve adherence to monitoring guidelines and medication adherence to achieve therapeutic efficacy . achieve control of cholesterol as evidenced by next cholesterol check  through collaboration with PharmD and provider.   Interventions: . 1:1 collaboration with [name], MD regarding development and update of comprehensive plan of care as evidenced by provider attestation and co-signature . Inter-disciplinary care team collaboration (see longitudinal plan of care) . Comprehensive medication review performed; medication list updated in electronic medical record  Hyperlipidemia: (LDL goal < 70) -Uncontrolled -Current treatment: . Repatha 140 mg inject every 14 days -Medications previously tried: statins (LFT elevation)  -Current dietary patterns: patient has cut out a lot of fat -Current exercise habits: no structured exercise -Educated  on Cholesterol goals;  Importance of limiting foods high in cholesterol; Exercise goal of 150 minutes per week; -Recommended to continue current medication  CAD/History of MI (Goal: prevent future heart attacks and strokes) -Controlled -Current treatment  . Aspirin 81 mg 1 tablet daily -Medications previously tried: none  -Counseled on monitoring for signs of bleeding such as unexplained and excessive bleeding from a cut or injury, easy or excessive bruising, blood in urine or stools, and nosebleeds without a known cause  Hypothyroidism (Goal: TSH 0.35-4.5) -Controlled -Current treatment  . Synthroid 137 mcg 1 tablet daily -Medications previously tried: none  -Recommended to continue current medication  GERD/Barrett's esophagus (Goal: minimize symptoms) -Controlled -Current treatment  . Omeprazole 20 mg 1 capsule daily -Medications previously tried: none  -Recommended to continue current medication  Rosacea (Goal: minimize symptoms) -Controlled -Current treatment  . Doxycycline 100 mg 1 tablet d . Metronidazole 1% gel apply daily -Medications previously tried: none  -Recommended to continue current medication  Depression (Goal: minimize symptoms) -Controlled -Current treatment: . Venlafaxine XR 37.5 mg 3 capsules daily -Medications previously tried/failed: SSRIs (bruising) -PHQ9: 0 -Educated on Benefits of medication for symptom control -Recommended to continue current medication   Osteopenia (Goal prevent fractures) -Controlled -Last DEXA Scan: 04/16/20              T-Score femoral neck: RFN -1.2, LFN -1.3             T-Score total hip: n/a             T-Score lumbar spine: -1.3             T-Score forearm radius: n/a  10-year probability of major osteoporotic fracture: 9.9%             10-year probability of hip fracture: 3.1% -Patient is not a candidate for pharmacologic treatment -Current treatment  . Vitamin D 2000 units 1 tablet daily . Calcium  600 mg 1 tablet daily -Medications previously tried: none -Recommend [phone] units of vitamin D daily. Recommend 1200 mg of calcium daily from dietary and supplemental sources. Recommend weight-bearing and muscle strengthening exercises for building and maintaining bone density. -Recommended to continue current medication  Glaucoma (Goal: lower intraocular pressure) -Controlled -Current treatment   Latanoprost 0.005% 1 drop in both eyes at bedtime  Timolol 0.5% 1 drop in both eyes twice daily -Medications previously tried: none  -Recommended to continue current medication  Health Maintenance -Vaccine gaps: none -Current therapy:   Vitamin B complex daily  Turmeric 3100-9000 mg daily  Zinc 50 mg 1 tablet daily -Educated on Cost vs benefit of each product must be carefully weighed by individual consumer -Patient is satisfied with current therapy and denies issues -Recommended to continue current medication  Patient Goals/Self-Care Activities . Patient will:  - take medications as prescribed target a minimum of 150 minutes of moderate intensity exercise weekly  Follow Up Plan: Face to Face appointment with care management team member scheduled for:  6 months      Patient verbalizes understanding of instructions provided today and agrees to view in [place].  Face to Face appointment with pharmacist scheduled for:   Viona Gilmore, [facility]  Dyslipidemia Dyslipidemia is an imbalance of waxy, fat-like substances (lipids) in the blood. The body needs lipids in small amounts. Dyslipidemia often involves a high level of cholesterol or triglycerides, which are types of lipids. Common forms of dyslipidemia include:  High levels of LDL cholesterol. LDL is the type of cholesterol that causes fatty deposits (plaques) to build up in the blood vessels that carry blood away from your heart (arteries).  Low levels of HDL cholesterol. HDL cholesterol is the type of cholesterol that  protects against heart disease. High levels of HDL remove the LDL buildup from arteries.  High levels of triglycerides. Triglycerides are a fatty substance in the blood that is linked to a buildup of plaques in the arteries. What are the causes? Primary dyslipidemia is caused by changes (mutations) in genes that are passed down through families (inherited). These mutations cause several types of dyslipidemia. Secondary dyslipidemia is caused by lifestyle choices and diseases that lead to dyslipidemia, such as:  Eating a diet that is high in animal fat.  Not getting enough exercise.  Having diabetes, kidney disease, liver disease, or thyroid disease.  Drinking large amounts of alcohol.  Using certain medicines. What increases the risk? You are more likely to develop this condition if you are an older man or if you are a woman who has gone through menopause. Other risk factors include:  Having a family history of dyslipidemia.  Taking certain medicines, including birth control pills, steroids, some diuretics, and beta-blockers.  Smoking cigarettes.  Eating a high-fat diet.  Having certain medical conditions such as diabetes, polycystic ovary syndrome (PCOS), kidney disease, liver disease, or hypothyroidism.  Not exercising regularly.  Being overweight or obese with too much belly fat. What are the signs or symptoms? In most cases, dyslipidemia does not usually cause any symptoms. In severe cases, very high lipid levels can cause:  Fatty bumps under the skin (xanthomas).  White or gray ring around the black center (pupil) of  the eye. Very high triglyceride levels can cause inflammation of the pancreas (pancreatitis). How is this diagnosed? Your health care provider may diagnose dyslipidemia based on a routine blood test (fasting blood test). Because most people do not have symptoms of the condition, this blood testing (lipid profile) is done on adults age 64 and older and is  repeated every 5 years. This test checks:  Total cholesterol. This measures the total amount of cholesterol in your blood, including LDL cholesterol, HDL cholesterol, and triglycerides. A healthy number is below 200.  LDL cholesterol. The target number for LDL cholesterol is different for each person, depending on individual risk factors. Ask your health care provider what your LDL cholesterol should be.  HDL cholesterol. An HDL level of 60 or higher is best because it helps to protect against heart disease. A number below 2 for men or below 5 for women increases the risk for heart disease.  Triglycerides. A healthy triglyceride number is below 150. If your lipid profile is abnormal, your health care provider may do other blood tests.   How is this treated? Treatment depends on the type of dyslipidemia that you have and your other risk factors for heart disease and stroke. Your health care provider will have a target range for your lipid levels based on this information. For many people, this condition may be treated by lifestyle changes, such as diet and exercise. Your health care provider may recommend that you:  Get regular exercise.  Make changes to your diet.  Quit smoking if you smoke. If diet changes and exercise do not help you reach your goals, your health care provider may also prescribe medicine to lower lipids. The most commonly prescribed type of medicine lowers your LDL cholesterol (statin drug). If you have a high triglyceride level, your provider may prescribe another type of drug (fibrate) or an omega-3 fish oil supplement, or both. Follow these instructions at home: Eating and drinking  Follow instructions from your health care provider or dietitian about eating or drinking restrictions.  Eat a healthy diet as told by your health care provider. This can help you reach and maintain a healthy weight, lower your LDL cholesterol, and raise your HDL cholesterol. This may  include: ? Limiting your calories, if you are overweight. ? Eating more fruits, vegetables, whole grains, fish, and lean meats. ? Limiting saturated fat, trans fat, and cholesterol.  If you drink alcohol: ? Limit how much you use. ? Be aware of how much alcohol is in your drink. In the [place] one drink equals one 12 oz bottle of beer (355 mL), one 5 oz glass of wine (148 mL), or one 1 oz glass of hard liquor (44 mL).  Do not drink alcohol if: ? Your health care provider tells you not to drink. ? You are pregnant, may be pregnant, or are planning to become pregnant. Activity  Get regular exercise. Start an exercise and strength training program as told by your health care provider. Ask your health care provider what activities are safe for you. Your health care provider may recommend: ? 30 minutes of aerobic activity 4-6 days a week. Brisk walking is an example of aerobic activity. ? Strength training 2 days a week. General instructions  Do not use any products that contain nicotine or tobacco, such as cigarettes, e-cigarettes, and chewing tobacco. If you need help quitting, ask your health care provider.  Take over-the-counter and prescription medicines only as told by your health care provider. This  includes supplements.  Keep all follow-up visits as told by your health care provider.   Contact a health care provider if:  You are: ? Having trouble sticking to your exercise or diet plan. ? Struggling to quit smoking or control your use of alcohol. Summary  Dyslipidemia often involves a high level of cholesterol or triglycerides, which are types of lipids.  Treatment depends on the type of dyslipidemia that you have and your other risk factors for heart disease and stroke.  For many people, treatment starts with lifestyle changes, such as diet and exercise.  Your health care provider may prescribe medicine to lower lipids. This information is not intended to replace advice given  to you by your health care provider. Make sure you discuss any questions you have with your health care provider. Document Revised: 07/17/2018 Document Reviewed: 06/23/2018 Elsevier Patient Education  [place].

## 2021-03-04 ENCOUNTER — Encounter: Payer: Self-pay | Admitting: Family Medicine

## 2021-03-06 ENCOUNTER — Encounter: Payer: Self-pay | Admitting: Family Medicine

## 2021-03-06 DIAGNOSIS — L719 Rosacea, unspecified: Secondary | ICD-10-CM

## 2021-03-09 ENCOUNTER — Other Ambulatory Visit: Payer: Self-pay | Admitting: Pharmacist Clinician (PhC)/ Clinical Pharmacy Specialist

## 2021-03-09 DIAGNOSIS — I2510 Atherosclerotic heart disease of native coronary artery without angina pectoris: Secondary | ICD-10-CM

## 2021-03-25 DIAGNOSIS — T466X5A Adverse effect of antihyperlipidemic and antiarteriosclerotic drugs, initial encounter: Secondary | ICD-10-CM | POA: Insufficient documentation

## 2021-03-25 DIAGNOSIS — I2510 Atherosclerotic heart disease of native coronary artery without angina pectoris: Secondary | ICD-10-CM | POA: Diagnosis not present

## 2021-03-25 DIAGNOSIS — G720 Drug-induced myopathy: Secondary | ICD-10-CM | POA: Insufficient documentation

## 2021-03-25 LAB — LIPID PANEL
Chol/HDL Ratio: 2.8 ratio (ref 0.0–5.0)
Cholesterol, Total: 163 mg/dL (ref 100–199)
HDL: 59 mg/dL (ref >39)
LDL Chol Calc (NIH): 86 mg/dL (ref 0–99)
Triglycerides: 101 mg/dL (ref 0–149)
VLDL Cholesterol Cal: 18 mg/dL (ref 5–40)

## 2021-03-25 LAB — HEPATIC FUNCTION PANEL
ALT: 33 IU/L (ref 0–44)
AST: 28 IU/L (ref 0–40)
Albumin: 4.6 g/dL (ref 3.7–4.7)
Alkaline Phosphatase: 79 IU/L (ref 44–121)
Bilirubin Total: 1 mg/dL (ref 0.0–1.2)
Bilirubin, Direct: 0.26 mg/dL (ref 0.00–0.40)
Total Protein: 7 g/dL (ref 6.0–8.5)

## 2021-03-26 ENCOUNTER — Telehealth: Payer: Self-pay | Admitting: Pharmacist Clinician (PhC)/ Clinical Pharmacy Specialist

## 2021-03-26 NOTE — Telephone Encounter (Signed)
Repatha removed from medicine list

## 2021-03-31 DIAGNOSIS — L578 Other skin changes due to chronic exposure to nonionizing radiation: Secondary | ICD-10-CM | POA: Diagnosis not present

## 2021-03-31 DIAGNOSIS — L218 Other seborrheic dermatitis: Secondary | ICD-10-CM | POA: Diagnosis not present

## 2021-03-31 DIAGNOSIS — D1801 Hemangioma of skin and subcutaneous tissue: Secondary | ICD-10-CM | POA: Diagnosis not present

## 2021-03-31 DIAGNOSIS — Z85828 Personal history of other malignant neoplasm of skin: Secondary | ICD-10-CM | POA: Diagnosis not present

## 2021-03-31 DIAGNOSIS — L718 Other rosacea: Secondary | ICD-10-CM | POA: Diagnosis not present

## 2021-03-31 DIAGNOSIS — L821 Other seborrheic keratosis: Secondary | ICD-10-CM | POA: Diagnosis not present

## 2021-03-31 DIAGNOSIS — L570 Actinic keratosis: Secondary | ICD-10-CM | POA: Diagnosis not present

## 2021-03-31 DIAGNOSIS — L905 Scar conditions and fibrosis of skin: Secondary | ICD-10-CM | POA: Diagnosis not present

## 2021-04-07 ENCOUNTER — Telehealth: Payer: Medicare Other | Admitting: Physician Assistant

## 2021-04-07 ENCOUNTER — Ambulatory Visit: Payer: Medicare Other | Attending: Critical Care Medicine

## 2021-04-07 DIAGNOSIS — Z20822 Contact with and (suspected) exposure to covid-19: Secondary | ICD-10-CM

## 2021-04-07 DIAGNOSIS — R221 Localized swelling, mass and lump, neck: Secondary | ICD-10-CM | POA: Diagnosis not present

## 2021-04-07 DIAGNOSIS — J019 Acute sinusitis, unspecified: Secondary | ICD-10-CM | POA: Diagnosis not present

## 2021-04-07 DIAGNOSIS — B9689 Other specified bacterial agents as the cause of diseases classified elsewhere: Secondary | ICD-10-CM | POA: Diagnosis not present

## 2021-04-07 MED ORDER — DOXYCYCLINE HYCLATE 100 MG PO CAPS
100.0000 mg | ORAL_CAPSULE | Freq: Two times a day (BID) | ORAL | 0 refills | Status: DC
Start: 2021-04-07 — End: 2021-04-29

## 2021-04-07 NOTE — Progress Notes (Signed)
I have spent 5 minutes in review of e-visit questionnaire, review and updating patient chart, medical decision making and response to patient.   Azariel Banik Cody Kechia Yahnke, PA-C    

## 2021-04-07 NOTE — Progress Notes (Signed)
We are sorry that you are not feeling well.  Here is how we plan to help!  Based on what you have shared with me it looks like you have sinusitis.  Sinusitis is inflammation and infection in the sinus cavities of the head.  Based on your presentation I believe you most likely have Acute Bacterial Sinusitis.  This is an infection caused by bacteria and is treated with antibiotics. I have prescribed Doxycycline 100mg  by mouth twice a day for 10 days. You may use an oral decongestant such as Mucinex D or if you have glaucoma or high blood pressure use plain Mucinex. Saline nasal spray help and can safely be used as often as needed for congestion.  If you develop worsening sinus pain, fever or notice severe headache and vision changes, or if symptoms are not better after completion of antibiotic, please schedule an appointment with a health care provider.    Because you are over 65 and have other health issues, I do recommend you be tested for COVID to be on the safe side. You can go to your local [facility] or [facility] (by appointment) for this or you can schedule testing through [facility] health by going to [email or web address].  Also with the mention of the nodule and history of thyroid issues, you need to schedule an evaluation with your primary care provider regarding this. Please give them a call.   Sinus infections are not as easily transmitted as other respiratory infection, however we still recommend that you avoid close contact with loved ones, especially the very young and elderly.  Remember to wash your hands thoroughly throughout the day as this is the number one way to prevent the spread of infection!  Home Care:  Only take medications as instructed by your medical team.  Complete the entire course of an antibiotic.  Do not take these medications with alcohol.  A steam or ultrasonic humidifier can help congestion.  You can place a towel over your head and breathe in the steam from hot  water coming from a faucet.  Avoid close contacts especially the very young and the elderly.  Cover your mouth when you cough or sneeze.  Always remember to wash your hands.  Get Help Right Away If:  You develop worsening fever or sinus pain.  You develop a severe head ache or visual changes.  Your symptoms persist after you have completed your treatment plan.  Make sure you  Understand these instructions.  Will watch your condition.  Will get help right away if you are not doing well or get worse.  Your e-visit answers were reviewed by a board certified advanced clinical practitioner to complete your personal care plan.  Depending on the condition, your plan could have included both over the counter or prescription medications.  If there is a problem please reply  once you have received a response from your provider.  Your safety is important to [place].  If you have drug allergies check your prescription carefully.    You can use MyChart to ask questions about today's visit, request a non-urgent call back, or ask for a work or school excuse for 24 hours related to this e-Visit. If it has been greater than 24 hours you will need to follow up with your provider, or enter a new e-Visit to address those concerns.  You will get an e-mail in the next two days asking about your experience.  I hope that your e-visit has been valuable and will speed  your recovery. Thank you for using e-visits.

## 2021-04-08 LAB — NOVEL CORONAVIRUS, NAA: SARS-CoV-2, NAA: NOT DETECTED

## 2021-04-16 NOTE — Progress Notes (Signed)
Subjective:   Matthew Pratt is a 70 y.o. male who presents for an Initial Medicare Annual Wellness Visit.  Review of Systems    n/a Cardiac Risk Factors include: advanced age (>51men, >28 women);dyslipidemia;hypertension;male gender     Objective:    Today's Vitals   04/17/21 0954  BP: 122/72  Pulse: 70  Temp: 97.6 F (36.4 C)  SpO2: 96%  Weight: 226 lb (102.5 kg)  Height: $Remove'6\' 1"'GbmYQxn$  (1.854 m)   Body mass index is 29.82 kg/m.  Advanced Directives 04/17/2021 02/10/2021 06/29/2020 02/17/2017 01/17/2017  Does Patient Have a Medical Advance Directive? Yes Yes No No Yes  Type of Paramedic of [place];Living will Living will - - Living will  Does patient want to make changes to medical advance directive? - No - Patient declined - - -  Copy of [place] in Chart? No - copy requested - - - -  Would patient like information on creating a medical advance directive? - - Yes (Inpatient - patient defers creating a medical advance directive at this time - Information given) No - Patient declined -    Current Medications (verified) Outpatient Encounter Medications as of 04/17/2021  Medication Sig  . aspirin EC 81 MG tablet Take 81 mg by mouth daily.   Marland Kitchen b complex vitamins capsule Take 1 capsule by mouth daily at 6 PM.  . Cholecalciferol (VITAMIN D3) 50 MCG (2000 UT) capsule Take 2,000 Units by mouth daily at 6 PM.  . doxycycline (VIBRAMYCIN) 100 MG capsule Take 1 capsule (100 mg total) by mouth 2 (two) times daily.  Marland Kitchen latanoprost (XALATAN) 0.005 % ophthalmic solution Place 1 drop into both eyes at bedtime.  Marland Kitchen omeprazole (PRILOSEC) 10 MG capsule TAKE 1 CAPSULE BY MOUTH EVERY DAY (Patient taking differently: Take 20 mg by mouth daily at 6 PM.)  . SYNTHROID 137 MCG tablet TAKE 1 TABLET EVERY DAY (Patient taking differently: Take 137 mcg by mouth daily before breakfast. TAKE 1 TABLET EVERY DAY)  . timolol (TIMOPTIC) 0.5 % ophthalmic solution Place 1 drop  into both eyes 2 (two) times daily.  Marland Kitchen venlafaxine XR (EFFEXOR-XR) 37.5 MG 24 hr capsule TAKE 3 CAPSULES BY MOUTH EVERY DAY (Patient taking differently: Take 112.5 mg by mouth every evening.)  . zinc gluconate 50 MG tablet Take 50 mg by mouth daily at 6 PM.  . metroNIDAZOLE (METROGEL) 1 % gel Apply topically daily. (Patient taking differently: Apply 1 application topically at bedtime.)  . traMADol (ULTRAM) 50 MG tablet Take 1 tablet (50 mg total) by mouth every 6 (six) hours as needed for moderate pain or severe pain.  . TURMERIC PO Take 3,100 mg by mouth daily at 6 PM.   No facility-administered encounter medications on file as of 04/17/2021.    Allergies (verified) Crestor [rosuvastatin], Iodinated diagnostic agents, Sulfa antibiotics, and Sulfonamide derivatives   History: Past Medical History:  Diagnosis Date  . Anxiety   . Arthritis   . BPH (benign prostatic hypertrophy)   . CAD (coronary artery disease)    s/p STEMI 10/15/10 w/ Promus DES placed x1 in the first OM  . Colon polyps   . Complication of anesthesia    pt. reported 2015 penile inplant when getting some anesthesia had chest pain,they readjusted  the meds. and continued with surgery. Had no further problems di not have to follow up with anyone.  . Depression   . Depression with anxiety   . Dupuytren's contracture of hand   .  ED (erectile dysfunction)   . GERD (gastroesophageal reflux disease)   . Glaucoma   . HLD (hyperlipidemia)   . Hypocontractile bladder   . Hypothyroidism   . MI (myocardial infarction) ([place])    denies  . OSA on CPAP   . Peyronie's disease   . Rosacea   . Skin cancer    BCC   Past Surgical History:  Procedure Laterality Date  . CHOLECYSTECTOMY N/A 02/10/2021   Procedure: LAPAROSCOPIC CHOLECYSTECTOMY;  Surgeon: Coralie Keens, MD;  Location: [facility];  Service: General;  Laterality: N/A;  . COLONOSCOPY WITH ESOPHAGOGASTRODUODENOSCOPY (EGD)     multiple  . CORONARY STENT PLACEMENT    .  INNER EAR SURGERY    . LAPAROSCOPIC CHOLECYSTECTOMY Bilateral 02/09/2021   Dr.blackmon  . LASIK  02/1998  . PENILE PROSTHESIS IMPLANT    . TONSILECTOMY, ADENOIDECTOMY, BILATERAL MYRINGOTOMY AND TUBES    . TONSILLECTOMY    . VASECTOMY    . WISDOM TOOTH EXTRACTION     Family History  Problem Relation Age of Onset  . COPD Other   . COPD Mother   . Varicose Veins Father   . Liver cancer Father   . Basal cell carcinoma Father   . Basal cell carcinoma Sister   . Rheum arthritis Maternal Grandmother   . Heart failure Maternal Grandfather   . Colon cancer Paternal Grandmother   . Arthritis Paternal Grandfather   . Colon cancer Paternal Grandfather   . Melanoma Sister    Social History   Socioeconomic History  . Marital status: Divorced    Spouse name: Not on file  . Number of children: 2  . Years of education: Not on file  . Highest education level: Not on file  Occupational History  . Occupation: International aid/development worker    Comment: retired  Tobacco Use  . Smoking status: Never Smoker  . Smokeless tobacco: Never Used  Vaping Use  . Vaping Use: Never used  Substance and Sexual Activity  . Alcohol use: No  . Drug use: No  . Sexual activity: Not on file  Other Topics Concern  . Not on file  Social History Narrative   Divorced, 2 children   Retired Chief Financial Officer   No EtOH, tobacco, drugs   Social Determinants of Health   Financial Resource Strain: [place]   . Difficulty of Paying Living Expenses: Not hard at all  Food Insecurity: No Food Insecurity  . Worried About Charity fundraiser in the Last Year: Never true  . Ran Out of Food in the Last Year: Never true  Transportation Needs: No Transportation Needs  . Lack of Transportation (Medical): No  . Lack of Transportation (Non-Medical): No  Physical Activity: Insufficiently Active  . Days of Exercise per Week: 3 days  . Minutes of Exercise per Session: 30 min  Stress: No Stress Concern Present  . Feeling of  Stress : Not at all  Social Connections: Moderately Isolated  . Frequency of Communication with Friends and Family: More than three times a week  . Frequency of Social Gatherings with Friends and Family: More than three times a week  . Attends Religious Services: More than 4 times per year  . Active Member of Clubs or Organizations: No  . Attends Archivist Meetings: Never  . Marital Status: Divorced    Tobacco Counseling Counseling given: Not Answered   Clinical Intake:  Pre-visit preparation completed: Yes  Pain : No/denies pain  Nutritional Risks: None Diabetes: No  How often do you need to have someone help you when you read instructions, pamphlets, or other written materials from your doctor or pharmacy?: 1 - Never What is the last grade level you completed in school?: college  Diabetic?no  Interpreter Needed?: No  Information entered by :: [place] of Daily Living In your present state of health, do you have any difficulty performing the following activities: 04/17/2021 02/10/2021  Hearing? N Y  Vision? N N  Difficulty concentrating or making decisions? N N  Walking or climbing stairs? N N  Dressing or bathing? N N  Doing errands, shopping? N -  Preparing Food and eating ? N -  Using the Toilet? N -  In the past six months, have you accidently leaked urine? N -  Do you have problems with loss of bowel control? N -  Managing your Medications? N -  Managing your Finances? N -  Housekeeping or managing your Housekeeping? N -  Some recent data might be hidden    Patient Care Team: [name], MD as PCP - General (Family Medicine) Viona Gilmore, [facility] as Pharmacist (Pharmacist)  Indicate any recent Medical Services you may have received from other than [facility] providers in the past year (date may be approximate).     Assessment:   This is a routine wellness examination for [place].  Hearing/Vision screen  Hearing  Screening   '125Hz'$  $[place]'250Hz'mWEZx$'500Hz'$'1000Hz'$'2000Hz'$'3000Hz'$'4000Hz'$'6000Hz'$'8000Hz'$   Right ear:           Left ear:           Vision Screening Comments: Annual eye exam wears glasses  Dietary issues and exercise activities discussed: Current Exercise Habits: Home exercise routine, Type of exercise: walking, Time (Minutes): 30, Frequency (Times/Week): 3, Weekly Exercise (Minutes/Week): 90, Intensity: Mild  Goals Addressed            This Visit's Progress   . Weight Loss Achieved       Evidence-based guidance:   Review medication that may contribute to weight gain, such as corticosteroid, beta-blocker, tricyclic antidepressant, oral antihyperglycemic; advocate for changes when appropriate.   Perform or refer to registered dietitian to perform comprehensive nutrition assessment that includes disordered-eating behaviors, such as binge-eating, emotional or compulsive eating, grazing.   Counsel patient regarding health risks of obesity and that weight loss goal of 5 to 10 percent of initial weight will improve risk.   Recommend initial weight loss goal of 3 to 5 percent of bodyweight; increase weight-loss goals based on patient success as achieving greater weight loss continues to reduce risk.   Propose a calorie-reduced diet based on the patient's preferences and health status.   Provide ongoing emotional support or cognitive behavioral therapy and dietitian services (individual, group, virtual) over at least 6 months with a minimum of 14 encounters to best facilitate weight loss.   Provide monthly follow-up for 12 months when weight loss goal is met to assist with maintenance of weight loss.   Encourage increased physical activity or exercise based on individual age, risk, and ability up to 200 to 300 minutes per week that includes aerobic and resistance training.   Encourage reduction in sedentary behaviors by replacing them with nonexercise yet active leisure pursuits.   Identify physical  barriers, such as change in posture, balance, gait patterns, joint pain, and environmental barriers to activity.   Consider referral to rehabilitation therapy, especially when mobility or function  is impaired due to osteoarthritis and obesity.   Consider referral to weight-loss program that has published evidence of safety and efficacy if on-site intensive intervention is unavailable or patient preference.   Prepare patient for use of pharmacologic therapy as an adjunct to lifestyle changes based on body mass index, patient agreement and presence of risk factors or comorbidities.   Evaluate efficacy of pharmacologic therapy (weight loss) and tolerance to medication periodically.   Engage in shared decision-making regarding referral to bariatric surgeon for consultation and evaluation when weight-loss goal has not been accomplished by behavioral therapy with or without pharmacologic therapy.   Notes:       Depression Screen PHQ 2/9 Scores 04/17/2021 04/18/2020 01/29/2019 07/26/2018  PHQ - 2 Score 0 0 5 0  PHQ- 9 Score - - 18 -    Fall Risk Fall Risk  04/17/2021 07/22/2020 04/18/2020 07/26/2018  Falls in the past year? 0 0 0 No  Number falls in past yr: 0 - 0 -  Injury with Fall? 0 - 0 -  Follow up Falls evaluation completed - Falls evaluation completed -    FALL RISK PREVENTION PERTAINING TO THE HOME:  Any stairs in or around the home? Yes  If so, are there any without handrails? No  Home free of loose throw rugs in walkways, pet beds, electrical cords, etc? No  Adequate lighting in your home to reduce risk of falls? No   ASSISTIVE DEVICES UTILIZED TO PREVENT FALLS:  Life alert? No  Use of a cane, walker or w/c? Yes  Grab bars in the bathroom? Yes  Shower chair or bench in shower? Yes  Elevated toilet seat or a handicapped toilet? No   TIMED UP AND GO:  Was the test performed? Yes .  Length of time to ambulate 10 feet: 6 sec.   Gait steady and fast without use of assistive  device  Cognitive Function:     Normal cognitive status assessed by direct observation by this Nurse Health Advisor. No abnormalities found.     Immunizations Immunization History  Administered Date(s) Administered  . Fluad Quad(high Dose 65+) 08/10/2019  . Influenza, High Dose Seasonal PF 09/18/2020  . Influenza-Unspecified 09/13/2016, 10/04/2017, 07/28/2018, 09/18/2020  . PFIZER(Purple Top)SARS-COV-2 Vaccination 01/27/2020, 02/20/2020, 09/18/2020  . Pneumococcal Conjugate-13 06/16/2017, 06/16/2017  . Pneumococcal Polysaccharide-23 08/10/2019  . Tdap 08/21/2018  . Zoster Recombinat (Shingrix) 11/13/2019, 04/09/2020    TDAP status: Up to date  Flu Vaccine status: Up to date  Pneumococcal vaccine status: Up to date  Covid-19 vaccine status: Completed vaccines  Qualifies for Shingles Vaccine? Yes   Zostavax completed No   Shingrix Completed?: Yes  Screening Tests Health Maintenance  Topic Date Due  . COVID-19 Vaccine (4 - Booster for [place] series) 12/19/2020  . INFLUENZA VACCINE  07/06/2021  . COLONOSCOPY (Pts 45-60yrs Insurance coverage will need to be confirmed)  07/11/2028  . TETANUS/TDAP  08/21/2028  . Hepatitis C Screening  Completed  . PNA vac Low Risk Adult  Completed  . HPV VACCINES  Aged Out    Health Maintenance  Health Maintenance Due  Topic Date Due  . COVID-19 Vaccine (4 - Booster for Pfizer series) 12/19/2020    Colorectal cancer screening: Type of screening: Colonoscopy. Completed 07/11/2018. Repeat every 10 years  Lung Cancer Screening: (Low Dose CT Chest recommended if Age 82-80 years, 30 pack-year currently smoking OR have quit w/in 15years.) does not qualify.   Lung Cancer Screening Referral: n/a  Additional Screening:  Hepatitis C  Screening: does qualify; Completed 06/28/2020  Vision Screening: Recommended annual ophthalmology exams for early detection of glaucoma and other disorders of the eye. Is the patient up to date with their  annual eye exam?  Yes  Who is the provider or what is the name of the office in which the patient attends annual eye exams? Dr.Shaw If pt is not established with a provider, would they like to be referred to a provider to establish care? No .   Dental Screening: Recommended annual dental exams for proper oral hygiene  Community Resource Referral / Chronic Care Management: CRR required this visit?  No   CCM required this visit?  No      Plan:     I have personally reviewed and noted the following in the patient's chart:   . Medical and social history . Use of alcohol, tobacco or illicit drugs  . Current medications and supplements including opioid prescriptions. Patient is not currently taking opioid prescriptions. . Functional ability and status . Nutritional status . Physical activity . Advanced directives . List of other physicians . Hospitalizations, surgeries, and ER visits in previous 12 months . Vitals . Screenings to include cognitive, depression, and falls . Referrals and appointments  In addition, I have reviewed and discussed with patient certain preventive protocols, quality metrics, and best practice recommendations. A written personalized care plan for preventive services as well as general preventive health recommendations were provided to patient.     Randel Pigg, LPN   1/[id number]/[id number]   Nurse Notes: none

## 2021-04-17 ENCOUNTER — Other Ambulatory Visit: Payer: Self-pay

## 2021-04-17 ENCOUNTER — Ambulatory Visit (INDEPENDENT_AMBULATORY_CARE_PROVIDER_SITE_OTHER): Payer: Medicare Other

## 2021-04-17 ENCOUNTER — Telehealth: Payer: Self-pay | Admitting: Pharmacist

## 2021-04-17 VITALS — BP 122/72 | HR 70 | Temp 97.6°F | Ht 73.0 in | Wt 226.0 lb

## 2021-04-17 DIAGNOSIS — Z0000 Encounter for general adult medical examination without abnormal findings: Secondary | ICD-10-CM

## 2021-04-17 NOTE — Chronic Care Management (AMB) (Signed)
Chronic Care Management Pharmacy Assistant   Name: Matthew Pratt  MRN: [id number] DOB: May 11, 1951  Reason for Encounter: Disease State/ General Assessment Call.   Conditions to be addressed/monitored: CAD and HLD   Recent office visits:  None.   Recent consult visits:  None.   Hospital visits:  None in previous 6 months  Medications: Outpatient Encounter Medications as of 04/17/2021  Medication Sig  . aspirin EC 81 MG tablet Take 81 mg by mouth daily.   Marland Kitchen b complex vitamins capsule Take 1 capsule by mouth daily at 6 PM.  . Cholecalciferol (VITAMIN D3) 50 MCG (2000 UT) capsule Take 2,000 Units by mouth daily at 6 PM.  . doxycycline (VIBRAMYCIN) 100 MG capsule Take 1 capsule (100 mg total) by mouth 2 (two) times daily.  Marland Kitchen latanoprost (XALATAN) 0.005 % ophthalmic solution Place 1 drop into both eyes at bedtime.  . metroNIDAZOLE (METROGEL) 1 % gel Apply topically daily. (Patient taking differently: Apply 1 application topically at bedtime.)  . omeprazole (PRILOSEC) 10 MG capsule TAKE 1 CAPSULE BY MOUTH EVERY DAY (Patient taking differently: Take 20 mg by mouth daily at 6 PM.)  . SYNTHROID 137 MCG tablet TAKE 1 TABLET EVERY DAY (Patient taking differently: Take 137 mcg by mouth daily before breakfast. TAKE 1 TABLET EVERY DAY)  . timolol (TIMOPTIC) 0.5 % ophthalmic solution Place 1 drop into both eyes 2 (two) times daily.  . traMADol (ULTRAM) 50 MG tablet Take 1 tablet (50 mg total) by mouth every 6 (six) hours as needed for moderate pain or severe pain.  . TURMERIC PO Take 3,100 mg by mouth daily at 6 PM.  . venlafaxine XR (EFFEXOR-XR) 37.5 MG 24 hr capsule TAKE 3 CAPSULES BY MOUTH EVERY DAY (Patient taking differently: Take 112.5 mg by mouth every evening.)  . zinc gluconate 50 MG tablet Take 50 mg by mouth daily at 6 PM.   No facility-administered encounter medications on file as of 04/17/2021.    Comprehensive medication review performed; Spoke to patient regarding  cholesterol  Lipid Panel    Component Value Date/Time   CHOL 163 03/25/2021 0954   TRIG 101 03/25/2021 0954   HDL 59 03/25/2021 0954   LDLCALC 86 03/25/2021 0954    10-year ASCVD risk score: The ASCVD Risk score [name], et al., 2013) failed to calculate for the following reasons:   The patient has a prior MI or stroke diagnosis  . Current antihyperlipidemic regimen:   None. Patient recently sent mychart message stating he was having side effects from medication. See patient message on 03/12/21.   Marland Kitchen Previous antihyperlipidemic medications tried: Statins (LFT elevation)   . ASCVD risk enhancing conditions: age >51  . What recent interventions/DTPs have been made by any provider to improve Cholesterol control since last CPP Visit: Patient was taken off Repatha injection due to side effects.   . Any recent hospitalizations or ED visits since last visit with CPP? No  . What diet changes have been made to improve Cholesterol?  o Patient is eating a heart healthy diet. Patient said statins caused him too much pain and he has not been able to eat certain foods since his gallbladder surgery. Patient does eat salmon a few times a week with a vegetable.  . What exercise is being done to improve Cholesterol?  o Patient had gallbladder surgery in march and has been trying to slowly get back into walking and exercising. Patient does do stretches daily and his goal  is to go back to work soon and he has been doing some weight lifting at home to lose weight and keep his strength up. Patient is hopeful to get back to normal as the time goes on.  Adherence Review: Does the patient have >5 day gap between last estimated fill dates? No  Spoke with patient in detail and went over all medications and patient stated that he does not take the tramadol but has some left in case he needs it. Patient stated he does not feel he needs to be on any cholesterol medication because his numbers aren't even that  bad and his body just doesn't seem to agree well with cholesterol medications.   Star Rating Drugs:  None.   [facility] [phone]

## 2021-04-17 NOTE — Patient Instructions (Addendum)
Matthew Pratt , Thank you for taking time to come for your Medicare Wellness Visit. I appreciate your ongoing commitment to your health goals. Please review the following plan we discussed and let me know if I can assist you in the future.   Screening recommendations/referrals: Colonoscopy: current 07/11/2028 Recommended yearly ophthalmology/optometry visit for glaucoma screening and checkup Recommended yearly dental visit for hygiene and checkup  Vaccinations: Influenza vaccine: current due in fall 2022 Pneumococcal vaccine: completed series  Tdap vaccine: current due 08/21/2028 Shingles vaccine: completed series    Advanced directives: copies   Conditions/risks identified: none   Next appointment: 05/21/2021  CPE 130 pm With Dr. Elease Hashimoto  Preventive Care 65 Years and Older, Male Preventive care refers to lifestyle choices and visits with your health care provider that can promote health and wellness. What does preventive care include?  A yearly physical exam. This is also called an annual well check.  Dental exams once or twice a year.  Routine eye exams. Ask your health care provider how often you should have your eyes checked.  Personal lifestyle choices, including:  Daily care of your teeth and gums.  Regular physical activity.  Eating a healthy diet.  Avoiding tobacco and drug use.  Limiting alcohol use.  Practicing safe sex.  Taking low doses of aspirin every day.  Taking vitamin and mineral supplements as recommended by your health care provider. What happens during an annual well check? The services and screenings done by your health care provider during your annual well check will depend on your age, overall health, lifestyle risk factors, and family history of disease. Counseling  Your health care provider may ask you questions about your:  Alcohol use.  Tobacco use.  Drug use.  Emotional well-being.  Home and relationship well-being.  Sexual  activity.  Eating habits.  History of falls.  Memory and ability to understand (cognition).  Work and work Statistician. Screening  You may have the following tests or measurements:  Height, weight, and BMI.  Blood pressure.  Lipid and cholesterol levels. These may be checked every 5 years, or more frequently if you are over 72 years old.  Skin check.  Lung cancer screening. You may have this screening every year starting at age 72 if you have a 30-pack-year history of smoking and currently smoke or have quit within the past 15 years.  Fecal occult blood test (FOBT) of the stool. You may have this test every year starting at age 21.  Flexible sigmoidoscopy or colonoscopy. You may have a sigmoidoscopy every 5 years or a colonoscopy every 10 years starting at age 72.  Prostate cancer screening. Recommendations will vary depending on your family history and other risks.  Hepatitis C blood test.  Hepatitis B blood test.  Sexually transmitted disease (STD) testing.  Diabetes screening. This is done by checking your blood sugar (glucose) after you have not eaten for a while (fasting). You may have this done every 1-3 years.  Abdominal aortic aneurysm (AAA) screening. You may need this if you are a current or former smoker.  Osteoporosis. You may be screened starting at age 72 if you are at high risk. Talk with your health care provider about your test results, treatment options, and if necessary, the need for more tests. Vaccines  Your health care provider may recommend certain vaccines, such as:  Influenza vaccine. This is recommended every year.  Tetanus, diphtheria, and acellular pertussis (Tdap, Td) vaccine. You may need a Td booster every 10  years.  Zoster vaccine. You may need this after age 72.  Pneumococcal 13-valent conjugate (PCV13) vaccine. One dose is recommended after age 72.  Pneumococcal polysaccharide (PPSV23) vaccine. One dose is recommended after age  72. Talk to your health care provider about which screenings and vaccines you need and how often you need them. This information is not intended to replace advice given to you by your health care provider. Make sure you discuss any questions you have with your health care provider. Document Released: 12/19/2015 Document Revised: 08/11/2016 Document Reviewed: 09/23/2015 Elsevier Interactive Patient Education  2017 [place] Prevention in the Home Falls can cause injuries. They can happen to people of all ages. There are many things you can do to make your home safe and to help prevent falls. What can I do on the outside of my home?  Regularly fix the edges of walkways and driveways and fix any cracks.  Remove anything that might make you trip as you walk through a door, such as a raised step or threshold.  Trim any bushes or trees on the path to your home.  Use bright outdoor lighting.  Clear any walking paths of anything that might make someone trip, such as rocks or tools.  Regularly check to see if handrails are loose or broken. Make sure that both sides of any steps have handrails.  Any raised decks and porches should have guardrails on the edges.  Have any leaves, snow, or ice cleared regularly.  Use sand or salt on walking paths during winter.  Clean up any spills in your garage right away. This includes oil or grease spills. What can I do in the bathroom?  Use night lights.  Install grab bars by the toilet and in the tub and shower. Do not use towel bars as grab bars.  Use non-skid mats or decals in the tub or shower.  If you need to sit down in the shower, use a plastic, non-slip stool.  Keep the floor dry. Clean up any water that spills on the floor as soon as it happens.  Remove soap buildup in the tub or shower regularly.  Attach bath mats securely with double-sided non-slip rug tape.  Do not have throw rugs and other things on the floor that can make  you trip. What can I do in the bedroom?  Use night lights.  Make sure that you have a light by your bed that is easy to reach.  Do not use any sheets or blankets that are too big for your bed. They should not hang down onto the floor.  Have a firm chair that has side arms. You can use this for support while you get dressed.  Do not have throw rugs and other things on the floor that can make you trip. What can I do in the kitchen?  Clean up any spills right away.  Avoid walking on wet floors.  Keep items that you use a lot in easy-to-reach places.  If you need to reach something above you, use a strong step stool that has a grab bar.  Keep electrical cords out of the way.  Do not use floor polish or wax that makes floors slippery. If you must use wax, use non-skid floor wax.  Do not have throw rugs and other things on the floor that can make you trip. What can I do with my stairs?  Do not leave any items on the stairs.  Make sure that  there are handrails on both sides of the stairs and use them. Fix handrails that are broken or loose. Make sure that handrails are as long as the stairways.  Check any carpeting to make sure that it is firmly attached to the stairs. Fix any carpet that is loose or worn.  Avoid having throw rugs at the top or bottom of the stairs. If you do have throw rugs, attach them to the floor with carpet tape.  Make sure that you have a light switch at the top of the stairs and the bottom of the stairs. If you do not have them, ask someone to add them for you. What else can I do to help prevent falls?  Wear shoes that:  Do not have high heels.  Have rubber bottoms.  Are comfortable and fit you well.  Are closed at the toe. Do not wear sandals.  If you use a stepladder:  Make sure that it is fully opened. Do not climb a closed stepladder.  Make sure that both sides of the stepladder are locked into place.  Ask someone to hold it for you, if  possible.  Clearly mark and make sure that you can see:  Any grab bars or handrails.  First and last steps.  Where the edge of each step is.  Use tools that help you move around (mobility aids) if they are needed. These include:  Canes.  Walkers.  Scooters.  Crutches.  Turn on the lights when you go into a dark area. Replace any light bulbs as soon as they burn out.  Set up your furniture so you have a clear path. Avoid moving your furniture around.  If any of your floors are uneven, fix them.  If there are any pets around you, be aware of where they are.  Review your medicines with your doctor. Some medicines can make you feel dizzy. This can increase your chance of falling. Ask your doctor what other things that you can do to help prevent falls. This information is not intended to replace advice given to you by your health care provider. Make sure you discuss any questions you have with your health care provider. Document Released: 09/18/2009 Document Revised: 04/29/2016 Document Reviewed: 12/27/2014 Elsevier Interactive Patient Education  2017 [employer].

## 2021-04-27 ENCOUNTER — Encounter: Payer: Self-pay | Admitting: Family Medicine

## 2021-04-28 ENCOUNTER — Other Ambulatory Visit: Payer: Self-pay

## 2021-04-29 ENCOUNTER — Ambulatory Visit (INDEPENDENT_AMBULATORY_CARE_PROVIDER_SITE_OTHER): Payer: Medicare Other | Admitting: Family Medicine

## 2021-04-29 ENCOUNTER — Encounter: Payer: Self-pay | Admitting: Family Medicine

## 2021-04-29 VITALS — BP 124/64 | HR 57 | Temp 97.9°F | Wt 225.5 lb

## 2021-04-29 DIAGNOSIS — R739 Hyperglycemia, unspecified: Secondary | ICD-10-CM | POA: Diagnosis not present

## 2021-04-29 LAB — POCT GLYCOSYLATED HEMOGLOBIN (HGB A1C): Hemoglobin A1C: 5.4 % (ref 4.0–5.6)

## 2021-04-29 NOTE — Patient Instructions (Signed)
Hyperglycemia Hyperglycemia occurs when the level of sugar (glucose) in the blood is too high. Glucose is a type of sugar that provides the body's main source of energy. Certain hormones (insulin and glucagon) control the level of glucose in the blood. Insulin lowers blood glucose, and glucagon increases blood glucose. Hyperglycemia can result from not having enough insulin in the bloodstream, or from the body not responding normally to insulin. Hyperglycemia occurs most often in people who have diabetes (diabetes mellitus), but it can happen in people who do not have diabetes. It can develop quickly, and it can be life-threatening if it causes you to become severely dehydrated (diabetic ketoacidosis or hyperglycemic hyperosmolar state). Severe hyperglycemia is a medical emergency. For most people with diabetes, a blood glucose level above 240 mg/dL is considered hyperglycemia. What are the causes? If you have diabetes, hyperglycemia may be caused by:  Medicines that increase blood glucose or affect your diabetes control.  Getting less physical activity.  Eating more than planned.  Being sick or injured, having an infection, or having surgery.  Stress.  Not giving yourself enough insulin (if you are taking insulin). If you have undiagnosed diabetes, this may be the reason you have hyperglycemia. If you do not have diabetes, hyperglycemia may be caused by:  Certain medicines, including: ? Steroid medicines. ? Beta-blockers. ? Epinephrine. ? Thiazide diuretics.  Stress.  Having a serious illness, an infection, or surgery.  Diseases of the pancreas. What increases the risk? Hyperglycemia is more likely to develop in people who have risk factors for diabetes, such as:  Having a family member with diabetes.  Certain conditions in which the body's disease-fighting system (immune system) attacks itself (autoimmune disorders).  Being overweight or obese.  Having an inactive  (sedentary) lifestyle.  Having been diagnosed with insulin resistance.  Having a history of prediabetes, gestational diabetes, or polycystic ovarian syndrome (PCOS). What are the signs or symptoms? Hyperglycemia may not cause any symptoms. If you do have symptoms, they may include:  Increased thirst.  Needing to urinate more often than usual.  Hunger.  Feeling very tired.  Blurry vision. Other symptoms may develop if hyperglycemia gets worse, such as:  Dry mouth.  Abdominal pain.  Loss of appetite.  Fruity-smelling breath.  Weakness.  Unexpected weight loss.  Tingling or numbness in the hands or feet.  Headache.  Cuts or bruises that are slow to heal. How is this diagnosed? Hyperglycemia is diagnosed with a blood test to measure your blood glucose level. This blood test is usually done while you are having symptoms. Your health care provider may also do a physical exam and review your medical history. You may have more tests to determine the cause of your hyperglycemia, such as:  A fasting blood glucose (FBG) test. You will not be allowed to eat (you will fast) for at least 8 hours before a blood sample is taken.  An A1C blood test. This provides information about blood glucose control over the previous 2-3 months.  An oral glucose tolerance test (OGTT). This measures your blood glucose at two times: ? After fasting. This is your baseline blood glucose level. ? 2 hours after drinking a beverage that contains glucose. How is this treated? Treatment depends on the cause of your hyperglycemia. Treatment may include:  Taking medicine to regulate your blood glucose levels. If you take insulin or other diabetes medicines, your medicine or dosage may be adjusted.  Lifestyle changes, such as exercising more, eating healthier foods, or losing  weight.  Treating an illness or infection.  Checking your blood glucose more often.  Stopping or reducing steroid  medicines. If your hyperglycemia becomes severe and it results in diabetic ketoacidosis or hyperglycemic hyperosmolar state, you must be hospitalized and given IV fluids and IV insulin. Follow these instructions at home: General instructions  Take over-the-counter and prescription medicines only as told by your health care provider.  Do not use any products that contain nicotine or tobacco. These products include cigarettes, chewing tobacco, and vaping devices, such as e-cigarettes. If you need help quitting, ask your health care provider.  If you drink alcohol: ? Limit how much you have to:  0-1 drink a day for women who are not pregnant.  0-2 drinks a day for men. ? Know how much alcohol is in a drink. In the [place] one drink equals one 12 oz bottle of beer (355 mL), one 5 oz glass of wine (148 mL), or one 1 oz glass of hard liquor (44 mL).  Learn to manage stress. If you need help with this, ask your health care provider.  Do exercises as told by your health care provider.  Keep all follow-up visits. This is important. Eating and drinking  Maintain a healthy weight.  Stay hydrated, especially when you exercise, get sick, or spend time in hot temperatures.  Drink enough fluid to keep your urine pale yellow.   If you have diabetes:  Know the symptoms of hyperglycemia.  Follow your diabetes management plan as told by your health care provider. Make sure you: ? Take your insulin and medicines as told. ? Follow your exercise plan. ? Follow your meal plan. Eat on time, and do not skip meals. ? Check your blood glucose as often as told. Make sure to check your blood glucose before and after exercise. If you exercise longer or in a different way, check your blood glucose more often. ? Follow your sick day plan whenever you cannot eat or drink normally. Make this plan in advance with your health care provider.  Share your diabetes management plan with people in your workplace,  school, and household.  Check your urine for ketones when you are ill and as told by your health care provider.  Carry a medical alert card or wear medical alert jewelry.   Where to find more information American Diabetes Association: www.diabetes.org Contact a health care provider if:  Your blood glucose is at or above 240 mg/dL (13.3 mmol/L) for 2 days in a row.  You have problems keeping your blood glucose in your target range.  You have frequent episodes of hyperglycemia.  You have signs of illness, such as nausea, vomiting, or fever. Get help right away if:  Your blood glucose monitor reads "high" even when you are taking insulin.  You have trouble breathing.  You have a change in how you think, feel, or act (mental status).  You have nausea or vomiting that does not go away. These symptoms may represent a serious problem that is an emergency. Do not wait to see if the symptoms will go away. Get medical help right away. Call your local emergency services (911 in the [place] Do not drive yourself to the hospital. Summary  Hyperglycemia occurs when the level of sugar (glucose) in the blood is too high.  Hyperglycemia can happen with or without diabetes, and severe hyperglycemia can be life-threatening.  Hyperglycemia is diagnosed with a blood test to measure your blood glucose level. This blood test  is usually done while you are having symptoms. Your health care provider may also do a physical exam and review your medical history.  If you have diabetes, follow your diabetes management plan as told by your health care provider.  Contact your health care provider if you have problems keeping your blood glucose in your target range. This information is not intended to replace advice given to you by your health care provider. Make sure you discuss any questions you have with your health care provider. Document Revised: 09/05/2020 Document Reviewed: 09/05/2020 Elsevier Patient  Education  2021 [employer].

## 2021-04-29 NOTE — Progress Notes (Signed)
Established Patient Office Visit  Subjective:  Patient ID: Matthew Pratt, male    DOB: 05-29-1951  Age: 70 y.o. MRN: [id number]  CC:  Chief Complaint  Patient presents with  . Weight Loss    Trying to loose weight and has been unsuccessful, would like to discuss options to help, was also told that his recent issues with his gallbladder could be due to diabetes and would like labs done    HPI  Matthew Pratt presents for concerns regarding elevated glucose in the past and difficulty losing weight.  He is currently exercising with recumbent bike daily burning 300- 320 cal/day He is trying to scale that carb intake but still consuming about 1800 cal/day.  He started this few weeks ago and has not seen any weight loss yet.  Recent fasting glucoses have been around 90.  He has had multiple previous glucoses in the prediabetic range.  No polyuria or polydipsia.  No family history of type 2 diabetes.  His weight is actually down a couple pounds from last visit here in February  Past Medical History:  Diagnosis Date  . Anxiety   . Arthritis   . BPH (benign prostatic hypertrophy)   . CAD (coronary artery disease)    s/p STEMI 10/15/10 w/ Promus DES placed x1 in the first OM  . Colon polyps   . Complication of anesthesia    pt. reported 2015 penile inplant when getting some anesthesia had chest pain,they readjusted  the meds. and continued with surgery. Had no further problems di not have to follow up with anyone.  . Depression   . Depression with anxiety   . Dupuytren's contracture of hand   . ED (erectile dysfunction)   . GERD (gastroesophageal reflux disease)   . Glaucoma   . HLD (hyperlipidemia)   . Hypocontractile bladder   . Hypothyroidism   . MI (myocardial infarction) ([place])    denies  . OSA on CPAP   . Peyronie's disease   . Rosacea   . Skin cancer    BCC    Past Surgical History:  Procedure Laterality Date  . CHOLECYSTECTOMY N/A 02/10/2021   Procedure:  LAPAROSCOPIC CHOLECYSTECTOMY;  Surgeon: Coralie Keens, MD;  Location: [facility];  Service: General;  Laterality: N/A;  . COLONOSCOPY WITH ESOPHAGOGASTRODUODENOSCOPY (EGD)     multiple  . CORONARY STENT PLACEMENT    . INNER EAR SURGERY    . LAPAROSCOPIC CHOLECYSTECTOMY Bilateral 02/09/2021   Dr.blackmon  . LASIK  02/1998  . PENILE PROSTHESIS IMPLANT    . TONSILECTOMY, ADENOIDECTOMY, BILATERAL MYRINGOTOMY AND TUBES    . TONSILLECTOMY    . VASECTOMY    . WISDOM TOOTH EXTRACTION      Family History  Problem Relation Age of Onset  . COPD Other   . COPD Mother   . Varicose Veins Father   . Liver cancer Father   . Basal cell carcinoma Father   . Basal cell carcinoma Sister   . Rheum arthritis Maternal Grandmother   . Heart failure Maternal Grandfather   . Colon cancer Paternal Grandmother   . Arthritis Paternal Grandfather   . Colon cancer Paternal Grandfather   . Melanoma Sister     Social History   Socioeconomic History  . Marital status: Divorced    Spouse name: Not on file  . Number of children: 2  . Years of education: Not on file  . Highest education level: Not on file  Occupational History  . Occupation:  technical specialist -  engineer    Comment: retired  Tobacco Use  . Smoking status: Never Smoker  . Smokeless tobacco: Never Used  Vaping Use  . Vaping Use: Never used  Substance and Sexual Activity  . Alcohol use: No  . Drug use: No  . Sexual activity: Not on file  Other Topics Concern  . Not on file  Social History Narrative   Divorced, 2 children   Retired Chief Financial Officer   No EtOH, tobacco, drugs   Social Determinants of Health   Financial Resource Strain: [place]   . Difficulty of Paying Living Expenses: Not hard at all  Food Insecurity: No Food Insecurity  . Worried About Charity fundraiser in the Last Year: Never true  . Ran Out of Food in the Last Year: Never true  Transportation Needs: No Transportation Needs  . Lack of Transportation (Medical):  No  . Lack of Transportation (Non-Medical): No  Physical Activity: Insufficiently Active  . Days of Exercise per Week: 3 days  . Minutes of Exercise per Session: 30 min  Stress: No Stress Concern Present  . Feeling of Stress : Not at all  Social Connections: Moderately Isolated  . Frequency of Communication with Friends and Family: More than three times a week  . Frequency of Social Gatherings with Friends and Family: More than three times a week  . Attends Religious Services: More than 4 times per year  . Active Member of Clubs or Organizations: No  . Attends Archivist Meetings: Never  . Marital Status: Divorced  Human resources officer Violence: Not At Risk  . Fear of Current or Ex-Partner: No  . Emotionally Abused: No  . Physically Abused: No  . Sexually Abused: No    Outpatient Medications Prior to Visit  Medication Sig Dispense Refill  . aspirin EC 81 MG tablet Take 81 mg by mouth daily.     Marland Kitchen b complex vitamins capsule Take 1 capsule by mouth daily at 6 PM.    . Cholecalciferol (VITAMIN D3) 50 MCG (2000 UT) capsule Take 2,000 Units by mouth daily at 6 PM.    . latanoprost (XALATAN) 0.005 % ophthalmic solution Place 1 drop into both eyes at bedtime.    Marland Kitchen omeprazole (PRILOSEC) 10 MG capsule TAKE 1 CAPSULE BY MOUTH EVERY DAY (Patient taking differently: Take 20 mg by mouth daily at 6 PM.) 90 capsule 3  . SYNTHROID 137 MCG tablet TAKE 1 TABLET EVERY DAY (Patient taking differently: Take 137 mcg by mouth daily before breakfast. TAKE 1 TABLET EVERY DAY) 90 tablet 1  . timolol (TIMOPTIC) 0.5 % ophthalmic solution Place 1 drop into both eyes 2 (two) times daily.    Marland Kitchen venlafaxine XR (EFFEXOR-XR) 37.5 MG 24 hr capsule TAKE 3 CAPSULES BY MOUTH EVERY DAY (Patient taking differently: Take 112.5 mg by mouth every evening.) 270 capsule 3  . zinc gluconate 50 MG tablet Take 50 mg by mouth daily at 6 PM.    . doxycycline (VIBRAMYCIN) 100 MG capsule Take 1 capsule (100 mg total) by mouth 2  (two) times daily. 20 capsule 0  . metroNIDAZOLE (METROGEL) 1 % gel Apply topically daily. (Patient taking differently: Apply 1 application topically at bedtime.) 45 g 5  . traMADol (ULTRAM) 50 MG tablet Take 1 tablet (50 mg total) by mouth every 6 (six) hours as needed for moderate pain or severe pain. 20 tablet 0  . TURMERIC PO Take 3,100 mg by mouth daily at 6 PM.  No facility-administered medications prior to visit.    Allergies  Allergen Reactions  . Crestor [Rosuvastatin] Other (See Comments)    Myalgia.    . Iodinated Diagnostic Agents Hives and Itching  . Sulfa Antibiotics Rash  . Sulfonamide Derivatives Rash    REACTION: rash    ROS Review of Systems  Constitutional: Negative for appetite change and unexpected weight change.  Respiratory: Negative for shortness of breath.   Cardiovascular: Negative for chest pain.  Endocrine: Negative for polydipsia and polyuria.  Genitourinary: Negative for dysuria.      Objective:    Physical Exam Vitals reviewed.  Constitutional:      Appearance: Normal appearance.  Cardiovascular:     Rate and Rhythm: Normal rate and regular rhythm.  Pulmonary:     Effort: Pulmonary effort is normal.     Breath sounds: Normal breath sounds.  Neurological:     Mental Status: He is alert.     BP 124/64 (BP Location: Left Arm, Patient Position: Sitting, Cuff Size: Normal)   Pulse (!) 57   Temp 97.9 F (36.6 C) (Oral)   Wt 225 lb 8 oz (102.3 kg)   SpO2 97%   BMI 29.75 kg/m  Wt Readings from Last 3 Encounters:  04/29/21 225 lb 8 oz (102.3 kg)  04/17/21 226 lb (102.5 kg)  02/10/21 225 lb (102.1 kg)     There are no preventive care reminders to display for this patient.  There are no preventive care reminders to display for this patient.  Lab Results  Component Value Date   TSH 0.44 02/19/2021   Lab Results  Component Value Date   WBC 4.3 02/10/2021   HGB 14.0 02/10/2021   HCT 40.4 02/10/2021   MCV 101.0 (H)  02/10/2021   PLT 203 02/10/2021   Lab Results  Component Value Date   NA 138 02/10/2021   K 4.2 02/10/2021   CO2 26 02/10/2021   GLUCOSE 102 (H) 02/10/2021   BUN 17 02/10/2021   CREATININE 1.12 02/10/2021   BILITOT 1.0 03/25/2021   ALKPHOS 79 03/25/2021   AST 28 03/25/2021   ALT 33 03/25/2021   PROT 7.0 03/25/2021   ALBUMIN 4.6 03/25/2021   CALCIUM 8.9 02/10/2021   ANIONGAP 7 02/10/2021   GFR 69.70 11/10/2020   Lab Results  Component Value Date   CHOL 163 03/25/2021   Lab Results  Component Value Date   HDL 59 03/25/2021   Lab Results  Component Value Date   LDLCALC 86 03/25/2021   Lab Results  Component Value Date   TRIG 101 03/25/2021   Lab Results  Component Value Date   CHOLHDL 2.8 03/25/2021   Lab Results  Component Value Date   HGBA1C 5.4 04/29/2021      Assessment & Plan:   #1 overweight with past history of hyperglycemia/prediabetes range blood sugars.  A1c today 5.4% which is actually down from 5.6% July 2021.  Patient reassured he has no evidence for type 2 diabetes at this time.  -We have recommended low glycemic diet and gave him some dietary suggestions. -Continue regular exercise habits and discussed value of resistance in addition to his aerobic exercise -Consider 40-month follow-up. -We did mention home weight loss clinic but he would like to try to give this some time to do some things on his own first  No orders of the defined types were placed in this encounter.   Follow-up: No follow-ups on file.    [name], MD

## 2021-05-18 ENCOUNTER — Ambulatory Visit (INDEPENDENT_AMBULATORY_CARE_PROVIDER_SITE_OTHER): Payer: Medicare Other | Admitting: Family Medicine

## 2021-05-18 ENCOUNTER — Other Ambulatory Visit: Payer: Self-pay

## 2021-05-18 ENCOUNTER — Encounter: Payer: Self-pay | Admitting: Family Medicine

## 2021-05-18 VITALS — BP 118/60 | HR 58 | Temp 98.0°F | Ht 73.0 in | Wt 220.7 lb

## 2021-05-18 DIAGNOSIS — Z0000 Encounter for general adult medical examination without abnormal findings: Secondary | ICD-10-CM | POA: Diagnosis not present

## 2021-05-18 NOTE — Patient Instructions (Signed)
Consider increasing the Vit D to 2,000 IU per day.

## 2021-05-18 NOTE — Progress Notes (Signed)
Established Patient Office Visit  Subjective:  Patient ID: Matthew Pratt, male    DOB: Nov 03, 1951  Age: 70 y.o. MRN: [id number]  CC:  Chief Complaint  Patient presents with   Annual Exam    No new concerns    HPI Matthew Pratt presents for physical exam.  He states he has felt very well since getting his gallbladder out last year.  He is also exercising daily and has build up to where he is burning up to 700 cal/day with his recumbent cycle.  His goal is 300 to 700 cal/day.  He has lost 5 pounds and feels his stamina is improved.  Health maintenance reviewed.  Previous hepatitis C screen negative.  Shingles vaccine complete.  Pneumonia vaccines complete.  Tetanus due 2029.  Colonoscopy due 2029.  He is up-to-date with regard to flu vaccines  Family history-his father apparently had gallbladder cancer.  Sister with IBS.  Mother with COPD and hypothyroidism  Social history-he was married twice first marriage 65 years and had 2 sons from whom he is estranged.  Second wife was married 33 years.  She has Alzheimer's disease.  They have never smoked.  Rare alcohol.  Retired from Emergency planning/management officer.  Considering going back and working part-time.  Past Medical History:  Diagnosis Date   Anxiety    Arthritis    BPH (benign prostatic hypertrophy)    CAD (coronary artery disease)    s/p STEMI 10/15/10 w/ Promus DES placed x1 in the first OM   Colon polyps    Complication of anesthesia    pt. reported 2015 penile inplant when getting some anesthesia had chest pain,they readjusted  the meds. and continued with surgery. Had no further problems di not have to follow up with anyone.   Depression    Depression with anxiety    Dupuytren's contracture of hand    ED (erectile dysfunction)    GERD (gastroesophageal reflux disease)    Glaucoma    HLD (hyperlipidemia)    Hypocontractile bladder    Hypothyroidism    MI (myocardial infarction) ([place])    denies   OSA on CPAP     Peyronie's disease    Rosacea    Skin cancer    BCC    Past Surgical History:  Procedure Laterality Date   CHOLECYSTECTOMY N/A 02/10/2021   Procedure: LAPAROSCOPIC CHOLECYSTECTOMY;  Surgeon: Coralie Keens, MD;  Location: [facility];  Service: General;  Laterality: N/A;   COLONOSCOPY WITH ESOPHAGOGASTRODUODENOSCOPY (EGD)     multiple   CORONARY STENT PLACEMENT     INNER EAR SURGERY     LAPAROSCOPIC CHOLECYSTECTOMY Bilateral 02/09/2021   Dr.blackmon   LASIK  02/1998   PENILE PROSTHESIS IMPLANT     TONSILECTOMY, ADENOIDECTOMY, BILATERAL MYRINGOTOMY AND TUBES     TONSILLECTOMY     VASECTOMY     WISDOM TOOTH EXTRACTION      Family History  Problem Relation Age of Onset   COPD Mother    Cancer Father        gallbladder cancer   Varicose Veins Father    Liver cancer Father    Basal cell carcinoma Father    Basal cell carcinoma Sister    Melanoma Sister    Rheum arthritis Maternal Grandmother    Heart failure Maternal Grandfather    Colon cancer Paternal Grandmother    Arthritis Paternal Grandfather    Colon cancer Paternal Grandfather    COPD Other     Social History  Socioeconomic History   Marital status: Divorced    Spouse name: Not on file   Number of children: 2   Years of education: Not on file   Highest education level: Not on file  Occupational History   Occupation: International aid/development worker    Comment: retired  Tobacco Use   Smoking status: Never   Smokeless tobacco: Never  Vaping Use   Vaping Use: Never used  Substance and Sexual Activity   Alcohol use: No   Drug use: No   Sexual activity: Not on file  Other Topics Concern   Not on file  Social History Narrative   Divorced, 2 children   Retired Chief Financial Officer   No EtOH, tobacco, drugs   Social Determinants of Radio broadcast assistant Strain: Low Risk    Difficulty of Paying Living Expenses: Not hard at all  Food Insecurity: No Food Insecurity   Worried About Charity fundraiser in the  Last Year: Never true   Arboriculturist in the Last Year: Never true  Transportation Needs: No Transportation Needs   Lack of Transportation (Medical): No   Lack of Transportation (Non-Medical): No  Physical Activity: Insufficiently Active   Days of Exercise per Week: 3 days   Minutes of Exercise per Session: 30 min  Stress: No Stress Concern Present   Feeling of Stress : Not at all  Social Connections: Moderately Isolated   Frequency of Communication with Friends and Family: More than three times a week   Frequency of Social Gatherings with Friends and Family: More than three times a week   Attends Religious Services: More than 4 times per year   Active Member of [employer] or Organizations: No   Attends Archivist Meetings: Never   Marital Status: Divorced  Human resources officer Violence: Not At Risk   Fear of Current or Ex-Partner: No   Emotionally Abused: No   Physically Abused: No   Sexually Abused: No    Outpatient Medications Prior to Visit  Medication Sig Dispense Refill   aspirin EC 81 MG tablet Take 81 mg by mouth daily.      b complex vitamins capsule Take 1 capsule by mouth daily at 6 PM.     Cholecalciferol (VITAMIN D3) 50 MCG (2000 UT) capsule Take 2,000 Units by mouth daily at 6 PM.     latanoprost (XALATAN) 0.005 % ophthalmic solution Place 1 drop into both eyes at bedtime.     omeprazole (PRILOSEC) 10 MG capsule TAKE 1 CAPSULE BY MOUTH EVERY DAY (Patient taking differently: Take 20 mg by mouth daily at 6 PM.) 90 capsule 3   SYNTHROID 137 MCG tablet TAKE 1 TABLET EVERY DAY (Patient taking differently: Take 137 mcg by mouth daily before breakfast. TAKE 1 TABLET EVERY DAY) 90 tablet 1   timolol (TIMOPTIC) 0.5 % ophthalmic solution Place 1 drop into both eyes 2 (two) times daily.     venlafaxine XR (EFFEXOR-XR) 37.5 MG 24 hr capsule TAKE 3 CAPSULES BY MOUTH EVERY DAY (Patient taking differently: Take 112.5 mg by mouth every evening.) 270 capsule 3   zinc gluconate 50 MG  tablet Take 50 mg by mouth daily at 6 PM.     No facility-administered medications prior to visit.    Allergies  Allergen Reactions   Crestor [Rosuvastatin] Other (See Comments)    Myalgia.     Iodinated Diagnostic Agents Hives and Itching   Sulfa Antibiotics Rash   Sulfonamide Derivatives Rash  REACTION: rash    ROS Review of Systems  Constitutional:  Negative for activity change, appetite change, fatigue and fever.  HENT:  Negative for congestion, ear pain and trouble swallowing.   Eyes:  Negative for pain and visual disturbance.  Respiratory:  Negative for cough, shortness of breath and wheezing.   Cardiovascular:  Negative for chest pain and palpitations.  Gastrointestinal:  Negative for abdominal distention, abdominal pain, blood in stool, constipation, diarrhea, nausea, rectal pain and vomiting.  Genitourinary:  Negative for dysuria, hematuria and testicular pain.  Musculoskeletal:  Negative for arthralgias and joint swelling.  Skin:  Negative for rash.  Neurological:  Negative for dizziness, syncope and headaches.  Hematological:  Negative for adenopathy.  Psychiatric/Behavioral:  Negative for confusion and dysphoric mood.      Objective:    Physical Exam Constitutional:      General: He is not in acute distress.    Appearance: He is well-developed.  HENT:     Head: Normocephalic and atraumatic.     Right Ear: External ear normal.     Left Ear: External ear normal.  Eyes:     Conjunctiva/sclera: Conjunctivae normal.     Pupils: Pupils are equal, round, and reactive to light.  Neck:     Thyroid: No thyromegaly.  Cardiovascular:     Rate and Rhythm: Normal rate and regular rhythm.     Heart sounds: Normal heart sounds. No murmur heard. Pulmonary:     Effort: No respiratory distress.     Breath sounds: No wheezing or rales.  Abdominal:     General: Bowel sounds are normal. There is no distension.     Palpations: Abdomen is soft. There is no mass.      Tenderness: There is no abdominal tenderness. There is no guarding or rebound.  Musculoskeletal:     Cervical back: Normal range of motion and neck supple.  Lymphadenopathy:     Cervical: No cervical adenopathy.  Skin:    Findings: No rash.  Neurological:     Mental Status: He is alert and oriented to person, place, and time.     Cranial Nerves: No cranial nerve deficit.     Deep Tendon Reflexes: Reflexes normal.    BP 118/60 (BP Location: Left Arm, Patient Position: Sitting, Cuff Size: Normal)   Pulse (!) 58   Temp 98 F (36.7 C) (Oral)   Ht 6\' 1"  (1.854 m)   Wt 220 lb 11.2 oz (100.1 kg)   SpO2 96%   BMI 29.12 kg/m  Wt Readings from Last 3 Encounters:  05/18/21 220 lb 11.2 oz (100.1 kg)  04/29/21 225 lb 8 oz (102.3 kg)  04/17/21 226 lb (102.5 kg)     There are no preventive care reminders to display for this patient.  There are no preventive care reminders to display for this patient.  Lab Results  Component Value Date   TSH 0.44 02/19/2021   Lab Results  Component Value Date   WBC 4.3 02/10/2021   HGB 14.0 02/10/2021   HCT 40.4 02/10/2021   MCV 101.0 (H) 02/10/2021   PLT 203 02/10/2021   Lab Results  Component Value Date   NA 138 02/10/2021   K 4.2 02/10/2021   CO2 26 02/10/2021   GLUCOSE 102 (H) 02/10/2021   BUN 17 02/10/2021   CREATININE 1.12 02/10/2021   BILITOT 1.0 03/25/2021   ALKPHOS 79 03/25/2021   AST 28 03/25/2021   ALT 33 03/25/2021   PROT 7.0 03/25/2021  ALBUMIN 4.6 03/25/2021   CALCIUM 8.9 02/10/2021   ANIONGAP 7 02/10/2021   GFR 69.70 11/10/2020   Lab Results  Component Value Date   CHOL 163 03/25/2021   Lab Results  Component Value Date   HDL 59 03/25/2021   Lab Results  Component Value Date   LDLCALC 86 03/25/2021   Lab Results  Component Value Date   TRIG 101 03/25/2021   Lab Results  Component Value Date   CHOLHDL 2.8 03/25/2021   Lab Results  Component Value Date   HGBA1C 5.4 04/29/2021      Assessment &  Plan:   Physical exam.  Health maintenance up-to-date.  Lab work up-to-date.  -Congratulated on his recent weight loss.  Continue regular exercise habits -Continue with annual flu vaccine -Consider increasing his daily vitamin D to 2000 international units/day based on recent vitamin D level of 33.  No orders of the defined types were placed in this encounter.   Follow-up: No follow-ups on file.    [name], MD

## 2021-06-05 ENCOUNTER — Other Ambulatory Visit: Payer: Self-pay | Admitting: Family Medicine

## 2021-06-29 ENCOUNTER — Telehealth: Payer: Medicare Other | Admitting: Nurse Practitioner

## 2021-06-29 DIAGNOSIS — J029 Acute pharyngitis, unspecified: Secondary | ICD-10-CM

## 2021-06-29 NOTE — Progress Notes (Signed)
Based on what you shared with me, I feel your condition warrants further evaluation and I recommend that you be seen in a face to face visit.   NOTE: There will be NO CHARGE for this eVisit   If you are having a true medical emergency please call 911.      For an urgent face to face visit, [facility] has six urgent care centers for your convenience:     [facility] [place] Get Driving Directions [id number] [address] [place], [place] [address]    [facility]) Get Driving Directions [id number] [place], [place] [address]  [facility]) Get Driving Directions [id number] [address] [place],  [place]  [address]  [facility] Get Driving Directions [id number] [address] [place], [place], [place] [address]   [facility] Get Driving Directions  [id number] [address] [place], [place] [address]   [facility] Get Driving Directions [id number] [address] [place], [place] [address]  Your MyChart E-visit questionnaire answers were reviewed by a board certified advanced clinical practitioner to complete your personal care plan based on your specific symptoms.  Thank you for using e-Visits.

## 2021-07-01 ENCOUNTER — Other Ambulatory Visit: Payer: Self-pay

## 2021-07-01 ENCOUNTER — Telehealth (INDEPENDENT_AMBULATORY_CARE_PROVIDER_SITE_OTHER): Payer: Medicare Other | Admitting: Family Medicine

## 2021-07-01 VITALS — BP 131/89 | HR 63 | Ht 73.0 in | Wt 214.0 lb

## 2021-07-01 DIAGNOSIS — J029 Acute pharyngitis, unspecified: Secondary | ICD-10-CM

## 2021-07-01 DIAGNOSIS — R5383 Other fatigue: Secondary | ICD-10-CM | POA: Diagnosis not present

## 2021-07-01 NOTE — Progress Notes (Signed)
Patient ID: Matthew Pratt, male   DOB: 1951-10-22, 70 y.o.   MRN: [id number]   This visit type was conducted due to national recommendations for restrictions regarding the COVID-19 pandemic in an effort to limit this patient's exposure and mitigate transmission in our community.   Virtual Visit via Video Note  I connected with Matthew Pratt on 07/01/21 at 11:15 AM EDT by a video enabled telemedicine application and verified that I am speaking with the correct person using two identifiers.  Location patient: home Location provider:work or home office Persons participating in the virtual visit: patient, provider  I discussed the limitations of evaluation and management by telemedicine and the availability of in person appointments. The patient expressed understanding and agreed to proceed.   HPI: Matthew Pratt had some onset of fatigue and sore throat last weekend.  He states he took a mother and son to a church camp but they were asymptomatic and to his knowledge they have not gotten sick this week.  He had fairly severe sore throat initially and sore throat symptoms are actually some better at this time.  His only residual symptom is some fatigue.  He denied any nasal congestion, fever, cough.  No dyspnea.  He did a home COVID test yesterday which came back negative.  He has noted perhaps some small amount of cervical adenopathy.  He did a virtual visit 2 days ago for similar issue.  He does feel some improved overall.  No skin rash.   ROS: See pertinent positives and negatives per HPI.  Past Medical History:  Diagnosis Date   Anxiety    Arthritis    BPH (benign prostatic hypertrophy)    CAD (coronary artery disease)    s/p STEMI 10/15/10 w/ Promus DES placed x1 in the first OM   Colon polyps    Complication of anesthesia    pt. reported 2015 penile inplant when getting some anesthesia had chest pain,they readjusted  the meds. and continued with surgery. Had no further problems di not have to  follow up with anyone.   Depression    Depression with anxiety    Dupuytren's contracture of hand    ED (erectile dysfunction)    GERD (gastroesophageal reflux disease)    Glaucoma    HLD (hyperlipidemia)    Hypocontractile bladder    Hypothyroidism    MI (myocardial infarction) ([place])    denies   OSA on CPAP    Peyronie's disease    Rosacea    Skin cancer    BCC    Past Surgical History:  Procedure Laterality Date   CHOLECYSTECTOMY N/A 02/10/2021   Procedure: LAPAROSCOPIC CHOLECYSTECTOMY;  Surgeon: Coralie Keens, MD;  Location: [facility];  Service: General;  Laterality: N/A;   COLONOSCOPY WITH ESOPHAGOGASTRODUODENOSCOPY (EGD)     multiple   CORONARY STENT PLACEMENT     INNER EAR SURGERY     LAPAROSCOPIC CHOLECYSTECTOMY Bilateral 02/09/2021   Dr.blackmon   LASIK  02/1998   PENILE PROSTHESIS IMPLANT     TONSILECTOMY, ADENOIDECTOMY, BILATERAL MYRINGOTOMY AND TUBES     TONSILLECTOMY     VASECTOMY     WISDOM TOOTH EXTRACTION      Family History  Problem Relation Age of Onset   COPD Mother    Cancer Father        gallbladder cancer   Varicose Veins Father    Liver cancer Father    Basal cell carcinoma Father    Basal cell carcinoma Sister    Melanoma  Sister    Rheum arthritis Maternal Grandmother    Heart failure Maternal Grandfather    Colon cancer Paternal Grandmother    Arthritis Paternal Grandfather    Colon cancer Paternal Grandfather    COPD Other     SOCIAL HX: Non-smoker   Current Outpatient Medications:    aspirin EC 81 MG tablet, Take 81 mg by mouth daily. , Disp: , Rfl:    b complex vitamins capsule, Take 1 capsule by mouth daily at 6 PM., Disp: , Rfl:    Cholecalciferol (VITAMIN D3) 50 MCG (2000 UT) capsule, Take 2,000 Units by mouth daily at 6 PM., Disp: , Rfl:    latanoprost (XALATAN) 0.005 % ophthalmic solution, Place 1 drop into both eyes at bedtime., Disp: , Rfl:    omeprazole (PRILOSEC) 10 MG capsule, TAKE 1 CAPSULE BY MOUTH EVERY DAY (Patient  taking differently: Take 20 mg by mouth daily at 6 PM.), Disp: 90 capsule, Rfl: 3   SYNTHROID 137 MCG tablet, TAKE 1 TABLET BY MOUTH EVERY DAY, Disp: 90 tablet, Rfl: 1   timolol (TIMOPTIC) 0.5 % ophthalmic solution, Place 1 drop into both eyes 2 (two) times daily., Disp: , Rfl:    venlafaxine XR (EFFEXOR-XR) 37.5 MG 24 hr capsule, TAKE 3 CAPSULES BY MOUTH EVERY DAY (Patient taking differently: Take 112.5 mg by mouth every evening.), Disp: 270 capsule, Rfl: 3   zinc gluconate 50 MG tablet, Take 50 mg by mouth daily at 6 PM., Disp: , Rfl:   EXAM:  VITALS per patient if applicable:  GENERAL: alert, oriented, appears well and in no acute distress  HEENT: atraumatic, conjunttiva clear, no obvious abnormalities on inspection of external nose and ears  NECK: normal movements of the head and neck  LUNGS: on inspection no signs of respiratory distress, breathing rate appears normal, no obvious gross SOB, gasping or wheezing  CV: no obvious cyanosis  MS: moves all visible extremities without noticeable abnormality  PSYCH/NEURO: pleasant and cooperative, no obvious depression or anxiety, speech and thought processing grossly intact  ASSESSMENT AND PLAN:  Discussed the following assessment and plan:  Sore throat symptoms and fatigue with negative home COVID test yesterday.  He does not relate other respiratory symptoms such as nasal congestion or cough.  No documented fever.  We explained that strep pharyngitis always has to be considered in the absence of nasal congestion and cough but at his age and without any associated fever this seems less likely.  Still possible this is viral and even possibly COVID although his symptoms are relatively mild.  We suggested the following:  -Follow-up for any recurrent sore throat symptoms or fever -Consider repeat home COVID test either today or tomorrow -We recommended an office evaluation for any recurrent sore throat or persistent fatigue  symptoms -Continue over-the-counter medications for any sore throat recurrence- such as Tylenol     I discussed the assessment and treatment plan with the patient. The patient was provided an opportunity to ask questions and all were answered. The patient agreed with the plan and demonstrated an understanding of the instructions.   The patient was advised to call back or seek an in-person evaluation if the symptoms worsen or if the condition fails to improve as anticipated.     [name], MD

## 2021-07-13 ENCOUNTER — Emergency Department (HOSPITAL_COMMUNITY)
Admission: EM | Admit: 2021-07-13 | Discharge: 2021-07-13 | Disposition: A | Discharge status: Home / Self Care | Payer: Medicare Other | Attending: Emergency Medicine | Admitting: Emergency Medicine

## 2021-07-13 ENCOUNTER — Emergency Department (HOSPITAL_COMMUNITY): Payer: Medicare Other

## 2021-07-13 DIAGNOSIS — I2510 Atherosclerotic heart disease of native coronary artery without angina pectoris: Secondary | ICD-10-CM | POA: Diagnosis not present

## 2021-07-13 DIAGNOSIS — Z85828 Personal history of other malignant neoplasm of skin: Secondary | ICD-10-CM | POA: Insufficient documentation

## 2021-07-13 DIAGNOSIS — Z79899 Other long term (current) drug therapy: Secondary | ICD-10-CM | POA: Diagnosis not present

## 2021-07-13 DIAGNOSIS — S0993XA Unspecified injury of face, initial encounter: Secondary | ICD-10-CM | POA: Diagnosis not present

## 2021-07-13 DIAGNOSIS — R519 Headache, unspecified: Secondary | ICD-10-CM | POA: Diagnosis not present

## 2021-07-13 DIAGNOSIS — W06XXXA Fall from bed, initial encounter: Secondary | ICD-10-CM | POA: Diagnosis not present

## 2021-07-13 DIAGNOSIS — S0990XA Unspecified injury of head, initial encounter: Secondary | ICD-10-CM | POA: Diagnosis not present

## 2021-07-13 DIAGNOSIS — E039 Hypothyroidism, unspecified: Secondary | ICD-10-CM | POA: Diagnosis not present

## 2021-07-13 DIAGNOSIS — S01112A Laceration without foreign body of left eyelid and periocular area, initial encounter: Secondary | ICD-10-CM | POA: Diagnosis not present

## 2021-07-13 DIAGNOSIS — Z7982 Long term (current) use of aspirin: Secondary | ICD-10-CM | POA: Insufficient documentation

## 2021-07-13 DIAGNOSIS — S0592XA Unspecified injury of left eye and orbit, initial encounter: Secondary | ICD-10-CM | POA: Diagnosis not present

## 2021-07-13 DIAGNOSIS — S0590XA Unspecified injury of unspecified eye and orbit, initial encounter: Secondary | ICD-10-CM

## 2021-07-13 MED ORDER — METOCLOPRAMIDE HCL 10 MG PO TABS
10.0000 mg | ORAL_TABLET | Freq: Once | ORAL | Status: AC
  Administered 2021-07-13: 10 mg via ORAL
  Filled 2021-07-13: qty 1

## 2021-07-13 MED ORDER — FLUORESCEIN SODIUM 1 MG OP STRP
1.0000 | ORAL_STRIP | Freq: Once | OPHTHALMIC | Status: DC
  Filled 2021-07-13: qty 1

## 2021-07-13 MED ORDER — METOCLOPRAMIDE HCL 10 MG PO TABS: 10.0000 mg | ORAL_TABLET | Freq: Four times a day (QID) | ORAL | 0 refills | Status: DC

## 2021-07-13 MED ORDER — TETRACAINE HCL 0.5 % OP SOLN
2.0000 [drp] | Freq: Once | OPHTHALMIC | Status: DC
  Filled 2021-07-13: qty 4

## 2021-07-13 MED ORDER — DIPHENHYDRAMINE HCL 25 MG PO CAPS
25.0000 mg | ORAL_CAPSULE | Freq: Once | ORAL | Status: AC
  Administered 2021-07-13: 25 mg via ORAL
  Filled 2021-07-13: qty 1

## 2021-07-13 NOTE — ED Triage Notes (Signed)
Pt here with c/o left eye injury . Pt fell out of bed and hit nightstand. Bruising and swelling noted. Pt endorses blurred vision. Dr. Brigitte Pulse with [facility]  suggested pt be checked for fractures. Pt alert and oriented X4.

## 2021-07-13 NOTE — Discharge Instructions (Addendum)
Please follow-up at your appointment at the [facility] tomorrow.  You may always return to the ER for any new or concerning symptoms.  I have written you a short prescription of few tablets of Reglan to use as needed for headaches please take with 25 mg of Benadryl.  Please drink plenty of water.

## 2021-07-13 NOTE — ED Notes (Signed)
DC instructions reviewed with pt. PT verbalized understanding. PT DC °

## 2021-07-13 NOTE — ED Provider Notes (Signed)
[facility] EMERGENCY DEPARTMENT Provider Note   CSN: [id number] Arrival date & time: 07/13/21  0935     History Chief Complaint  Patient presents with   Eye Injury    Matthew Pratt is a 70 y.o. male.  HPI Patient is a 70 year old male with past medical history significant for LASEK eye surgery complaining of left eye pain and headache that occurred after he had a vivid dream Saturday night/Sunday morning and rolled off of his bed and slammed his left eye into the nightstand corner.  He reports he has had swelling or bleeding from the area above his left eyelid.  He called his ophthalmologist Dr. Manuella Ghazi who he sees for glaucoma and was advised to come to the ER by triage nurse.  He states he has some slight blurry vision but no floaters or light flashes.  He states he has a mild circumferential/10 headache.  He describes as achy denies any other associate symptoms.  Denies any visual field cuts or black vision.  He states he did not pass out after a head injury.    Past Medical History:  Diagnosis Date   Anxiety    Arthritis    BPH (benign prostatic hypertrophy)    CAD (coronary artery disease)    s/p STEMI 10/15/10 w/ Promus DES placed x1 in the first OM   Colon polyps    Complication of anesthesia    pt. reported 2015 penile inplant when getting some anesthesia had chest pain,they readjusted  the meds. and continued with surgery. Had no further problems di not have to follow up with anyone.   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 10/28/2018   Irritable bowel syndrome with diarrhea 10/28/2018   Hypothyroid 07/26/2018   GERD (gastroesophageal reflux disease) 07/26/2018   Barrett's esophagus 07/26/2018   Rosacea 07/26/2018   Medicare annual wellness visit, initial 02/24/2018   Dupuytren's contracture of hand 06/23/2017   H/O acute myocardial infarction 06/16/2017   Fatigue 06/16/2017   Benign prostatic hyperplasia with urinary hesitancy 06/16/2017   OSA (obstructive sleep apnea) 12/03/2016   Depression 12/03/2016   Heartburn 10/15/2016   Moderate episode of recurrent major depressive disorder ([place]) 07/22/2016   Atypical chest pain 12/26/2015   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   Erectile dysfunction due to arterial insufficiency 03/26/2015   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   Hyperlipidemia 10/26/2010   DEPRESSION/ANXIETY 10/26/2010   GLAUCOMA 10/26/2010   MYOCARDIAL INFARCTION, ACUTE, INFEROLATERAL 10/26/2010  CAD, NATIVE VESSEL 10/26/2010   SINUS BRADYCARDIA 10/26/2010   Glaucoma 10/26/2010    Past Surgical History:  Procedure Laterality Date   CHOLECYSTECTOMY N/A 02/10/2021   Procedure: LAPAROSCOPIC CHOLECYSTECTOMY;  Surgeon: Coralie Keens, MD;  Location: [facility];  Service: General;  Laterality: N/A;   COLONOSCOPY WITH ESOPHAGOGASTRODUODENOSCOPY (EGD)     multiple   CORONARY STENT PLACEMENT     INNER EAR SURGERY     LAPAROSCOPIC CHOLECYSTECTOMY Bilateral 02/09/2021   Dr.blackmon   LASIK  02/1998   PENILE  PROSTHESIS IMPLANT     TONSILECTOMY, ADENOIDECTOMY, BILATERAL MYRINGOTOMY AND TUBES     TONSILLECTOMY     VASECTOMY     WISDOM TOOTH EXTRACTION         Family History  Problem Relation Age of Onset   COPD Mother    Cancer Father        gallbladder cancer   Varicose Veins Father    Liver cancer Father    Basal cell carcinoma Father    Basal cell carcinoma Sister    Melanoma Sister    Rheum arthritis Maternal Grandmother    Heart failure Maternal Grandfather    Colon cancer Paternal Grandmother    Arthritis Paternal Grandfather    Colon cancer Paternal Grandfather    COPD Other     Social History   Tobacco Use   Smoking status: Never   Smokeless tobacco: Never  Vaping Use   Vaping Use: Never used  Substance Use Topics   Alcohol use: No   Drug use: No    Home Medications Prior to Admission medications   Medication Sig Start Date End Date Taking? Authorizing Provider  aspirin EC 81 MG tablet Take 81 mg by mouth every morning.   Yes [provider]  b complex vitamins capsule Take 1 capsule by mouth daily at 6 PM.   Yes [provider]  Cholecalciferol (VITAMIN D3) 50 MCG (2000 UT) capsule Take 4,000 Units by mouth daily at 6 PM.   Yes [provider]  diphenhydrAMINE HCl, Sleep, (ZZZQUIL) 25 MG CAPS Take 25 mg by mouth at bedtime as needed (sleep).   Yes [provider]  ibuprofen (ADVIL) 200 MG tablet Take 200 mg by mouth at bedtime as needed (pain/sleep).   Yes [provider]  latanoprost (XALATAN) 0.005 % ophthalmic solution Place 1 drop into both eyes at bedtime. 05/15/20  Yes [provider]  metoCLOPramide (REGLAN) 10 MG tablet Take 1 tablet (10 mg total) by mouth every 6 (six) hours. 07/13/21  Yes Mong Neal S, PA  omeprazole (PRILOSEC OTC) 20 MG tablet Take 20 mg by mouth daily at 6 PM.   Yes [provider]  SYNTHROID 137 MCG tablet TAKE 1 [place] Patient taking differently: Take  137 mcg by mouth daily before breakfast. 06/09/21  Yes Burchette, Alinda Sierras, MD  timolol (TIMOPTIC) 0.5 % ophthalmic solution Place 1 drop into both eyes 2 (two) times daily.   Yes [provider]  venlafaxine XR (EFFEXOR-XR) 37.5 MG 24 hr capsule TAKE 3 CAPSULES BY MOUTH EVERY DAY Patient taking differently: Take 112.5 mg by mouth daily at 6 PM. 11/03/20  Yes Burchette, Alinda Sierras, MD  zinc gluconate 50 MG tablet Take 50 mg by mouth daily at 6 PM.   Yes [provider]    Allergies    Crestor [rosuvastatin], Iodinated diagnostic agents, Sulfa antibiotics, and Sulfonamide derivatives  Review of Systems   Review of Systems  Constitutional:  Negative for chills and fever.  HENT:  Negative for congestion.   Eyes:  Positive for pain and visual disturbance.  Respiratory:  Negative for shortness of breath.   Cardiovascular:  Negative for chest pain.  Gastrointestinal:  Negative for abdominal pain.  Musculoskeletal:  Negative for neck pain.   Physical Exam Updated Vital Signs BP 112/72 (BP Location: Right Arm)   Pulse (!) 45   Temp 98 F (36.7 C) (Oral)   Resp 18   SpO2 100%   Physical Exam Vitals and nursing note reviewed.  Constitutional:      General: He is not in acute distress.    Appearance: Normal appearance. He is not ill-appearing.  HENT:     Head: Normocephalic.     Comments: Significant bruising around the left orbit there is a small laceration above the left eyebrow but is not gaping superficial Eyes:     General: No scleral icterus.       Right eye: No discharge.        Left eye: No discharge.     Conjunctiva/sclera: Conjunctivae normal.     Comments: Extraocular movements are intact.  9 mm symmetric pupils responsive to light and accommodation.  No evidence of entrapment with extraocular movements that are unencumbered.  Fluorescein exam unremarkable.  No evidence of globe rupture.  Visual fields grossly intact.  Pulmonary:     Effort: Pulmonary effort  is normal.     Breath sounds: No stridor.  Musculoskeletal:     Comments: No bony tenderness over joints or long bones of the upper and lower extremities.    No neck or back midline tenderness, step-off, deformity, or bruising. Able to turn head left and right 45 degrees without difficulty.  Full range of motion of upper and lower extremity joints shown after palpation was conducted; with 5/5 symmetrical strength in upper and lower extremities. No chest wall tenderness, no cranial tenderness besides cranial TTP in EYE EXAM  Patient has intact sensation grossly in lower and upper extremities. Intact patellar and ankle reflexes. Patient able to ambulate without difficulty.  Radial and DP pulses palpated BL.     Neurological:     Mental Status: He is alert and oriented to person, place, and time. Mental status is at baseline.    ED Results / Procedures / Treatments   Labs (all labs ordered are listed, but only abnormal results are displayed) Labs Reviewed - No data to display  EKG None  Radiology CT Head Wo Contrast  Result Date: 07/13/2021 CLINICAL DATA:  Facial trauma, patient fell and hit I EXAM: CT HEAD WITHOUT CONTRAST CT MAXILLOFACIAL WITHOUT CONTRAST TECHNIQUE: Multidetector CT imaging of the head and maxillofacial structures were performed using the standard protocol without intravenous contrast. Multiplanar CT image reconstructions of the maxillofacial structures were also generated. COMPARISON:  None. FINDINGS: CT HEAD FINDINGS Brain: There is no evidence of acute intracranial hemorrhage, extra-axial fluid collection, or infarct. The ventricles are not enlarged. There is no midline shift. There is no mass lesion. Vascular: There is mild calcification of the bilateral cavernous ICAs. Skull: Normal. Negative for fracture or focal lesion. Other: None. CT MAXILLOFACIAL FINDINGS Osseous: No fracture or mandibular dislocation. No destructive process. There is degenerative change of the  temporomandibular joints, left more than right. Orbits: Negative. No traumatic or inflammatory finding. Sinuses: There is mild mucosal thickening in the maxillary sinuses. Soft tissues: Negative. IMPRESSION: No acute intracranial pathology. No acute facial bone fracture. Electronically Signed  By: Valetta Mole MD   On: 07/13/2021 11:50   CT Maxillofacial Wo Contrast  Result Date: 07/13/2021 CLINICAL DATA:  Facial trauma, patient fell and hit I EXAM: CT HEAD WITHOUT CONTRAST CT MAXILLOFACIAL WITHOUT CONTRAST TECHNIQUE: Multidetector CT imaging of the head and maxillofacial structures were performed using the standard protocol without intravenous contrast. Multiplanar CT image reconstructions of the maxillofacial structures were also generated. COMPARISON:  None. FINDINGS: CT HEAD FINDINGS Brain: There is no evidence of acute intracranial hemorrhage, extra-axial fluid collection, or infarct. The ventricles are not enlarged. There is no midline shift. There is no mass lesion. Vascular: There is mild calcification of the bilateral cavernous ICAs. Skull: Normal. Negative for fracture or focal lesion. Other: None. CT MAXILLOFACIAL FINDINGS Osseous: No fracture or mandibular dislocation. No destructive process. There is degenerative change of the temporomandibular joints, left more than right. Orbits: Negative. No traumatic or inflammatory finding. Sinuses: There is mild mucosal thickening in the maxillary sinuses. Soft tissues: Negative. IMPRESSION: No acute intracranial pathology. No acute facial bone fracture. Electronically Signed   By: Valetta Mole MD   On: 07/13/2021 11:50    Procedures Procedures   Medications Ordered in ED Medications  fluorescein ophthalmic strip 1 strip (has no administration in time range)  tetracaine (PONTOCAINE) 0.5 % ophthalmic solution 2 drop (has no administration in time range)  metoCLOPramide (REGLAN) tablet 10 mg (10 mg Oral Given 07/13/21 1449)  diphenhydrAMINE (BENADRYL)  capsule 25 mg (25 mg Oral Given 07/13/21 1449)    ED Course  I have reviewed the triage vital signs and the nursing notes.  Pertinent labs & imaging results that were available during my care of the patient were reviewed by me and considered in my medical decision making (see chart for details).  Clinical Course as of 07/13/21 1527  Mon Jul 13, 2021  1457 Discussed with [facility] front desk they will fit him in for appointment tomorrow.  They will reach out to him about scheduling this. [WF]    Clinical Course User Index [WF] Tedd Sias, [place]   MDM Rules/Calculators/A&P                           Patient is a 70 year old male here after trauma.  CT head and CT max facial scan without contrast shows no fracture of the orbital bones.  No evidence of entrapment no hyphema no fluorescein uptake to indicate either globe rupture or corneal abrasion.  Patient is neurologically intact does not have any tenderness on my trauma examination.  Slightly decreased visual acuity in left eye.  Called [facility] and they have scheduled him for follow-up appointment tomorrow morning.  Head completely resolved with Reglan Benadryl.  Discussed with attending physician.  Retinal detachment seems quite unlikely given he is not having floaters or flashes he has no dark curtain over vision.  No other associate symptoms.  He is well-appearing ambulatory and has close follow-up.  Return precautions are given.  Discharged home.  Final Clinical Impression(s) / ED Diagnoses Final diagnoses:  Eye trauma    Rx / DC Orders ED Discharge Orders          Ordered    metoCLOPramide (REGLAN) 10 MG tablet  Every 6 hours        07/13/21 1527             Tedd Sias, [place] [id number]    Lianne Cure, DO [id number]  [phone]  

## 2021-07-13 NOTE — ED Provider Notes (Signed)
Emergency Medicine Provider Triage Evaluation Note  Matthew Pratt , a 70 y.o. male  was evaluated in triage.  Pt complains of let eye injury that occurred earlier Sunday morning (Saturday night into Sunday morning). Pt was sleeping when he rolled out of bed and hit the corner of his night stand on his left eye. He reports small amount of bleeding at that time that he was able to stop with pressure. He called his ophthalmologist Dr. Manuella Ghazi today (sees him for glaucoma) and was advised to come to the ED with concern for orbital fracture. Pt complains of mild eye irritation and blurred vision however denies eye pain. No eye redness. Not anticoagulated.  Review of Systems  Positive: + left eye injury, eye irritation Negative: - eye pain  Physical Exam  BP 108/77   Pulse (!) 51   Temp 98 F (36.7 C) (Oral)   Resp 14   SpO2 99%  Gen:   Awake, no distress   Resp:  Normal effort  MSK:   Moves extremities without difficulty  Other:  Left periorbital ecchymosis, small superficial laceration to upper lid; not actively bleeding. EOMI without entrapment. No hyphema.   Medical Decision Making  Medically screening exam initiated at 11:02 AM.  Appropriate orders placed.  Matthew Pratt was informed that the remainder of the evaluation will be completed by another provider, this initial triage assessment does not replace that evaluation, and the importance of remaining in the ED until their evaluation is complete.  Sent from ophtho with concern for orbital fracture. Given fall and age will add on CT head at this time. Tetanus UTD. Laceration > 24 hours, small and superficial; do not feel it requires closure.    Eustaquio Maize, PA-C 07/13/21 1105    Godfrey Pick, MD 07/13/21 1949

## 2021-07-14 DIAGNOSIS — H209 Unspecified iridocyclitis: Secondary | ICD-10-CM | POA: Diagnosis not present

## 2021-07-15 ENCOUNTER — Encounter: Payer: Self-pay | Admitting: Family Medicine

## 2021-07-21 ENCOUNTER — Encounter: Payer: Self-pay | Admitting: Family Medicine

## 2021-07-28 DIAGNOSIS — H209 Unspecified iridocyclitis: Secondary | ICD-10-CM | POA: Diagnosis not present

## 2021-08-19 ENCOUNTER — Telehealth: Payer: Self-pay | Admitting: Pharmacist

## 2021-08-19 NOTE — Chronic Care Management (AMB) (Signed)
    Chronic Care Management Pharmacy Assistant   Name: Maveryk Myung Farler  MRN: [id number] DOB: 08/23/1951  08/20/2021 APPOINTMENT REMINDER   Verlin Grills Huckins was reminded to have all medications, supplements and any blood glucose and blood pressure readings available for review with Jeni Salles, Pharm. D, at his office visit on 08/20/2021 at 9 AM.   Questions: Have you had any recent office visit or specialist visit outside of [facility]? No  Are there any concerns you would like to discuss during your office visit? He recently had Venlafaxine XR increased to 37.'5mg'$  4 pills daily ('150mg'$ ) due to increased morning depression, patient states this increase has fixed the problem. He has a couple more questions written that he would like to discuss with you at his visit.   Are you having any problems obtaining your medications? No  If patient has any PAP medications ask if they are having any problems getting their PAP medication or refill? No   Care Gaps:  AWV - completed 04/17/2021 Flu - per patient he received the flu/covid vaccine at [facility] yesterday. [id number] booster 5 - due 009/17/2022   Star Rating Drug:  None  Any gaps in medications fill history?No  [facility] [phone]

## 2021-08-20 ENCOUNTER — Ambulatory Visit (INDEPENDENT_AMBULATORY_CARE_PROVIDER_SITE_OTHER): Payer: Medicare Other | Admitting: Pharmacist

## 2021-08-20 ENCOUNTER — Other Ambulatory Visit: Payer: Self-pay

## 2021-08-20 DIAGNOSIS — E039 Hypothyroidism, unspecified: Secondary | ICD-10-CM

## 2021-08-20 DIAGNOSIS — E785 Hyperlipidemia, unspecified: Secondary | ICD-10-CM

## 2021-08-20 NOTE — Progress Notes (Signed)
Chronic Care Management Pharmacy Note  08/26/2021 Name:  Matthew Pratt MRN:  [id number] DOB:  1951-04-28  Summary: Pt is having trouble with sleep   Recommendations/Changes made from today's visit: -Recommended trial of moving venlafaxine to morning to see if this helps with sleep -Recommended trial of trazodone 25 mg for sleep -Recommended holding fish oil to see if this is causing allergic reaction   Plan: Follow up with PCP for insomnia management   Lab Results  Component Value Date   CHOL 163 03/25/2021   HDL 59 03/25/2021   LDLCALC 86 03/25/2021   TRIG 101 03/25/2021   CHOLHDL 2.8 03/25/2021   Will trial without fish oil to see if this eliminates allergies -   Vitamin D - 50  33 He increased to 5000 units because he couldn't find 4000  Pratt Matthew every 5 weeks  Vivid dreams--? 221 --> 204   Last vitamin D Lab Results  Component Value Date   VD25OH 33.61 02/19/2021      Has cut back severely on carbs - lost 15 lbs 1000 calorie burn on recumbent bike, he is just doing cardio - he is losing muscle mass Napping around noon  Subjective: Matthew Pratt is an 70 y.o. year old male who is a primary patient of Burchette, Alinda Sierras, MD.  The CCM team was consulted for assistance with disease management and care coordination needs.    Engaged with patient face to face for follow up visit in response to provider referral for pharmacy case management and/or care coordination services.   Consent to Services:  The patient was given information about Chronic Care Management services, agreed to services, and gave verbal consent prior to initiation of services.  Please see initial visit note for detailed documentation.   Patient Care Team: [name], MD as PCP - General (Family Medicine) Viona Gilmore, [facility] as Pharmacist (Pharmacist)  Recent office visits: 07/01/21 [name], MD: Patient presented for video visit for sore throat and headache.  Recommended repeat COVID test.  05/18/21 [name], MD: Patient presented for annual exam. Recommended increasing vitamin D to 2000 units based on lower normal vitamin D level.  04/29/21 [name], MD: Patient presented for weight loss. A1c was 5.4%.  04/17/21 Randel Pigg, LPN: Patient presented for AWV.  Recent consult visits: 01/07/21 Elder Cyphers (cardiology): Patient presented to the lipid clinic for initiation of Repatha.  12/30/20 Cherlynn Kaiser, MD (cardiology): Patient presented to establish care and get surgical clearance. Referred to lipid clinic.  Hospital visits: 07/13/21 Patient presented [facility] emergency department for eye trauma.  Objective:  Lab Results  Component Value Date   CREATININE 1.12 02/10/2021   BUN 17 02/10/2021   GFR 69.70 11/10/2020   GFRNONAA >60 02/10/2021   GFRAA >60 07/01/2020   NA 138 02/10/2021   K 4.2 02/10/2021   CALCIUM 8.9 02/10/2021   CO2 26 02/10/2021    Lab Results  Component Value Date/Time   HGBA1C 5.4 04/29/2021 11:36 AM   HGBA1C 5.6 06/29/2020 11:55 AM   HGBA1C (H) 10/16/2010 05:35 AM    5.7 (NOTE)  According to the ADA Clinical Practice Recommendations for 2011, when HbA1c is used as a screening test:   >=6.5%   Diagnostic of Diabetes Mellitus           (if abnormal result  is confirmed)  5.7-6.4%   Increased risk of developing Diabetes Mellitus  References:Diagnosis and Classification of Diabetes Mellitus,Diabetes VOJJ,0093,81(WEXHB 1):S62-S69 and Standards of Medical Care in         Diabetes - 2011,Diabetes ZJIR,6789,38  (Suppl 1):S11-S61.   GFR 69.70 11/10/2020 12:11 PM   GFR 73.77 08/06/2019 03:43 PM    Last diabetic Eye exam: No results found for: HMDIABEYEEXA  Last diabetic Foot exam: No results found for: HMDIABFOOTEX   Lab Results  Component Value Date   CHOL 163 03/25/2021   HDL 59 03/25/2021   LDLCALC 86  03/25/2021   TRIG 101 03/25/2021   CHOLHDL 2.8 03/25/2021    Hepatic Function Latest Ref Rng & Units 03/25/2021 03/02/2021 11/13/2020  Total Protein 6.0 - 8.5 g/dL 7.0 6.8 7.3  Albumin 3.7 - 4.7 g/dL 4.6 4.4 4.3  AST 0 - 40 IU/L _0 ALT 0 - 44 IU/L 33 24 77(H)  Alk Phosphatase 44 - 121 IU/L 79 79 76  Total Bilirubin 0.0 - 1.2 mg/dL 1.0 0.7 0.9  Bilirubin, Direct 0.00 - 0.40 mg/dL 0.26 0.21 0.2    Lab Results  Component Value Date/Time   TSH 0.44 02/19/2021 09:24 AM   TSH 1.55 08/28/2019 10:08 AM    CBC Latest Ref Rng & Units 02/10/2021 11/10/2020 07/01/2020  WBC 4.0 - 10.5 K/uL 4.3 4.4 3.4(L)  Hemoglobin 13.0 - 17.0 g/dL 14.0 14.6 13.0  Hematocrit 39.0 - 52.0 % 40.4 42.8 38.6(L)  Platelets 150 - 400 K/uL 203 189.0 167    Lab Results  Component Value Date/Time   VD25OH 33.61 02/19/2021 09:24 AM   VD25OH 47.44 03/29/2019 12:43 PM    Clinical ASCVD: Yes  The ASCVD Risk score (Arnett DK, et al., 2019) failed to calculate for the following reasons:   The patient has a prior MI or stroke diagnosis    Depression screen [facility] 2/9 07/01/2021 04/17/2021 04/18/2020  Decreased Interest 0 0 0  Down, Depressed, Hopeless 1 0 0  PHQ - 2 Score 1 0 0  Altered sleeping - - -  Tired, decreased energy - - -  Change in appetite - - -  Feeling bad or failure about yourself  - - -  Trouble concentrating - - -  Moving slowly or fidgety/restless - - -  Suicidal thoughts - - -  PHQ-9 Score - - -  Difficult doing work/chores - - -      Social History   Tobacco Use  Smoking Status Never  Smokeless Tobacco Never   BP Readings from Last 3 Encounters:  07/13/21 112/72  07/01/21 131/89  05/18/21 118/60   Pulse Readings from Last 3 Encounters:  07/13/21 (!) 45  07/01/21 63  05/18/21 (!) 58   Wt Readings from Last 3 Encounters:  07/01/21 214 lb (97.1 kg)  05/18/21 220 lb 11.2 oz (100.1 kg)  04/29/21 225 lb 8 oz (102.3 kg)    Assessment/Interventions: Review of patient past medical  history, allergies, medications, health status, including review of consultants reports, laboratory and other test data, was performed as part of comprehensive evaluation and provision of chronic care management services.   SDOH:  (Social Determinants of Health) assessments and interventions performed: No   CCM Care Plan  Allergies  Allergen Reactions   Crestor [Rosuvastatin] Other (See Comments)    Myalgia.     Iodinated Diagnostic Agents Hives and Itching   Sulfa Antibiotics Rash   Sulfonamide Derivatives Rash    Medications Reviewed Today     Reviewed by Viona Gilmore, [facility] (Pharmacist) on 08/26/21 at [place] List Status: <None>   Medication Order Taking? Sig Documenting Provider Last Dose Status Informant  aspirin EC 81 MG tablet [id number] Yes Take 81 mg by mouth every morning. [provider] Taking Active Self           Med Note Tamala Julian, JEFFREY W   Fri Jun 27, 2020 11:32 PM)    b complex vitamins capsule [id number] Yes Take 1 capsule by mouth daily at 6 PM. [provider] Taking Active Self  calcium carbonate (CALCIUM 600) 600 MG TABS tablet [id number] Yes Take 600 mg by mouth daily. [provider] Taking Active   Cholecalciferol (VITAMIN D3) 125 MCG (5000 UT) CAPS [id number] Yes Take 5,000 Units by mouth daily at 6 PM. [provider] Taking Active Self  ibuprofen (ADVIL) 200 MG tablet [id number]  Take 200 mg by mouth at bedtime as needed (pain/sleep). [provider]  Active Self  latanoprost (XALATAN) 0.005 % ophthalmic solution [id number] Yes Place 1 drop into both eyes at bedtime. [provider] Taking Active Self  metoCLOPramide (REGLAN) 10 MG tablet [id number]  Take 1 tablet (10 mg total) by mouth every 6 (six) hours. Pati Gallo S, [place]  Active   mirtazapine (REMERON) 7.5 MG tablet [id number] Yes Take 7.5 mg by mouth at bedtime. [provider] Taking Active   Omega-3 Fatty Acids (FISH OIL) 1000 MG CAPS  [id number] Yes Take 1 capsule by mouth daily. [provider] Taking Active   omeprazole (PRILOSEC OTC) 20 MG tablet [id number] Yes Take 20 mg by mouth daily at 6 PM. [provider] Taking Active Self  SYNTHROID 137 MCG tablet [id number] Yes TAKE 1 [place]  Patient taking differently: Take 137 mcg by mouth daily before breakfast.   [name], MD Taking Active   timolol (TIMOPTIC) 0.5 % ophthalmic solution [id number] Yes Place 1 drop into both eyes 2 (two) times daily. [provider] Taking Active Self  TURMERIC PO [id number] Yes Take 1,500 mg by mouth daily. [provider] Taking Active   venlafaxine XR (EFFEXOR-XR) 37.5 MG 24 hr capsule [id number] Yes TAKE 3 CAPSULES BY MOUTH EVERY DAY  Patient taking differently: Take 150 mg by mouth daily at 6 PM. Taking 37.5 mg in the morning and 112.5 mg in the evening.   [name], MD Taking Active   zinc gluconate 50 MG tablet [id number] Yes Take 50 mg by mouth daily at 6 PM. 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 10/28/2018   Irritable  bowel syndrome with diarrhea 10/28/2018   Hypothyroid 07/26/2018   GERD (gastroesophageal reflux disease) 07/26/2018   Barrett's esophagus 07/26/2018   Rosacea 07/26/2018   Medicare annual wellness visit, initial 02/24/2018    Dupuytren's contracture of hand 06/23/2017   H/O acute myocardial infarction 06/16/2017   Fatigue 06/16/2017   Benign prostatic hyperplasia with urinary hesitancy 06/16/2017   OSA (obstructive sleep apnea) 12/03/2016   Depression 12/03/2016   Heartburn 10/15/2016   Moderate episode of recurrent major depressive disorder ([place]) 07/22/2016   Atypical chest pain 12/26/2015   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   Erectile dysfunction due to arterial insufficiency 03/26/2015   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   Hyperlipidemia 10/26/2010   DEPRESSION/ANXIETY 10/26/2010   GLAUCOMA 10/26/2010   MYOCARDIAL INFARCTION, ACUTE, INFEROLATERAL 10/26/2010   CAD, NATIVE VESSEL 10/26/2010   SINUS BRADYCARDIA 10/26/2010   Glaucoma 10/26/2010    Immunization History  Administered Date(s) Administered   Fluad Quad(high Dose 65+) 08/10/2019   Influenza, High Dose Seasonal PF 09/18/2020, 08/18/2021   Influenza-Unspecified 09/13/2016, 10/04/2017, 07/28/2018, 09/18/2020   PFIZER Comirnaty(Gray Top)Covid-19 Tri-Sucrose Vaccine 04/21/2021   PFIZER(Purple Top)SARS-COV-2 Vaccination 01/27/2020, 02/20/2020, 09/18/2020, 08/18/2021   Pfizer Covid-19 Vaccine Bivalent Booster 51yr & up 08/18/2021   Pneumococcal Conjugate-13 06/16/2017, 06/16/2017   Pneumococcal Polysaccharide-23 08/10/2019   Tdap 08/21/2018   Zoster Recombinat (Shingrix) 11/13/2019, 04/09/2020   Patient reports overall he has been feeling much better since gallbladder surgery. His biggest concern right now is not sleeping well. He also had an episode of falling off his bed. He was taking Zzquil but was worried it would interact with his medications and stopped taking it. He was prescribed mirtazapine 7.5 mg and he could only handle taking 1/4 of a tablet but this crumbles and makes it difficult to help. He inquired about trying trazodone.   Patient is also doing a lot of cardio lately and this has been very tiring for him. Recommended alternating  cardio with strength training or other muscle strengthening exercises.  Conditions to be addressed/monitored:  Hyperlipidemia, Coronary Artery Disease, GERD, Hypothyroidism, Depression, Osteopenia and glaucoma  Conditions addressed at this visit: Hyperlipidemia, hypothyroidism  Care Plan : CCM Pharmacy Care Plan  Updates made by PViona Gilmore RFridleysince 08/26/2021 12:00 AM     Problem: Problem: Hyperlipidemia, Coronary Artery Disease, GERD, Hypothyroidism, Depression, Osteopenia and glaucoma      Long-Range Goal: Patient-Specific Goal   Start Date: 02/19/2021  Expected End Date: 02/19/2022  Recent Progress: On track  Priority: High  Note:   Current Barriers:  Unable to independently monitor therapeutic efficacy Unable to achieve control of cholesterol   Pharmacist Clinical Goal(s):  Patient will verbalize ability to afford treatment regimen achieve adherence to monitoring guidelines and medication adherence to achieve therapeutic efficacy achieve control of cholesterol as evidenced by next cholesterol check  through collaboration with PharmD and provider.   Interventions: 1:1 collaboration with BEulas Post MD regarding development and update of comprehensive plan of care as evidenced by provider attestation and co-signature Inter-disciplinary care team collaboration (see longitudinal plan of care) Comprehensive medication review performed; medication list updated in electronic medical record  Hyperlipidemia: (LDL goal < 70) -Uncontrolled -Current treatment: No medications -Medications previously tried: statins (LFT elevation), Repatha (side effects)  -Current dietary patterns: patient has cut out a lot of fat -Current exercise habits: no structured exercise -Educated on Cholesterol goals;  Importance of limiting foods high in cholesterol; Exercise goal of 150 minutes per week; -Recommended to continue current medication Consider trial of Praluent.  CAD/History  of MI (Goal: prevent future heart attacks and strokes) -Controlled -Current treatment  Aspirin 81 mg 1 tablet daily -Medications previously tried: none  -Counseled on monitoring for signs of bleeding such as unexplained and excessive bleeding from a cut or injury, easy or excessive bruising, blood in urine or stools, and nosebleeds without a known cause  Hypothyroidism (Goal: TSH 0.35-4.5) -Controlled -Current treatment  Synthroid 137 mcg 1 tablet daily -Medications previously tried: none  -Recommended to continue current medication  GERD/Barrett's esophagus (Goal: minimize symptoms) -Controlled -Current treatment  Omeprazole 20 mg 1 capsule daily -Medications previously tried: none  -Recommended to continue current medication  Rosacea (Goal: minimize symptoms) -Controlled -Current treatment  Doxycycline 100 mg 1 tablet daily Metronidazole 1% gel apply daily -Medications previously tried: none  -Recommended to continue current medication  Depression (Goal: minimize symptoms) -Controlled -Current treatment: Venlafaxine XR 37.5 mg 4 capsules daily -Medications previously tried/failed: SSRIs (bruising) -PHQ9: 0 -Educated on Benefits of medication for symptom control Benefits of cognitive-behavioral therapy with or without medication -Recommended to continue current medication -Recommended follow up with behavioral health   Osteopenia (Goal prevent fractures) -Controlled -Last DEXA Scan: 04/16/20              T-Score femoral neck: RFN -1.2, LFN -1.3             T-Score total hip: n/a             T-Score lumbar spine: -1.3             T-Score forearm radius: n/a             10-year probability of major osteoporotic fracture: 9.9%             10-year probability of hip fracture: 3.1% -Patient is not a candidate for pharmacologic treatment -Current treatment  Vitamin D 5000 units 1 tablet daily Calcium 600 mg 1 tablet daily -Medications previously tried: none -Recommend  [phone] units of vitamin D daily. Recommend 1200 mg of calcium daily from dietary and supplemental sources. Recommend weight-bearing and muscle strengthening exercises for building and maintaining bone density. -Recommended to continue current medication  Glaucoma (Goal: lower intraocular pressure) -Controlled -Current treatment  Latanoprost 0.005% 1 drop in both eyes at bedtime Timolol 0.5% 1 drop in both eyes twice daily -Medications previously tried: none  -Recommended to continue current medication  Health Maintenance -Vaccine gaps: none -Current therapy:  Vitamin B complex daily Turmeric 3100-9000 mg daily Zinc 50 mg 1 tablet daily -Educated on Cost vs benefit of each product must be carefully weighed by individual consumer -Patient is satisfied with current therapy and denies issues -Recommended to continue current medication  Patient Goals/Self-Care Activities Patient will:  - take medications as prescribed target a minimum of 150 minutes of moderate intensity exercise weekly  Follow Up Plan: Face to Face appointment with care management team member scheduled for:  6 months      Medication Assistance:  Repatha obtained through [facility] medication assistance program.  Enrollment ends when grant funding runs out  Compliance/Adherence/Medication fill history: Care Gaps: Influenza   Star-Rating Drugs: None  Patient's preferred pharmacy is:  [id number] [place], [place] - [address]Phone: [phone]: [phone] Uses pill box? Yes - two pill boxes - 1 for AM and 1 for PM Pt endorses 90% compliance - patient reports he has been more compliant with his morning medications  We discussed: Current pharmacy is preferred with insurance plan and patient is satisfied with pharmacy services Patient decided to: Continue current medication management  strategy  Care Plan and Follow Up Patient Decision:  Patient agrees to Care Plan and Follow-up.  Plan: Face to Face appointment with care management team member scheduled for: 6 months  Jeni Salles, PharmD [place] Pharmacist [place] at [place] [phone]

## 2021-08-26 NOTE — Patient Instructions (Signed)
Hi Matthew Pratt,  It was great to catch up with you again! Below is a summary of some of the topics we discussed.   Please reach out to me if you have any questions or need anything before our follow up!  Best, [name]  Matthew Pratt, PharmD, [place] at [place]   Visit Information   Goals Addressed   None    Patient Care Plan: CCM Pharmacy Care Plan     Problem Identified: Problem: Hyperlipidemia, Coronary Artery Disease, GERD, Hypothyroidism, Depression, Osteopenia and glaucoma      Long-Range Goal: Patient-Specific Goal   Start Date: 02/19/2021  Expected End Date: 02/19/2022  Recent Progress: On track  Priority: High  Note:   Current Barriers:  Unable to independently monitor therapeutic efficacy Unable to achieve control of cholesterol   Pharmacist Clinical Goal(s):  Patient will verbalize ability to afford treatment regimen achieve adherence to monitoring guidelines and medication adherence to achieve therapeutic efficacy achieve control of cholesterol as evidenced by next cholesterol check  through collaboration with PharmD and provider.   Interventions: 1:1 collaboration with Matthew Post, MD regarding development and update of comprehensive plan of care as evidenced by provider attestation and co-signature Inter-disciplinary care team collaboration (see longitudinal plan of care) Comprehensive medication review performed; medication list updated in electronic medical record  Hyperlipidemia: (LDL goal < 70) -Uncontrolled -Current treatment: No medications -Medications previously tried: statins (LFT elevation), Repatha (side effects)  -Current dietary patterns: patient has cut out a lot of fat -Current exercise habits: no structured exercise -Educated on Cholesterol goals;  Importance of limiting foods high in cholesterol; Exercise goal of 150 minutes per week; -Recommended to continue current  medication Consider trial of Praluent.  CAD/History of MI (Goal: prevent future heart attacks and strokes) -Controlled -Current treatment  Aspirin 81 mg 1 tablet daily -Medications previously tried: none  -Counseled on monitoring for signs of bleeding such as unexplained and excessive bleeding from a cut or injury, easy or excessive bruising, blood in urine or stools, and nosebleeds without a known cause  Hypothyroidism (Goal: TSH 0.35-4.5) -Controlled -Current treatment  Synthroid 137 mcg 1 tablet daily -Medications previously tried: none  -Recommended to continue current medication  GERD/Barrett's esophagus (Goal: minimize symptoms) -Controlled -Current treatment  Omeprazole 20 mg 1 capsule daily -Medications previously tried: none  -Recommended to continue current medication  Rosacea (Goal: minimize symptoms) -Controlled -Current treatment  Doxycycline 100 mg 1 tablet daily Metronidazole 1% gel apply daily -Medications previously tried: none  -Recommended to continue current medication  Depression (Goal: minimize symptoms) -Controlled -Current treatment: Venlafaxine XR 37.5 mg 4 capsules daily -Medications previously tried/failed: SSRIs (bruising) -PHQ9: 0 -Educated on Benefits of medication for symptom control Benefits of cognitive-behavioral therapy with or without medication -Recommended to continue current medication -Recommended follow up with behavioral health   Osteopenia (Goal prevent fractures) -Controlled -Last DEXA Scan: 04/16/20              T-Score femoral neck: RFN -1.2, LFN -1.3             T-Score total hip: n/a             T-Score lumbar spine: -1.3             T-Score forearm radius: n/a             10-year probability of major osteoporotic fracture: 9.9%             10-year probability of hip  fracture: 3.1% -Patient is not a candidate for pharmacologic treatment -Current treatment  Vitamin D 5000 units 1 tablet daily Calcium 600 mg 1 tablet  daily -Medications previously tried: none -Recommend [phone] units of vitamin D daily. Recommend 1200 mg of calcium daily from dietary and supplemental sources. Recommend weight-bearing and muscle strengthening exercises for building and maintaining bone density. -Recommended to continue current medication  Glaucoma (Goal: lower intraocular pressure) -Controlled -Current treatment  Latanoprost 0.005% 1 drop in both eyes at bedtime Timolol 0.5% 1 drop in both eyes twice daily -Medications previously tried: none  -Recommended to continue current medication  Health Maintenance -Vaccine gaps: none -Current therapy:  Vitamin B complex daily Turmeric 3100-9000 mg daily Zinc 50 mg 1 tablet daily -Educated on Cost vs benefit of each product must be carefully weighed by individual consumer -Patient is satisfied with current therapy and denies issues -Recommended to continue current medication  Patient Goals/Self-Care Activities Patient will:  - take medications as prescribed target a minimum of 150 minutes of moderate intensity exercise weekly  Follow Up Plan: Face to Face appointment with care management team member scheduled for:  6 months      Patient verbalizes understanding of instructions provided today and agrees to view in [place].  The pharmacy team will reach out to the patient again over the next 30 days.   Matthew Pratt, [facility]

## 2021-09-03 DIAGNOSIS — H401131 Primary open-angle glaucoma, bilateral, mild stage: Secondary | ICD-10-CM | POA: Diagnosis not present

## 2021-09-03 DIAGNOSIS — H2513 Age-related nuclear cataract, bilateral: Secondary | ICD-10-CM | POA: Diagnosis not present

## 2021-09-04 DIAGNOSIS — E785 Hyperlipidemia, unspecified: Secondary | ICD-10-CM | POA: Diagnosis not present

## 2021-09-04 DIAGNOSIS — E039 Hypothyroidism, unspecified: Secondary | ICD-10-CM | POA: Diagnosis not present

## 2021-09-08 ENCOUNTER — Other Ambulatory Visit: Payer: Self-pay

## 2021-09-08 ENCOUNTER — Ambulatory Visit (INDEPENDENT_AMBULATORY_CARE_PROVIDER_SITE_OTHER): Payer: Medicare Other | Admitting: Family Medicine

## 2021-09-08 VITALS — BP 122/62 | HR 68 | Temp 98.0°F | Wt 211.8 lb

## 2021-09-08 DIAGNOSIS — G4700 Insomnia, unspecified: Secondary | ICD-10-CM

## 2021-09-08 MED ORDER — TRAZODONE HCL 50 MG PO TABS: ORAL_TABLET | ORAL | 5 refills | Status: DC

## 2021-09-08 NOTE — Progress Notes (Signed)
Established Patient Office Visit  Subjective:  Patient ID: Matthew Pratt, male    DOB: 10/02/1951  Age: 70 y.o. MRN: [id number]  CC:  Chief Complaint  Patient presents with   Insomnia    Trouble falling asleep and staying asleep, would like to discuss trazodone     HPI Matthew Pratt presents for insomnia issues.  He has been struggling with this for some time.  He has difficulty both falling asleep and staying asleep.  He does have history of obstructive apnea and currently not using CPAP.  He states he needs to get his machine cleaned.  Does have significant daytime fatigue.  Occasional naps during the day.  Plans to go back to work soon.  He had recent visit with pharmacist and there have been discussion of possible low-dose trazodone use.  He does take Effexor 150 mg daily.  He has tried things like background noise without much improvement.  Tries to avoid bright lights at night.  Very infrequent alcohol use.  Past Medical History:  Diagnosis Date   Anxiety    Arthritis    BPH (benign prostatic hypertrophy)    CAD (coronary artery disease)    s/p STEMI 10/15/10 w/ Promus DES placed x1 in the first OM   Colon polyps    Complication of anesthesia    pt. reported 2015 penile inplant when getting some anesthesia had chest pain,they readjusted  the meds. and continued with surgery. Had no further problems di not have to follow up with anyone.   Depression    Depression with anxiety    Dupuytren's contracture of hand    ED (erectile dysfunction)    GERD (gastroesophageal reflux disease)    Glaucoma    HLD (hyperlipidemia)    Hypocontractile bladder    Hypothyroidism    MI (myocardial infarction) ([place])    denies   OSA on CPAP    Peyronie's disease    Rosacea    Skin cancer    BCC    Past Surgical History:  Procedure Laterality Date   CHOLECYSTECTOMY N/A 02/10/2021   Procedure: LAPAROSCOPIC CHOLECYSTECTOMY;  Surgeon: Coralie Keens, MD;  Location: [facility];   Service: General;  Laterality: N/A;   COLONOSCOPY WITH ESOPHAGOGASTRODUODENOSCOPY (EGD)     multiple   CORONARY STENT PLACEMENT     INNER EAR SURGERY     LAPAROSCOPIC CHOLECYSTECTOMY Bilateral 02/09/2021   Dr.blackmon   LASIK  02/1998   PENILE PROSTHESIS IMPLANT     TONSILECTOMY, ADENOIDECTOMY, BILATERAL MYRINGOTOMY AND TUBES     TONSILLECTOMY     VASECTOMY     WISDOM TOOTH EXTRACTION      Family History  Problem Relation Age of Onset   COPD Mother    Cancer Father        gallbladder cancer   Varicose Veins Father    Liver cancer Father    Basal cell carcinoma Father    Basal cell carcinoma Sister    Melanoma Sister    Rheum arthritis Maternal Grandmother    Heart failure Maternal Grandfather    Colon cancer Paternal Grandmother    Arthritis Paternal Grandfather    Colon cancer Paternal Grandfather    COPD Other     Social History   Socioeconomic History   Marital status: Divorced    Spouse name: Not on file   Number of children: 2   Years of education: Not on file   Highest education level: Not on file  Occupational History  Occupation: International aid/development worker    Comment: retired  Tobacco Use   Smoking status: Never   Smokeless tobacco: Never  Vaping Use   Vaping Use: Never used  Substance and Sexual Activity   Alcohol use: No   Drug use: No   Sexual activity: Not on file  Other Topics Concern   Not on file  Social History Narrative   Divorced, 2 children   Retired Chief Financial Officer   No EtOH, tobacco, drugs   Social Determinants of Radio broadcast assistant Strain: Low Risk    Difficulty of Paying Living Expenses: Not hard at all  Food Insecurity: No Food Insecurity   Worried About Charity fundraiser in the Last Year: Never true   Arboriculturist in the Last Year: Never true  Transportation Needs: No Transportation Needs   Lack of Transportation (Medical): No   Lack of Transportation (Non-Medical): No  Physical Activity: Insufficiently  Active   Days of Exercise per Week: 3 days   Minutes of Exercise per Session: 30 min  Stress: No Stress Concern Present   Feeling of Stress : Not at all  Social Connections: Moderately Isolated   Frequency of Communication with Friends and Family: More than three times a week   Frequency of Social Gatherings with Friends and Family: More than three times a week   Attends Religious Services: More than 4 times per year   Active Member of [employer] or Organizations: No   Attends Archivist Meetings: Never   Marital Status: Divorced  Human resources officer Violence: Not At Risk   Fear of Current or Ex-Partner: No   Emotionally Abused: No   Physically Abused: No   Sexually Abused: No    Outpatient Medications Prior to Visit  Medication Sig Dispense Refill   aspirin EC 81 MG tablet Take 81 mg by mouth every morning.     b complex vitamins capsule Take 1 capsule by mouth daily at 6 PM.     calcium carbonate (OS-CAL) 600 MG TABS tablet Take 600 mg by mouth daily.     Cholecalciferol (VITAMIN D3) 125 MCG (5000 UT) CAPS Take 5,000 Units by mouth daily at 6 PM.     ibuprofen (ADVIL) 200 MG tablet Take 200 mg by mouth at bedtime as needed (pain/sleep).     latanoprost (XALATAN) 0.005 % ophthalmic solution Place 1 drop into both eyes at bedtime.     metoCLOPramide (REGLAN) 10 MG tablet Take 1 tablet (10 mg total) by mouth every 6 (six) hours. 10 tablet 0   mirtazapine (REMERON) 7.5 MG tablet Take 7.5 mg by mouth at bedtime. Taking 1/4 of a tablet.     Omega-3 Fatty Acids (FISH OIL) 1000 MG CAPS Take 1 capsule by mouth daily.     omeprazole (PRILOSEC OTC) 20 MG tablet Take 20 mg by mouth daily at 6 PM.     SYNTHROID 137 MCG tablet TAKE 1 TABLET BY MOUTH EVERY DAY (Patient taking differently: Take 137 mcg by mouth daily before breakfast.) 90 tablet 1   timolol (TIMOPTIC) 0.5 % ophthalmic solution Place 1 drop into both eyes 2 (two) times daily.     TURMERIC PO Take 1,500 mg by mouth daily.      venlafaxine XR (EFFEXOR-XR) 37.5 MG 24 hr capsule TAKE 3 CAPSULES BY MOUTH EVERY DAY (Patient taking differently: Take 150 mg by mouth daily at 6 PM. Taking 37.5 mg in the morning and 112.5 mg in the evening.)  270 capsule 3   zinc gluconate 50 MG tablet Take 50 mg by mouth daily at 6 PM.     No facility-administered medications prior to visit.    Allergies  Allergen Reactions   Crestor [Rosuvastatin] Other (See Comments)    Myalgia.     Iodinated Diagnostic Agents Hives and Itching   Sulfa Antibiotics Rash   Sulfonamide Derivatives Rash    ROS Review of Systems  Constitutional:  Negative for chills and fever.  Psychiatric/Behavioral:  Positive for sleep disturbance. Negative for agitation and confusion.      Objective:    Physical Exam Vitals reviewed.  Constitutional:      Appearance: Normal appearance.  Cardiovascular:     Rate and Rhythm: Normal rate and regular rhythm.  Pulmonary:     Effort: Pulmonary effort is normal.     Breath sounds: Normal breath sounds.  Neurological:     Mental Status: He is alert.  Psychiatric:        Mood and Affect: Mood normal.        Behavior: Behavior normal.        Thought Content: Thought content normal.        Judgment: Judgment normal.    BP 122/62 (BP Location: Left Arm, Patient Position: Sitting, Cuff Size: Normal)   Pulse 68   Temp 98 F (36.7 C) (Oral)   Wt 211 lb 12.8 oz (96.1 kg)   SpO2 98%   BMI 27.94 kg/m  Wt Readings from Last 3 Encounters:  09/08/21 211 lb 12.8 oz (96.1 kg)  07/01/21 214 lb (97.1 kg)  05/18/21 220 lb 11.2 oz (100.1 kg)     There are no preventive care reminders to display for this patient.  There are no preventive care reminders to display for this patient.  Lab Results  Component Value Date   TSH 0.44 02/19/2021   Lab Results  Component Value Date   WBC 4.3 02/10/2021   HGB 14.0 02/10/2021   HCT 40.4 02/10/2021   MCV 101.0 (H) 02/10/2021   PLT 203 02/10/2021   Lab Results   Component Value Date   NA 138 02/10/2021   K 4.2 02/10/2021   CO2 26 02/10/2021   GLUCOSE 102 (H) 02/10/2021   BUN 17 02/10/2021   CREATININE 1.12 02/10/2021   BILITOT 1.0 03/25/2021   ALKPHOS 79 03/25/2021   AST 28 03/25/2021   ALT 33 03/25/2021   PROT 7.0 03/25/2021   ALBUMIN 4.6 03/25/2021   CALCIUM 8.9 02/10/2021   ANIONGAP 7 02/10/2021   GFR 69.70 11/10/2020   Lab Results  Component Value Date   CHOL 163 03/25/2021   Lab Results  Component Value Date   HDL 59 03/25/2021   Lab Results  Component Value Date   LDLCALC 86 03/25/2021   Lab Results  Component Value Date   TRIG 101 03/25/2021   Lab Results  Component Value Date   CHOLHDL 2.8 03/25/2021   Lab Results  Component Value Date   HGBA1C 5.4 04/29/2021      Assessment & Plan:   Problem List Items Addressed This Visit   None Visit Diagnoses     Insomnia, unspecified type    -  Primary     We discussed sleep hygiene in some detail.  Recommend avoiding daytime naps.  No caffeine use after 1-2 PM.  Avoid regular use of alcohol.   We discussed trial of low-dose trazodone 25 mg nightly and if no improvement in 1 week may  titrate to 50 mg.  Would avoid higher doses of trazodone and that because of his current Effexor use.  Feel risk of serotonin syndrome will be low with low-dose trazodone  Handout on insomnia given  Meds ordered this encounter  Medications   traZODone (DESYREL) 50 MG tablet    Sig: Take one half to one tablet by mouth at night as needed for insomnia    Dispense:  30 tablet    Refill:  5    Follow-up: No follow-ups on file.    [name], MD

## 2021-09-23 DIAGNOSIS — L299 Pruritus, unspecified: Secondary | ICD-10-CM | POA: Diagnosis not present

## 2021-09-23 DIAGNOSIS — L508 Other urticaria: Secondary | ICD-10-CM | POA: Diagnosis not present

## 2021-09-23 DIAGNOSIS — T63441A Toxic effect of venom of bees, accidental (unintentional), initial encounter: Secondary | ICD-10-CM | POA: Diagnosis not present

## 2021-09-24 ENCOUNTER — Encounter: Payer: Self-pay | Admitting: Family Medicine

## 2021-09-30 ENCOUNTER — Ambulatory Visit (INDEPENDENT_AMBULATORY_CARE_PROVIDER_SITE_OTHER): Payer: Medicare Other | Admitting: Family Medicine

## 2021-09-30 ENCOUNTER — Other Ambulatory Visit: Payer: Self-pay

## 2021-09-30 VITALS — BP 108/62 | HR 45 | Temp 97.8°F | Wt 208.6 lb

## 2021-09-30 DIAGNOSIS — Z91030 Bee allergy status: Secondary | ICD-10-CM

## 2021-09-30 DIAGNOSIS — F5104 Psychophysiologic insomnia: Secondary | ICD-10-CM

## 2021-09-30 DIAGNOSIS — R739 Hyperglycemia, unspecified: Secondary | ICD-10-CM

## 2021-09-30 LAB — POCT GLYCOSYLATED HEMOGLOBIN (HGB A1C): Hemoglobin A1C: 5.4 % (ref 4.0–5.6)

## 2021-09-30 NOTE — Patient Instructions (Signed)
A1C is 5.4%  Keep up the good work.

## 2021-09-30 NOTE — Progress Notes (Signed)
Established Patient Office Visit  Subjective:  Patient ID: Matthew Pratt, male    DOB: 1951-10-10  Age: 70 y.o. MRN: [id number]  CC:  Chief Complaint  Patient presents with   Follow-up    Follow up on allergic reaction to bee stings.     HPI Matthew Pratt presents to discuss several issues as follows  Recent allergic reaction with bee sting.  He states that he was outdoors and felt a sensation left side of neck.  He went up to reach and felt a stinging sensation.  He initially thought this was some sort of spider bite.  He had some sort of bee which he described initially as a "hornet ".  This could have been a yellowjacket -he was not sure.  In any event he developed over the next few minutes some nausea and dizziness and even had what sounds like transient syncope.  He was aware of some hand swelling bilaterally.  He apparently came here initially and was sent by front office to local urgent care.  At that point he apparently was not having dizziness or any respiratory distress.  He states he was given Benadryl IM and orally at the urgent care but apparently did not receive any epinephrine initially.  He was given a prescription for epinephrine at that visit and by the time he got home he had some generalized hives and did take 1 EpiPen.  Was reportedly not given any steroids.  Never developed any shortness of breath.  No prior history of bee sting allergy.  He denied any tongue or lip edema or any foot edema.  After a couple of days his hives resolved.  He continued with some Benadryl.  He is requesting repeat A1c.  History of prediabetes range blood sugars.  No polyuria or polydipsia.  He has made some conscious dietary changes and has lost about 12 pounds since June.  He has scaled back sugars and starches.  Has some ongoing insomnia issues.  Takes trazodone intermittently.  He apparently is taking his Effexor predominantly at night and his counselor had suggested that he take  this during the day.  Past Medical History:  Diagnosis Date   Anxiety    Arthritis    BPH (benign prostatic hypertrophy)    CAD (coronary artery disease)    s/p STEMI 10/15/10 w/ Promus DES placed x1 in the first OM   Colon polyps    Complication of anesthesia    pt. reported 2015 penile inplant when getting some anesthesia had chest pain,they readjusted  the meds. and continued with surgery. Had no further problems di not have to follow up with anyone.   Depression    Depression with anxiety    Dupuytren's contracture of hand    ED (erectile dysfunction)    GERD (gastroesophageal reflux disease)    Glaucoma    HLD (hyperlipidemia)    Hypocontractile bladder    Hypothyroidism    MI (myocardial infarction) ([place])    denies   OSA on CPAP    Peyronie's disease    Rosacea    Skin cancer    BCC    Past Surgical History:  Procedure Laterality Date   CHOLECYSTECTOMY N/A 02/10/2021   Procedure: LAPAROSCOPIC CHOLECYSTECTOMY;  Surgeon: Coralie Keens, MD;  Location: [facility];  Service: General;  Laterality: N/A;   COLONOSCOPY WITH ESOPHAGOGASTRODUODENOSCOPY (EGD)     multiple   CORONARY STENT PLACEMENT     INNER EAR SURGERY  LAPAROSCOPIC CHOLECYSTECTOMY Bilateral 02/09/2021   Dr.blackmon   LASIK  02/1998   PENILE PROSTHESIS IMPLANT     TONSILECTOMY, ADENOIDECTOMY, BILATERAL MYRINGOTOMY AND TUBES     TONSILLECTOMY     VASECTOMY     WISDOM TOOTH EXTRACTION      Family History  Problem Relation Age of Onset   COPD Mother    Cancer Father        gallbladder cancer   Varicose Veins Father    Liver cancer Father    Basal cell carcinoma Father    Basal cell carcinoma Sister    Melanoma Sister    Rheum arthritis Maternal Grandmother    Heart failure Maternal Grandfather    Colon cancer Paternal Grandmother    Arthritis Paternal Grandfather    Colon cancer Paternal Grandfather    COPD Other     Social History   Socioeconomic History   Marital status: Divorced     Spouse name: Not on file   Number of children: 2   Years of education: Not on file   Highest education level: Not on file  Occupational History   Occupation: International aid/development worker    Comment: retired  Tobacco Use   Smoking status: Never   Smokeless tobacco: Never  Vaping Use   Vaping Use: Never used  Substance and Sexual Activity   Alcohol use: No   Drug use: No   Sexual activity: Not on file  Other Topics Concern   Not on file  Social History Narrative   Divorced, 2 children   Retired Chief Financial Officer   No EtOH, tobacco, drugs   Social Determinants of Radio broadcast assistant Strain: Low Risk    Difficulty of Paying Living Expenses: Not hard at all  Food Insecurity: No Food Insecurity   Worried About Charity fundraiser in the Last Year: Never true   Arboriculturist in the Last Year: Never true  Transportation Needs: No Transportation Needs   Lack of Transportation (Medical): No   Lack of Transportation (Non-Medical): No  Physical Activity: Insufficiently Active   Days of Exercise per Week: 3 days   Minutes of Exercise per Session: 30 min  Stress: No Stress Concern Present   Feeling of Stress : Not at all  Social Connections: Moderately Isolated   Frequency of Communication with Friends and Family: More than three times a week   Frequency of Social Gatherings with Friends and Family: More than three times a week   Attends Religious Services: More than 4 times per year   Active Member of [employer] or Organizations: No   Attends Archivist Meetings: Never   Marital Status: Divorced  Human resources officer Violence: Not At Risk   Fear of Current or Ex-Partner: No   Emotionally Abused: No   Physically Abused: No   Sexually Abused: No    Outpatient Medications Prior to Visit  Medication Sig Dispense Refill   aspirin EC 81 MG tablet Take 81 mg by mouth every morning.     b complex vitamins capsule Take 1 capsule by mouth daily at 6 PM.     calcium carbonate  (OS-CAL) 600 MG TABS tablet Take 600 mg by mouth daily.     Cholecalciferol (VITAMIN D3) 125 MCG (5000 UT) CAPS Take 5,000 Units by mouth daily at 6 PM.     ibuprofen (ADVIL) 200 MG tablet Take 200 mg by mouth at bedtime as needed (pain/sleep).     latanoprost (XALATAN)  0.005 % ophthalmic solution Place 1 drop into both eyes at bedtime.     metoCLOPramide (REGLAN) 10 MG tablet Take 1 tablet (10 mg total) by mouth every 6 (six) hours. 10 tablet 0   Omega-3 Fatty Acids (FISH OIL) 1000 MG CAPS Take 1 capsule by mouth daily.     omeprazole (PRILOSEC OTC) 20 MG tablet Take 20 mg by mouth daily at 6 PM.     SYNTHROID 137 MCG tablet TAKE 1 TABLET BY MOUTH EVERY DAY (Patient taking differently: Take 137 mcg by mouth daily before breakfast.) 90 tablet 1   timolol (TIMOPTIC) 0.5 % ophthalmic solution Place 1 drop into both eyes 2 (two) times daily.     traZODone (DESYREL) 50 MG tablet Take one half to one tablet by mouth at night as needed for insomnia 30 tablet 5   TURMERIC PO Take 1,500 mg by mouth daily.     venlafaxine XR (EFFEXOR-XR) 37.5 MG 24 hr capsule TAKE 3 CAPSULES BY MOUTH EVERY DAY (Patient taking differently: Take 150 mg by mouth daily at 6 PM. Taking 37.5 mg in the morning and 112.5 mg in the evening.) 270 capsule 3   zinc gluconate 50 MG tablet Take 50 mg by mouth daily at 6 PM.     mirtazapine (REMERON) 7.5 MG tablet Take 7.5 mg by mouth at bedtime. Taking 1/4 of a tablet.     No facility-administered medications prior to visit.    Allergies  Allergen Reactions   Bee Venom Hives and Swelling   Crestor [Rosuvastatin] Other (See Comments)    Myalgia.     Iodinated Diagnostic Agents Hives and Itching   Sulfa Antibiotics Rash   Sulfonamide Derivatives Rash    ROS Review of Systems  Constitutional:  Negative for chills, fatigue, fever and unexpected weight change.  Eyes:  Negative for visual disturbance.  Respiratory:  Negative for cough, chest tightness and shortness of breath.    Cardiovascular:  Negative for chest pain, palpitations and leg swelling.  Genitourinary:  Negative for dysuria.  Neurological:  Negative for dizziness, syncope, weakness, light-headedness and headaches.     Objective:    Physical Exam Vitals reviewed.  Constitutional:      Appearance: Normal appearance.  Cardiovascular:     Rate and Rhythm: Normal rate and regular rhythm.  Pulmonary:     Effort: Pulmonary effort is normal.     Breath sounds: Normal breath sounds.  Musculoskeletal:     Cervical back: Neck supple.     Right lower leg: No edema.     Left lower leg: No edema.  Neurological:     General: No focal deficit present.     Mental Status: He is alert.    BP 108/62 (BP Location: Left Arm, Patient Position: Sitting, Cuff Size: Normal)   Pulse (!) 45   Temp 97.8 F (36.6 C) (Oral)   Wt 208 lb 9.6 oz (94.6 kg)   SpO2 99%   BMI 27.52 kg/m  Wt Readings from Last 3 Encounters:  09/30/21 208 lb 9.6 oz (94.6 kg)  09/08/21 211 lb 12.8 oz (96.1 kg)  07/01/21 214 lb (97.1 kg)     There are no preventive care reminders to display for this patient.  There are no preventive care reminders to display for this patient.  Lab Results  Component Value Date   TSH 0.44 02/19/2021   Lab Results  Component Value Date   WBC 4.3 02/10/2021   HGB 14.0 02/10/2021   HCT 40.4  02/10/2021   MCV 101.0 (H) 02/10/2021   PLT 203 02/10/2021   Lab Results  Component Value Date   NA 138 02/10/2021   K 4.2 02/10/2021   CO2 26 02/10/2021   GLUCOSE 102 (H) 02/10/2021   BUN 17 02/10/2021   CREATININE 1.12 02/10/2021   BILITOT 1.0 03/25/2021   ALKPHOS 79 03/25/2021   AST 28 03/25/2021   ALT 33 03/25/2021   PROT 7.0 03/25/2021   ALBUMIN 4.6 03/25/2021   CALCIUM 8.9 02/10/2021   ANIONGAP 7 02/10/2021   GFR 69.70 11/10/2020   Lab Results  Component Value Date   CHOL 163 03/25/2021   Lab Results  Component Value Date   HDL 59 03/25/2021   Lab Results  Component Value Date    LDLCALC 86 03/25/2021   Lab Results  Component Value Date   TRIG 101 03/25/2021   Lab Results  Component Value Date   CHOLHDL 2.8 03/25/2021   Lab Results  Component Value Date   HGBA1C 5.4 04/29/2021      Assessment & Plan:   #1 recent bee sting allergy.  He is not sure which type of bee but apparently developed generalized hives and even had syncopal episode and some hand swelling with suggest possible mild anaphylaxis.  He was not aware of any respiratory difficulties with wheezing, dyspnea, tongue swelling, lip swelling, etc. -He did receive prescription for epinephrine -We had a long discussion regarding potential seriousness including risk of death with severe bee sting reaction.  He is aware with future episodes that even if he requires EpiPen to still seek ER evaluation -We also discussed keeping some Benadryl with him at all times.  #2 history of prediabetes range blood sugars.  A1c repeated today and 5.4 which is stable. -Continue low glycemic diet -Consider monitoring A1c every 6 months to 1 year  #3 chronic insomnia.  Using trazodone intermittently.  He has been taking his Effexor at night and we agree that it may be better to shift this to morning time use to avoid any stimulating effects from the norepinephrine.   No orders of the defined types were placed in this encounter.   Follow-up: No follow-ups on file.    [name], MD

## 2021-10-02 ENCOUNTER — Other Ambulatory Visit: Payer: Self-pay | Admitting: Family Medicine

## 2021-10-02 NOTE — Telephone Encounter (Signed)
Ok to change to 90 day supply

## 2021-10-06 ENCOUNTER — Telehealth: Payer: Self-pay | Admitting: Pharmacist

## 2021-10-06 NOTE — Chronic Care Management (AMB) (Signed)
Chronic Care Management Pharmacy Assistant   Name: Matthew Pratt  MRN: [id number] DOB: Dec 23, 1950  Reason for Encounter: Disease State / Hypothyroidism and Hyperlipidemia Assessment Call   Conditions to be addressed/monitored: HLD and Hypothyroidism  Recent office visits:  09/30/2021 Matthew Littler MD(PCP) - Patient was seen for bee sting allergy and additional issues. Discontinued Mirtazapine. No follow up noted.   09/08/2021 Matthew Littler MD(PCP) - Patient was seen for insomnia. Started on Trazodone 50 mg 1/2 to 1 tablet at bedtime prn. No follow up noted.   Recent consult visits:  None  Hospital visits:  None  Medications: Outpatient Encounter Medications as of 10/06/2021  Medication Sig   aspirin EC 81 MG tablet Take 81 mg by mouth every morning.   b complex vitamins capsule Take 1 capsule by mouth daily at 6 PM.   calcium carbonate (OS-CAL) 600 MG TABS tablet Take 600 mg by mouth daily.   Cholecalciferol (VITAMIN D3) 125 MCG (5000 UT) CAPS Take 5,000 Units by mouth daily at 6 PM.   ibuprofen (ADVIL) 200 MG tablet Take 200 mg by mouth at bedtime as needed (pain/sleep).   latanoprost (XALATAN) 0.005 % ophthalmic solution Place 1 drop into both eyes at bedtime.   metoCLOPramide (REGLAN) 10 MG tablet Take 1 tablet (10 mg total) by mouth every 6 (six) hours.   Omega-3 Fatty Acids (FISH OIL) 1000 MG CAPS Take 1 capsule by mouth daily.   omeprazole (PRILOSEC OTC) 20 MG tablet Take 20 mg by mouth daily at 6 PM.   SYNTHROID 137 MCG tablet TAKE 1 TABLET BY MOUTH EVERY DAY (Patient taking differently: Take 137 mcg by mouth daily before breakfast.)   timolol (TIMOPTIC) 0.5 % ophthalmic solution Place 1 drop into both eyes 2 (two) times daily.   traZODone (DESYREL) 50 MG tablet TAKE 1/2 TO 1 TABLET BY MOUTH AT NIGHT AS NEEDED FOR INSOMNIA   TURMERIC PO Take 1,500 mg by mouth daily.   venlafaxine XR (EFFEXOR-XR) 37.5 MG 24 hr capsule TAKE 3 CAPSULES BY MOUTH EVERY DAY (Patient  taking differently: Take 150 mg by mouth daily at 6 PM. Taking 37.5 mg in the morning and 112.5 mg in the evening.)   zinc gluconate 50 MG tablet Take 50 mg by mouth daily at 6 PM.   No facility-administered encounter medications on file as of 10/06/2021.   Fill History:  EPINEPHRINE 0.3 MG AUTO-INJECT 09/23/2021 30   LATANOPROST 0.005% EYE DROPS 08/27/2021 75   SYNTHROID 137 MCG TABLET 09/04/2021 90   OMEPRAZOLE DR 10 MG CAPSULE 08/27/2021 90   VENLAFAXINE HCL ER 37.5 MG CAP 07/25/2021 90   TRAZODONE 50 MG TABLET 09/08/2021 30   10/06/2021 Name: Matthew Pratt MRN: [id number] DOB: [birth year] Matthew Pratt is a 70 y.o. year old male who is a primary care patient of Matthew Post, MD.  Comprehensive medication review performed; Spoke to patient regarding cholesterol  Lipid Panel    Component Value Date/Time   CHOL 163 03/25/2021 0954   TRIG 101 03/25/2021 0954   HDL 59 03/25/2021 0954   LDLCALC 86 03/25/2021 0954    10-year ASCVD risk score: The ASCVD Risk score (Arnett DK, et al., 2019) failed to calculate for the following reasons:   The patient has a prior MI or stroke diagnosis  Current antihyperlipidemic regimen:  None  Previous antihyperlipidemic medications tried: Fish oil, Livalo, pravastatin.  ASCVD risk enhancing conditions: age >50  What recent interventions/DTPs have been made by any provider  to improve Cholesterol control since last CPP Visit: None  Any recent hospitalizations or ED visits since last visit with CPP? No  What diet changes have been made to improve Cholesterol?  Patient is eating a very low carb diet, he is eating two meals per day. If he eats breakfast it is a couple eggs. Lunch/dinner is vegetables and protein, his proteins are ground beef, chicken, beans and fish when he can.   What exercise is being done to improve Cholesterol?  Since his bee sting his activity has been limited however, he is up and moving everyday.  Adherence  Review: Does the patient have >5 day gap between last estimated fill dates? No  Notes:  Patient states he is no longer taking Reglan, he has not taken this in a while and is asking it to be removed from his medication list. Patient mentioned he is having a little itching rash on his neck since his bee sting, he is not sure if it is still from the bee sting or possibly a medication, he states he did take Benadryl last night and it settled it. He was advised if the rash returns or gets any worse to contact Dr. Elease Pratt for further evaluation.  Patient agree's.  Care Gaps: AWV - completed 04/17/2021 Last BP - 108/62 on 09/30/2021  Star Rating Drugs: None  Matthew Pratt  Clinical Pharmacist Assistant [phone]

## 2021-10-21 ENCOUNTER — Other Ambulatory Visit: Payer: Self-pay | Admitting: Family Medicine

## 2021-11-02 ENCOUNTER — Ambulatory Visit (INDEPENDENT_AMBULATORY_CARE_PROVIDER_SITE_OTHER): Payer: Medicare Other | Admitting: Family Medicine

## 2021-11-02 VITALS — BP 100/50 | HR 55 | Temp 97.8°F | Wt 208.5 lb

## 2021-11-02 DIAGNOSIS — E039 Hypothyroidism, unspecified: Secondary | ICD-10-CM | POA: Diagnosis not present

## 2021-11-02 DIAGNOSIS — G720 Drug-induced myopathy: Secondary | ICD-10-CM

## 2021-11-02 DIAGNOSIS — F5104 Psychophysiologic insomnia: Secondary | ICD-10-CM | POA: Diagnosis not present

## 2021-11-02 DIAGNOSIS — T466X5A Adverse effect of antihyperlipidemic and antiarteriosclerotic drugs, initial encounter: Secondary | ICD-10-CM

## 2021-11-02 NOTE — Progress Notes (Addendum)
Established Patient Office Visit  Subjective:  Patient ID: Matthew Pratt, male    DOB: [birth year]  Age: 70 y.o. MRN: [id number]  CC:  Chief Complaint  Patient presents with   Follow-up    HPI Matthew Pratt presents for medical follow-up.  He had recent bee sting allergy with syncopal episode.  He does have EpiPen at this time.  No further syncopal episodes.  History of hyperglycemia.  Recent A1c last visit 5.4%.  He is conscious of sugars and starches.  Chronic insomnia.  Currently doing well with intermittent trazodone.  He has shifted his Effexor to daytime use which may be making some difference.  He has hypothyroidism on replacement and had labs 8 months ago which were normal.  Last lipids last April.  Previous cholecystectomy.  Occasional loose stools with heavy meals but not consistently.  Past Medical History:  Diagnosis Date   Anxiety    Arthritis    BPH (benign prostatic hypertrophy)    CAD (coronary artery disease)    s/p STEMI 10/15/10 w/ Promus DES placed x1 in the first OM   Colon polyps    Complication of anesthesia    pt. reported 2015 penile inplant when getting some anesthesia had chest pain,they readjusted  the meds. and continued with surgery. Had no further problems di not have to follow up with anyone.   Depression    Depression with anxiety    Dupuytren's contracture of hand    ED (erectile dysfunction)    GERD (gastroesophageal reflux disease)    Glaucoma    HLD (hyperlipidemia)    Hypocontractile bladder    Hypothyroidism    MI (myocardial infarction) ([place])    denies   OSA on CPAP    Peyronie's disease    Rosacea    Skin cancer    BCC    Past Surgical History:  Procedure Laterality Date   CHOLECYSTECTOMY N/A 02/10/2021   Procedure: LAPAROSCOPIC CHOLECYSTECTOMY;  Surgeon: Coralie Keens, MD;  Location: [facility];  Service: General;  Laterality: N/A;   COLONOSCOPY WITH ESOPHAGOGASTRODUODENOSCOPY (EGD)     multiple   CORONARY STENT  PLACEMENT     INNER EAR SURGERY     LAPAROSCOPIC CHOLECYSTECTOMY Bilateral 02/09/2021   Dr.blackmon   LASIK  02/1998   PENILE PROSTHESIS IMPLANT     TONSILECTOMY, ADENOIDECTOMY, BILATERAL MYRINGOTOMY AND TUBES     TONSILLECTOMY     VASECTOMY     WISDOM TOOTH EXTRACTION      Family History  Problem Relation Age of Onset   COPD Mother    Cancer Father        gallbladder cancer   Varicose Veins Father    Liver cancer Father    Basal cell carcinoma Father    Basal cell carcinoma Sister    Melanoma Sister    Rheum arthritis Maternal Grandmother    Heart failure Maternal Grandfather    Colon cancer Paternal Grandmother    Arthritis Paternal Grandfather    Colon cancer Paternal Grandfather    COPD Other     Social History   Socioeconomic History   Marital status: Divorced    Spouse name: Not on file   Number of children: 2   Years of education: Not on file   Highest education level: Bachelor's degree (e.g., BA, AB, BS)  Occupational History   Occupation: International aid/development worker    Comment: retired  Tobacco Use   Smoking status: Never   Smokeless tobacco: Never  Vaping Use   Vaping Use: Never used  Substance and Sexual Activity   Alcohol use: No   Drug use: No   Sexual activity: Not on file  Other Topics Concern   Not on file  Social History Narrative   Divorced, 2 children   Retired Chief Financial Officer   No EtOH, tobacco, drugs   Social Determinants of Radio broadcast assistant Strain: Low Risk    Difficulty of Paying Living Expenses: Not very hard  Food Insecurity: Landscape architect Present   Worried About Charity fundraiser in the Last Year: Never true   Arboriculturist in the Last Year: Sometimes true  Transportation Needs: No Transportation Needs   Lack of Transportation (Medical): No   Lack of Transportation (Non-Medical): No  Physical Activity: Inactive   Days of Exercise per Week: 0 days   Minutes of Exercise per Session: 30 min  Stress: Stress  Concern Present   Feeling of Stress : To some extent  Social Connections: Moderately Integrated   Frequency of Communication with Friends and Family: Once a week   Frequency of Social Gatherings with Friends and Family: Twice a week   Attends Religious Services: More than 4 times per year   Active Member of [employer] or Organizations: Yes   Attends Music therapist: More than 4 times per year   Marital Status: Divorced  Human resources officer Violence: Not At Risk   Fear of Current or Ex-Partner: No   Emotionally Abused: No   Physically Abused: No   Sexually Abused: No    Outpatient Medications Prior to Visit  Medication Sig Dispense Refill   aspirin EC 81 MG tablet Take 81 mg by mouth every morning.     b complex vitamins capsule Take 1 capsule by mouth daily at 6 PM.     calcium carbonate (OS-CAL) 600 MG TABS tablet Take 600 mg by mouth daily.     Cholecalciferol (VITAMIN D3) 125 MCG (5000 UT) CAPS Take 5,000 Units by mouth daily at 6 PM.     ibuprofen (ADVIL) 200 MG tablet Take 200 mg by mouth at bedtime as needed (pain/sleep).     latanoprost (XALATAN) 0.005 % ophthalmic solution Place 1 drop into both eyes at bedtime.     metoCLOPramide (REGLAN) 10 MG tablet Take 1 tablet (10 mg total) by mouth every 6 (six) hours. 10 tablet 0   Omega-3 Fatty Acids (FISH OIL) 1000 MG CAPS Take 1 capsule by mouth daily.     omeprazole (PRILOSEC OTC) 20 MG tablet Take 20 mg by mouth daily at 6 PM.     SYNTHROID 137 MCG tablet TAKE 1 TABLET BY MOUTH EVERY DAY (Patient taking differently: Take 137 mcg by mouth daily before breakfast.) 90 tablet 1   timolol (TIMOPTIC) 0.5 % ophthalmic solution Place 1 drop into both eyes 2 (two) times daily.     traZODone (DESYREL) 50 MG tablet TAKE 1/2 TO 1 TABLET BY MOUTH AT NIGHT AS NEEDED FOR INSOMNIA 90 tablet 0   TURMERIC PO Take 1,500 mg by mouth daily.     venlafaxine XR (EFFEXOR-XR) 37.5 MG 24 hr capsule TAKE 3 CAPSULES BY MOUTH EVERY DAY 270 capsule 3    zinc gluconate 50 MG tablet Take 50 mg by mouth daily at 6 PM.     No facility-administered medications prior to visit.    Allergies  Allergen Reactions   Bee Venom Hives and Swelling   Crestor [Rosuvastatin] Other (See Comments)  Myalgia.     Iodinated Diagnostic Agents Hives and Itching   Sulfa Antibiotics Rash   Sulfonamide Derivatives Rash    ROS Review of Systems  Constitutional:  Negative for fatigue.  Eyes:  Negative for visual disturbance.  Respiratory:  Negative for cough, chest tightness and shortness of breath.   Cardiovascular:  Negative for chest pain, palpitations and leg swelling.  Endocrine: Negative for polydipsia and polyuria.  Neurological:  Negative for dizziness, syncope, weakness, light-headedness and headaches.     Objective:    Physical Exam Vitals reviewed.  Constitutional:      Appearance: He is well-developed.  HENT:     Right Ear: External ear normal.     Left Ear: External ear normal.  Eyes:     Pupils: Pupils are equal, round, and reactive to light.  Neck:     Thyroid: No thyromegaly.  Cardiovascular:     Rate and Rhythm: Normal rate and regular rhythm.  Pulmonary:     Effort: Pulmonary effort is normal. No respiratory distress.     Breath sounds: Normal breath sounds. No wheezing or rales.  Musculoskeletal:     Cervical back: Neck supple.  Neurological:     General: No focal deficit present.     Mental Status: He is alert and oriented to person, place, and time.  Psychiatric:        Mood and Affect: Mood normal.    BP (!) 100/50 (BP Location: Left Arm, Patient Position: Sitting, Cuff Size: Normal)   Pulse (!) 55   Temp 97.8 F (36.6 C) (Oral)   Wt 208 lb 8 oz (94.6 kg)   SpO2 98%   BMI 27.51 kg/m  Wt Readings from Last 3 Encounters:  11/02/21 208 lb 8 oz (94.6 kg)  09/30/21 208 lb 9.6 oz (94.6 kg)  09/08/21 211 lb 12.8 oz (96.1 kg)     Health Maintenance Due  Topic Date Due   COVID-19 Vaccine (6 - Booster for  Pfizer series) 10/13/2021    There are no preventive care reminders to display for this patient.  Lab Results  Component Value Date   TSH 0.44 02/19/2021   Lab Results  Component Value Date   WBC 4.3 02/10/2021   HGB 14.0 02/10/2021   HCT 40.4 02/10/2021   MCV 101.0 (H) 02/10/2021   PLT 203 02/10/2021   Lab Results  Component Value Date   NA 138 02/10/2021   K 4.2 02/10/2021   CO2 26 02/10/2021   GLUCOSE 102 (H) 02/10/2021   BUN 17 02/10/2021   CREATININE 1.12 02/10/2021   BILITOT 1.0 03/25/2021   ALKPHOS 79 03/25/2021   AST 28 03/25/2021   ALT 33 03/25/2021   PROT 7.0 03/25/2021   ALBUMIN 4.6 03/25/2021   CALCIUM 8.9 02/10/2021   ANIONGAP 7 02/10/2021   GFR 69.70 11/10/2020   Lab Results  Component Value Date   CHOL 163 03/25/2021   Lab Results  Component Value Date   HDL 59 03/25/2021   Lab Results  Component Value Date   LDLCALC 86 03/25/2021   Lab Results  Component Value Date   TRIG 101 03/25/2021   Lab Results  Component Value Date   CHOLHDL 2.8 03/25/2021   Lab Results  Component Value Date   HGBA1C 5.4 09/30/2021      Assessment & Plan:   #1 chronic insomnia.  Currently doing well with low-dose trazodone as needed.  Continue to avoid late the use of caffeine and daytime napping.  #  2 hypothyroidism.  Patient on replacement.  Recheck TSH at next follow-up in 4 to 6 months  #3 hx of statin induced myalgia.     No orders of the defined types were placed in this encounter.   Follow-up: Return in about 6 months (around 05/02/2022).    [name], MD

## 2021-11-17 ENCOUNTER — Encounter: Payer: Self-pay | Admitting: Family Medicine

## 2021-11-21 ENCOUNTER — Other Ambulatory Visit: Payer: Self-pay | Admitting: Family Medicine

## 2021-11-23 ENCOUNTER — Ambulatory Visit (INDEPENDENT_AMBULATORY_CARE_PROVIDER_SITE_OTHER): Payer: Medicare Other | Admitting: Family Medicine

## 2021-11-23 VITALS — BP 118/62 | HR 53 | Temp 97.6°F | Wt 209.3 lb

## 2021-11-23 DIAGNOSIS — R110 Nausea: Secondary | ICD-10-CM | POA: Diagnosis not present

## 2021-11-23 NOTE — Progress Notes (Signed)
Established Patient Office Visit  Subjective:  Patient ID: Matthew Pratt, male    DOB: 12-10-1950  Age: 70 y.o. MRN: [id number]  CC:  Chief Complaint  Patient presents with   Nausea    X 2 weeks, has been consistent, thinks this is from taking acetaminophen.     HPI Matthew Pratt presents for nausea for the past couple weeks.  Symptoms have been almost daily but actually somewhat better past 3 days.  He has been having some recent back pain and been taking ibuprofen fairly consistently.  He stopped this couple days ago and it plans to transition to acetaminophen.  Does take aspirin daily.  Occasional loose stools but no consistent diarrhea.  Mild upper abdominal pains but symptoms are very mild and inconsistent.  No vomiting.  No headaches.  Does have occasional postnasal drip when eating we states this is chronic.  No recent new medications other than the ibuprofen.  No bloody stools.  No melena.  Appetite and weight are stable.  Wt Readings from Last 3 Encounters:  11/23/21 209 lb 4.8 oz (94.9 kg)  11/02/21 208 lb 8 oz (94.6 kg)  09/30/21 208 lb 9.6 oz (94.6 kg)     Past Medical History:  Diagnosis Date   Anxiety    Arthritis    BPH (benign prostatic hypertrophy)    CAD (coronary artery disease)    s/p STEMI 10/15/10 w/ Promus DES placed x1 in the first OM   Colon polyps    Complication of anesthesia    pt. reported 2015 penile inplant when getting some anesthesia had chest pain,they readjusted  the meds. and continued with surgery. Had no further problems di not have to follow up with anyone.   Depression    Depression with anxiety    Dupuytren's contracture of hand    ED (erectile dysfunction)    GERD (gastroesophageal reflux disease)    Glaucoma    HLD (hyperlipidemia)    Hypocontractile bladder    Hypothyroidism    MI (myocardial infarction) ([place])    denies   OSA on CPAP    Peyronie's disease    Rosacea    Skin cancer    BCC    Past Surgical History:   Procedure Laterality Date   CHOLECYSTECTOMY N/A 02/10/2021   Procedure: LAPAROSCOPIC CHOLECYSTECTOMY;  Surgeon: Coralie Keens, MD;  Location: [facility];  Service: General;  Laterality: N/A;   COLONOSCOPY WITH ESOPHAGOGASTRODUODENOSCOPY (EGD)     multiple   CORONARY STENT PLACEMENT     INNER EAR SURGERY     LAPAROSCOPIC CHOLECYSTECTOMY Bilateral 02/09/2021   Dr.blackmon   LASIK  02/1998   PENILE PROSTHESIS IMPLANT     TONSILECTOMY, ADENOIDECTOMY, BILATERAL MYRINGOTOMY AND TUBES     TONSILLECTOMY     VASECTOMY     WISDOM TOOTH EXTRACTION      Family History  Problem Relation Age of Onset   COPD Mother    Cancer Father        gallbladder cancer   Varicose Veins Father    Liver cancer Father    Basal cell carcinoma Father    Basal cell carcinoma Sister    Melanoma Sister    Rheum arthritis Maternal Grandmother    Heart failure Maternal Grandfather    Colon cancer Paternal Grandmother    Arthritis Paternal Grandfather    Colon cancer Paternal Grandfather    COPD Other     Social History   Socioeconomic History   Marital status:  Divorced    Spouse name: Not on file   Number of children: 2   Years of education: Not on file   Highest education level: Bachelor's degree (e.g., BA, AB, BS)  Occupational History   Occupation: International aid/development worker    Comment: retired  Tobacco Use   Smoking status: Never   Smokeless tobacco: Never  Vaping Use   Vaping Use: Never used  Substance and Sexual Activity   Alcohol use: No   Drug use: No   Sexual activity: Not on file  Other Topics Concern   Not on file  Social History Narrative   Divorced, 2 children   Retired Chief Financial Officer   No EtOH, tobacco, drugs   Social Determinants of Radio broadcast assistant Strain: Low Risk    Difficulty of Paying Living Expenses: Not very hard  Food Insecurity: Landscape architect Present   Worried About Charity fundraiser in the Last Year: Never true   Arboriculturist in the Last Year:  Sometimes true  Transportation Needs: No Transportation Needs   Lack of Transportation (Medical): No   Lack of Transportation (Non-Medical): No  Physical Activity: Inactive   Days of Exercise per Week: 0 days   Minutes of Exercise per Session: 30 min  Stress: Stress Concern Present   Feeling of Stress : To some extent  Social Connections: Moderately Integrated   Frequency of Communication with Friends and Family: Once a week   Frequency of Social Gatherings with Friends and Family: Twice a week   Attends Religious Services: More than 4 times per year   Active Member of [employer] or Organizations: Yes   Attends Music therapist: More than 4 times per year   Marital Status: Divorced  Human resources officer Violence: Not At Risk   Fear of Current or Ex-Partner: No   Emotionally Abused: No   Physically Abused: No   Sexually Abused: No    Outpatient Medications Prior to Visit  Medication Sig Dispense Refill   aspirin EC 81 MG tablet Take 81 mg by mouth every morning.     b complex vitamins capsule Take 1 capsule by mouth daily at 6 PM.     calcium carbonate (OS-CAL) 600 MG TABS tablet Take 600 mg by mouth daily.     Cholecalciferol (VITAMIN D3) 125 MCG (5000 UT) CAPS Take 5,000 Units by mouth daily at 6 PM.     latanoprost (XALATAN) 0.005 % ophthalmic solution Place 1 drop into both eyes at bedtime.     Omega-3 Fatty Acids (FISH OIL) 1000 MG CAPS Take 1 capsule by mouth daily.     omeprazole (PRILOSEC OTC) 20 MG tablet Take 20 mg by mouth daily at 6 PM.     SYNTHROID 137 MCG tablet TAKE 1 TABLET BY MOUTH EVERY DAY (Patient taking differently: Take 137 mcg by mouth daily before breakfast.) 90 tablet 1   timolol (TIMOPTIC) 0.5 % ophthalmic solution Place 1 drop into both eyes 2 (two) times daily.     traZODone (DESYREL) 50 MG tablet TAKE 1/2 TO 1 TABLET BY MOUTH AT NIGHT AS NEEDED FOR INSOMNIA 90 tablet 0   TURMERIC PO Take 1,500 mg by mouth daily.     venlafaxine XR (EFFEXOR-XR) 37.5  MG 24 hr capsule TAKE 3 CAPSULES BY MOUTH EVERY DAY 270 capsule 3   zinc gluconate 50 MG tablet Take 50 mg by mouth daily at 6 PM.     ibuprofen (ADVIL) 200 MG tablet  Take 200 mg by mouth at bedtime as needed (pain/sleep).     No facility-administered medications prior to visit.    Allergies  Allergen Reactions   Bee Venom Hives and Swelling   Crestor [Rosuvastatin] Other (See Comments)    Myalgia.     Iodinated Diagnostic Agents Hives and Itching   Sulfa Antibiotics Rash   Sulfonamide Derivatives Rash    ROS Review of Systems  Constitutional:  Negative for appetite change, chills, fever and unexpected weight change.  Respiratory:  Negative for shortness of breath.   Cardiovascular:  Negative for chest pain.  Gastrointestinal:  Positive for nausea. Negative for abdominal distention, blood in stool, diarrhea and vomiting.  Genitourinary:  Negative for dysuria.  Neurological:  Negative for headaches.     Objective:    Physical Exam Vitals reviewed.  Cardiovascular:     Rate and Rhythm: Normal rate and regular rhythm.  Pulmonary:     Effort: Pulmonary effort is normal.     Breath sounds: Normal breath sounds.  Abdominal:     General: Bowel sounds are normal. There is no distension.     Palpations: There is no mass.     Tenderness: There is no abdominal tenderness. There is no guarding or rebound.  Neurological:     Mental Status: He is alert.    BP 118/62 (BP Location: Left Arm, Patient Position: Sitting, Cuff Size: Normal)    Pulse (!) 53    Temp 97.6 F (36.4 C) (Oral)    Wt 209 lb 4.8 oz (94.9 kg)    SpO2 99%    BMI 27.61 kg/m  Wt Readings from Last 3 Encounters:  11/23/21 209 lb 4.8 oz (94.9 kg)  11/02/21 208 lb 8 oz (94.6 kg)  09/30/21 208 lb 9.6 oz (94.6 kg)     Health Maintenance Due  Topic Date Due   COVID-19 Vaccine (6 - Booster for Pfizer series) 10/13/2021    There are no preventive care reminders to display for this patient.  Lab Results   Component Value Date   TSH 0.44 02/19/2021   Lab Results  Component Value Date   WBC 4.3 02/10/2021   HGB 14.0 02/10/2021   HCT 40.4 02/10/2021   MCV 101.0 (H) 02/10/2021   PLT 203 02/10/2021   Lab Results  Component Value Date   NA 138 02/10/2021   K 4.2 02/10/2021   CO2 26 02/10/2021   GLUCOSE 102 (H) 02/10/2021   BUN 17 02/10/2021   CREATININE 1.12 02/10/2021   BILITOT 1.0 03/25/2021   ALKPHOS 79 03/25/2021   AST 28 03/25/2021   ALT 33 03/25/2021   PROT 7.0 03/25/2021   ALBUMIN 4.6 03/25/2021   CALCIUM 8.9 02/10/2021   ANIONGAP 7 02/10/2021   GFR 69.70 11/10/2020   Lab Results  Component Value Date   CHOL 163 03/25/2021   Lab Results  Component Value Date   HDL 59 03/25/2021   Lab Results  Component Value Date   LDLCALC 86 03/25/2021   Lab Results  Component Value Date   TRIG 101 03/25/2021   Lab Results  Component Value Date   CHOLHDL 2.8 03/25/2021   Lab Results  Component Value Date   HGBA1C 5.4 09/30/2021      Assessment & Plan:   Problem List Items Addressed This Visit   None Visit Diagnoses     Nausea    -  Primary   Relevant Orders   CMP     Patient presents with 2-week  history of relatively constant nausea without vomiting.  Symptoms actually improved somewhat past couple days.  Etiology unclear.  He was taking more ibuprofen than usual and plans to leave that off.  -Check comprehensive metabolic panel to rule out any electrolyte disturbance or hepatic dysfunction. -Avoid further use of ibuprofen -Be in touch with [place] for any persistent or worsening symptoms or any new symptoms  No orders of the defined types were placed in this encounter.   Follow-up: No follow-ups on file.    [name], MD

## 2021-11-23 NOTE — Telephone Encounter (Signed)
Filled by a historical provider. Ok to fill?

## 2021-11-24 LAB — COMPREHENSIVE METABOLIC PANEL
ALT: 32 U/L (ref 0–53)
AST: 28 U/L (ref 0–37)
Albumin: 4.3 g/dL (ref 3.5–5.2)
Alkaline Phosphatase: 64 U/L (ref 39–117)
BUN: 20 mg/dL (ref 6–23)
CO2: 30 mEq/L (ref 19–32)
Calcium: 9.8 mg/dL (ref 8.4–10.5)
Chloride: 102 mEq/L (ref 96–112)
Creatinine, Ser: 1.16 mg/dL (ref 0.40–1.50)
GFR: 62.8 mL/min (ref >60.00)
Glucose, Bld: 79 mg/dL (ref 70–99)
Potassium: 4.8 mEq/L (ref 3.5–5.1)
Sodium: 138 mEq/L (ref 135–145)
Total Bilirubin: 0.7 mg/dL (ref 0.2–1.2)
Total Protein: 7.1 g/dL (ref 6.0–8.3)

## 2021-11-24 NOTE — Telephone Encounter (Signed)
Please advise. Rx is not on the current med list 

## 2021-11-30 ENCOUNTER — Other Ambulatory Visit: Payer: Self-pay | Admitting: Family Medicine

## 2021-12-02 ENCOUNTER — Telehealth: Payer: Medicare Other | Admitting: Family

## 2021-12-02 DIAGNOSIS — K649 Unspecified hemorrhoids: Secondary | ICD-10-CM

## 2021-12-02 NOTE — Progress Notes (Signed)
Based on what you shared with me, I feel your condition warrants further evaluation and I recommend that you be seen in a face to face visit.  It is possible this is a hemorrhoid, however, I think it would be best to be seen in person to be evaluated to rule out something more serious.    NOTE: There will be NO CHARGE for this eVisit   If you are having a true medical emergency please call 911.      For an urgent face to face visit, [facility] has six urgent care centers for your convenience:     [facility] [place] [facility] [place] Get Driving Directions [phone] [place], [place] [address]    [facility]) Get Driving Directions [phone] [place], [place] [address]  [facility] [place] [facility]) Get Driving Directions [phone] [address] [place],  [place]  [address]  [facility] Get Driving Directions [phone] [place], [place], [place] [address]   [facility] Get Driving Directions  [phone] [address] [place], [place] [address]   [facility] [place] Get Driving Directions [phone] [address], [place], [place] [address]  Your MyChart E-visit questionnaire answers were reviewed by a board certified advanced clinical practitioner to complete your personal care plan based on your specific symptoms.  Thank you for using e-Visits.

## 2021-12-09 ENCOUNTER — Encounter: Payer: Self-pay | Admitting: Family Medicine

## 2021-12-15 ENCOUNTER — Ambulatory Visit (INDEPENDENT_AMBULATORY_CARE_PROVIDER_SITE_OTHER): Payer: Medicare Other | Admitting: Family Medicine

## 2021-12-15 VITALS — BP 120/64 | HR 72 | Temp 98.0°F | Wt 209.6 lb

## 2021-12-15 DIAGNOSIS — E039 Hypothyroidism, unspecified: Secondary | ICD-10-CM | POA: Diagnosis not present

## 2021-12-15 DIAGNOSIS — R5383 Other fatigue: Secondary | ICD-10-CM

## 2021-12-15 LAB — T4, FREE: Free T4: 1.25 ng/dL (ref 0.60–1.60)

## 2021-12-15 LAB — CBC WITH DIFFERENTIAL/PLATELET
Basophils Absolute: 0 10*3/uL (ref 0.0–0.1)
Basophils Relative: 0.9 % (ref 0.0–3.0)
Eosinophils Absolute: 0.2 10*3/uL (ref 0.0–0.7)
Eosinophils Relative: 4.3 % (ref 0.0–5.0)
HCT: 45.4 % (ref 39.0–52.0)
Hemoglobin: 15 g/dL (ref 13.0–17.0)
Lymphocytes Relative: 33.4 % (ref 12.0–46.0)
Lymphs Abs: 1.3 10*3/uL (ref 0.7–4.0)
MCHC: 33.1 g/dL (ref 30.0–36.0)
MCV: 103.6 fl — ABNORMAL HIGH (ref 78.0–100.0)
Monocytes Absolute: 0.3 10*3/uL (ref 0.1–1.0)
Monocytes Relative: 7.3 % (ref 3.0–12.0)
Neutro Abs: 2.1 10*3/uL (ref 1.4–7.7)
Neutrophils Relative %: 54.1 % (ref 43.0–77.0)
Platelets: 199 10*3/uL (ref 150.0–400.0)
RBC: 4.39 Mil/uL (ref 4.22–5.81)
RDW: 12.6 % (ref 11.5–15.5)
WBC: 3.9 10*3/uL — ABNORMAL LOW (ref 4.0–10.5)

## 2021-12-15 LAB — TSH: TSH: 0.29 u[IU]/mL — ABNORMAL LOW (ref 0.35–5.50)

## 2021-12-15 NOTE — Progress Notes (Signed)
Established Patient Office Visit  Subjective:  Patient ID: Matthew Pratt, male    DOB: 07/18/1951  Age: 71 y.o. MRN: [id number]  CC:  Chief Complaint  Patient presents with   Fatigue    X 4 days, thinks he had a virus, was very cold, nausea, tickle in the throat, feeling better now    HPI Baker Moronta Vannatter presents for recent symptoms over a week ago which he presumed may be related to viral infection.  He developed some mild sore throat symptoms along with intermittent right ear pain and increased fatigue.  No cough.  No urinary symptoms.  No documented fever.  He had some transient nausea without vomiting and also transient left upper quadrant pain.  He had a few episodes of loose stool.  Symptoms lasted about 4 days.  He was then feeling better but this past Saturday developed some acute chills but again no documented fever.  He had some increased malaise since then.  He wonders if his thyroid may be off but knows this would not likely cause acute chills.  He has hypothyroidism treated with Synthroid.  He does suspect that he may have occasional nausea related to his medication.  He does take this on empty stomach.  Compliant with therapy.  Past Medical History:  Diagnosis Date   Anxiety    Arthritis    BPH (benign prostatic hypertrophy)    CAD (coronary artery disease)    s/p STEMI 10/15/10 w/ Promus DES placed x1 in the first OM   Colon polyps    Complication of anesthesia    pt. reported 2015 penile inplant when getting some anesthesia had chest pain,they readjusted  the meds. and continued with surgery. Had no further problems di not have to follow up with anyone.   Depression    Depression with anxiety    Dupuytren's contracture of hand    ED (erectile dysfunction)    GERD (gastroesophageal reflux disease)    Glaucoma    HLD (hyperlipidemia)    Hypocontractile bladder    Hypothyroidism    MI (myocardial infarction) ([place])    denies   OSA on CPAP    Peyronie's  disease    Rosacea    Skin cancer    BCC    Past Surgical History:  Procedure Laterality Date   CHOLECYSTECTOMY N/A 02/10/2021   Procedure: LAPAROSCOPIC CHOLECYSTECTOMY;  Surgeon: Coralie Keens, MD;  Location: [facility];  Service: General;  Laterality: N/A;   COLONOSCOPY WITH ESOPHAGOGASTRODUODENOSCOPY (EGD)     multiple   CORONARY STENT PLACEMENT     INNER EAR SURGERY     LAPAROSCOPIC CHOLECYSTECTOMY Bilateral 02/09/2021   Dr.blackmon   LASIK  02/1998   PENILE PROSTHESIS IMPLANT     TONSILECTOMY, ADENOIDECTOMY, BILATERAL MYRINGOTOMY AND TUBES     TONSILLECTOMY     VASECTOMY     WISDOM TOOTH EXTRACTION      Family History  Problem Relation Age of Onset   COPD Mother    Cancer Father        gallbladder cancer   Varicose Veins Father    Liver cancer Father    Basal cell carcinoma Father    Basal cell carcinoma Sister    Melanoma Sister    Rheum arthritis Maternal Grandmother    Heart failure Maternal Grandfather    Colon cancer Paternal Grandmother    Arthritis Paternal Grandfather    Colon cancer Paternal Grandfather    COPD Other     Social  History   Socioeconomic History   Marital status: Divorced    Spouse name: Not on file   Number of children: 2   Years of education: Not on file   Highest education level: Bachelor's degree (e.g., BA, AB, BS)  Occupational History   Occupation: International aid/development worker    Comment: retired  Tobacco Use   Smoking status: Never   Smokeless tobacco: Never  Vaping Use   Vaping Use: Never used  Substance and Sexual Activity   Alcohol use: No   Drug use: No   Sexual activity: Not on file  Other Topics Concern   Not on file  Social History Narrative   Divorced, 2 children   Retired Chief Financial Officer   No EtOH, tobacco, drugs   Social Determinants of Radio broadcast assistant Strain: Low Risk    Difficulty of Paying Living Expenses: Not very hard  Food Insecurity: Landscape architect Present   Worried About Ship broker in the Last Year: Never true   Arboriculturist in the Last Year: Sometimes true  Transportation Needs: No Transportation Needs   Lack of Transportation (Medical): No   Lack of Transportation (Non-Medical): No  Physical Activity: Inactive   Days of Exercise per Week: 0 days   Minutes of Exercise per Session: 30 min  Stress: Stress Concern Present   Feeling of Stress : To some extent  Social Connections: Moderately Integrated   Frequency of Communication with Friends and Family: Once a week   Frequency of Social Gatherings with Friends and Family: Twice a week   Attends Religious Services: More than 4 times per year   Active Member of [employer] or Organizations: Yes   Attends Music therapist: More than 4 times per year   Marital Status: Divorced  Human resources officer Violence: Not At Risk   Fear of Current or Ex-Partner: No   Emotionally Abused: No   Physically Abused: No   Sexually Abused: No    Outpatient Medications Prior to Visit  Medication Sig Dispense Refill   aspirin EC 81 MG tablet Take 81 mg by mouth every morning.     b complex vitamins capsule Take 1 capsule by mouth daily at 6 PM.     calcium carbonate (OS-CAL) 600 MG TABS tablet Take 600 mg by mouth daily.     Cholecalciferol (VITAMIN D3) 125 MCG (5000 UT) CAPS Take 5,000 Units by mouth daily at 6 PM.     latanoprost (XALATAN) 0.005 % ophthalmic solution Place 1 drop into both eyes at bedtime.     Omega-3 Fatty Acids (FISH OIL) 1000 MG CAPS Take 1 capsule by mouth daily.     omeprazole (PRILOSEC OTC) 20 MG tablet Take 20 mg by mouth daily at 6 PM.     SYNTHROID 137 MCG tablet TAKE 1 TABLET BY MOUTH EVERY DAY 90 tablet 1   timolol (TIMOPTIC) 0.5 % ophthalmic solution Place 1 drop into both eyes 2 (two) times daily.     traZODone (DESYREL) 50 MG tablet TAKE 1/2 TO 1 TABLET BY MOUTH AT NIGHT AS NEEDED FOR INSOMNIA 90 tablet 0   TURMERIC PO Take 1,500 mg by mouth daily.     venlafaxine XR (EFFEXOR-XR) 37.5  MG 24 hr capsule TAKE 3 CAPSULES BY MOUTH EVERY DAY 270 capsule 3   zinc gluconate 50 MG tablet Take 50 mg by mouth daily at 6 PM.     No facility-administered medications prior to visit.  Allergies  Allergen Reactions   Bee Venom Hives and Swelling   Crestor [Rosuvastatin] Other (See Comments)    Myalgia.     Iodinated Contrast Media Hives and Itching   Sulfa Antibiotics Rash   Sulfonamide Derivatives Rash    ROS Review of Systems  Constitutional:  Positive for fatigue.  Respiratory:  Negative for cough and shortness of breath.   Cardiovascular:  Negative for chest pain.  Gastrointestinal:  Negative for blood in stool.  Genitourinary:  Negative for dysuria.  Neurological:  Negative for headaches.  Psychiatric/Behavioral:  Negative for confusion.      Objective:    Physical Exam Vitals reviewed.  Constitutional:      Appearance: Normal appearance.  HENT:     Right Ear: Tympanic membrane normal.     Left Ear: Tympanic membrane normal.     Mouth/Throat:     Mouth: Mucous membranes are moist.     Pharynx: Oropharynx is clear.  Cardiovascular:     Rate and Rhythm: Normal rate and regular rhythm.  Pulmonary:     Effort: Pulmonary effort is normal.     Breath sounds: Normal breath sounds.  Abdominal:     General: There is no distension.     Palpations: Abdomen is soft.     Tenderness: There is no abdominal tenderness.  Neurological:     Mental Status: He is alert.    BP 120/64 (BP Location: Left Arm, Patient Position: Sitting, Cuff Size: Normal)    Pulse 72    Temp 98 F (36.7 C) (Oral)    Wt 209 lb 9.6 oz (95.1 kg)    SpO2 98%    BMI 27.65 kg/m  Wt Readings from Last 3 Encounters:  12/15/21 209 lb 9.6 oz (95.1 kg)  11/23/21 209 lb 4.8 oz (94.9 kg)  11/02/21 208 lb 8 oz (94.6 kg)     Health Maintenance Due  Topic Date Due   COVID-19 Vaccine (6 - Booster for Pfizer series) 10/13/2021    There are no preventive care reminders to display for this  patient.  Lab Results  Component Value Date   TSH 0.44 02/19/2021   Lab Results  Component Value Date   WBC 4.3 02/10/2021   HGB 14.0 02/10/2021   HCT 40.4 02/10/2021   MCV 101.0 (H) 02/10/2021   PLT 203 02/10/2021   Lab Results  Component Value Date   NA 138 11/23/2021   K 4.8 11/23/2021   CO2 30 11/23/2021   GLUCOSE 79 11/23/2021   BUN 20 11/23/2021   CREATININE 1.16 11/23/2021   BILITOT 0.7 11/23/2021   ALKPHOS 64 11/23/2021   AST 28 11/23/2021   ALT 32 11/23/2021   PROT 7.1 11/23/2021   ALBUMIN 4.3 11/23/2021   CALCIUM 9.8 11/23/2021   ANIONGAP 7 02/10/2021   GFR 62.80 11/23/2021   Lab Results  Component Value Date   CHOL 163 03/25/2021   Lab Results  Component Value Date   HDL 59 03/25/2021   Lab Results  Component Value Date   LDLCALC 86 03/25/2021   Lab Results  Component Value Date   TRIG 101 03/25/2021   Lab Results  Component Value Date   CHOLHDL 2.8 03/25/2021   Lab Results  Component Value Date   HGBA1C 5.4 09/30/2021      Assessment & Plan:   Problem List Items Addressed This Visit       Unprioritized   Fatigue   Relevant Orders   CBC with Differential/Platelet  Hypothyroid - Primary   Relevant Orders   TSH   T4, Free  Patient relates nonspecific symptoms of fatigue following possible viral illness over a week ago.  No documented fever.  He is concerned whether his thyroid may be off.  He has been compliant with therapy.  Nonfocal exam at this time.  Nontoxic in appearance.  -Check TSH, free T4, CBC  No orders of the defined types were placed in this encounter.   Follow-up: No follow-ups on file.    [name], MD

## 2021-12-16 ENCOUNTER — Other Ambulatory Visit: Payer: Self-pay

## 2021-12-16 MED ORDER — LEVOTHYROXINE SODIUM 125 MCG PO TABS: 125.0000 ug | ORAL_TABLET | Freq: Every day | ORAL | 3 refills | Status: DC

## 2021-12-27 ENCOUNTER — Other Ambulatory Visit: Payer: Self-pay | Admitting: Family Medicine

## 2021-12-28 ENCOUNTER — Ambulatory Visit (INDEPENDENT_AMBULATORY_CARE_PROVIDER_SITE_OTHER): Payer: Medicare Other | Admitting: Family Medicine

## 2021-12-28 VITALS — BP 122/70 | HR 62 | Temp 98.3°F | Wt 208.9 lb

## 2021-12-28 DIAGNOSIS — F339 Major depressive disorder, recurrent, unspecified: Secondary | ICD-10-CM

## 2021-12-28 DIAGNOSIS — L821 Other seborrheic keratosis: Secondary | ICD-10-CM

## 2021-12-28 DIAGNOSIS — L570 Actinic keratosis: Secondary | ICD-10-CM | POA: Diagnosis not present

## 2021-12-28 DIAGNOSIS — E039 Hypothyroidism, unspecified: Secondary | ICD-10-CM | POA: Diagnosis not present

## 2021-12-28 MED ORDER — FLUOROURACIL 5 % EX CREA: TOPICAL_CREAM | Freq: Every day | CUTANEOUS | 0 refills | Status: DC

## 2021-12-28 NOTE — Patient Instructions (Signed)
Remember to set up 2 month follow up with labs.

## 2021-12-28 NOTE — Progress Notes (Addendum)
Established Patient Office Visit  Subjective:  Patient ID: Matthew Pratt, male    DOB: [birth year]  Age: 71 y.o. MRN: [id number]  CC: No chief complaint on file.   HPI Matthew Pratt presents for the following issues  Longstanding history of recurrent depression.  He actually has been diagnosed in the past with "dysthymia".  He has seen a counselor for years but is had recent issues with insurance coverage.  He feels though his depression is currently stable on regimen of Effexor 37.5 mg 3 tablets daily.  He is not interested in pursuing other counseling at this point but has not ruled out in the future if necessary.  Hypothyroidism.  Recent labs indicated slightly over replaced.  No palpitations.  No recent significant weight loss.  We reduced his Synthroid to 125 mcg daily.  He has history of keratoses on his face left and right cheek.  He would like to have these treated if possible.  He also has history of actinic keratoses on his forehead.  Years ago he saw a dermatologist and used Efudex 5% cream which worked well.  He tolerated this well that time.  He would like to consider treatment with that again.  Past Medical History:  Diagnosis Date   Anxiety    Arthritis    BPH (benign prostatic hypertrophy)    CAD (coronary artery disease)    s/p STEMI 10/15/10 w/ Promus DES placed x1 in the first OM   Colon polyps    Complication of anesthesia    pt. reported 2015 penile inplant when getting some anesthesia had chest pain,they readjusted  the meds. and continued with surgery. Had no further problems di not have to follow up with anyone.   Depression    Depression with anxiety    Dupuytren's contracture of hand    ED (erectile dysfunction)    GERD (gastroesophageal reflux disease)    Glaucoma    HLD (hyperlipidemia)    Hypocontractile bladder    Hypothyroidism    MI (myocardial infarction) ([facility])    denies   OSA on CPAP    Peyronie's disease    Rosacea    Skin cancer     BCC    Past Surgical History:  Procedure Laterality Date   CHOLECYSTECTOMY N/A 02/10/2021   Procedure: LAPAROSCOPIC CHOLECYSTECTOMY;  Surgeon: Coralie Keens, MD;  Location: [facility];  Service: General;  Laterality: N/A;   COLONOSCOPY WITH ESOPHAGOGASTRODUODENOSCOPY (EGD)     multiple   CORONARY STENT PLACEMENT     INNER EAR SURGERY     LAPAROSCOPIC CHOLECYSTECTOMY Bilateral 02/09/2021   Dr.blackmon   LASIK  02/1998   PENILE PROSTHESIS IMPLANT     TONSILECTOMY, ADENOIDECTOMY, BILATERAL MYRINGOTOMY AND TUBES     TONSILLECTOMY     VASECTOMY     WISDOM TOOTH EXTRACTION      Family History  Problem Relation Age of Onset   COPD Mother    Cancer Father        gallbladder cancer   Varicose Veins Father    Liver cancer Father    Basal cell carcinoma Father    Basal cell carcinoma Sister    Melanoma Sister    Rheum arthritis Maternal Grandmother    Heart failure Maternal Grandfather    Colon cancer Paternal Grandmother    Arthritis Paternal Grandfather    Colon cancer Paternal Grandfather    COPD Other     Social History   Socioeconomic History   Marital status: Divorced  Spouse name: Not on file   Number of children: 2   Years of education: Not on file   Highest education level: Bachelor's degree (e.g., BA, AB, BS)  Occupational History   Occupation: International aid/development worker    Comment: retired  Tobacco Use   Smoking status: Never   Smokeless tobacco: Never  Vaping Use   Vaping Use: Never used  Substance and Sexual Activity   Alcohol use: No   Drug use: No   Sexual activity: Not on file  Other Topics Concern   Not on file  Social History Narrative   Divorced, 2 children   Retired Chief Financial Officer   No EtOH, tobacco, drugs   Social Determinants of Radio broadcast assistant Strain: Low Risk    Difficulty of Paying Living Expenses: Not very hard  Food Insecurity: Landscape architect Present   Worried About Charity fundraiser in the Last Year: Never true    Arboriculturist in the Last Year: Sometimes true  Transportation Needs: No Transportation Needs   Lack of Transportation (Medical): No   Lack of Transportation (Non-Medical): No  Physical Activity: Inactive   Days of Exercise per Week: 0 days   Minutes of Exercise per Session: 30 min  Stress: Stress Concern Present   Feeling of Stress : To some extent  Social Connections: Moderately Integrated   Frequency of Communication with Friends and Family: Once a week   Frequency of Social Gatherings with Friends and Family: Twice a week   Attends Religious Services: More than 4 times per year   Active Member of [employer] or Organizations: Yes   Attends Music therapist: More than 4 times per year   Marital Status: Divorced  Human resources officer Violence: Not At Risk   Fear of Current or Ex-Partner: No   Emotionally Abused: No   Physically Abused: No   Sexually Abused: No    Outpatient Medications Prior to Visit  Medication Sig Dispense Refill   aspirin EC 81 MG tablet Take 81 mg by mouth every morning.     b complex vitamins capsule Take 1 capsule by mouth daily at 6 PM.     calcium carbonate (OS-CAL) 600 MG TABS tablet Take 600 mg by mouth daily.     Cholecalciferol (VITAMIN D3) 125 MCG (5000 UT) CAPS Take 5,000 Units by mouth daily at 6 PM.     latanoprost (XALATAN) 0.005 % ophthalmic solution Place 1 drop into both eyes at bedtime.     levothyroxine (SYNTHROID) 125 MCG tablet Take 1 tablet (125 mcg total) by mouth daily. 90 tablet 3   Omega-3 Fatty Acids (FISH OIL) 1000 MG CAPS Take 1 capsule by mouth daily.     omeprazole (PRILOSEC OTC) 20 MG tablet Take 20 mg by mouth daily at 6 PM.     timolol (TIMOPTIC) 0.5 % ophthalmic solution Place 1 drop into both eyes 2 (two) times daily.     traZODone (DESYREL) 50 MG tablet TAKE 1/2 TO 1 TABLET BY MOUTH AT NIGHT AS NEEDED FOR INSOMNIA 90 tablet 0   TURMERIC PO Take 1,500 mg by mouth daily.     venlafaxine XR (EFFEXOR-XR) 37.5 MG 24 hr  capsule TAKE 3 CAPSULES BY MOUTH EVERY DAY 270 capsule 3   zinc gluconate 50 MG tablet Take 50 mg by mouth daily at 6 PM.     No facility-administered medications prior to visit.    Allergies  Allergen Reactions   Bee Venom  Hives and Swelling   Crestor [Rosuvastatin] Other (See Comments)    Myalgia.     Iodinated Contrast Media Hives and Itching   Sulfa Antibiotics Rash   Sulfonamide Derivatives Rash    ROS Review of Systems  Constitutional:  Negative for chills and fever.  Cardiovascular:  Negative for chest pain.  Psychiatric/Behavioral:  Negative for agitation, confusion and dysphoric mood. The patient is not nervous/anxious.      Objective:    Physical Exam Vitals reviewed.  Constitutional:      Appearance: Normal appearance.  Cardiovascular:     Rate and Rhythm: Normal rate and regular rhythm.  Pulmonary:     Effort: Pulmonary effort is normal.     Breath sounds: Normal breath sounds.  Skin:    Comments: On his forehead he has some scattered slightly hyperkeratotic skin lesions.  No ulcerations.  No nodular changes.  Right cheek he has well demarcated minimally raised brownish scaly lesion consistent with seborrheic keratosis.  No atypical features.  Neurological:     Mental Status: He is alert.    BP 122/70 (BP Location: Left Arm, Patient Position: Sitting, Cuff Size: Normal)    Pulse 62    Temp 98.3 F (36.8 C) (Oral)    Wt 208 lb 14.4 oz (94.8 kg)    SpO2 98%    BMI 27.56 kg/m  Wt Readings from Last 3 Encounters:  12/28/21 208 lb 14.4 oz (94.8 kg)  12/15/21 209 lb 9.6 oz (95.1 kg)  11/23/21 209 lb 4.8 oz (94.9 kg)     Health Maintenance Due  Topic Date Due   COVID-19 Vaccine (6 - Booster for Pfizer series) 10/13/2021    There are no preventive care reminders to display for this patient.  Lab Results  Component Value Date   TSH 0.29 (L) 12/15/2021   Lab Results  Component Value Date   WBC 3.9 (L) 12/15/2021   HGB 15.0 12/15/2021   HCT 45.4  12/15/2021   MCV 103.6 (H) 12/15/2021   PLT 199.0 12/15/2021   Lab Results  Component Value Date   NA 138 11/23/2021   K 4.8 11/23/2021   CO2 30 11/23/2021   GLUCOSE 79 11/23/2021   BUN 20 11/23/2021   CREATININE 1.16 11/23/2021   BILITOT 0.7 11/23/2021   ALKPHOS 64 11/23/2021   AST 28 11/23/2021   ALT 32 11/23/2021   PROT 7.1 11/23/2021   ALBUMIN 4.3 11/23/2021   CALCIUM 9.8 11/23/2021   ANIONGAP 7 02/10/2021   GFR 62.80 11/23/2021   Lab Results  Component Value Date   CHOL 163 03/25/2021   Lab Results  Component Value Date   HDL 59 03/25/2021   Lab Results  Component Value Date   LDLCALC 86 03/25/2021   Lab Results  Component Value Date   TRIG 101 03/25/2021   Lab Results  Component Value Date   CHOLHDL 2.8 03/25/2021   Lab Results  Component Value Date   HGBA1C 5.4 09/30/2021      Assessment & Plan:   #1 history of recurrent depression.  He has used combination therapy in the past with medication and counseling.  He recently had issues with counseling coverage as above.  At this point he does not wish to pursue further counseling.  Continue with Effexor XR 37.5 mg 3 capsules daily.  Reassess in 2 months and sooner as needed  #2 hypothyroidism.  Recently over replaced.  We recently adjusted his medication.  Recommend follow-up in 2 months to recheck  TSH Discussed risk of over replacement  #3 benign-appearing seborrheic keratoses right cheek.  Patient requesting treatment.  He has noted some recent itching and irritation.   We explained these are not concerning in terms of cancer risk.  We discussed risk of treatment liquid nitrogen including risk of pain, blistering, low risk of scarring and infection.  Patient consented.  We treated a total of 3 separate lesions that were clustered together right cheek area and patient tolerated well.  We recommended he touch base in a few weeks if these are not improving.  #4 actinic keratoses involving the forehead.   Patient has multiple small scattered areas.  No concerning areas for skin cancer.  He has done well with Efudex 5% cream in the past and we wrote prescription for this to use once daily in small quantity.  He knows this can be caustic and blistering   Meds ordered this encounter  Medications   fluorouracil (EFUDEX) 5 % cream    Sig: Apply topically daily.    Dispense:  40 g    Refill:  0    Follow-up: Return in about 2 months (around 02/25/2022).    [name], MD

## 2022-01-13 ENCOUNTER — Encounter: Payer: Self-pay | Admitting: Family Medicine

## 2022-01-15 ENCOUNTER — Other Ambulatory Visit: Payer: Self-pay

## 2022-01-15 DIAGNOSIS — M720 Palmar fascial fibromatosis [Dupuytren]: Secondary | ICD-10-CM

## 2022-01-20 ENCOUNTER — Other Ambulatory Visit: Payer: Self-pay | Admitting: Family Medicine

## 2022-01-23 ENCOUNTER — Encounter: Payer: Self-pay | Admitting: Family Medicine

## 2022-01-24 ENCOUNTER — Other Ambulatory Visit: Payer: Self-pay | Admitting: Family Medicine

## 2022-01-29 ENCOUNTER — Telehealth: Payer: Self-pay | Admitting: Family Medicine

## 2022-01-29 NOTE — Telephone Encounter (Signed)
Pt has been sch to see dr Amedeo Plenty on 03-25-2022 at 945 am at [facility] location. Their office fax over the referral information. Pt is aware of appt

## 2022-02-08 ENCOUNTER — Telehealth: Payer: Self-pay | Admitting: Internal Medicine

## 2022-02-08 NOTE — Telephone Encounter (Signed)
This message was received Via Mychart to the scheduling pool:  ? ? ? ?Good morning Shana, ?My CP is intermittent and right now is gone. My chest does feel heavy but I have no cough and I don't feel sick. My O2 sat is 95 and my pulse 59. I do not have shortness of breath at this time. I have not taken nitro. This has been going on for several weeks off and on. When it first started the pain was more intense and I took additional aspirin [2 '81mg'$ ]. When it came back a second time this weekend, I knew I should come in. I've been having diarrhea for about the same period. The feeling of the pain was a tight feeling in center chest, the same as in 2011 when they put a stent in.  ? ? ? ?Good Morning [name]! ?Can you tell me a little more about your chest , I will need to forward this info to one of our triage nurses.   ?1. Are you having CP right now?  ? ?2. Are you experiencing any other symptoms (ex. SOB, nausea, vomiting, sweating)?  ? ?3. How long have you been experiencing CP?  ? ?4. Is your CP continuous or coming and going?  ? ?5. Have you taken Nitroglycerin?  ??  ? ? ? ?Appointment Request From: [facility] [place] ? ?With Provider: Elouise Munroe, MD [place] ? ?Preferred Date Range: 02/15/2022 - 02/26/2022 ? ?Preferred Times: Any Time ? ?Reason for visit: Office Visit ? ?Comments: ?I am having intermittent discomfort in my center chest.  ?

## 2022-02-08 NOTE — Telephone Encounter (Signed)
Returned call to pt he states that he has been SOB w/chest pain. He states that his BP/HR have been normal in the 120's. He states that he cannot relate the CP to anything as it is intermittent. He states that "this feels like it did before the last surgery.  This happens a couple of times daily but it goes away.  I have scheduled an appt at our [place] location next week 3-14. He will arrive early.  ?Informed pt that he should go to the ER for eval when having chest pain. He states that he does not feel he needs to go at this point but will go if he feels needed.  ?

## 2022-02-11 NOTE — Progress Notes (Unsigned)
Cardiology Office Note:    Date:  02/14/2022   ID:  Matthew Pratt, DOB 30-Sep-1951, MRN [id number]  PCP:  [name], MD   [facility] Providers Cardiologist:  Elouise Munroe, MD { Click to update primary MD,subspecialty MD or APP then REFRESH:1}    Referring MD: [name], MD   Follow-up for coronary artery disease and hyperlipidemia  History of Present Illness:    Matthew Pratt is a 71 y.o. male with a hx of STEMI 11/11 with PCI and DES to first OM, BPH, hyperlipidemia, OSA on CPAP and anxiety.  He was seen by Dr.Acharya on 12/30/2020.  During that time he was being evaluated for elective cholecystectomy by Dr. Ninfa Linden.  He was able to complete greater than 4 METS of physical activity and denied DOE.  His symptoms for STEMI were reviewed.  He noted traditional pressing chest discomfort during the episode.  He denied recurrent chest pain.  He reported feeling well since his previous cardiology visit.  He reported using his recumbent bike regularly and denied cardiac symptoms.  Here reported compliance with his CPAP device.  He also mentioned a episode of syncope that he had 35 years ago while he was at work.  He remembered/described tunnel vision.  He presents to the clinic today for follow-up evaluation states***  *** denies chest pain, shortness of breath, lower extremity edema, fatigue, palpitations, melena, hematuria, hemoptysis, diaphoresis, weakness, presyncope, syncope, orthopnea, and PND.   Past Medical History:  Diagnosis Date   Anxiety    Arthritis    BPH (benign prostatic hypertrophy)    CAD (coronary artery disease)    s/p STEMI 10/15/10 w/ Promus DES placed x1 in the first OM   Colon polyps    Complication of anesthesia    pt. reported 2015 penile inplant when getting some anesthesia had chest pain,they readjusted  the meds. and continued with surgery. Had no further problems di not have to follow up with anyone.   Depression     Depression with anxiety    Dupuytren's contracture of hand    ED (erectile dysfunction)    GERD (gastroesophageal reflux disease)    Glaucoma    HLD (hyperlipidemia)    Hypocontractile bladder    Hypothyroidism    MI (myocardial infarction) ([place])    denies   OSA on CPAP    Peyronie's disease    Rosacea    Skin cancer    BCC    Past Surgical History:  Procedure Laterality Date   CHOLECYSTECTOMY N/A 02/10/2021   Procedure: LAPAROSCOPIC CHOLECYSTECTOMY;  Surgeon: Coralie Keens, MD;  Location: [place];  Service: General;  Laterality: N/A;   COLONOSCOPY WITH ESOPHAGOGASTRODUODENOSCOPY (EGD)     multiple   CORONARY STENT PLACEMENT     INNER EAR SURGERY     LAPAROSCOPIC CHOLECYSTECTOMY Bilateral 02/09/2021   Dr.blackmon   LASIK  02/1998   PENILE PROSTHESIS IMPLANT     TONSILECTOMY, ADENOIDECTOMY, BILATERAL MYRINGOTOMY AND TUBES     TONSILLECTOMY     VASECTOMY     WISDOM TOOTH EXTRACTION      Current Medications: No outpatient medications have been marked as taking for the 02/16/22 encounter (Appointment) with Deberah Pelton, NP.     Allergies:   Bee venom, Crestor [rosuvastatin], Iodinated contrast media, Sulfa antibiotics, and Sulfonamide derivatives   Social History   Socioeconomic History   Marital status: Divorced    Spouse name: Not on file   Number of children:  2   Years of education: Not on file   Highest education level: Bachelor's degree (e.g., BA, AB, BS)  Occupational History   Occupation: International aid/development worker    Comment: retired  Tobacco Use   Smoking status: Never   Smokeless tobacco: Never  Vaping Use   Vaping Use: Never used  Substance and Sexual Activity   Alcohol use: No   Drug use: No   Sexual activity: Not on file  Other Topics Concern   Not on file  Social History Narrative   Divorced, 2 children   Retired Chief Financial Officer   No EtOH, tobacco, drugs   Social Determinants of Radio broadcast assistant Strain: Low Risk     Difficulty of Paying Living Expenses: Not very hard  Food Insecurity: Landscape architect Present   Worried About Charity fundraiser in the Last Year: Never true   Arboriculturist in the Last Year: Sometimes true  Transportation Needs: No Transportation Needs   Lack of Transportation (Medical): No   Lack of Transportation (Non-Medical): No  Physical Activity: Inactive   Days of Exercise per Week: 0 days   Minutes of Exercise per Session: 30 min  Stress: Stress Concern Present   Feeling of Stress : To some extent  Social Connections: Moderately Integrated   Frequency of Communication with Friends and Family: Once a week   Frequency of Social Gatherings with Friends and Family: Twice a week   Attends Religious Services: More than 4 times per year   Active Member of [employer] or Organizations: Yes   Attends Music therapist: More than 4 times per year   Marital Status: Divorced     Family History: The patient's ***family history includes Arthritis in his paternal grandfather; Basal cell carcinoma in his father and sister; COPD in his mother and another family member; Cancer in his father; Colon cancer in his paternal grandfather and paternal grandmother; Heart failure in his maternal grandfather; Liver cancer in his father; Melanoma in his sister; Rheum arthritis in his maternal grandmother; Varicose Veins in his father.  ROS:   Please see the history of present illness.    *** All other systems reviewed and are negative.   Risk Assessment/Calculations:   {Does this patient have ATRIAL FIBRILLATION?:[phone]}       Physical Exam:    VS:  There were no vitals taken for this visit.    Wt Readings from Last 3 Encounters:  12/28/21 208 lb 14.4 oz (94.8 kg)  12/15/21 209 lb 9.6 oz (95.1 kg)  11/23/21 209 lb 4.8 oz (94.9 kg)     GEN: *** Well nourished, well developed in no acute distress HEENT: Normal NECK: No JVD; No carotid bruits LYMPHATICS: No  lymphadenopathy CARDIAC: ***RRR, no murmurs, rubs, gallops RESPIRATORY:  Clear to auscultation without rales, wheezing or rhonchi  ABDOMEN: Soft, non-tender, non-distended MUSCULOSKELETAL:  No edema; No deformity  SKIN: Warm and dry NEUROLOGIC:  Alert and oriented x 3 PSYCHIATRIC:  Normal affect    EKGs/Labs/Other Studies Reviewed:    The following studies were reviewed today:  Echocardiogram 01/20/2021 IMPRESSIONS     1. Left ventricular ejection fraction, by estimation, is 55 to 60%. The  left ventricle has normal function. The left ventricle has no regional  wall motion abnormalities. There is mild left ventricular hypertrophy.  Left ventricular diastolic parameters  are indeterminate.   2. Right ventricular systolic function is normal. The right ventricular  size is normal.  3. The mitral valve is normal in structure. Trivial mitral valve  regurgitation. No evidence of mitral stenosis.   4. The aortic valve is normal in structure. Aortic valve regurgitation is  mild. No aortic stenosis is present.  EKG:  EKG is *** ordered today.  The ekg ordered today demonstrates ***  Recent Labs: 11/23/2021: ALT 32; BUN 20; Creatinine, Ser 1.16; Potassium 4.8; Sodium 138 12/15/2021: Hemoglobin 15.0; Platelets 199.0; TSH 0.29  Recent Lipid Panel    Component Value Date/Time   CHOL 163 03/25/2021 0954   TRIG 101 03/25/2021 0954   HDL 59 03/25/2021 0954   CHOLHDL 2.8 03/25/2021 0954   CHOLHDL 4.7 06/30/2020 0305   VLDL 18 06/30/2020 0305   LDLCALC 86 03/25/2021 0954    ASSESSMENT & PLAN    Coronary artery disease-no recent episodes of arm neck back or chest discomfort.  Underwent cardiac catheterization in the setting of STEMI 11/11 then received PCI with DES to his OM1. Continue omega-3 fatty acids, aspirin, Heart healthy low-sodium diet-salty 6 given Increase physical activity as tolerated  Hyperlipidemia-03/25/2021: Cholesterol, Total 163; HDL 59; LDL Chol Calc (NIH) 86;  Triglycerides 101 Statin intolerant, noted myalgias with statin therapy.  Previously offered referral to pharmacy for evaluation of PCSK9 inhibitor therapy.  (Praluent, Repatha, may be candidate for inclisiran Continue omega-3 fatty acids, turmeric Heart healthy low-sodium high-fiber diet Increase physical activity as tolerated  Sinus bradycardia-EKG today shows*** Continue to monitor Heart healthy low-sodium diet-salty 6 given Increase physical activity as tolerated  Obstructive sleep apnea-reports compliance with CPAP.  Waking up well rested. Continue CPAP use  Disposition: Follow-up with Dr. Margaretann Loveless or me in 9-12 months.  {Are you ordering a CV Procedure (e.g. stress test, cath, DCCV, TEE, etc)?   Press F2        :[id number]}    Medication Adjustments/Labs and Tests Ordered: Current medicines are reviewed at length with the patient today.  Concerns regarding medicines are outlined above.  No orders of the defined types were placed in this encounter.  No orders of the defined types were placed in this encounter.   There are no Patient Instructions on file for this visit.   Signed, Deberah Pelton, NP  02/14/2022 3:23 PM      Notice: This dictation was prepared with Dragon dictation along with smaller phrase technology. Any transcriptional errors that result from this process are unintentional and may not be corrected upon review.  I spent***minutes examining this patient, reviewing medications, and using patient centered shared decision making involving her cardiac care.  Prior to her visit I spent greater than 20 minutes reviewing her past medical history,  medications, and prior cardiac tests.

## 2022-02-16 ENCOUNTER — Encounter (HOSPITAL_BASED_OUTPATIENT_CLINIC_OR_DEPARTMENT_OTHER): Payer: Self-pay | Admitting: General Practice

## 2022-02-16 ENCOUNTER — Other Ambulatory Visit: Payer: Self-pay

## 2022-02-16 ENCOUNTER — Ambulatory Visit (HOSPITAL_BASED_OUTPATIENT_CLINIC_OR_DEPARTMENT_OTHER): Payer: Medicare Other | Admitting: General Practice

## 2022-02-16 VITALS — BP 120/68 | HR 50 | Ht 73.0 in | Wt 218.1 lb

## 2022-02-16 DIAGNOSIS — E785 Hyperlipidemia, unspecified: Secondary | ICD-10-CM | POA: Diagnosis not present

## 2022-02-16 DIAGNOSIS — I2510 Atherosclerotic heart disease of native coronary artery without angina pectoris: Secondary | ICD-10-CM

## 2022-02-16 DIAGNOSIS — G4733 Obstructive sleep apnea (adult) (pediatric): Secondary | ICD-10-CM | POA: Diagnosis not present

## 2022-02-16 DIAGNOSIS — G720 Drug-induced myopathy: Secondary | ICD-10-CM | POA: Diagnosis not present

## 2022-02-16 DIAGNOSIS — T466X5D Adverse effect of antihyperlipidemic and antiarteriosclerotic drugs, subsequent encounter: Secondary | ICD-10-CM

## 2022-02-16 DIAGNOSIS — I495 Sick sinus syndrome: Secondary | ICD-10-CM | POA: Diagnosis not present

## 2022-02-16 NOTE — Patient Instructions (Signed)
Medication Instructions:  ?Your Physician recommend you continue on your current medication as directed.   ? ?*If you need a refill on your cardiac medications before your next appointment, please call your pharmacy* ? ? ?Lab Work: ?Your physician recommends that you return for lab work today- BMET, Engineer, materials (3rd floor)  ? ?If you have labs (blood work) drawn today and your tests are completely normal, you will receive your results only by: ?MyChart Message (if you have MyChart) OR ?A paper copy in the mail ?If you have any lab test that is abnormal or we need to change your treatment, we will call you to review the results. ? ?Follow-Up: ?At [facility], you and your health needs are our priority.  As part of our continuing mission to provide you with exceptional heart care, we have created designated Provider Care Teams.  These Care Teams include your primary Cardiologist (physician) and Advanced Practice Providers (APPs -  Physician Assistants and Nurse Practitioners) who all work together to provide you with the care you need, when you need it. ? ?We recommend signing up for the patient portal called "MyChart".  Sign up information is provided on this After Visit Summary.  MyChart is used to connect with patients for Virtual Visits (Telemedicine).  Patients are able to view lab/test results, encounter notes, upcoming appointments, etc.  Non-urgent messages can be sent to your provider as well.   ?To learn more about what you can do with MyChart, go to [email or web address].   ? ?Your next appointment:   ?9 month(s) ? ?The format for your next appointment:   ?In Person ? ?Provider:   ?Elouise Munroe, MD { ? ?Other Instructions ?Exercise recommendations: ?The American Heart Association recommends 150 minutes of moderate intensity exercise weekly. ?Try 30 minutes of moderate intensity exercise 4-5 times per week. ?This could include walking, jogging, or swimming. ? ? ? ? ?

## 2022-02-17 LAB — BASIC METABOLIC PANEL
BUN/Creatinine Ratio: 15 (ref 10–24)
BUN: 17 mg/dL (ref 8–27)
CO2: 28 mmol/L (ref 20–29)
Calcium: 9.6 mg/dL (ref 8.6–10.2)
Chloride: 100 mmol/L (ref 96–106)
Creatinine, Ser: 1.15 mg/dL (ref 0.76–1.27)
Glucose: 91 mg/dL (ref 70–99)
Potassium: 5.2 mmol/L (ref 3.5–5.2)
Sodium: 139 mmol/L (ref 134–144)
eGFR: 67 mL/min/{1.73_m2} (ref >59)

## 2022-02-17 LAB — MAGNESIUM: Magnesium: 2 mg/dL (ref 1.6–2.3)

## 2022-02-19 ENCOUNTER — Telehealth: Payer: Self-pay | Admitting: Pharmacist

## 2022-02-19 NOTE — Chronic Care Management (AMB) (Signed)
? ? ?  Chronic Care Management ?Pharmacy Assistant  ? ?Name: Matthew Pratt  MRN: [id number] DOB: 1951-06-30 ? ?02/22/2022 APPOINTMENT REMINDER ? ?Matthew Pratt was reminded to have all medications, supplements and any blood glucose and blood pressure readings available for review with Jeni Salles, Pharm. D, at his office visit on 02/22/2022 at 10:30. ? ? ?Questions: ?Have you had any recent office visit or specialist visit outside of [facility]? Patient denies ? ?Are there any concerns you would like to discuss during your office visit? Patient would like to discuss the times of when he is taking is medications and he would like to discuss why he is getting multiple sources wanting to "take control" of his medications such as [facility], [employer]. ? ?Are you having any problems obtaining your medications? (Whether it pharmacy issues or cost) Patient denies any issues getting his medications ? ?If patient has any PAP medications ask if they are having any problems getting their PAP medication or refill? Patient denies any medications coming from pap ? ?Care Gaps: ?AWV - message sent to Ramond Craver ?Last BP - 120/68 on 02/16/2022 ?Covid booster - overdue ?  ?Star Rating Drugs:  ?None ? ? ?Any gaps in medications fill history? No ? ?Gennie Alma CMA  ?Clinical Pharmacist Assistant ?[phone] ? ?

## 2022-02-22 ENCOUNTER — Other Ambulatory Visit: Payer: Self-pay

## 2022-02-22 ENCOUNTER — Other Ambulatory Visit (INDEPENDENT_AMBULATORY_CARE_PROVIDER_SITE_OTHER): Payer: Medicare Other

## 2022-02-22 ENCOUNTER — Ambulatory Visit (INDEPENDENT_AMBULATORY_CARE_PROVIDER_SITE_OTHER): Payer: Medicare Other | Admitting: Pharmacist

## 2022-02-22 DIAGNOSIS — E039 Hypothyroidism, unspecified: Secondary | ICD-10-CM | POA: Diagnosis not present

## 2022-02-22 DIAGNOSIS — F339 Major depressive disorder, recurrent, unspecified: Secondary | ICD-10-CM

## 2022-02-22 LAB — TSH: TSH: 3.82 u[IU]/mL (ref 0.35–5.50)

## 2022-02-22 NOTE — Progress Notes (Signed)
? ?Chronic Care Management ?Pharmacy Note ? ?02/22/2022 ?Name:  Matthew Pratt MRN:  [id number] DOB:  [birth year] ? ?Summary: ?Pt is having trouble with the schedule of his medications ?TSH not within goal range ?  ?Recommendations/Changes made from today's visit: ?-Recommended moving venlafaxine to noon time to avoid worsening of insomnia ?-Scheduled lab visit for repeat TSH ?-Requested nitroglycerin refill from cardiology ?  ?Plan: ?Follow up in 6 months ? ? ?Subjective: ?Matthew Pratt is an 71 y.o. year old male who is a primary patient of Burchette, Alinda Sierras, MD.  The CCM team was consulted for assistance with disease management and care coordination needs.   ? ?Engaged with patient face to face for follow up visit in response to provider referral for pharmacy case management and/or care coordination services.  ? ?Consent to Services:  ?The patient was given information about Chronic Care Management services, agreed to services, and gave verbal consent prior to initiation of services.  Please see initial visit note for detailed documentation.  ? ?Patient Care Team: ?[name], MD as PCP - General (Family Medicine) ?Elouise Munroe, MD as PCP - Cardiology (Cardiology) ?Viona Gilmore, [facility] as Pharmacist (Pharmacist) ? ?Recent office visits: ?12/28/21 [name], MD: Patient presented for hypothyroidism and depression follow up. Prescribed fluorouracil cream. ? ?12/15/21 [name], MD: Patient presented for hypothyroidism follow up and fatigue. ? ?11/23/21 [name], MD: Patient presented for nausea. ? ?11/02/21 [name], MD: Patient presented for chronic conditions follow up. ? ?09/30/2021 [name] MD(PCP) - Patient was seen for bee sting allergy and additional issues. Discontinued Mirtazapine. No follow up noted.  ?  ?09/08/2021 [name] MD(PCP) - Patient was seen for insomnia. Started on Trazodone 50 mg 1/2 to 1 tablet at bedtime prn. No follow up noted.   ? ?Recent consult visits: ?02/16/22 [name], NP (cardiology): Patient presented for CAD follow up. No medication changes. ? ?12/02/21 Evelina Dun, FNP (telehealth): Patient presented for possible hemorrhoids. Recommended in person evaluation. ? ?01/07/21 Elder Cyphers (cardiology): Patient presented to the lipid clinic for initiation of Repatha. ? ?12/30/20 Cherlynn Kaiser, MD (cardiology): Patient presented to establish care and get surgical clearance. Referred to lipid clinic. ? ?Hospital visits: ?07/13/21 Patient presented to [facility] emergency department for eye trauma. ? ?Objective: ? ?Lab Results  ?Component Value Date  ? CREATININE 1.15 02/16/2022  ? BUN 17 02/16/2022  ? GFR 62.80 11/23/2021  ? GFRNONAA >60 02/10/2021  ? GFRAA >60 07/01/2020  ? NA 139 02/16/2022  ? K 5.2 02/16/2022  ? CALCIUM 9.6 02/16/2022  ? CO2 28 02/16/2022  ? ? ?Lab Results  ?Component Value Date/Time  ? HGBA1C 5.4 09/30/2021 12:39 PM  ? HGBA1C 5.4 04/29/2021 11:36 AM  ? HGBA1C 5.6 06/29/2020 11:55 AM  ? HGBA1C (H) 10/16/2010 05:35 AM  ?  5.7 ?(NOTE)                                                                       According to the ADA Clinical Practice Recommendations for 2011, when HbA1c is used as a screening test:   >=6.5%   Diagnostic of Diabetes Mellitus           (if abnormal result ?  is confirmed)  5.7-6.4%   Increased risk of developing Diabetes Mellitus  References:Diagnosis and Classification of Diabetes Mellitus,Diabetes KAJG,8115,72(IOMBT 1):S62-S69 and Standards of Medical Care in         Diabetes - 2011,Diabetes DHRC,1638,45  ?(Suppl 1):S11-S61.  ? GFR 62.80 11/23/2021 03:01 PM  ? GFR 69.70 11/10/2020 12:11 PM  ?  ?Last diabetic Eye exam: No results found for: HMDIABEYEEXA  ?Last diabetic Foot exam: No results found for: HMDIABFOOTEX  ? ?Lab Results  ?Component Value Date  ? CHOL 163 03/25/2021  ? HDL 59 03/25/2021  ? [place] 86 03/25/2021  ? TRIG 101 03/25/2021  ? CHOLHDL 2.8 03/25/2021   ? ? ?Hepatic Function Latest Ref Rng & Units 11/23/2021 03/25/2021 03/02/2021  ?Total Protein 6.0 - 8.3 g/dL 7.1 7.0 6.8  ?Albumin 3.5 - 5.2 g/dL 4.3 4.6 4.4  ?AST 0 - 37 U/L $Remo'28 28 21  'Mdmni$ ?ALT 0 - 53 U/L 32 33 24  ?Alk Phosphatase 39 - 117 U/L 64 79 79  ?Total Bilirubin 0.2 - 1.2 mg/dL 0.7 1.0 0.7  ?Bilirubin, Direct 0.00 - 0.40 mg/dL - 0.26 0.21  ? ? ?Lab Results  ?Component Value Date/Time  ? TSH 0.29 (L) 12/15/2021 10:08 AM  ? TSH 0.44 02/19/2021 09:24 AM  ? FREET4 1.25 12/15/2021 10:08 AM  ? ? ?CBC Latest Ref Rng & Units 12/15/2021 02/10/2021 11/10/2020  ?WBC 4.0 - 10.5 K/uL 3.9(L) 4.3 4.4  ?Hemoglobin 13.0 - 17.0 g/dL 15.0 14.0 14.6  ?Hematocrit 39.0 - 52.0 % 45.4 40.4 42.8  ?Platelets 150.0 - 400.0 K/uL 199.0 203 189.0  ? ? ?Lab Results  ?Component Value Date/Time  ? VD25OH 33.61 02/19/2021 09:24 AM  ? VD25OH 47.44 03/29/2019 12:43 PM  ? ? ? ?Clinical ASCVD: Yes  ?The ASCVD Risk score (Arnett DK, et al., 2019) failed to calculate for the following reasons: ?  The patient has a prior MI or stroke diagnosis   ? ?Depression screen [facility] 2/9 12/15/2021 11/02/2021 07/01/2021  ?Decreased Interest 1 0 0  ?Down, Depressed, Hopeless 1 0 1  ?PHQ - 2 Score 2 0 1  ?Altered sleeping 2 - -  ?Tired, decreased energy 2 - -  ?Change in appetite 0 - -  ?Feeling bad or failure about yourself  1 - -  ?Trouble concentrating 0 - -  ?Moving slowly or fidgety/restless 0 - -  ?Suicidal thoughts 0 - -  ?PHQ-9 Score 7 - -  ?Difficult doing work/chores - - -  ?  ? ? ?Social History  ? ?Tobacco Use  ?Smoking Status Never  ?Smokeless Tobacco Never  ? ?BP Readings from Last 3 Encounters:  ?02/16/22 120/68  ?12/28/21 122/70  ?12/15/21 120/64  ? ?Pulse Readings from Last 3 Encounters:  ?02/16/22 (!) 50  ?12/28/21 62  ?12/15/21 72  ? ?Wt Readings from Last 3 Encounters:  ?02/16/22 218 lb 1.6 oz (98.9 kg)  ?12/28/21 208 lb 14.4 oz (94.8 kg)  ?12/15/21 209 lb 9.6 oz (95.1 kg)  ? ? ?Assessment/Interventions: Review of patient past medical history, allergies,  medications, health status, including review of consultants reports, laboratory and other test data, was performed as part of comprehensive evaluation and provision of chronic care management services.  ? ?SDOH:  (Social Determinants of Health) assessments and interventions performed: No ? ? ?CCM Care Plan ? ?Allergies  ?Allergen Reactions  ? Bee Venom Hives and Swelling  ? Crestor [Rosuvastatin] Other (See Comments)  ?  Myalgia. ? ?  ? Iodinated Contrast Media Hives and Itching  ?  Sulfa Antibiotics Rash  ? Sulfonamide Derivatives Rash  ? ? ?Medications Reviewed Today   ? ? Reviewed by Viona Gilmore, Martin (Pharmacist) on 02/22/22 at 93  Med List Status: <None>  ? ?Medication Order Taking? Sig Documenting Provider Last Dose Status Informant  ?aspirin EC 81 MG tablet [id number] Yes Take 81 mg by mouth every morning. [provider] Taking Active Self  ?         ?Med Note Tamala Julian, JEFFREY W   Fri Jun 27, 2020 11:32 PM)    ?b complex vitamins capsule [id number] Yes Take 1 capsule by mouth daily at 6 PM. [provider] Taking Active Self  ?calcium carbonate (OS-CAL) 600 MG TABS tablet [id number] Yes Take 600 mg by mouth daily. [provider] Taking Active   ?Cholecalciferol (VITAMIN D3) 125 MCG (5000 UT) CAPS [id number] Yes Take 5,000 Units by mouth daily at 6 PM. [provider] Taking Active Self  ?EPINEPHrine 0.3 mg/0.3 mL IJ SOAJ injection [id number]  Inject into the muscle as needed. [provider]  Active   ?latanoprost (XALATAN) 0.005 % ophthalmic solution [id number]  Place 1 drop into both eyes at bedtime. [provider]  Active Self  ?levothyroxine (SYNTHROID) 125 MCG tablet [id number] Yes Take 1 tablet (125 mcg total) by mouth daily. [name], MD Taking Active   ?omeprazole (PRILOSEC OTC) 20 MG tablet [id number] Yes Take 20 mg by mouth daily at 6 PM. [provider] Taking Active Self  ?timolol (TIMOPTIC) 0.5 % ophthalmic solution [id number]  Yes Place 1 drop into both eyes 2 (two) times daily. [provider] Taking Active Self  ?traZODone (DESYREL) 50 MG tablet [id number]  TAKE 1/2 TO 1 TABLET BY MOUTH AT NIGHT AS NEEDED FOR INSOMNIA Bu

## 2022-02-22 NOTE — Patient Instructions (Addendum)
Here is the schedule of your medications: ?Early morning: levothyroxine (Synthroid), aspirin ?Noon: venlafaxine, vitamins and supplements ?Bedtime: trazodone, omeprazole ? ? ?[name] ?Jeni Salles, PharmD, BCACP ?Clinical Pharmacist ?Therapist, music at [place] ?[phone] ? ?Visit Information ? ? Goals Addressed   ?None ?  ? ?Patient Care Plan: [place]  ?  ? ?Problem Identified: Problem: Hyperlipidemia, Coronary Artery Disease, GERD, Hypothyroidism, Depression, Osteopenia and glaucoma   ?  ? ?Long-Range Goal: Patient-Specific Goal   ?Start Date: 02/19/2021  ?Expected End Date: 02/19/2022  ?Recent Progress: On track  ?Priority: High  ?Note:   ?Current Barriers:  ?Unable to independently monitor therapeutic efficacy ?Unable to achieve control of cholesterol  ? ?Pharmacist Clinical Goal(s):  ?Patient will verbalize ability to afford treatment regimen ?achieve adherence to monitoring guidelines and medication adherence to achieve therapeutic efficacy ?achieve control of cholesterol as evidenced by next cholesterol check  through collaboration with PharmD and provider.  ? ?Interventions: ?1:1 collaboration with [name], MD regarding development and update of comprehensive plan of care as evidenced by provider attestation and co-signature ?Inter-disciplinary care team collaboration (see longitudinal plan of care) ?Comprehensive medication review performed; medication list updated in electronic medical record ? ?Hyperlipidemia: (LDL goal < 70) ?-Uncontrolled ?-Current treatment: ?No medications ?-Medications previously tried: statins (LFT elevation), Repatha (side effects)  ?-Current dietary patterns: patient has cut out a lot of fat ?-Current exercise habits: no structured exercise ?-Educated on Cholesterol goals;  ?Importance of limiting foods high in cholesterol; ?Exercise goal of 150 minutes per week; ?-Recommended to continue current medication ?Consider trial of  Praluent. ? ?CAD/History of MI (Goal: prevent future heart attacks and strokes) ?-Controlled ?-Current treatment  ?Aspirin 81 mg 1 tablet daily - Appropriate, Effective, Safe, Accessible ?-Medications previously tried: none  ?-Counseled on monitoring for signs of bleeding such as unexplained and excessive bleeding from a cut or injury, easy or excessive bruising, blood in urine or stools, and nosebleeds without a known cause ?Recommended nitroglycerin refill ? ?Hypothyroidism (Goal: TSH 0.35-4.5) ?-Uncontrolled ?-Current treatment  ?Synthroid 125 mcg 1 tablet daily - Appropriate, Query effective, Safe, Accessible ?-Medications previously tried: none  ?-Recommended to continue current medication ? ?GERD/Barrett's esophagus (Goal: minimize symptoms) ?-Controlled ?-Current treatment  ?Omeprazole 20 mg 1 capsule daily - Appropriate, Effective, Safe, Accessible ?-Medications previously tried: none  ?-Recommended to continue current medication ?Recommended administering in the evening on an empty stomach. ? ?Rosacea (Goal: minimize symptoms) ?-Controlled ?-Current treatment  ?No medications ?-Medications previously tried: doxycycline, metronidazole ?-Recommended to continue current medication ? ?Depression (Goal: minimize symptoms) ?-Controlled ?-Current treatment: ?Venlafaxine XR 37.5 mg 4 capsules daily - Appropriate, Effective, Safe, Accessible ?-Medications previously tried/failed: SSRIs (bruising) ?-PHQ9: 0 ?-Educated on Benefits of medication for symptom control ?Benefits of cognitive-behavioral therapy with or without medication ?-Recommended to continue current medication ?-Recommended follow up with behavioral health ? ? ?Osteopenia (Goal prevent fractures) ?-Controlled ?-Last DEXA Scan: 04/16/20  ?            T-Score femoral neck: RFN -1.2, LFN -1.3 ?            T-Score total hip: n/a ?            T-Score lumbar spine: -1.3 ?            T-Score forearm radius: n/a ?            10-year probability of major  osteoporotic fracture: 9.9% ?            10-year probability of  hip fracture: 3.1% ?-Patient is not a candidate for pharmacologic treatment ?-Current treatment  ?Vitamin D 5000 units 1 tablet daily - Appropriate, Effective, Safe, Accessible ?Calcium 600 mg 1 tablet daily - Appropriate, Effective, Safe, Accessible ?-Medications previously tried: none ?-Recommend [phone] units of vitamin D daily. Recommend 1200 mg of calcium daily from dietary and supplemental sources. Recommend weight-bearing and muscle strengthening exercises for building and maintaining bone density. ?-Recommended to continue current medication ? ?Glaucoma (Goal: lower intraocular pressure) ?-Controlled ?-Current treatment  ?Latanoprost 0.005% 1 drop in both eyes at bedtime - Appropriate, Effective, Safe, Accessible ?Timolol 0.5% 1 drop in both eyes twice daily - Appropriate, Effective, Safe, Accessible ?-Medications previously tried: none  ?-Recommended to continue current medication ? ?Health Maintenance ?-Vaccine gaps: none ?-Current therapy:  ?Vitamin B complex daily ?Turmeric 3100-9000 mg daily ?Zinc 50 mg 1 tablet daily ?-Educated on Cost vs benefit of each product must be carefully weighed by individual consumer ?-Patient is satisfied with current therapy and denies issues ?-Recommended to continue current medication ? ?Patient Goals/Self-Care Activities ?Patient will:  ?- take medications as prescribed ?target a minimum of 150 minutes of moderate intensity exercise weekly ? ?Follow Up Plan: Face to Face appointment with care management team member scheduled for:  6 months ?  ?  ? ?Patient verbalizes understanding of instructions and care plan provided today and agrees to view in [place]. Active MyChart status confirmed with patient.   ?Telephone follow up appointment with pharmacy team member scheduled for: 6 months ? ?Viona Gilmore, RPH  ?

## 2022-02-23 ENCOUNTER — Other Ambulatory Visit: Payer: Self-pay

## 2022-02-23 MED ORDER — NITROGLYCERIN 0.4 MG SL SUBL: 0.4000 mg | SUBLINGUAL_TABLET | SUBLINGUAL | 1 refills | Status: DC | PRN

## 2022-03-05 DIAGNOSIS — E785 Hyperlipidemia, unspecified: Secondary | ICD-10-CM | POA: Diagnosis not present

## 2022-03-05 DIAGNOSIS — H409 Unspecified glaucoma: Secondary | ICD-10-CM

## 2022-03-05 DIAGNOSIS — F32A Depression, unspecified: Secondary | ICD-10-CM

## 2022-03-05 DIAGNOSIS — E039 Hypothyroidism, unspecified: Secondary | ICD-10-CM | POA: Diagnosis not present

## 2022-03-05 DIAGNOSIS — I2510 Atherosclerotic heart disease of native coronary artery without angina pectoris: Secondary | ICD-10-CM | POA: Diagnosis not present

## 2022-03-05 DIAGNOSIS — M8580 Other specified disorders of bone density and structure, unspecified site: Secondary | ICD-10-CM | POA: Diagnosis not present

## 2022-03-15 ENCOUNTER — Encounter: Payer: Self-pay | Admitting: Family Medicine

## 2022-03-25 ENCOUNTER — Encounter: Payer: Self-pay | Admitting: Family Medicine

## 2022-03-25 DIAGNOSIS — M79642 Pain in left hand: Secondary | ICD-10-CM | POA: Diagnosis not present

## 2022-03-25 DIAGNOSIS — M13849 Other specified arthritis, unspecified hand: Secondary | ICD-10-CM | POA: Diagnosis not present

## 2022-03-25 DIAGNOSIS — S63002A Unspecified subluxation of left wrist and hand, initial encounter: Secondary | ICD-10-CM | POA: Diagnosis not present

## 2022-03-26 NOTE — Telephone Encounter (Signed)
Noted  

## 2022-03-30 ENCOUNTER — Encounter: Payer: Self-pay | Admitting: Family Medicine

## 2022-03-30 ENCOUNTER — Ambulatory Visit (INDEPENDENT_AMBULATORY_CARE_PROVIDER_SITE_OTHER): Payer: Medicare Other | Admitting: Family Medicine

## 2022-03-30 VITALS — BP 110/70 | HR 50 | Temp 97.4°F | Ht 73.0 in | Wt 217.9 lb

## 2022-03-30 DIAGNOSIS — E039 Hypothyroidism, unspecified: Secondary | ICD-10-CM | POA: Diagnosis not present

## 2022-03-30 DIAGNOSIS — R5383 Other fatigue: Secondary | ICD-10-CM

## 2022-03-30 MED ORDER — LIOTHYRONINE SODIUM 25 MCG PO TABS: ORAL_TABLET | ORAL | 2 refills | Status: DC

## 2022-03-30 NOTE — Progress Notes (Signed)
? ?Established Patient Office Visit ? ?Subjective   ?Patient ID: Matthew Pratt, male    DOB: 08-Nov-1951  Age: 71 y.o. MRN: [id number] ? ?Chief Complaint  ?Patient presents with  ? Thyroid Problem  ? ? ?HPI ? ? ?Seen with increased fatigue and ongoing depression symptoms.  He states he is usually sleeping 10 hours at night which is usually very restful sleep but also taking sometimes 1-3 naps per day.  Still has incredible fatigue.  He has hypothyroidism and is on replacement with levothyroxine 125 mcg daily.  Last TSH was normal.  We did discuss possible low-dose T3 replacement to see if this makes a difference.  Patient also wonders about things like testosterone.  He has essentially no libido.  He does have longstanding history of recurrent depression and remains on venlafaxine.  Currently not exercising.  No recent chest pain.  No dyspnea. ? ?Recent electrolytes and CBC have been normal. ? ?Past Medical History:  ?Diagnosis Date  ? Anxiety   ? Arthritis   ? BPH (benign prostatic hypertrophy)   ? CAD (coronary artery disease)   ? s/p STEMI 10/15/10 w/ Promus DES placed x1 in the first OM  ? Colon polyps   ? Complication of anesthesia   ? pt. reported 2015 penile inplant when getting some anesthesia had chest pain,they readjusted  the meds. and continued with surgery. Had no further problems di not have to follow up with anyone.  ? Depression   ? Depression with anxiety   ? Dupuytren's contracture of hand   ? ED (erectile dysfunction)   ? GERD (gastroesophageal reflux disease)   ? Glaucoma   ? HLD (hyperlipidemia)   ? Hypocontractile bladder   ? Hypothyroidism   ? MI (myocardial infarction) ([place])   ? denies  ? OSA on CPAP   ? Peyronie's disease   ? Rosacea   ? Skin cancer   ? BCC  ? ?Past Surgical History:  ?Procedure Laterality Date  ? CHOLECYSTECTOMY N/A 02/10/2021  ? Procedure: LAPAROSCOPIC CHOLECYSTECTOMY;  Surgeon: Coralie Keens, MD;  Location: [facility];  Service: General;  Laterality: N/A;  ? COLONOSCOPY  WITH ESOPHAGOGASTRODUODENOSCOPY (EGD)    ? multiple  ? CORONARY STENT PLACEMENT    ? INNER EAR SURGERY    ? LAPAROSCOPIC CHOLECYSTECTOMY Bilateral 02/09/2021  ? Dr.blackmon  ? LASIK  02/1998  ? PENILE PROSTHESIS IMPLANT    ? TONSILECTOMY, ADENOIDECTOMY, BILATERAL MYRINGOTOMY AND TUBES    ? TONSILLECTOMY    ? VASECTOMY    ? WISDOM TOOTH EXTRACTION    ? ? reports that he has never smoked. He has never used smokeless tobacco. He reports that he does not drink alcohol and does not use drugs. ?family history includes Arthritis in his paternal grandfather; Basal cell carcinoma in his father and sister; COPD in his mother and another family member; Cancer in his father; Colon cancer in his paternal grandfather and paternal grandmother; Heart failure in his maternal grandfather; Liver cancer in his father; Melanoma in his sister; Rheum arthritis in his maternal grandmother; Varicose Veins in his father. ?Allergies  ?Allergen Reactions  ? Bee Venom Hives and Swelling  ? Crestor [Rosuvastatin] Other (See Comments)  ?  Myalgia. ? ?  ? Iodinated Contrast Media Hives and Itching  ? Sulfa Antibiotics Rash  ? Sulfonamide Derivatives Rash  ? ? ?Review of Systems  ?Constitutional:  Negative for chills, fever and weight loss.  ?Respiratory:  Negative for cough and shortness of breath.   ?Cardiovascular:  Negative for chest pain, orthopnea and leg swelling.  ?Gastrointestinal:  Negative for nausea and vomiting.  ?Psychiatric/Behavioral:  The patient does not have insomnia.   ? ?  ?Objective:  ?  ? ?BP 110/70 (BP Location: Left Arm, Patient Position: Sitting, Cuff Size: Normal)   Pulse (!) 50   Temp (!) 97.4 ?F (36.3 ?C) (Oral)   Ht $R'6\' 1"'yn$  (1.854 m)   Wt 217 lb 14.4 oz (98.8 kg)   SpO2 98%   BMI 28.75 kg/m?  ?Wt Readings from Last 3 Encounters:  ?03/30/22 217 lb 14.4 oz (98.8 kg)  ?02/16/22 218 lb 1.6 oz (98.9 kg)  ?12/28/21 208 lb 14.4 oz (94.8 kg)  ? ?  ? ?Physical Exam ?Constitutional:   ?   Appearance: He is well-developed.   ?HENT:  ?   Right Ear: External ear normal.  ?   Left Ear: External ear normal.  ?Eyes:  ?   Pupils: Pupils are equal, round, and reactive to light.  ?Neck:  ?   Thyroid: No thyromegaly.  ?Cardiovascular:  ?   Rate and Rhythm: Normal rate and regular rhythm.  ?Pulmonary:  ?   Effort: Pulmonary effort is normal. No respiratory distress.  ?   Breath sounds: Normal breath sounds. No wheezing or rales.  ?Musculoskeletal:  ?   Cervical back: Neck supple.  ?   Right lower leg: No edema.  ?   Left lower leg: No edema.  ?Neurological:  ?   Mental Status: He is alert and oriented to person, place, and time.  ?Psychiatric:     ?   Mood and Affect: Mood normal.     ?   Thought Content: Thought content normal.  ? ? ? ?No results found for any visits on 03/30/22. ? ?Last CBC ?Lab Results  ?Component Value Date  ? WBC 3.9 (L) 12/15/2021  ? HGB 15.0 12/15/2021  ? HCT 45.4 12/15/2021  ? MCV 103.6 (H) 12/15/2021  ? MCH 35.0 (H) 02/10/2021  ? RDW 12.6 12/15/2021  ? PLT 199.0 12/15/2021  ? ?Last metabolic panel ?Lab Results  ?Component Value Date  ? GLUCOSE 91 02/16/2022  ? NA 139 02/16/2022  ? K 5.2 02/16/2022  ? CL 100 02/16/2022  ? CO2 28 02/16/2022  ? BUN 17 02/16/2022  ? CREATININE 1.15 02/16/2022  ? EGFR 67 02/16/2022  ? CALCIUM 9.6 02/16/2022  ? PROT 7.1 11/23/2021  ? ALBUMIN 4.3 11/23/2021  ? BILITOT 0.7 11/23/2021  ? ALKPHOS 64 11/23/2021  ? AST 28 11/23/2021  ? ALT 32 11/23/2021  ? ANIONGAP 7 02/10/2021  ? ?Last lipids ?Lab Results  ?Component Value Date  ? CHOL 163 03/25/2021  ? HDL 59 03/25/2021  ? [place] 86 03/25/2021  ? TRIG 101 03/25/2021  ? CHOLHDL 2.8 03/25/2021  ? ?  ? ?The ASCVD Risk score (Arnett DK, et al., 2019) failed to calculate for the following reasons: ?  The patient has a prior MI or stroke diagnosis ? ?  ?Assessment & Plan:  ? ?#1 hypothyroidism.  He is on replacement and recent TSH normal.  Progressive fatigue as above.  We discussed possible trial of low-dose T3 with Cytomel 25 mcg 1/2 tablet daily.   We will plan to reassess in a couple months and recheck TSH along with free T3 and T4 that time ? ?#2 fatigue.  Possibly related #1.  He appears to be getting more than adequate sleep.  He does have extremely low libido.  He has questions about testosterone. ?-  Return tomorrow morning for early morning testosterone screen ? ? ?No follow-ups on file.  ? ? ?[name], MD ? ?

## 2022-03-31 ENCOUNTER — Other Ambulatory Visit (INDEPENDENT_AMBULATORY_CARE_PROVIDER_SITE_OTHER): Payer: Medicare Other

## 2022-03-31 DIAGNOSIS — R5383 Other fatigue: Secondary | ICD-10-CM

## 2022-03-31 LAB — TESTOSTERONE: Testosterone: 345.64 ng/dL (ref 300.00–890.00)

## 2022-04-01 DIAGNOSIS — H401131 Primary open-angle glaucoma, bilateral, mild stage: Secondary | ICD-10-CM | POA: Diagnosis not present

## 2022-04-05 ENCOUNTER — Other Ambulatory Visit: Payer: Self-pay

## 2022-04-05 ENCOUNTER — Encounter: Payer: Self-pay | Admitting: Family Medicine

## 2022-04-05 DIAGNOSIS — E039 Hypothyroidism, unspecified: Secondary | ICD-10-CM

## 2022-04-11 ENCOUNTER — Encounter: Payer: Self-pay | Admitting: Family Medicine

## 2022-04-19 ENCOUNTER — Ambulatory Visit (INDEPENDENT_AMBULATORY_CARE_PROVIDER_SITE_OTHER): Payer: Medicare Other

## 2022-04-19 VITALS — BP 120/60 | HR 50 | Temp 98.7°F | Ht 73.0 in | Wt 219.2 lb

## 2022-04-19 DIAGNOSIS — Z0000 Encounter for general adult medical examination without abnormal findings: Secondary | ICD-10-CM | POA: Diagnosis not present

## 2022-04-19 NOTE — Progress Notes (Signed)
? ?Subjective:  ? Alan Drummer is a 71 y.o. male who presents for Medicare Annual/Subsequent preventive examination. ? ?Review of Systems    ? ?Cardiac Risk Factors include: advanced age (>27mn, >>45women);male gender ? ?   ?Objective:  ?  ?Today's Vitals  ? 04/19/22 1339  ?BP: 120/60  ?Pulse: (!) 50  ?Temp: 98.7 ?F (37.1 ?C)  ?TempSrc: Oral  ?SpO2: 96%  ?Weight: 219 lb 3.2 oz (99.4 kg)  ?Height: '6\' 1"'$  (1.854 m)  ? ?Body mass index is 28.92 kg/m?. ? ? ?  04/19/2022  ?  2:01 PM 04/17/2021  ? 10:07 AM 02/10/2021  ?  6:29 AM 06/29/2020  ?  4:00 PM 02/17/2017  ?  8:02 PM 01/17/2017  ?  8:21 PM  ?Advanced Directives  ?Does Patient Have a Medical Advance Directive? Yes Yes Yes No No Yes  ?Type of [place] will   Living will  ?Does patient want to make changes to medical advance directive? No - Patient declined  No - Patient declined     ?[place]? No - copy requested No - copy requested      ?Would patient like information on creating a medical advance directive?    Yes (Inpatient - patient defers creating a medical advance directive at this time - Information given) No - Patient declined   ? ? ?Current Medications (verified) ?Outpatient Encounter Medications as of 04/19/2022  ?Medication Sig  ? aspirin EC 81 MG tablet Take 81 mg by mouth every morning.  ? b complex vitamins capsule Take 1 capsule by mouth daily at 6 PM.  ? calcium carbonate (OS-CAL) 600 MG TABS tablet Take 600 mg by mouth daily.  ? Cholecalciferol (VITAMIN D3) 125 MCG (5000 UT) CAPS Take 5,000 Units by mouth daily at 6 PM.  ? EPINEPHrine 0.3 mg/0.3 mL IJ SOAJ injection Inject into the muscle as needed.  ? latanoprost (XALATAN) 0.005 % ophthalmic solution Place 1 drop into both eyes at bedtime.  ? levothyroxine (SYNTHROID) 125 MCG tablet Take 1 tablet (125 mcg total) by mouth daily.  ? liothyronine (CYTOMEL) 25 MCG tablet  Take one half tablet by mouth once daily.  ? nitroGLYCERIN (NITROSTAT) 0.4 MG SL tablet Place 1 tablet (0.4 mg total) under the tongue every 5 (five) minutes as needed for chest pain.  ? omeprazole (PRILOSEC OTC) 20 MG tablet Take 20 mg by mouth daily at 6 PM.  ? timolol (TIMOPTIC) 0.5 % ophthalmic solution Place 1 drop into both eyes 2 (two) times daily.  ? traZODone (DESYREL) 50 MG tablet TAKE 1/2 TO 1 TABLET BY MOUTH AT NIGHT AS NEEDED FOR INSOMNIA  ? TURMERIC PO Take 1,500 mg by mouth daily.  ? venlafaxine XR (EFFEXOR-XR) 37.5 MG 24 hr capsule TAKE 3 CAPSULES BY MOUTH EVERY DAY  ? zinc gluconate 50 MG tablet Take 50 mg by mouth daily at 6 PM.  ? ?No facility-administered encounter medications on file as of 04/19/2022.  ? ? ?Allergies (verified) ?Bee venom, Crestor [rosuvastatin], Iodinated contrast media, Sulfa antibiotics, and Sulfonamide derivatives  ? ?History: ?Past Medical History:  ?Diagnosis Date  ? Anxiety   ? Arthritis   ? BPH (benign prostatic hypertrophy)   ? CAD (coronary artery disease)   ? s/p STEMI 10/15/10 w/ Promus DES placed x1 in the first OM  ? Colon polyps   ? Complication of anesthesia   ? pt.  reported 2015 penile inplant when getting some anesthesia had chest pain,they readjusted  the meds. and continued with surgery. Had no further problems di not have to follow up with anyone.  ? Depression   ? Depression with anxiety   ? Dupuytren's contracture of hand   ? ED (erectile dysfunction)   ? GERD (gastroesophageal reflux disease)   ? Glaucoma   ? HLD (hyperlipidemia)   ? Hypocontractile bladder   ? Hypothyroidism   ? MI (myocardial infarction) ([place])   ? denies  ? OSA on CPAP   ? Peyronie's disease   ? Rosacea   ? Skin cancer   ? BCC  ? ?Past Surgical History:  ?Procedure Laterality Date  ? CHOLECYSTECTOMY N/A 02/10/2021  ? Procedure: LAPAROSCOPIC CHOLECYSTECTOMY;  Surgeon: Coralie Keens, MD;  Location: [facility];  Service: General;  Laterality: N/A;  ? COLONOSCOPY WITH ESOPHAGOGASTRODUODENOSCOPY  (EGD)    ? multiple  ? CORONARY STENT PLACEMENT    ? INNER EAR SURGERY    ? LAPAROSCOPIC CHOLECYSTECTOMY Bilateral 02/09/2021  ? Dr.blackmon  ? LASIK  02/1998  ? PENILE PROSTHESIS IMPLANT    ? TONSILECTOMY, ADENOIDECTOMY, BILATERAL MYRINGOTOMY AND TUBES    ? TONSILLECTOMY    ? VASECTOMY    ? WISDOM TOOTH EXTRACTION    ? ?Family History  ?Problem Relation Age of Onset  ? COPD Mother   ? Cancer Father   ?     gallbladder cancer  ? Varicose Veins Father   ? Liver cancer Father   ? Basal cell carcinoma Father   ? Basal cell carcinoma Sister   ? Melanoma Sister   ? Rheum arthritis Maternal Grandmother   ? Heart failure Maternal Grandfather   ? Colon cancer Paternal Grandmother   ? Arthritis Paternal Grandfather   ? Colon cancer Paternal Grandfather   ? COPD Other   ? ?Social History  ? ?Socioeconomic History  ? Marital status: Divorced  ?  Spouse name: Not on file  ? Number of children: 2  ? Years of education: Not on file  ? Highest education level: Bachelor's degree (e.g., BA, AB, BS)  ?Occupational History  ? Occupation: International aid/development worker  ?  Comment: retired  ?Tobacco Use  ? Smoking status: Never  ? Smokeless tobacco: Never  ?Vaping Use  ? Vaping Use: Never used  ?Substance and Sexual Activity  ? Alcohol use: No  ? Drug use: No  ? Sexual activity: Not on file  ?Other Topics Concern  ? Not on file  ?Social History Narrative  ? Divorced, 2 children  ? Retired Chief Financial Officer  ? No EtOH, tobacco, drugs  ? ?Social Determinants of Health  ? ?Financial Resource Strain: Medium Risk  ? Difficulty of Paying Living Expenses: Somewhat hard  ?Food Insecurity: No Food Insecurity  ? Worried About Charity fundraiser in the Last Year: Never true  ? Ran Out of Food in the Last Year: Never true  ?Transportation Needs: No Transportation Needs  ? Lack of Transportation (Medical): No  ? Lack of Transportation (Non-Medical): No  ?Physical Activity: Inactive  ? Days of Exercise per Week: 0 days  ? Minutes of Exercise per Session:  0 min  ?Stress: Stress Concern Present  ? Feeling of Stress : Very much  ?Social Connections: Moderately Isolated  ? Frequency of Communication with Friends and Family: Never  ? Frequency of Social Gatherings with Friends and Family: Once a week  ? Attends Religious Services: More than 4 times per  year  ? Active Member of Clubs or Organizations: Yes  ? Attends Archivist Meetings: More than 4 times per year  ? Marital Status: Divorced  ? ? ? ?Clinical Intake: ? ?BMI - recorded: 28.75 ?Nutritional Status: BMI 25 -29 Overweight ?Nutritional Risks: None ?Diabetes: No ? ?How often do you need to have someone help you when you read instructions, pamphlets, or other written materials from your doctor or pharmacy?: 1 - Never ? ?Diabetic?  No ? ? ?Activities of Daily Living ? ?  04/19/2022  ?  1:57 PM 04/16/2022  ?  3:09 PM  ?In your present state of health, do you have any difficulty performing the following activities:  ?Hearing? 0 0  ?Vision? 0 0  ?Difficulty concentrating or making decisions? 0 0  ?Walking or climbing stairs? 0 0  ?Dressing or bathing? 0 0  ?Doing errands, shopping? 0 0  ?Preparing Food and eating ? N N  ?Using the Toilet? N N  ?In the past six months, have you accidently leaked urine? N   ?Do you have problems with loss of bowel control? N N  ?Managing your Medications? N N  ?Managing your Finances? N N  ?Housekeeping or managing your Housekeeping? N N  ? ? ?Patient Care Team: ?[name], MD as PCP - General (Family Medicine) ?Elouise Munroe, MD as PCP - Cardiology (Cardiology) ?Viona Gilmore, [facility] as Pharmacist (Pharmacist) ? ?Indicate any recent Medical Services you may have received from other than [facility] providers in the past year (date may be approximate). ? ?   ?Assessment:  ? This is a routine wellness examination for Matteo. ? ?Hearing/Vision screen ?Hearing Screening - Comments:: Wears hearing aids ?Vision Screening - Comments:: Wears glasses. Followed by Dr  Manuella Ghazi ? ?Dietary issues and exercise activities discussed: ?Exercise limited by: None identified ? ? Goals Addressed   ? ?  ?  ?  ?  ?  ? This Visit's Progress  ?   Weight (lb) < 200 lb (90.7 kg) (pt-stated)   219

## 2022-04-19 NOTE — Patient Instructions (Addendum)
?Mr. Ager , ?Thank you for taking time to come for your Medicare Wellness Visit. I appreciate your ongoing commitment to your health goals. Please review the following plan we discussed and let me know if I can assist you in the future.  ? ?These are the goals we discussed: ? Goals   ? ?   Weight (lb) < 200 lb (90.7 kg) (pt-stated)   ?   I want to lose weight and work from home. ?  ?   Weight Loss Achieved   ?   Evidence-based guidance:  ?Review medication that may contribute to weight gain, such as corticosteroid, beta-blocker, tricyclic antidepressant, oral antihyperglycemic; advocate for changes when appropriate.  ?Perform or refer to registered dietitian to perform comprehensive nutrition assessment that includes disordered-eating behaviors, such as binge-eating, emotional or compulsive eating, grazing.  ?Counsel patient regarding health risks of obesity and that weight loss goal of 5 to 10 percent of initial weight will improve risk.  ?Recommend initial weight loss goal of 3 to 5 percent of bodyweight; increase weight-loss goals based on patient success as achieving greater weight loss continues to reduce risk.  ?Propose a calorie-reduced diet based on the patient's preferences and health status.  ?Provide ongoing emotional support or cognitive behavioral therapy and dietitian services (individual, group, virtual) over at least 6 months with a minimum of 14 encounters to best facilitate weight loss.  ?Provide monthly follow-up for 12 months when weight loss goal is met to assist with maintenance of weight loss.  ?Encourage increased physical activity or exercise based on individual age, risk, and ability up to 200 to 300 minutes per week that includes aerobic and resistance training.  ?Encourage reduction in sedentary behaviors by replacing them with nonexercise yet active leisure pursuits.  ?Identify physical barriers, such as change in posture, balance, gait patterns, joint pain, and environmental barriers  to activity.  ?Consider referral to rehabilitation therapy, especially when mobility or function is impaired due to osteoarthritis and obesity.  ?Consider referral to weight-loss program that has published evidence of safety and efficacy if on-site intensive intervention is unavailable or patient preference.  ?Prepare patient for use of pharmacologic therapy as an adjunct to lifestyle changes based on body mass index, patient agreement and presence of risk factors or comorbidities.  ?Evaluate efficacy of pharmacologic therapy (weight loss) and tolerance to medication periodically.  ?Engage in shared decision-making regarding referral to bariatric surgeon for consultation and evaluation when weight-loss goal has not been accomplished by behavioral therapy with or without pharmacologic therapy.   ?Notes:  ?  ? ?  ?  ?This is a list of the screening recommended for you and due dates:  ?Health Maintenance  ?Topic Date Due  ? Flu Shot  07/06/2022  ? Colon Cancer Screening  07/11/2028  ? Tetanus Vaccine  08/21/2028  ? Pneumonia Vaccine  Completed  ? Hepatitis C Screening: USPSTF Recommendation to screen - Ages 46-79 yo.  Completed  ? Zoster (Shingles) Vaccine  Completed  ? HPV Vaccine  Aged Out  ? COVID-19 Vaccine  Discontinued  ? ?Advanced directives: Yes Copies on file ? ?Conditions/risks identified: None ? ?Next appointment: Follow up in one year for your annual wellness visit.  ? ?Preventive Care 71 Years and Older, Male ?Preventive care refers to lifestyle choices and visits with your health care provider that can promote health and wellness. ?What does preventive care include? ?A yearly physical exam. This is also called an annual well check. ?Dental exams once or twice a  year. ?Routine eye exams. Ask your health care provider how often you should have your eyes checked. ?Personal lifestyle choices, including: ?Daily care of your teeth and gums. ?Regular physical activity. ?Eating a healthy diet. ?Avoiding tobacco  and drug use. ?Limiting alcohol use. ?Practicing safe sex. ?Taking low doses of aspirin every day. ?Taking vitamin and mineral supplements as recommended by your health care provider. ?What happens during an annual well check? ?The services and screenings done by your health care provider during your annual well check will depend on your age, overall health, lifestyle risk factors, and family history of disease. ?Counseling  ?Your health care provider may ask you questions about your: ?Alcohol use. ?Tobacco use. ?Drug use. ?Emotional well-being. ?Home and relationship well-being. ?Sexual activity. ?Eating habits. ?History of falls. ?Memory and ability to understand (cognition). ?Work and work Statistician. ?Screening  ?You may have the following tests or measurements: ?Height, weight, and BMI. ?Blood pressure. ?Lipid and cholesterol levels. These may be checked every 5 years, or more frequently if you are over 24 years old. ?Skin check. ?Lung cancer screening. You may have this screening every year starting at age 83 if you have a 30-pack-year history of smoking and currently smoke or have quit within the past 15 years. ?Fecal occult blood test (FOBT) of the stool. You may have this test every year starting at age 8. ?Flexible sigmoidoscopy or colonoscopy. You may have a sigmoidoscopy every 5 years or a colonoscopy every 10 years starting at age 58. ?Prostate cancer screening. Recommendations will vary depending on your family history and other risks. ?Hepatitis C blood test. ?Hepatitis B blood test. ?Sexually transmitted disease (STD) testing. ?Diabetes screening. This is done by checking your blood sugar (glucose) after you have not eaten for a while (fasting). You may have this done every 1-3 years. ?Abdominal aortic aneurysm (AAA) screening. You may need this if you are a current or former smoker. ?Osteoporosis. You may be screened starting at age 53 if you are at high risk. ?Talk with your health care provider  about your test results, treatment options, and if necessary, the need for more tests. ?Vaccines  ?Your health care provider may recommend certain vaccines, such as: ?Influenza vaccine. This is recommended every year. ?Tetanus, diphtheria, and acellular pertussis (Tdap, Td) vaccine. You may need a Td booster every 10 years. ?Zoster vaccine. You may need this after age 90. ?Pneumococcal 13-valent conjugate (PCV13) vaccine. One dose is recommended after age 53. ?Pneumococcal polysaccharide (PPSV23) vaccine. One dose is recommended after age 44. ?Talk to your health care provider about which screenings and vaccines you need and how often you need them. ?This information is not intended to replace advice given to you by your health care provider. Make sure you discuss any questions you have with your health care provider. ?Document Released: 12/19/2015 Document Revised: 08/11/2016 Document Reviewed: 09/23/2015 ?Elsevier Interactive Patient Education ? 2017 [place]. ? ?Fall Prevention in the Home ?Falls can cause injuries. They can happen to people of all ages. There are many things you can do to make your home safe and to help prevent falls. ?What can I do on the outside of my home? ?Regularly fix the edges of walkways and driveways and fix any cracks. ?Remove anything that might make you trip as you walk through a door, such as a raised step or threshold. ?Trim any bushes or trees on the path to your home. ?Use bright outdoor lighting. ?Clear any walking paths of anything that might make someone  trip, such as rocks or tools. ?Regularly check to see if handrails are loose or broken. Make sure that both sides of any steps have handrails. ?Any raised decks and porches should have guardrails on the edges. ?Have any leaves, snow, or ice cleared regularly. ?Use sand or salt on walking paths during winter. ?Clean up any spills in your garage right away. This includes oil or grease spills. ?What can I do in the  bathroom? ?Use night lights. ?Install grab bars by the toilet and in the tub and shower. Do not use towel bars as grab bars. ?Use non-skid mats or decals in the tub or shower. ?If you need to sit down in the sh

## 2022-04-21 ENCOUNTER — Encounter: Payer: Self-pay | Admitting: Family Medicine

## 2022-04-21 DIAGNOSIS — R519 Headache, unspecified: Secondary | ICD-10-CM

## 2022-04-27 ENCOUNTER — Other Ambulatory Visit (INDEPENDENT_AMBULATORY_CARE_PROVIDER_SITE_OTHER): Payer: Medicare Other

## 2022-04-27 ENCOUNTER — Encounter: Payer: Self-pay | Admitting: Family Medicine

## 2022-04-27 DIAGNOSIS — G8929 Other chronic pain: Secondary | ICD-10-CM | POA: Diagnosis not present

## 2022-04-27 DIAGNOSIS — R519 Headache, unspecified: Secondary | ICD-10-CM

## 2022-04-27 LAB — SEDIMENTATION RATE: Sed Rate: 18 mm/hr (ref 0–20)

## 2022-05-05 ENCOUNTER — Other Ambulatory Visit: Payer: Self-pay

## 2022-05-05 ENCOUNTER — Encounter (HOSPITAL_BASED_OUTPATIENT_CLINIC_OR_DEPARTMENT_OTHER): Payer: Self-pay | Admitting: Emergency Medicine

## 2022-05-05 ENCOUNTER — Emergency Department (HOSPITAL_BASED_OUTPATIENT_CLINIC_OR_DEPARTMENT_OTHER)
Admission: EM | Admit: 2022-05-05 | Discharge: 2022-05-05 | Disposition: A | Discharge status: Home / Self Care | Payer: Medicare Other | Attending: Emergency Medicine | Admitting: Emergency Medicine

## 2022-05-05 ENCOUNTER — Encounter: Payer: Self-pay | Admitting: Family Medicine

## 2022-05-05 ENCOUNTER — Emergency Department (HOSPITAL_BASED_OUTPATIENT_CLINIC_OR_DEPARTMENT_OTHER): Payer: Medicare Other | Admitting: Radiology

## 2022-05-05 DIAGNOSIS — R0789 Other chest pain: Secondary | ICD-10-CM | POA: Diagnosis not present

## 2022-05-05 DIAGNOSIS — R079 Chest pain, unspecified: Secondary | ICD-10-CM | POA: Diagnosis not present

## 2022-05-05 DIAGNOSIS — Z7982 Long term (current) use of aspirin: Secondary | ICD-10-CM | POA: Insufficient documentation

## 2022-05-05 LAB — CBC
HCT: 43.7 % (ref 39.0–52.0)
Hemoglobin: 15.1 g/dL (ref 13.0–17.0)
MCH: 34.6 pg — ABNORMAL HIGH (ref 26.0–34.0)
MCHC: 34.6 g/dL (ref 30.0–36.0)
MCV: 100 fL (ref 80.0–100.0)
Platelets: 190 10*3/uL (ref 150–400)
RBC: 4.37 MIL/uL (ref 4.22–5.81)
RDW: 11.9 % (ref 11.5–15.5)
WBC: 5.6 10*3/uL (ref 4.0–10.5)
nRBC: 0 % (ref 0.0–0.2)

## 2022-05-05 LAB — BASIC METABOLIC PANEL
Anion gap: 8 (ref 5–15)
BUN: 22 mg/dL (ref 8–23)
CO2: 25 mmol/L (ref 22–32)
Calcium: 9.7 mg/dL (ref 8.9–10.3)
Chloride: 103 mmol/L (ref 98–111)
Creatinine, Ser: 1.1 mg/dL (ref 0.61–1.24)
GFR, Estimated: >60 mL/min (ref >60)
Glucose, Bld: 100 mg/dL — ABNORMAL HIGH (ref 70–99)
Potassium: 4.7 mmol/L (ref 3.5–5.1)
Sodium: 136 mmol/L (ref 135–145)

## 2022-05-05 LAB — TROPONIN I (HIGH SENSITIVITY)
Troponin I (High Sensitivity): 9 ng/L (ref <18)
Troponin I (High Sensitivity): 10 ng/L (ref <18)

## 2022-05-05 NOTE — Telephone Encounter (Signed)
Returned call to pt he states that he is having chest discomfort, this is similar to what was happening before the last appointment in March. He states that he thinks that this is coming from his thyroid issues. He is also having pain in his should and thyroid. He states that he called Endo and they couldn't find an appointment until 07-15-22. Asked pt if he explained that he was having these sx and he stated that he "thinks so" did not remember. Informed pt that he should call and see if they could fit him in today/tomorrow, he will call them and see if they can give him an urgent appt for this, if not he should go to the ER today for evaluation. Verbalized understanding. He states he will call and go to the ER if they cannot fit him in today. I have scheduled him an appointment 05-11-22 at 1055 am. He will arrive early as [place]yse Amass runs mostly on time.

## 2022-05-05 NOTE — Telephone Encounter (Signed)
Pt is in the ER  °

## 2022-05-05 NOTE — ED Provider Notes (Signed)
[facility] EMERGENCY DEPT Provider Note   CSN: [id number] Arrival date & time: 05/05/22  1218     History  Chief Complaint  Patient presents with   Chest Pain    Matthew Pratt is a 71 y.o. male.  Patient presents ER chief complaint of chest pain ongoing for about 2 weeks intermittently.  Describes as mid to left-sided chest pain.  Nonradiating.  He had an episode last night and presents to the ER today.  Symptoms lasted for several hours and continued this morning as well.  No associated fevers no cough no vomiting or diarrhea.      Home Medications Prior to Admission medications   Medication Sig Start Date End Date Taking? Authorizing Provider  aspirin EC 81 MG tablet Take 81 mg by mouth every morning.    [provider]  b complex vitamins capsule Take 1 capsule by mouth daily at 6 PM.    [provider]  calcium carbonate (OS-CAL) 600 MG TABS tablet Take 600 mg by mouth daily.    [provider]  Cholecalciferol (VITAMIN D3) 125 MCG (5000 UT) CAPS Take 5,000 Units by mouth daily at 6 PM.    [provider]  EPINEPHrine 0.3 mg/0.3 mL IJ SOAJ injection Inject into the muscle as needed. 09/23/21   [provider]  latanoprost (XALATAN) 0.005 % ophthalmic solution Place 1 drop into both eyes at bedtime. 05/15/20   [provider]  levothyroxine (SYNTHROID) 125 MCG tablet Take 1 tablet (125 mcg total) by mouth daily. 12/16/21   Burchette, Alinda Sierras, MD  liothyronine (CYTOMEL) 25 MCG tablet Take one half tablet by mouth once daily. 03/30/22   Burchette, Alinda Sierras, MD  nitroGLYCERIN (NITROSTAT) 0.4 MG SL tablet Place 1 tablet (0.4 mg total) under the tongue every 5 (five) minutes as needed for chest pain. 02/23/22 05/24/22  Deberah Pelton, NP  omeprazole (PRILOSEC OTC) 20 MG tablet Take 20 mg by mouth daily at 6 PM.    [provider]  timolol (TIMOPTIC) 0.5 % ophthalmic solution Place 1 drop into both eyes 2  (two) times daily.    [provider]  traZODone (DESYREL) 50 MG tablet TAKE 1/2 TO 1 TABLET BY MOUTH AT NIGHT AS NEEDED FOR INSOMNIA 12/28/21   Burchette, Alinda Sierras, MD  TURMERIC PO Take 1,500 mg by mouth daily.    [provider]  venlafaxine XR (EFFEXOR-XR) 37.5 MG 24 hr capsule TAKE 3 CAPSULES BY MOUTH EVERY DAY 10/21/21   Burchette, Alinda Sierras, MD  zinc gluconate 50 MG tablet Take 50 mg by mouth daily at 6 PM.    [provider]      Allergies    Bee venom, Crestor [rosuvastatin], Iodinated contrast media, Sulfa antibiotics, and Sulfonamide derivatives    Review of Systems   Review of Systems  Constitutional:  Negative for fever.  HENT:  Negative for ear pain and sore throat.   Eyes:  Negative for pain.  Respiratory:  Negative for cough.   Cardiovascular:  Positive for chest pain.  Gastrointestinal:  Negative for abdominal pain.  Genitourinary:  Negative for flank pain.  Musculoskeletal:  Negative for back pain.  Skin:  Negative for color change and rash.  Neurological:  Negative for syncope.  All other systems reviewed and are negative.  Physical Exam Updated Vital Signs BP 124/77   Pulse (!) 45   Temp 98.2 F (36.8 C)   Resp 13   Ht '6\' 1"'$  (1.854 m)  Wt 97.5 kg   SpO2 98%   BMI 28.37 kg/m  Physical Exam Constitutional:      Appearance: He is well-developed.  HENT:     Head: Normocephalic.     Nose: Nose normal.  Eyes:     Extraocular Movements: Extraocular movements intact.  Cardiovascular:     Rate and Rhythm: Normal rate.  Pulmonary:     Effort: Pulmonary effort is normal.  Skin:    Coloration: Skin is not jaundiced.  Neurological:     Mental Status: He is alert. Mental status is at baseline.    ED Results / Procedures / Treatments   Labs (all labs ordered are listed, but only abnormal results are displayed) Labs Reviewed  BASIC METABOLIC PANEL - Abnormal; Notable for the following components:      Result Value   Glucose, Bld  100 (*)    All other components within normal limits  CBC - Abnormal; Notable for the following components:   MCH 34.6 (*)    All other components within normal limits  TROPONIN I (HIGH SENSITIVITY)  TROPONIN I (HIGH SENSITIVITY)    EKG EKG Interpretation  Date/Time:  Wednesday May 05 2022 12:27:21 EDT Ventricular Rate:  60 PR Interval:  162 QRS Duration: 72 QT Interval:  426 QTC Calculation: 426 R Axis:   22 Text Interpretation: Normal sinus rhythm Normal ECG When compared with ECG of 27-Jun-2020 13:14, QRS axis Shifted right Criteria for Inferior infarct are no longer Present Confirmed by Thamas Jaegers ([id number]) on 05/05/2022 1:13:44 PM  Radiology DG Chest 2 View  Result Date: 05/05/2022 CLINICAL DATA:  Provided history: Chest pain. Additional history provided by scanning technologist: Patient reports chest pain for 2 weeks. EXAM: CHEST - 2 VIEW COMPARISON:  None Available. FINDINGS: Heart size within normal limits. Aortic atherosclerosis. No appreciable airspace consolidation or pulmonary edema. No evidence of pleural effusion or pneumothorax. No acute bony abnormality identified. 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 IMPRESSION: No evidence of acute cardiopulmonary abnormality. Aortic Atherosclerosis (ICD10-I70.0). 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 Electronically Signed   By: Kellie Simmering D.O.   On: 05/05/2022 13:07    Procedures Procedures    Medications Ordered in ED Medications - No data to display  ED Course/ Medical Decision Making/ A&P                           Medical Decision Making Amount and/or Complexity of Data Reviewed Labs: ordered. Radiology: ordered.   Chart review shows visit with outpatient physician March 28, 2022.  Cardiac monitor shows sinus rhythm.  Sinus surgical labs CBC normal chemistry normal, initial troponin negative.  Chest x-ray unremarkable.  Pending second troponin.   Patient signed to oncoming provider.        Final Clinical Impression(s) / ED Diagnoses Final diagnoses:  Chest pain, unspecified type    Rx / DC Orders ED Discharge Orders     None         Luna Fuse, MD 05/05/22 1506

## 2022-05-05 NOTE — ED Provider Notes (Signed)
  Physical Exam  BP 119/75   Pulse (!) 46   Temp 98.2 F (36.8 C)   Resp 12   Ht '6\' 1"'$  (1.854 m)   Wt 97.5 kg   SpO2 98%   BMI 28.37 kg/m   Physical Exam  Procedures  Procedures  ED Course / MDM    Medical Decision Making Amount and/or Complexity of Data Reviewed Labs: ordered. Radiology: ordered.   Received patient in signout.  Is pending second troponin.  Second troponin stable.  Cardiac ischemia felt less likely.Marland Kitchen  Stable for discharge with PCP follow       Davonna Belling, MD 05/05/22 1544

## 2022-05-05 NOTE — ED Triage Notes (Signed)
Chest pain started 2 weeks ago, pt describes in as variable, mid chest pain. States he is having thyroid problems that started around the same time as symptoms. 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 Otc tylenol helps.

## 2022-05-09 NOTE — Progress Notes (Unsigned)
Cardiology Office Note:    Date:  05/11/2022   ID:  Matthew Pratt, DOB Jun 19, 1951, MRN [id number]  PCP:  [name], MD   [facility] Providers Cardiologist:  Elouise Munroe, MD      Referring MD: [name], MD   Follow-up for coronary artery disease and hyperlipidemia  History of Present Illness:    Matthew Pratt is a 71 y.o. male with a hx of STEMI 11/11 with PCI and DES to first OM, BPH, hyperlipidemia, OSA on CPAP and anxiety.  He was seen by Dr.Acharya on 12/30/2020.  During that time he was being evaluated for elective cholecystectomy by Dr. Ninfa Linden.  He was able to complete greater than 4 METS of physical activity and denied DOE.  His symptoms for STEMI were reviewed.  He noted traditional pressing chest discomfort during the episode.  He denied recurrent chest pain.  He reported feeling well since his previous cardiology visit.  He reported using his recumbent bike regularly and denied cardiac symptoms.  Here reported compliance with his CPAP device.  He also mentioned a episode of syncope that he had 35 years ago while he was at work.  He remembered/described tunnel vision.  He presented to the clinic  for follow-up evaluation 02/16/2022 stated he had an episode of chest discomfort for about 2 to 3 days in the setting of a GI virus.  He noted about a week of diarrhea with the virus.  He described chest discomfort that radiated across his right and left side of his chest.  He took extra aspirin to help with this discomfort.  He also noticed increased work of breathing during period of virus.  He reported that his breathing had returned to normal.  He no longer had any chest discomfort.  His EKG  showed sinus bradycardia 50 bpm.  We reviewed his previous echocardiogram.  His chest pain appeared to be atypical chest discomfort versus musculoskeletal discomfort in the setting GI virus.  He reported that he had been staying well-hydrated.  He reported that he  did not regularly exercise.  He had a recumbent bike and free weights at home.  I expressed the importance of regular physical activity as it relates to cardiac health.  He expressed understanding.  I  ordered a BMP and magnesium.  I have asked him to increase his physical activity with a goal of reaching 150 minutes of moderate physical activity per week.  We will plan follow-up in 9 months. Labs were unremarkable.  He presented to the emergency department 05/05/2022 with chest discomfort.  His blood pressure was 119/75, pulse 46, EKG showed normal sinus rhythm 60 bpm, CBC normal, troponins negative, chest x-ray unremarkable.  He complained of ongoing intermittent 2-week chest discomfort on his mid to the left side.  It was nonradiating.  He reported an episode the previous night and presented to the emergency department.  His symptoms had lasted for several hours and continued into the morning.  He denied fever, cough, vomiting, diarrhea.  He presents to the clinic today for follow-up evaluation states he presented to the emergency department for evaluation.  We reviewed his labs and diagnostics.  He expressed understanding.  He wishes to increase physical activity and lose weight.  We reviewed healthy BMI and weight for his height and age.  He expressed understanding.  He has plans to follow-up with endocrinology to adjust his thyroid medication.  We will plan follow-up in 9 to 12 months.  Today he denies chest pain, increased shortness of breath, lower extremity edema, fatigue, palpitations, melena, hematuria, hemoptysis, diaphoresis, weakness, presyncope, syncope, orthopnea, and PND.   Past Medical History:  Diagnosis Date   Anxiety    Arthritis    BPH (benign prostatic hypertrophy)    CAD (coronary artery disease)    s/p STEMI 10/15/10 w/ Promus DES placed x1 in the first OM   Colon polyps    Complication of anesthesia    pt. reported 2015 penile inplant when getting some anesthesia had chest  pain,they readjusted  the meds. and continued with surgery. Had no further problems di not have to follow up with anyone.   Depression    Depression with anxiety    Dupuytren's contracture of hand    ED (erectile dysfunction)    GERD (gastroesophageal reflux disease)    Glaucoma    HLD (hyperlipidemia)    Hypocontractile bladder    Hypothyroidism    MI (myocardial infarction) ([place])    denies   OSA on CPAP    Peyronie's disease    Rosacea    Skin cancer    BCC    Past Surgical History:  Procedure Laterality Date   CHOLECYSTECTOMY N/A 02/10/2021   Procedure: LAPAROSCOPIC CHOLECYSTECTOMY;  Surgeon: Coralie Keens, MD;  Location: [facility];  Service: General;  Laterality: N/A;   COLONOSCOPY WITH ESOPHAGOGASTRODUODENOSCOPY (EGD)     multiple   CORONARY STENT PLACEMENT     INNER EAR SURGERY     LAPAROSCOPIC CHOLECYSTECTOMY Bilateral 02/09/2021   Dr.blackmon   LASIK  02/1998   PENILE PROSTHESIS IMPLANT     TONSILECTOMY, ADENOIDECTOMY, BILATERAL MYRINGOTOMY AND TUBES     TONSILLECTOMY     VASECTOMY     WISDOM TOOTH EXTRACTION      Current Medications: Current Meds  Medication Sig   aspirin EC 81 MG tablet Take 81 mg by mouth every morning.   b complex vitamins capsule Take 1 capsule by mouth daily at 6 PM.   calcium carbonate (OS-CAL) 600 MG TABS tablet Take 600 mg by mouth daily.   Cholecalciferol (VITAMIN D3) 125 MCG (5000 UT) CAPS Take 5,000 Units by mouth daily at 6 PM.   EPINEPHrine 0.3 mg/0.3 mL IJ SOAJ injection Inject into the muscle as needed.   latanoprost (XALATAN) 0.005 % ophthalmic solution Place 1 drop into both eyes at bedtime.   levothyroxine (SYNTHROID) 125 MCG tablet Take 1 tablet (125 mcg total) by mouth daily.   nitroGLYCERIN (NITROSTAT) 0.4 MG SL tablet Place 1 tablet (0.4 mg total) under the tongue every 5 (five) minutes as needed for chest pain.   omeprazole (PRILOSEC OTC) 20 MG tablet Take 20 mg by mouth daily at 6 PM.   timolol (TIMOPTIC) 0.5 %  ophthalmic solution Place 1 drop into both eyes 2 (two) times daily.   traZODone (DESYREL) 50 MG tablet TAKE 1/2 TO 1 TABLET BY MOUTH AT NIGHT AS NEEDED FOR INSOMNIA   TURMERIC PO Take 1,500 mg by mouth daily.   venlafaxine XR (EFFEXOR-XR) 37.5 MG 24 hr capsule TAKE 3 CAPSULES BY MOUTH EVERY DAY   zinc gluconate 50 MG tablet Take 50 mg by mouth daily at 6 PM.     Allergies:   Bee venom, Crestor [rosuvastatin], Iodinated contrast media, Sulfa antibiotics, and Sulfonamide derivatives   Social History   Socioeconomic History   Marital status: Divorced    Spouse name: Not on file   Number of children: 2   Years of education: Not  on file   Highest education level: Bachelor's degree (e.g., BA, AB, BS)  Occupational History   Occupation: International aid/development worker    Comment: retired  Tobacco Use   Smoking status: Never   Smokeless tobacco: Never  Vaping Use   Vaping Use: Never used  Substance and Sexual Activity   Alcohol use: No   Drug use: No   Sexual activity: Not on file  Other Topics Concern   Not on file  Social History Narrative   Divorced, 2 children   Retired Chief Financial Officer   No EtOH, tobacco, drugs   Social Determinants of Health   Financial Resource Strain: Medium Risk   Difficulty of Paying Living Expenses: Somewhat hard  Food Insecurity: No Food Insecurity   Worried About Charity fundraiser in the Last Year: Never true   Arboriculturist in the Last Year: Never true  Transportation Needs: No Transportation Needs   Lack of Transportation (Medical): No   Lack of Transportation (Non-Medical): No  Physical Activity: Inactive   Days of Exercise per Week: 0 days   Minutes of Exercise per Session: 0 min  Stress: Stress Concern Present   Feeling of Stress : Very much  Social Connections: Moderately Isolated   Frequency of Communication with Friends and Family: Never   Frequency of Social Gatherings with Friends and Family: Once a week   Attends Religious Services:  More than 4 times per year   Active Member of [employer] or Organizations: Yes   Attends Music therapist: More than 4 times per year   Marital Status: Divorced     Family History: The patient's family history includes Arthritis in his paternal grandfather; Basal cell carcinoma in his father and sister; COPD in his mother and another family member; Cancer in his father; Colon cancer in his paternal grandfather and paternal grandmother; Heart failure in his maternal grandfather; Liver cancer in his father; Melanoma in his sister; Rheum arthritis in his maternal grandmother; Varicose Veins in his father.  ROS:   Please see the history of present illness.     All other systems reviewed and are negative.   Risk Assessment/Calculations:           Physical Exam:    VS:  BP 120/72   Pulse (!) 55   Ht '6\' 1"'$  (1.854 m)   Wt 218 lb 6.4 oz (99.1 kg)   SpO2 97%   BMI 28.81 kg/m     Wt Readings from Last 3 Encounters:  05/11/22 218 lb 6.4 oz (99.1 kg)  05/05/22 215 lb (97.5 kg)  04/19/22 219 lb 3.2 oz (99.4 kg)     GEN:  Well nourished, well developed in no acute distress HEENT: Normal NECK: No JVD; No carotid bruits LYMPHATICS: No lymphadenopathy CARDIAC: RRR, no murmurs, rubs, gallops RESPIRATORY:  Clear to auscultation without rales, wheezing or rhonchi  ABDOMEN: Soft, non-tender, non-distended MUSCULOSKELETAL:  No edema; No deformity  SKIN: Warm and dry NEUROLOGIC:  Alert and oriented x 3 PSYCHIATRIC:  Normal affect    EKGs/Labs/Other Studies Reviewed:    The following studies were reviewed today:  Echocardiogram 01/20/2021 IMPRESSIONS     1. Left ventricular ejection fraction, by estimation, is 55 to 60%. The  left ventricle has normal function. The left ventricle has no regional  wall motion abnormalities. There is mild left ventricular hypertrophy.  Left ventricular diastolic parameters  are indeterminate.   2. Right ventricular systolic function is  normal. The right  ventricular  size is normal.   3. The mitral valve is normal in structure. Trivial mitral valve  regurgitation. No evidence of mitral stenosis.   4. The aortic valve is normal in structure. Aortic valve regurgitation is  mild. No aortic stenosis is present.  EKG:  EKG is  ordered today.  The ekg ordered today demonstrates sinus bradycardia no ST or T wave deviation 50 bpm.  Recent Labs: 11/23/2021: ALT 32 02/16/2022: Magnesium 2.0 02/22/2022: TSH 3.82 05/05/2022: BUN 22; Creatinine, Ser 1.10; Hemoglobin 15.1; Platelets 190; Potassium 4.7; Sodium 136  Recent Lipid Panel    Component Value Date/Time   CHOL 163 03/25/2021 0954   TRIG 101 03/25/2021 0954   HDL 59 03/25/2021 0954   CHOLHDL 2.8 03/25/2021 0954   CHOLHDL 4.7 06/30/2020 0305   VLDL 18 06/30/2020 0305   LDLCALC 86 03/25/2021 0954    ASSESSMENT & PLAN    Atypical chest discomfort-no further episodes.  Presented to the emergency department 05/05/2022.  Labs unremarkable, chest x-ray unremarkable.  Cardiac enzymes negative. Felt to be related to thyroid-has appointment with endocrinology 07/15/2022  Coronary artery disease-no further episodes of chest discomfort. Underwent cardiac catheterization in the setting of STEMI 11/11 then received PCI with DES to his OM1.  Reviewed healthy BMI and goal weight of 185-188.8 pounds Continue omega-3 fatty acids, aspirin, Heart healthy low-sodium diet-salty 6 given Increase physical activity as tolerated   Hyperlipidemia-LDL 86 Statin intolerant, noted myalgias with statin therapy.  Previously offered referral to pharmacy for evaluation of PCSK9 inhibitor therapy.  (Praluent, Repatha, may be candidate for inclisiran Continue omega-3 fatty acids, turmeric Heart healthy low-sodium high-fiber diet Increase physical activity as tolerated  Sinus bradycardia-HR today 55 Continue to monitor Heart healthy low-sodium diet-salty 6 given Increase physical activity as  tolerated  Obstructive sleep apnea-reports intermittent compliance with CPAP.  He reports his intermittent use is related to issues with keeping up with cleaning his device. Continue CPAP use  Disposition: Follow-up with Dr. Margaretann Loveless or me 9-12 months.       Medication Adjustments/Labs and Tests Ordered: Current medicines are reviewed at length with the patient today.  Concerns regarding medicines are outlined above.  No orders of the defined types were placed in this encounter.  No orders of the defined types were placed in this encounter.   There are no Patient Instructions on file for this visit.   Signed, Deberah Pelton, NP  05/11/2022 10:53 AM      Notice: This dictation was prepared with Dragon dictation along with smaller phrase technology. Any transcriptional errors that result from this process are unintentional and may not be corrected upon review.  I spent 14 minutes examining this patient, reviewing medications, and using patient centered shared decision making involving her cardiac care.  Prior to her visit I spent greater than 20 minutes reviewing her past medical history,  medications, and prior cardiac tests.

## 2022-05-11 ENCOUNTER — Encounter: Payer: Self-pay | Admitting: General Practice

## 2022-05-11 ENCOUNTER — Ambulatory Visit (INDEPENDENT_AMBULATORY_CARE_PROVIDER_SITE_OTHER): Payer: Medicare Other | Admitting: General Practice

## 2022-05-11 VITALS — BP 120/72 | HR 55 | Ht 73.0 in | Wt 218.4 lb

## 2022-05-11 DIAGNOSIS — R0789 Other chest pain: Secondary | ICD-10-CM | POA: Diagnosis not present

## 2022-05-11 DIAGNOSIS — I495 Sick sinus syndrome: Secondary | ICD-10-CM

## 2022-05-11 DIAGNOSIS — G4733 Obstructive sleep apnea (adult) (pediatric): Secondary | ICD-10-CM | POA: Diagnosis not present

## 2022-05-11 DIAGNOSIS — E785 Hyperlipidemia, unspecified: Secondary | ICD-10-CM

## 2022-05-11 DIAGNOSIS — I2510 Atherosclerotic heart disease of native coronary artery without angina pectoris: Secondary | ICD-10-CM

## 2022-05-11 NOTE — Patient Instructions (Signed)
Medication Instructions:  The current medical regimen is effective;  continue present plan and medications as directed. Please refer to the Current Medication list given to you today.   *If you need a refill on your cardiac medications before your next appointment, please call your pharmacy*  Lab Work:   Testing/Procedures:  NONE    NONE If you have labs (blood work) drawn today and your tests are completely normal, you will receive your results only by: [place] (if you have MyChart) OR  A paper copy in the mail If you have any lab test that is abnormal or we need to change your treatment, we will call you to review the results.  Special Instructions YOUR HEALTHY WEIGHT IS 185-190 POUNDS.  Follow-Up: Your next appointment:  9-12 month(s) In Person with Elouise Munroe, MD  or [name], FNP   At [facility], you and your health needs are our priority.  As part of our continuing mission to provide you with exceptional heart care, we have created designated Provider Care Teams.  These Care Teams include your primary Cardiologist (physician) and Advanced Practice Providers (APPs -  Physician Assistants and Nurse Practitioners) who all work together to provide you with the care you need, when you need it.    Important Information About Sugar

## 2022-05-23 IMAGING — MR MR MRCP
11 of 14 series · 41 of 48 positions shown · non-contrast
Comparison: CT AP from 06/27/2020.

CLINICAL DATA: Gallstones.  Epigastric pain.

EXAM:
MRI ABDOMEN WITHOUT CONTRAST  (INCLUDING MRCP)
TECHNIQUE: Multiplanar multisequence MR imaging of the abdomen was performed.
Heavily T2-weighted images of the biliary and pancreatic ducts were
obtained, and three-dimensional MRCP images were rendered by post
processing.

[Series 3: ax haste · axial · 6.0mm · 1.19mm/px · z∈[-178,+74]mm · 2 of 36 slices shown]
[im 1/36]
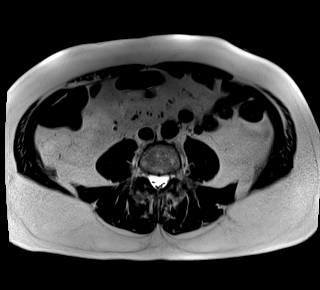
[im 36/36]
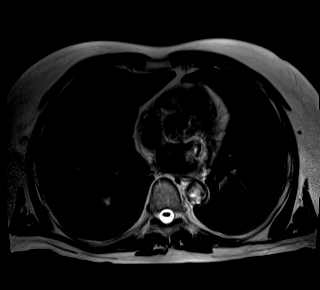

[Series 4: bSSFP · coronal · 6.0mm · 0.74mm/px · 3 of 34 slices shown]
[im 1/34]
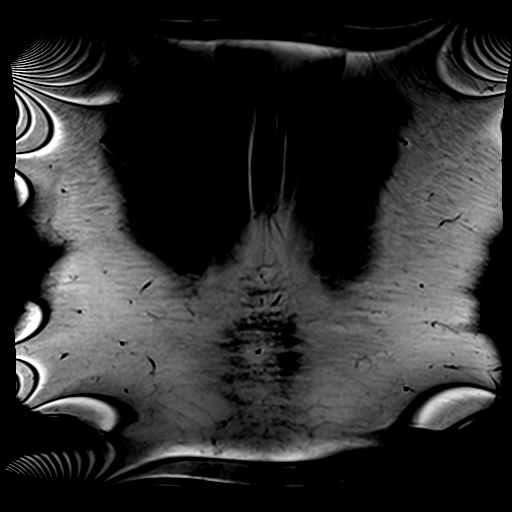
[im 17/34]
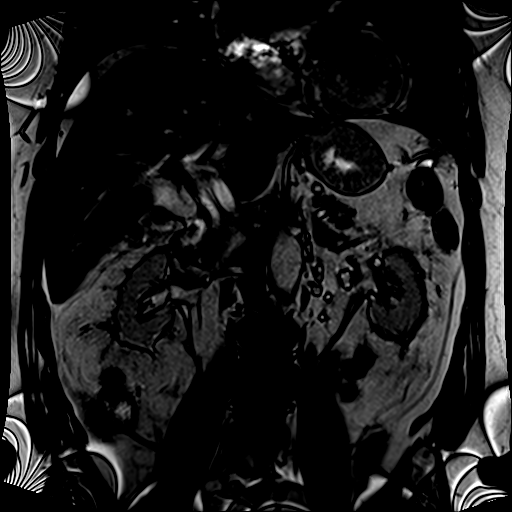
[im 34/34]
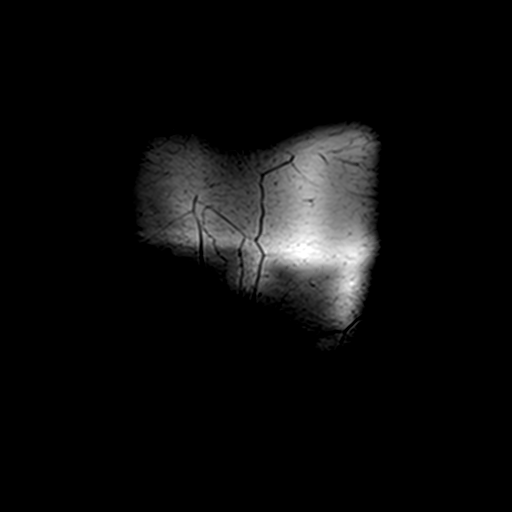

[Series 7: T2 fat-sat · axial · 6.0mm · 1.19mm/px · z∈[-126,+111]mm · 3 of 34 slices shown]
[im 1/34]
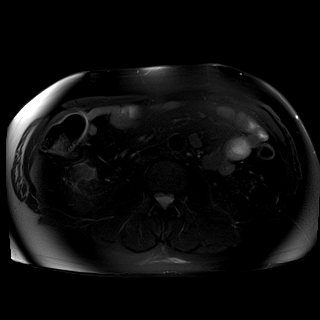
[im 17/34]
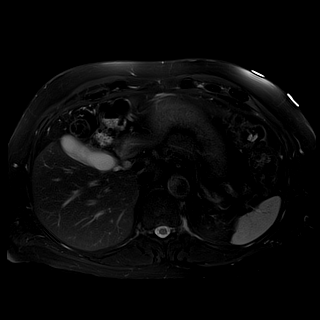
[im 34/34]
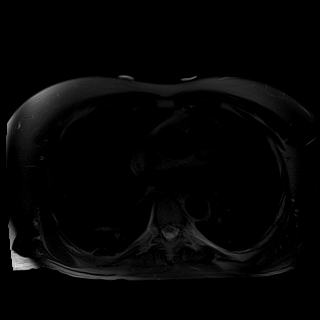

[Series 8: DWI · axial · 6.0mm · 1.42mm/px · z∈[-120,+117]mm · 8 of 102 slices shown (1 of 2)]
[im 1/102]
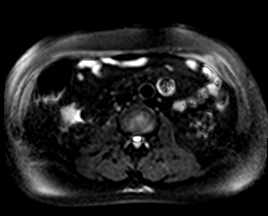
[im 15/102]
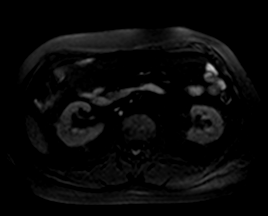
[im 29/102]
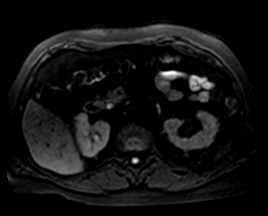
[im 44/102]
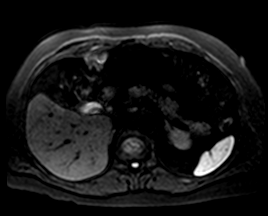
[im 58/102]
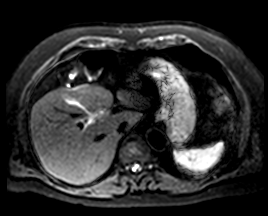
[im 73/102]
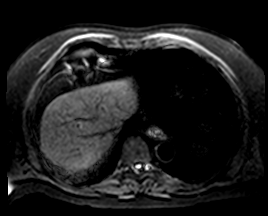
[im 87/102]
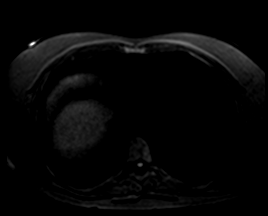
[im 102/102]
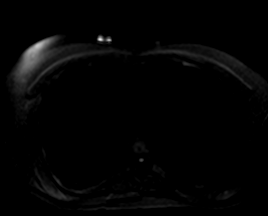

[Series 9: DWI · axial · 6.0mm · 1.42mm/px · z∈[-120,+117]mm · 3 of 34 slices shown (2 of 2)]
[im 1/34]
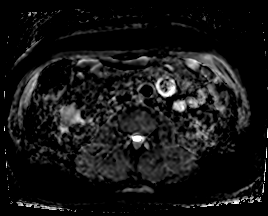
[im 17/34]
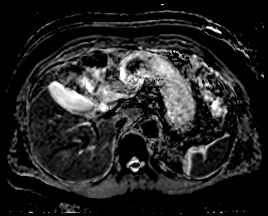
[im 34/34]
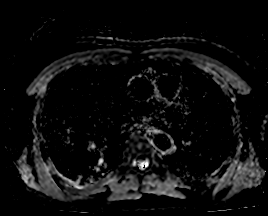

[Series 10: ax in and · axial · 3.0mm · 1.19mm/px · z∈[-118,+118]mm · 6 of 80 slices shown (1 of 2)]
[im 1/80]
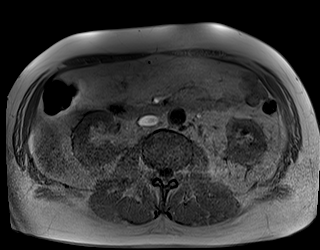
[im 16/80]
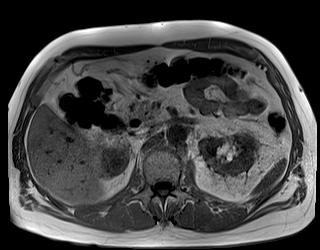
[im 32/80]
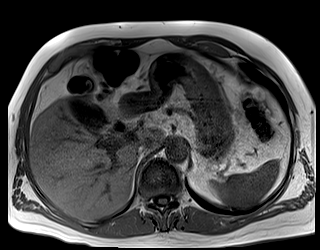
[im 48/80]
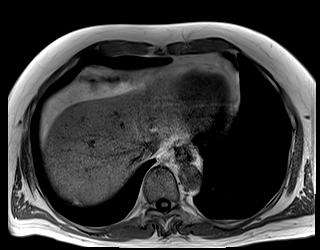
[im 64/80]
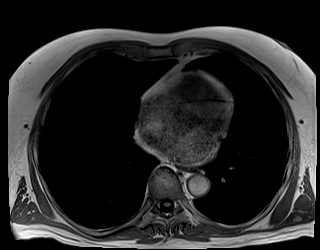
[im 80/80]
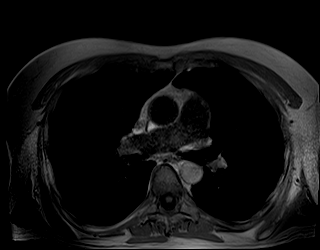

[Series 10: ax in and · axial · 3.0mm · 1.19mm/px · z∈[-118,+118]mm · 6 of 80 slices shown (2 of 2)]
[im 1/80]
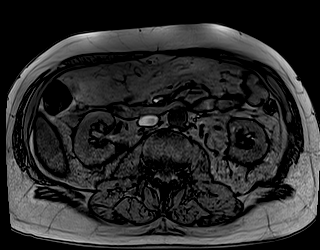
[im 16/80]
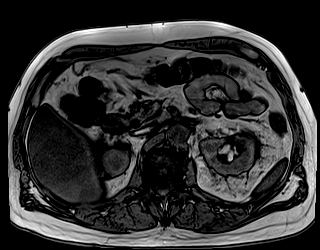
[im 32/80]
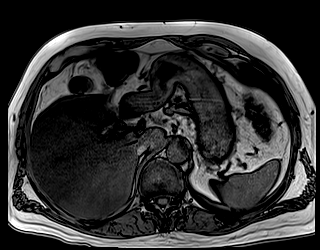
[im 48/80]
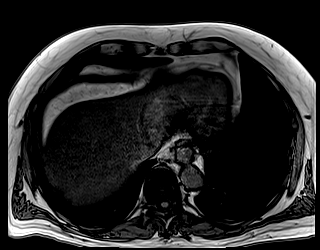
[im 64/80]
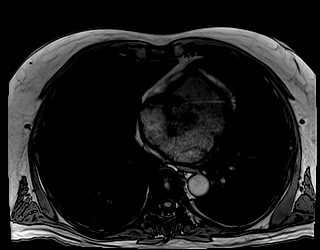
[im 80/80]
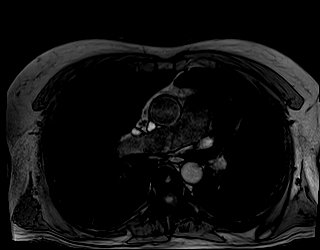

[Series 15: MRCP · coronal · 4.0mm · 1.12mm/px · 1 of 15 slices shown]
[im 1/15]
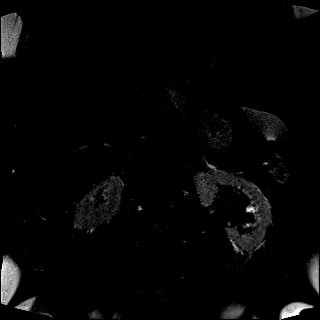

[Series 16: radials · coronal · 50.0mm · 0.78mm/px · 1 of 5 slices shown]
[im 1/5]
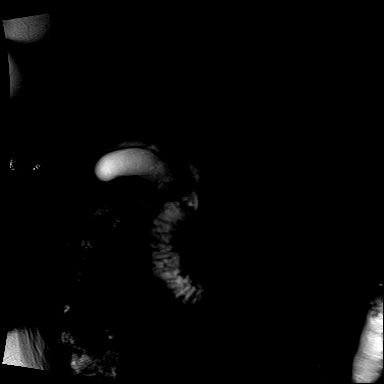

[Series 17: T1 dynamic · axial · non-contrast · 3.0mm · 1.25mm/px · z∈[-120,+117]mm · 6 of 80 slices shown]
[im 1/80]
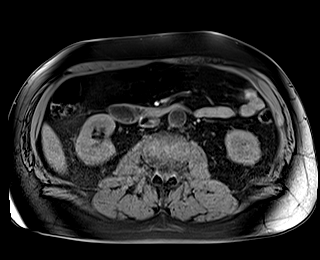
[im 16/80]
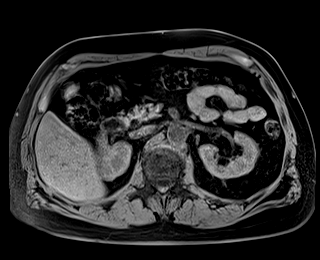
[im 32/80]
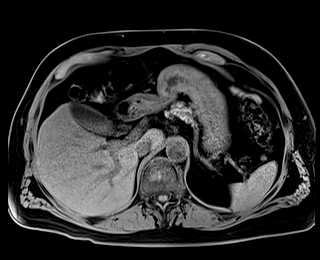
[im 48/80]
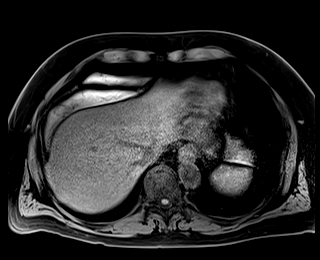
[im 64/80]
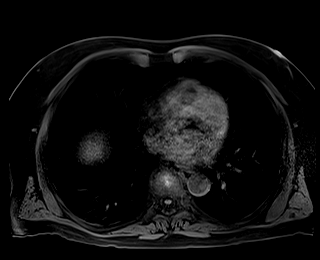
[im 80/80]
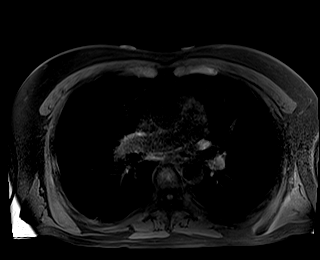

[Series 18: cor haste · coronal · 6.0mm · 1.19mm/px · 2 of 32 slices shown]
[im 1/32]
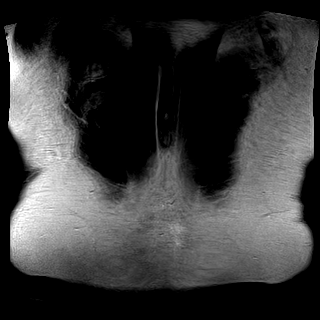
[im 32/32]
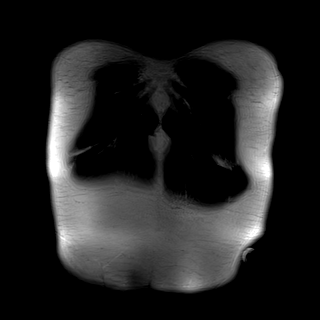

[41 of 48 positions shown; findings below may reference images not displayed]

FINDINGS: Lower chest: No acute findings.

Hepatobiliary:

Diffuse hepatic steatosis. Within the limitations of unenhanced
technique there is no focal liver lesion identified.

Gallbladder wall thickening is identified which measures up to 6 mm,
image [DATE]. No gallstone identified.

Fusiform dilatation of the common bile duct measures up to 1 cm.
Mild central biliary prominence Focal narrowing of the distal CBD
just before the ampulla is identified, image [DATE]. No
choledocholithiasis.

Pancreas: No mass, inflammatory changes, or other parenchymal
abnormality identified.

Spleen:  Within normal limits in size and appearance.

Adrenals/Urinary Tract: No masses identified. No evidence of
hydronephrosis.

Stomach/Bowel: Visualized portions within the abdomen are
unremarkable.

Vascular/Lymphatic:  No aneurysm.  No abdominal adenopathy.

Other:  No free fluid or fluid collections.

Musculoskeletal: No suspicious bone lesions identified. T12
compression fracture is identified with loss of up to 60% of the
vertebral body height. Increased signal within the T12 vertebra
identified compatible with edema.
IMPRESSION: 1. Gallbladder wall thickening. No gallstones identified or
pericholecystic fluid. Cannot exclude acute acalculous
cholecystitis.
2. Fusiform dilatation of the common bile duct measures up to 1 cm.
No signs of choledocholithiasis or obstructing mass.
3. T12 compression fracture with loss of up to 60% of the vertebral
body height. Increased signal within the T12 vertebra compatible
with edema compatible with acute to subacute fracture.
4. Diffuse hepatic steatosis.

## 2022-05-28 ENCOUNTER — Telehealth: Payer: Self-pay | Admitting: Pharmacist

## 2022-05-28 NOTE — Chronic Care Management (AMB) (Signed)
Chronic Care Management Pharmacy Assistant   Name: Matthew Pratt  MRN: [id number] DOB: 1951-08-20  Reason for Encounter: Disease State / General Assessment Call   Conditions to be addressed/monitored: HLD, Anxiety, Depression, GERD, Hypothyroidism, and Osteopenia  Recent office visits:  03/30/2022 [name] MD - Patient was seen for Fatigue, unspecified type and an additional issue. Started Cytomel 25 mcg 1/2 tablet daily. No follow up noted.  Recent consult visits:  05/11/2022 [name] NP (cardiology) - Patient was seen for atypical chest pain and additional issues. Discontinued Cytomel. Follow up in 9-12 months.   03/25/2022 [name] [facility]) - Patient was seen for Subluxation of tendon, wrist or hand and additional issues. No additional issues.   Hospital visits:  Patient was seen at [facility] ED on 05/05/2022 (2 hours) due to Chest pain, unspecified type.    New?Medications Started at [facility] Discharge:?? No medications started Medication Changes at Hospital Discharge: No medication changes Medications Discontinued at Hospital Discharge: No medications discontinued Medications that remain the same after Hospital Discharge:??  -All other medications will remain the same.    Medications: Outpatient Encounter Medications as of 05/28/2022  Medication Sig   aspirin EC 81 MG tablet Take 81 mg by mouth every morning.   b complex vitamins capsule Take 1 capsule by mouth daily at 6 PM.   calcium carbonate (OS-CAL) 600 MG TABS tablet Take 600 mg by mouth daily.   Cholecalciferol (VITAMIN D3) 125 MCG (5000 UT) CAPS Take 5,000 Units by mouth daily at 6 PM.   EPINEPHrine 0.3 mg/0.3 mL IJ SOAJ injection Inject into the muscle as needed.   latanoprost (XALATAN) 0.005 % ophthalmic solution Place 1 drop into both eyes at bedtime.   levothyroxine (SYNTHROID) 125 MCG tablet Take 1 tablet (125 mcg total) by mouth daily.   nitroGLYCERIN (NITROSTAT) 0.4 MG  SL tablet Place 1 tablet (0.4 mg total) under the tongue every 5 (five) minutes as needed for chest pain.   omeprazole (PRILOSEC OTC) 20 MG tablet Take 20 mg by mouth daily at 6 PM.   timolol (TIMOPTIC) 0.5 % ophthalmic solution Place 1 drop into both eyes 2 (two) times daily.   traZODone (DESYREL) 50 MG tablet TAKE 1/2 TO 1 TABLET BY MOUTH AT NIGHT AS NEEDED FOR INSOMNIA   TURMERIC PO Take 1,500 mg by mouth daily.   venlafaxine XR (EFFEXOR-XR) 37.5 MG 24 hr capsule TAKE 3 CAPSULES BY MOUTH EVERY DAY   zinc gluconate 50 MG tablet Take 50 mg by mouth daily at 6 PM.   No facility-administered encounter medications on file as of 05/28/2022.  Fill History: LATANOPROST 0.005% EYE DROPS 04/07/2022 75   NITROGLYCERIN 0.4 MG TABLET SL 02/23/2022 5   OMEPRAZOLE DR 10 MG CAPSULE 08/27/2021 90   TIMOLOL MALEATE  0.5 % SOLN 08/04/2020 90   TRAZODONE 50 MG TABLET 12/28/2021 30   VENLAFAXINE HCL ER 37.5 MG CAP 04/15/2022 90   SYNTHROID 125 MCG TABLET 03/07/2022 [facility] [employer] Review Call   Chart Review:  Have there been any documented new, changed, or discontinued medications since last visit? Yes Cytomel 25 mg 1/2 daily discontinued.  Has there been any documented recent hospitalizations or ED visits since last visit with Clinical Pharmacist? Yes Patient was seen at [facility] ED on 05/05/2022 (2 hours) due to Chest pain, unspecified type.    Adherence Review:  Does the Clinical Pharmacist Assistant have access to adherence rates? Yes  Adherence rates for STAR metric medications: Patient is not currently taking any star metric medications.   Disease State Questions:  Able to connect with Patient? Yes  Did patient have any problems with their health recently? Patient states he is still working with Endocrinology to help balance his thyroid issues. His next appointment with Endocrinology is 07/15/2022.  Have you had any visits with new  specialists or providers since your last visit? Patient denies.   Have you had any new health care problem(s) since your last visit? Patient denies any new health care problems.   Have you run out of any of your medications since you last spoke with clinical pharmacist? Patient denies running out of any of his medications.   Are there any medications you are not taking as prescribed? Patient denies.  Are you having any issues or side effects with your medications? Patient denies any issues with his current medications.   Do you have any other health concerns or questions you want to discuss with your Clinical Pharmacist before your next visit? Patient denies any current concerns. He states he is following a Ketogenic diet and is slowly loosing weight and has noticed improved mental clarity following this way of eating.  Are there any health concerns that you feel we can do a better job addressing? Patient denies.     Care Gaps: AWV - scheduled 04/21/2023 Last BP - 120/72 on 05/11/2022  Star Rating Drugs: None  [place]  Clinical Pharmacist Assistant [phone]

## 2022-07-15 DIAGNOSIS — E039 Hypothyroidism, unspecified: Secondary | ICD-10-CM | POA: Diagnosis not present

## 2022-07-15 DIAGNOSIS — M542 Cervicalgia: Secondary | ICD-10-CM | POA: Diagnosis not present

## 2022-07-16 DIAGNOSIS — E039 Hypothyroidism, unspecified: Secondary | ICD-10-CM | POA: Diagnosis not present

## 2022-07-16 DIAGNOSIS — M542 Cervicalgia: Secondary | ICD-10-CM | POA: Diagnosis not present

## 2022-07-20 DIAGNOSIS — M542 Cervicalgia: Secondary | ICD-10-CM | POA: Diagnosis not present

## 2022-07-20 DIAGNOSIS — E039 Hypothyroidism, unspecified: Secondary | ICD-10-CM | POA: Diagnosis not present

## 2022-08-13 ENCOUNTER — Ambulatory Visit (INDEPENDENT_AMBULATORY_CARE_PROVIDER_SITE_OTHER): Payer: Medicare Other | Admitting: Family Medicine

## 2022-08-13 ENCOUNTER — Encounter: Payer: Self-pay | Admitting: Family Medicine

## 2022-08-13 ENCOUNTER — Ambulatory Visit: Payer: Medicare Other

## 2022-08-13 VITALS — BP 90/60 | HR 58 | Temp 97.8°F | Ht 72.84 in | Wt 190.4 lb

## 2022-08-13 DIAGNOSIS — R490 Dysphonia: Secondary | ICD-10-CM

## 2022-08-13 DIAGNOSIS — Z0000 Encounter for general adult medical examination without abnormal findings: Secondary | ICD-10-CM | POA: Diagnosis not present

## 2022-08-13 DIAGNOSIS — Z23 Encounter for immunization: Secondary | ICD-10-CM

## 2022-08-13 LAB — LIPID PANEL
Cholesterol: 219 mg/dL — ABNORMAL HIGH (ref 0–200)
HDL: 41.4 mg/dL (ref >39.00)
LDL Cholesterol: 160 mg/dL — ABNORMAL HIGH (ref 0–99)
NonHDL: 178.01
Total CHOL/HDL Ratio: 5
Triglycerides: 91 mg/dL (ref 0.0–149.0)
VLDL: 18.2 mg/dL (ref 0.0–40.0)

## 2022-08-13 LAB — CBC WITH DIFFERENTIAL/PLATELET
Basophils Absolute: 0 10*3/uL (ref 0.0–0.1)
Basophils Relative: 0.9 % (ref 0.0–3.0)
Eosinophils Absolute: 0.2 10*3/uL (ref 0.0–0.7)
Eosinophils Relative: 5.1 % — ABNORMAL HIGH (ref 0.0–5.0)
HCT: 41.5 % (ref 39.0–52.0)
Hemoglobin: 14.2 g/dL (ref 13.0–17.0)
Lymphocytes Relative: 34.8 % (ref 12.0–46.0)
Lymphs Abs: 1.3 10*3/uL (ref 0.7–4.0)
MCHC: 34.1 g/dL (ref 30.0–36.0)
MCV: 102.7 fl — ABNORMAL HIGH (ref 78.0–100.0)
Monocytes Absolute: 0.3 10*3/uL (ref 0.1–1.0)
Monocytes Relative: 8.5 % (ref 3.0–12.0)
Neutro Abs: 1.9 10*3/uL (ref 1.4–7.7)
Neutrophils Relative %: 50.7 % (ref 43.0–77.0)
Platelets: 179 10*3/uL (ref 150.0–400.0)
RBC: 4.04 Mil/uL — ABNORMAL LOW (ref 4.22–5.81)
RDW: 13.4 % (ref 11.5–15.5)
WBC: 3.8 10*3/uL — ABNORMAL LOW (ref 4.0–10.5)

## 2022-08-13 LAB — HEMOGLOBIN A1C: Hgb A1c MFr Bld: 5.6 % (ref 4.6–6.5)

## 2022-08-13 LAB — BASIC METABOLIC PANEL
BUN: 14 mg/dL (ref 6–23)
CO2: 29 mEq/L (ref 19–32)
Calcium: 9.6 mg/dL (ref 8.4–10.5)
Chloride: 104 mEq/L (ref 96–112)
Creatinine, Ser: 0.97 mg/dL (ref 0.40–1.50)
GFR: 77.45 mL/min (ref >60.00)
Glucose, Bld: 89 mg/dL (ref 70–99)
Potassium: 4 mEq/L (ref 3.5–5.1)
Sodium: 141 mEq/L (ref 135–145)

## 2022-08-13 LAB — HEPATIC FUNCTION PANEL
ALT: 26 U/L (ref 0–53)
AST: 25 U/L (ref 0–37)
Albumin: 4.2 g/dL (ref 3.5–5.2)
Alkaline Phosphatase: 70 U/L (ref 39–117)
Bilirubin, Direct: 0.1 mg/dL (ref 0.0–0.3)
Total Bilirubin: 0.8 mg/dL (ref 0.2–1.2)
Total Protein: 7 g/dL (ref 6.0–8.3)

## 2022-08-13 NOTE — Progress Notes (Signed)
Established Patient Office Visit  Subjective   Patient ID: Matthew Pratt, male    DOB: 08/19/1951  Age: 71 y.o. MRN: [id number]  Chief Complaint  Patient presents with   Annual Exam    HPI   Jj is seen for physical exam.  He has had some significant fatigue issues this past year and had requested referral to endocrinologist.  Had recent thyroid labs with them.  Remains on Synthroid.  He has lost substantial amount of weight due to his efforts this year.  He has history of fatty liver.  He would like to get his weight down ultimately to 175 pounds.  Has lost already from 211 pounds to 190.  He does have some hoarseness which has been progressive for several months perhaps 8.  No history of smoking.  Chronic GERD and uses low-dose Prilosec for that.  No recent postnasal drip symptoms.  Health maintenance reviewed  -Needs flu vaccine -Colonoscopy due 2029 -Tetanus due 2029 -Shingrix and pneumonia vaccines complete  Family history and social history reviewed with no significant changes.  Past Medical History:  Diagnosis Date   Anxiety    Arthritis    BPH (benign prostatic hypertrophy)    CAD (coronary artery disease)    s/p STEMI 10/15/10 w/ Promus DES placed x1 in the first OM   Colon polyps    Complication of anesthesia    pt. reported 2015 penile inplant when getting some anesthesia had chest pain,they readjusted  the meds. and continued with surgery. Had no further problems di not have to follow up with anyone.   Depression    Depression with anxiety    Dupuytren's contracture of hand    ED (erectile dysfunction)    GERD (gastroesophageal reflux disease)    Glaucoma    HLD (hyperlipidemia)    Hypocontractile bladder    Hypothyroidism    MI (myocardial infarction) ([facility])    denies   OSA on CPAP    Peyronie's disease    Rosacea    Skin cancer    BCC   Past Surgical History:  Procedure Laterality Date   CHOLECYSTECTOMY N/A 02/10/2021   Procedure:  LAPAROSCOPIC CHOLECYSTECTOMY;  Surgeon: Coralie Keens, MD;  Location: [facility];  Service: General;  Laterality: N/A;   COLONOSCOPY WITH ESOPHAGOGASTRODUODENOSCOPY (EGD)     multiple   CORONARY STENT PLACEMENT     INNER EAR SURGERY     LAPAROSCOPIC CHOLECYSTECTOMY Bilateral 02/09/2021   Dr.blackmon   LASIK  02/1998   PENILE PROSTHESIS IMPLANT     TONSILECTOMY, ADENOIDECTOMY, BILATERAL MYRINGOTOMY AND TUBES     TONSILLECTOMY     VASECTOMY     WISDOM TOOTH EXTRACTION      reports that he has never smoked. He has never used smokeless tobacco. He reports that he does not drink alcohol and does not use drugs. family history includes Arthritis in his paternal grandfather; Basal cell carcinoma in his father and sister; COPD in his mother and another family member; Cancer in his father; Colon cancer in his paternal grandfather and paternal grandmother; Heart failure in his maternal grandfather; Liver cancer in his father; Melanoma in his sister; Rheum arthritis in his maternal grandmother; Varicose Veins in his father. Allergies  Allergen Reactions   Bee Venom Hives and Swelling   Crestor [Rosuvastatin] Other (See Comments)    Myalgia.     Iodinated Contrast Media Hives and Itching   Sulfa Antibiotics Rash   Sulfonamide Derivatives Rash    Review of  Systems  Constitutional:  Negative for chills, fever, malaise/fatigue and weight loss.  HENT:  Negative for hearing loss, sinus pain and sore throat.        Chronic hoarseness for several months  Eyes:  Negative for blurred vision and double vision.  Respiratory:  Negative for cough and shortness of breath.   Cardiovascular:  Negative for chest pain, palpitations and leg swelling.  Gastrointestinal:  Negative for abdominal pain, blood in stool, constipation and diarrhea.  Genitourinary:  Negative for dysuria.  Skin:  Negative for rash.  Neurological:  Negative for dizziness, speech change, seizures, loss of consciousness and headaches.   Psychiatric/Behavioral:  Negative for depression.       Objective:     BP 90/60 (BP Location: Left Arm, Patient Position: Sitting, Cuff Size: Normal)   Pulse (!) 58   Temp 97.8 F (36.6 C) (Oral)   Ht 6' 0.84" (1.85 m)   Wt 190 lb 6.4 oz (86.4 kg)   SpO2 98%   BMI 25.23 kg/m    Physical Exam Vitals reviewed.  Constitutional:      General: He is not in acute distress.    Appearance: Normal appearance. He is well-developed.  HENT:     Head: Normocephalic and atraumatic.     Right Ear: External ear normal.     Left Ear: External ear normal.  Eyes:     Conjunctiva/sclera: Conjunctivae normal.     Pupils: Pupils are equal, round, and reactive to light.  Neck:     Thyroid: No thyromegaly.  Cardiovascular:     Rate and Rhythm: Normal rate and regular rhythm.     Heart sounds: Normal heart sounds. No murmur heard. Pulmonary:     Effort: No respiratory distress.     Breath sounds: No wheezing or rales.  Abdominal:     General: Bowel sounds are normal. There is no distension.     Palpations: Abdomen is soft. There is no mass.     Tenderness: There is no abdominal tenderness. There is no guarding or rebound.  Musculoskeletal:     Cervical back: Normal range of motion and neck supple.     Right lower leg: No edema.     Left lower leg: No edema.  Lymphadenopathy:     Cervical: No cervical adenopathy.  Skin:    Findings: No rash.     Comments: He has some well-demarcated brownish scaly lesions on his trunk consistent with seborrheic keratoses.  Neurological:     Mental Status: He is alert and oriented to person, place, and time.     Cranial Nerves: No cranial nerve deficit.     Deep Tendon Reflexes: Reflexes normal.      No results found for any visits on 08/13/22.    The ASCVD Risk score (Arnett DK, et al., 2019) failed to calculate for the following reasons:   The patient has a prior MI or stroke diagnosis    Assessment & Plan:   Problem List Items Addressed  This Visit   None Visit Diagnoses     Physical exam    -  Primary   Relevant Orders   Basic metabolic panel   Lipid panel   CBC with Differential/Platelet   Hepatic function panel   Hemoglobin A1c   Hoarseness       Relevant Orders   Ambulatory referral to ENT     Patient has chronic hoarseness for several months.  He states this has been daily.  Does have a  history of GERD but fairly well controlled on low-dose omeprazole.  Set up ENT referral for consideration for nasolaryngoscopy given duration of symptoms  -Flu vaccine given  -Other vaccines up-to-date  -Continue weight loss/weight maintenance efforts  -Obtain screening labs as above  No follow-ups on file.    [name], MD

## 2022-08-13 NOTE — Addendum Note (Signed)
Addended by: Nilda Riggs on: 08/13/2022 09:35 AM   Modules accepted: Orders

## 2022-08-16 ENCOUNTER — Encounter: Payer: Self-pay | Admitting: Family Medicine

## 2022-08-20 ENCOUNTER — Telehealth: Payer: Self-pay | Admitting: Pharmacist

## 2022-08-20 NOTE — Chronic Care Management (AMB) (Signed)
    Chronic Care Management Pharmacy Assistant   Name: Matthew Pratt  MRN: [id number] DOB: 1951/05/21  08/23/2022 APPOINTMENT REMINDER  [name] was reminded to have all medications, supplements and any blood glucose and blood pressure readings available for review with Jeni Salles, Pharm. D, at his office visit on 08/23/2022 at 10:30.  Care Gaps: AWV - scheduled 04/21/2023 Last BP - 90/60 on 08/13/2022  Star Rating Drug: None  Any gaps in medications fill history? No  Gennie Alma [facility]  Catering manager [phone]

## 2022-08-22 ENCOUNTER — Encounter: Payer: Self-pay | Admitting: Family Medicine

## 2022-08-23 ENCOUNTER — Ambulatory Visit (INDEPENDENT_AMBULATORY_CARE_PROVIDER_SITE_OTHER): Payer: Medicare Other | Admitting: Pharmacist

## 2022-08-23 DIAGNOSIS — G720 Drug-induced myopathy: Secondary | ICD-10-CM

## 2022-08-23 DIAGNOSIS — E785 Hyperlipidemia, unspecified: Secondary | ICD-10-CM

## 2022-08-23 DIAGNOSIS — M8580 Other specified disorders of bone density and structure, unspecified site: Secondary | ICD-10-CM

## 2022-08-23 DIAGNOSIS — F339 Major depressive disorder, recurrent, unspecified: Secondary | ICD-10-CM

## 2022-08-23 NOTE — Progress Notes (Signed)
Chronic Care Management Pharmacy Note  08/23/2022 Name:  Matthew Pratt MRN:  [id number] DOB:  May 24, 1951  Summary: Pt still has not heard about ENT referral LDL not at goal < 70   Recommendations/Changes made from today's visit: -Provided phone number for ENT referral office -Recommend Zetia 10 mg daily -Recommend repeat DEXA scan   Plan: Tolerance assessment in 3-4 weeks and lipid panel in 6-8 weeks Follow up in 6 months   Subjective: Matthew Pratt is an 71 y.o. year old male who is a primary patient of Burchette, Alinda Sierras, MD.  The CCM team was consulted for assistance with disease management and care coordination needs.    Engaged with patient face to face for follow up visit in response to provider referral for pharmacy case management and/or care coordination services.   Consent to Services:  The patient was given information about Chronic Care Management services, agreed to services, and gave verbal consent prior to initiation of services.  Please see initial visit note for detailed documentation.   Patient Care Team: [name], MD as PCP - General (Family Medicine) Elouise Munroe, MD as PCP - Cardiology (Cardiology) Viona Gilmore, [facility] as Pharmacist (Pharmacist)  Recent office visits: 08/13/22 [name], MD: Patient presented for annual exam. Referred to ENT for hoarseness.  03/30/2022 [name] MD - Patient was seen for Fatigue, unspecified type and an additional issue. Started Cytomel 25 mcg 1/2 tablet daily. No follow up noted.  Recent consult visits: 07/15/22 [name], MD (endocrinology): Patient presented for acquired hypothyroidism. Plan for testosterone labs. Follow up in 4 months.  05/11/2022 [name] NP (cardiology) - Patient was seen for atypical chest pain and additional issues. Discontinued Cytomel. Follow up in 9-12 months.   03/25/2022 [name] [facility]) - Patient was seen for Subluxation of tendon, wrist  or hand and additional issues. No additional issues.   02/16/22 [name], NP (cardiology): Patient presented for CAD follow up. No medication changes.  Hospital visits: Patient was seen at [facility] ED on 05/05/2022 (2 hours) due to Chest pain, unspecified type.    New?Medications Started at [facility] Discharge:?? No medications started Medication Changes at Hospital Discharge: No medication changes Medications Discontinued at Hospital Discharge: No medications discontinued Medications that remain the same after Hospital Discharge:??  -All other medications will remain the same.    Objective:  Lab Results  Component Value Date   CREATININE 0.97 08/13/2022   BUN 14 08/13/2022   GFR 77.45 08/13/2022   GFRNONAA >60 05/05/2022   GFRAA >60 07/01/2020   NA 141 08/13/2022   K 4.0 08/13/2022   CALCIUM 9.6 08/13/2022   CO2 29 08/13/2022    Lab Results  Component Value Date/Time   HGBA1C 5.6 08/13/2022 09:22 AM   HGBA1C 5.4 09/30/2021 12:39 PM   HGBA1C 5.4 04/29/2021 11:36 AM   HGBA1C 5.6 06/29/2020 11:55 AM   GFR 77.45 08/13/2022 09:22 AM   GFR 62.80 11/23/2021 03:01 PM    Last diabetic Eye exam: No results found for: "HMDIABEYEEXA"  Last diabetic Foot exam: No results found for: "HMDIABFOOTEX"   Lab Results  Component Value Date   CHOL 219 (H) 08/13/2022   HDL 41.40 08/13/2022   LDLCALC 160 (H) 08/13/2022   TRIG 91.0 08/13/2022   CHOLHDL 5 08/13/2022       Latest Ref Rng & Units 08/13/2022    9:22 AM 11/23/2021    3:01 PM 03/25/2021    9:54 AM  Hepatic  Function  Total Protein 6.0 - 8.3 g/dL 7.0  7.1  7.0   Albumin 3.5 - 5.2 g/dL 4.2  4.3  4.6   AST 0 - 37 U/L _0 ALT 0 - 53 U/L 26  32  33   Alk Phosphatase 39 - 117 U/L 70  64  79   Total Bilirubin 0.2 - 1.2 mg/dL 0.8  0.7  1.0   Bilirubin, Direct 0.0 - 0.3 mg/dL 0.1   0.26     Lab Results  Component Value Date/Time   TSH 3.82 02/22/2022 10:55 AM   TSH 0.29 (L) 12/15/2021 10:08 AM    FREET4 1.25 12/15/2021 10:08 AM       Latest Ref Rng & Units 08/13/2022    9:22 AM 05/05/2022   12:38 PM 12/15/2021   10:08 AM  CBC  WBC 4.0 - 10.5 K/uL 3.8  5.6  3.9   Hemoglobin 13.0 - 17.0 g/dL 14.2  15.1  15.0   Hematocrit 39.0 - 52.0 % 41.5  43.7  45.4   Platelets 150.0 - 400.0 K/uL 179.0  190  199.0     Lab Results  Component Value Date/Time   VD25OH 33.61 02/19/2021 09:24 AM   VD25OH 47.44 03/29/2019 12:43 PM     Clinical ASCVD: Yes  The ASCVD Risk score (Arnett DK, et al., 2019) failed to calculate for the following reasons:   The patient has a prior MI or stroke diagnosis       08/13/2022    9:06 AM 04/19/2022    1:47 PM 03/30/2022    3:02 PM  Depression screen PHQ 2/9  Decreased Interest _1 Down, Depressed, Hopeless 0 1 1  PHQ - 2 Score _2 Altered sleeping _3 Tired, decreased energy _4 Change in appetite 0 0 0  Feeling bad or failure about yourself  0 0 0  Trouble concentrating 0 1 0  Moving slowly or fidgety/restless 1 0 0  Suicidal thoughts 0 0 0  PHQ-9 Score _5 Difficult doing work/chores Not difficult at all Somewhat difficult Somewhat difficult      Social History   Tobacco Use  Smoking Status Never  Smokeless Tobacco Never   BP Readings from Last 3 Encounters:  08/13/22 90/60  05/11/22 120/72  05/05/22 107/78   Pulse Readings from Last 3 Encounters:  08/13/22 (!) 58  05/11/22 (!) 55  05/05/22 (!) 47   Wt Readings from Last 3 Encounters:  08/13/22 190 lb 6.4 oz (86.4 kg)  05/11/22 218 lb 6.4 oz (99.1 kg)  05/05/22 215 lb (97.5 kg)    Assessment/Interventions: Review of patient past medical history, allergies, medications, health status, including review of consultants reports, laboratory and other test data, was performed as part of comprehensive evaluation and provision of chronic care management services.   SDOH:  (Social Determinants of Health) assessments and interventions performed: Yes  SDOH Interventions     Flowsheet Row Chronic Care Management from 08/23/2022 in [place] at [place] from 04/19/2022 in [place] at [place] from 04/17/2021 in [place] at [place] Management from 11/21/2020 in [place] at [place] Interventions -- Intervention Not Indicated Intervention Not Indicated --  Housing Interventions -- Intervention Not Indicated Intervention Not Indicated --  Transportation Interventions Intervention Not Indicated Intervention Not Indicated -- Intervention Not  Indicated  Depression Interventions/Treatment  -- Currently on Treatment  [Followed by PCP] -- --  Financial Strain Interventions -- Intervention Not Indicated -- Intervention Not Indicated  Physical Activity Interventions -- Intervention Not Indicated Intervention Not Indicated --  Stress Interventions -- Patient Refused Intervention Not Indicated --  Social Connections Interventions -- Intervention Not Indicated Intervention Not Indicated --       CCM Care Plan  Allergies  Allergen Reactions   Bee Venom Hives and Swelling   Crestor [Rosuvastatin] Other (See Comments)    Myalgia.     Iodinated Contrast Media Hives and Itching   Sulfa Antibiotics Rash   Sulfonamide Derivatives Rash    Medications Reviewed Today     Reviewed by Viona Gilmore, [facility] (Pharmacist) on 08/23/22 at 1138  Med List Status: <None>   Medication Order Taking? Sig Documenting Provider Last Dose Status Informant  acetaminophen (TYLENOL) 500 MG tablet [id number] Yes Take 500 mg by mouth every 6 (six) hours as needed. [provider] Taking Active   aspirin EC 81 MG tablet [id number] Yes Take 81 mg by mouth every morning. [provider] Taking Active Self           Med Note Tamala Julian, JEFFREY W   Fri Jun 27, 2020 11:32 PM)    b complex vitamins capsule [id number] Yes Take 1 capsule by mouth daily at 6  PM. [provider] Taking Active Self  calcium carbonate (OS-CAL) 600 MG TABS tablet [id number] Yes Take 600 mg by mouth daily with breakfast. With 680 units of vitamin D [provider] Taking Active   Cholecalciferol (VITAMIN D3) 50 MCG (2000 UT) TABS [id number] Yes Take 2,000 Units by mouth daily at 6 PM. [provider] Taking Active Self  Digestive Enzymes (SUPER ENZYMES PO) [id number] Yes Take 1 capsule by mouth daily. [provider] Taking Active   EPINEPHrine 0.3 mg/0.3 mL IJ SOAJ injection [id number]  Inject into the muscle as needed. [provider]  Active   latanoprost (XALATAN) 0.005 % ophthalmic solution [id number] Yes Place 1 drop into both eyes at bedtime. [provider] Taking Active Self  levothyroxine (SYNTHROID) 125 MCG tablet [id number] Yes Take 1 tablet (125 mcg total) by mouth daily.  Patient taking differently: Take 125 mcg by mouth daily. Taking 125 mcg except Friday 1.5 tablets   [name], MD Taking Active   nitroGLYCERIN (NITROSTAT) 0.4 MG SL tablet [id number]  Place 1 tablet (0.4 mg total) under the tongue every 5 (five) minutes as needed for chest pain. Deberah Pelton, NP  Expired 05/24/22 2359   omeprazole (PRILOSEC OTC) 20 MG tablet [id number] Yes Take 20 mg by mouth daily at 6 PM. [provider] Taking Active Self  Selenium 100 MCG TABS [id number] Yes Take 100 mcg by mouth daily. [provider] Taking Active   senna (SENOKOT) 8.6 MG tablet [id number] Yes Take 1 tablet by mouth as needed for constipation. [provider] Taking Active   timolol (TIMOPTIC) 0.5 % ophthalmic solution [id number] Yes Place 1 drop into both eyes 2 (two) times daily. [provider] Taking Active Self  traZODone (DESYREL) 50 MG tablet [id number] Yes TAKE 1/2 TO 1 TABLET BY MOUTH AT NIGHT AS NEEDED FOR INSOMNIA Burchette, Alinda Sierras, MD Taking Active   TURMERIC PO [id number] Yes Take 1,500 mg by mouth  daily. [provider] Taking Active   venlafaxine XR (EFFEXOR-XR) 37.5 MG 24 hr capsule [id number] Yes TAKE 3 CAPSULES BY MOUTH EVERY DAY  [name], MD Taking Active   zinc gluconate 50 MG tablet [id number] Yes Take 50 mg by mouth daily at 6 PM. 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 10/28/2018   Irritable bowel syndrome with diarrhea 10/28/2018   Hypothyroid 07/26/2018   GERD (gastroesophageal reflux disease) 07/26/2018   Barrett's esophagus 07/26/2018   Rosacea 07/26/2018   Medicare annual wellness visit, initial 02/24/2018   Dupuytren's contracture of hand 06/23/2017   H/O acute myocardial infarction 06/16/2017   Fatigue 06/16/2017   Benign prostatic hyperplasia with urinary hesitancy 06/16/2017   OSA (obstructive sleep apnea) 12/03/2016   Depression, recurrent ([place]) 12/03/2016   Heartburn 10/15/2016   Moderate episode of recurrent major depressive disorder ([place]) 07/22/2016   Atypical chest pain 12/26/2015   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   Erectile dysfunction due to arterial insufficiency 03/26/2015   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   Hyperlipidemia 10/26/2010   DEPRESSION/ANXIETY 10/26/2010   GLAUCOMA 10/26/2010   MYOCARDIAL INFARCTION, ACUTE, INFEROLATERAL 10/26/2010   CAD, NATIVE VESSEL 10/26/2010    SINUS BRADYCARDIA 10/26/2010   Glaucoma 10/26/2010    Immunization History  Administered Date(s) Administered   Fluad Quad(high Dose 65+) 08/10/2019, 08/13/2022   Influenza, High Dose Seasonal PF 09/18/2020, 08/18/2021   Influenza-Unspecified 09/13/2016, 10/04/2017, 07/28/2018, 09/18/2020   PFIZER Comirnaty(Gray Top)Covid-19 Tri-Sucrose Vaccine 04/21/2021   PFIZER(Purple Top)SARS-COV-2 Vaccination 01/27/2020, 02/20/2020, 09/18/2020, 08/18/2021   PNEUMOCOCCAL CONJUGATE-20 07/22/2022   Pfizer Covid-19 Vaccine Bivalent Booster 91yr & up 08/18/2021   Pneumococcal Conjugate-13 06/16/2017, 06/16/2017   Pneumococcal Polysaccharide-23 08/10/2019   Tdap 08/21/2018   Zoster Recombinat (Shingrix) 11/13/2019, 04/09/2020   Patient reports he has had some issues with getting down the timing of his medications since his virus and GI symptoms had messed things up. He was unsure of the optimal timing for his medications to avoid GI symptoms and to improve sleep. Patient had moved everything to 6pm when he was having issues with nausea/GI symptoms but he feels like the venlafaxine is keeping him up at night.  Patient also reports he is getting back into exercising and has a gym membership. He plans to start lifting weights (even if they are small) to regain muscle.  Patient is feeling a lot better in general lately. [place] and is really looking forward to this.  Patient has concerns related to his voice and needing to clear his throat often especially when giving presentations. He thinks it could be related to mucus production as water seemed to help with his last presentation. Recommended Mucinex as well to help break up/thin out the mucus.  Conditions to be addressed/monitored:  Hyperlipidemia, Coronary Artery Disease, GERD, Hypothyroidism, Depression, Osteopenia and glaucoma  Conditions addressed at this visit: GERD, hypothyroidism  Care  Plan : [place]  Updates made by Viona Gilmore, [place] since 08/23/2022 12:00 AM     Problem: Problem: Hyperlipidemia, Coronary Artery Disease, GERD, Hypothyroidism, Depression, Osteopenia and glaucoma      Long-Range Goal: Patient-Specific Goal   Start Date: 02/19/2021  Expected End Date: 02/19/2022  Recent Progress: On track  Priority: High  Note:   Current Barriers:  Unable to independently monitor therapeutic efficacy Unable to achieve control of cholesterol   Pharmacist Clinical Goal(s):  Patient will verbalize ability to afford treatment regimen achieve adherence to monitoring guidelines and medication adherence to achieve therapeutic efficacy achieve control of cholesterol as evidenced by next cholesterol check  through collaboration with PharmD and provider.   Interventions: 1:1 collaboration with [name], MD regarding development and update of comprehensive plan of care as evidenced by provider attestation and co-signature Inter-disciplinary care team collaboration (see longitudinal plan of care) Comprehensive medication review performed; medication list updated in electronic medical record  Hyperlipidemia: (LDL goal < 70) -Uncontrolled -Current treatment: No medications -Medications previously tried: statins (LFT elevation), Repatha (side effects)  -Current dietary patterns: patient has cut out a lot of fat -Current exercise habits: no structured exercise -Educated on Cholesterol goals;  Importance of limiting foods high in cholesterol; Exercise goal of 150 minutes per week; -Recommended to continue current medication Recommended Zetia 10 mg daily.  CAD/History of MI (Goal: prevent future heart attacks and strokes) -Controlled -Current treatment  Aspirin 81 mg 1 tablet daily - Appropriate, Effective, Safe, Accessible -Medications previously tried: none  -Counseled on monitoring for signs of bleeding such as unexplained and excessive  bleeding from a cut or injury, easy or excessive bruising, blood in urine or stools, and nosebleeds without a known cause  Hypothyroidism (Goal: TSH 0.35-4.5) -Controlled -Current treatment  Synthroid 125 mcg 1 tablet daily and 1.5 tablets in Fridays - Appropriate, Effective, Safe, Accessible -Medications previously tried: none  -Recommended to continue current medication  GERD/Barrett's esophagus (Goal: minimize symptoms) -Controlled -Current treatment  Omeprazole 20 mg 1 capsule daily - Appropriate, Effective, Safe, Accessible -Medications previously tried: none  -Recommended to continue current medication Recommended administering in the evening on an empty stomach.  Rosacea (Goal: minimize symptoms) -Controlled -Current treatment  No medications -Medications previously tried: doxycycline, metronidazole -Recommended to continue current medication  Depression (Goal: minimize symptoms) -Controlled -Current treatment: Venlafaxine XR 37.5 mg 4 capsules daily - Appropriate, Effective, Safe, Accessible -Medications previously tried/failed: SSRIs (bruising) -PHQ9: 0 -Educated on Benefits of medication for symptom control Benefits of cognitive-behavioral therapy with or without medication -Recommended to continue current medication -Recommended follow up with behavioral health   Osteopenia (Goal prevent fractures) -Controlled -Last DEXA Scan: 04/16/20              T-Score femoral neck: RFN -1.2, LFN -1.3             T-Score total hip: n/a             T-Score lumbar spine: -1.3             T-Score forearm radius: n/a             10-year probability of major osteoporotic fracture: 9.9%             10-year probability of hip fracture: 3.1% -Patient is not a candidate for pharmacologic treatment -Current treatment  Vitamin D 2000 units 1 tablet daily - Appropriate, Effective, Safe, Accessible Calcium 600 mg 1 tablet daily (with 680 units vitamin D)- Appropriate, Effective, Safe,  Accessible -Medications  previously tried: none -Recommend [phone] units of vitamin D daily. Recommend 1200 mg of calcium daily from dietary and supplemental sources. Recommend weight-bearing and muscle strengthening exercises for building and maintaining bone density. -Recommended to continue current medication Recommended repeat DEXA scan.  Glaucoma (Goal: lower intraocular pressure) -Controlled -Current treatment  Latanoprost 0.005% 1 drop in both eyes at bedtime - Appropriate, Effective, Safe, Accessible Timolol 0.5% 1 drop in both eyes twice daily - Appropriate, Effective, Safe, Accessible -Medications previously tried: none  -Recommended to continue current medication  Insomnia (Goal: improve quality and quantity of sleep) -Controlled -Current treatment  Trazodone 50 mg 1 tablet as needed for sleep - Appropriate, Effective, Safe, Accessible -Medications previously tried: none  -Recommended to continue current medication   Health Maintenance -Vaccine gaps: none -Current therapy:  Vitamin B complex daily Turmeric 1500 mg daily Zinc 50 mg 1 tablet daily Selenium 100 mcg 1 tablet daily Senokot as needed -Educated on Cost vs benefit of each product must be carefully weighed by individual consumer -Patient is satisfied with current therapy and denies issues -Recommended to continue current medication  Patient Goals/Self-Care Activities Patient will:  - take medications as prescribed target a minimum of 150 minutes of moderate intensity exercise weekly  Follow Up Plan: The care management team will reach out to the patient again over the next 7 days.        Medication Assistance: None required.  Patient affirms current coverage meets needs.  Compliance/Adherence/Medication fill history: Care Gaps: COVID booster Last BP - 90/60 on 08/13/2022   Star-Rating Drugs: None  Patient's preferred pharmacy is:  [id number]- [place], [place] - [address] [id number]: [phone]: [phone] [place] [facility]Phone: [phone]: [phone]  Uses pill box? Yes - two pill boxes - 1 for AM and 1 for PM Pt endorses 90% compliance - patient reports he has been more compliant with his morning medications  We discussed: Current pharmacy is preferred with insurance plan and patient is satisfied with pharmacy services Patient decided to: Continue current medication management strategy  Care Plan and Follow Up Patient Decision:  Patient agrees to Care Plan and Follow-up.  Plan: The care management team will reach out to the patient again over the next 7 days.  MJeni Salles PharmD BKaiser Fnd Hosp - AnaheimClinical Pharmacist [place]at [place]

## 2022-08-23 NOTE — Patient Instructions (Signed)
Hi Matthew Pratt,  It was great to see you again! I enjoy our touchbases.  From what I am finding about ox bile it can interfere with the entire thyroid hormone process as in it may not effect TSH but can affect T3 and T4 levels so it might be a better idea to avoid. As far as the post gallbladder removal goes, some sources say that the bile salts are not recommended as long as your liver is still intact and can sometimes cause diarrhea or stomach cramping/bloating. I couldn't find much about the appropriate dose so I am not sure if what you are on will cause an issue or not.  Please reach out to me if you have any questions or need anything before I reach back out!  Best, [name]  Jeni Salles, PharmD, [facility] [place]   Visit Information   Goals Addressed   None    Patient Care Plan: [facility]CM Pharmacy Care Plan     Problem Identified: Problem: Hyperlipidemia, Coronary Artery Disease, GERD, Hypothyroidism, Depression, Osteopenia and glaucoma      Long-Range Goal: Patient-Specific Goal   Start Date: 02/19/2021  Expected End Date: 02/19/2022  Recent Progress: On track  Priority: High  Note:   Current Barriers:  Unable to independently monitor therapeutic efficacy Unable to achieve control of cholesterol   Pharmacist Clinical Goal(s):  Patient will verbalize ability to afford treatment regimen achieve adherence to monitoring guidelines and medication adherence to achieve therapeutic efficacy achieve control of cholesterol as evidenced by next cholesterol check  through collaboration with PharmD and provider.   Interventions: 1:1 collaboration with [name], MD regarding development and update of comprehensive plan of care as evidenced by provider attestation and co-signature Inter-disciplinary care team collaboration (see longitudinal plan of care) Comprehensive medication review performed; medication list updated in  electronic medical record  Hyperlipidemia: (LDL goal < 70) -Uncontrolled -Current treatment: No medications -Medications previously tried: statins (LFT elevation), Repatha (side effects)  -Current dietary patterns: patient has cut out a lot of fat -Current exercise habits: no structured exercise -Educated on Cholesterol goals;  Importance of limiting foods high in cholesterol; Exercise goal of 150 minutes per week; -Recommended to continue current medication Recommended Zetia 10 mg daily.  CAD/History of MI (Goal: prevent future heart attacks and strokes) -Controlled -Current treatment  Aspirin 81 mg 1 tablet daily - Appropriate, Effective, Safe, Accessible -Medications previously tried: none  -Counseled on monitoring for signs of bleeding such as unexplained and excessive bleeding from a cut or injury, easy or excessive bruising, blood in urine or stools, and nosebleeds without a known cause  Hypothyroidism (Goal: TSH 0.35-4.5) -Controlled -Current treatment  Synthroid 125 mcg 1 tablet daily and 1.5 tablets in Fridays - Appropriate, Effective, Safe, Accessible -Medications previously tried: none  -Recommended to continue current medication  GERD/Barrett's esophagus (Goal: minimize symptoms) -Controlled -Current treatment  Omeprazole 20 mg 1 capsule daily - Appropriate, Effective, Safe, Accessible -Medications previously tried: none  -Recommended to continue current medication Recommended administering in the evening on an empty stomach.  Rosacea (Goal: minimize symptoms) -Controlled -Current treatment  No medications -Medications previously tried: doxycycline, metronidazole -Recommended to continue current medication  Depression (Goal: minimize symptoms) -Controlled -Current treatment: Venlafaxine XR 37.5 mg 4 capsules daily - Appropriate, Effective, Safe, Accessible -Medications previously tried/failed: SSRIs (bruising) -PHQ9: 0 -Educated on Benefits of medication  for symptom control Benefits of cognitive-behavioral therapy with or without medication -Recommended to continue current medication -Recommended follow up  with behavioral health   Osteopenia (Goal prevent fractures) -Controlled -Last DEXA Scan: 04/16/20              T-Score femoral neck: RFN -1.2, LFN -1.3             T-Score total hip: n/a             T-Score lumbar spine: -1.3             T-Score forearm radius: n/a             10-year probability of major osteoporotic fracture: 9.9%             10-year probability of hip fracture: 3.1% -Patient is not a candidate for pharmacologic treatment -Current treatment  Vitamin D 2000 units 1 tablet daily - Appropriate, Effective, Safe, Accessible Calcium 600 mg 1 tablet daily (with 680 units vitamin D)- Appropriate, Effective, Safe, Accessible -Medications previously tried: none -Recommend [phone] units of vitamin D daily. Recommend 1200 mg of calcium daily from dietary and supplemental sources. Recommend weight-bearing and muscle strengthening exercises for building and maintaining bone density. -Recommended to continue current medication Recommended repeat DEXA scan.  Glaucoma (Goal: lower intraocular pressure) -Controlled -Current treatment  Latanoprost 0.005% 1 drop in both eyes at bedtime - Appropriate, Effective, Safe, Accessible Timolol 0.5% 1 drop in both eyes twice daily - Appropriate, Effective, Safe, Accessible -Medications previously tried: none  -Recommended to continue current medication  Insomnia (Goal: improve quality and quantity of sleep) -Controlled -Current treatment  Trazodone 50 mg 1 tablet as needed for sleep - Appropriate, Effective, Safe, Accessible -Medications previously tried: none  -Recommended to continue current medication   Health Maintenance -Vaccine gaps: none -Current therapy:  Vitamin B complex daily Turmeric 1500 mg daily Zinc 50 mg 1 tablet daily Selenium 100 mcg 1 tablet daily Senokot as  needed -Educated on Cost vs benefit of each product must be carefully weighed by individual consumer -Patient is satisfied with current therapy and denies issues -Recommended to continue current medication  Patient Goals/Self-Care Activities Patient will:  - take medications as prescribed target a minimum of 150 minutes of moderate intensity exercise weekly  Follow Up Plan: The care management team will reach out to the patient again over the next 7 days.        Patient verbalizes understanding of instructions and care plan provided today and agrees to view in [place]. Active MyChart status and patient understanding of how to access instructions and care plan via MyChart confirmed with patient.    The pharmacy team will reach out to the patient again over the next 7 days.   Matthew Pratt, [facility]

## 2022-08-24 ENCOUNTER — Telehealth: Payer: Self-pay | Admitting: Family Medicine

## 2022-08-24 NOTE — Telephone Encounter (Signed)
Pt call and stated he is returning your call and will wait for a call back. 

## 2022-08-25 ENCOUNTER — Telehealth: Payer: Self-pay | Admitting: Pharmacist

## 2022-08-25 ENCOUNTER — Encounter: Payer: Self-pay | Admitting: Family Medicine

## 2022-08-25 MED ORDER — EZETIMIBE 10 MG PO TABS: 10.0000 mg | ORAL_TABLET | Freq: Every day | ORAL | 3 refills | Status: DC

## 2022-08-25 NOTE — Telephone Encounter (Signed)
I have not attempted to contact patient. Please disregard

## 2022-08-25 NOTE — Telephone Encounter (Signed)
Called patient to make him aware that his PCP agreed with the plan for cholesterol and sent in Zetia 10 mg daily. Patient plans to start after he returns from his trip October 8th. Will follow up to assess tolerance a few weeks after that and schedule lipid panel if patient is tolerating.

## 2022-08-25 NOTE — Addendum Note (Signed)
Addended by: [name] on: 08/25/2022 12:28 PM   Modules accepted: Orders

## 2022-09-04 DIAGNOSIS — M810 Age-related osteoporosis without current pathological fracture: Secondary | ICD-10-CM

## 2022-09-04 DIAGNOSIS — H409 Unspecified glaucoma: Secondary | ICD-10-CM | POA: Diagnosis not present

## 2022-09-04 DIAGNOSIS — E039 Hypothyroidism, unspecified: Secondary | ICD-10-CM

## 2022-09-04 DIAGNOSIS — F32A Depression, unspecified: Secondary | ICD-10-CM | POA: Diagnosis not present

## 2022-09-04 DIAGNOSIS — E785 Hyperlipidemia, unspecified: Secondary | ICD-10-CM

## 2022-09-04 DIAGNOSIS — I2510 Atherosclerotic heart disease of native coronary artery without angina pectoris: Secondary | ICD-10-CM

## 2022-09-14 ENCOUNTER — Encounter: Payer: Self-pay | Admitting: Family Medicine

## 2022-09-14 NOTE — Telephone Encounter (Signed)
Noted  Wrigley Plasencia W Khristina Janota MD [facility] [place]  

## 2022-10-04 ENCOUNTER — Telehealth: Payer: Self-pay | Admitting: Pharmacist

## 2022-10-04 NOTE — Chronic Care Management (AMB) (Signed)
    Chronic Care Management Pharmacy Assistant   Name: Matthew Pratt  MRN: [id number] DOB: 1951/09/07  Reason for Encounter: Follow up Zetia   Spoke with patient, he discontinued Zetia on 09/14/2022 due to joint pains. Patient states his joint pain has cleared  since discontinuing Zetia.  Patient agrees, no lab appointment was made due to no longer being on Zetia.   [place] Pharmacist Assistant [phone]

## 2022-10-05 NOTE — Addendum Note (Signed)
Addended by: Viona Gilmore on: 10/05/2022 08:43 AM   Modules accepted: Orders

## 2022-10-07 DIAGNOSIS — H401131 Primary open-angle glaucoma, bilateral, mild stage: Secondary | ICD-10-CM | POA: Diagnosis not present

## 2022-10-14 DIAGNOSIS — K219 Gastro-esophageal reflux disease without esophagitis: Secondary | ICD-10-CM | POA: Diagnosis not present

## 2022-10-16 ENCOUNTER — Other Ambulatory Visit: Payer: Self-pay | Admitting: Family Medicine

## 2022-11-08 ENCOUNTER — Telehealth: Payer: Self-pay | Admitting: Pharmacist

## 2022-11-08 NOTE — Chronic Care Management (AMB) (Signed)
Chronic Care Management Pharmacy Assistant   Name: Matthew Pratt  MRN: [id number] DOB: 11/10/1951  Reason for Encounter: Disease State / General Assessment Call   Conditions to be addressed/monitored: GERD, Hypothyroidism, and hyperlipidemia, CAD  Recent office visits:  None  Recent consult visits:  10/14/2022 Izora Gala MD (ENT) - Patient was seen for laryngopharyngeal reflux. No medication changes. No follow up noted.   Hospital visits:  None  Medications: Outpatient Encounter Medications as of 11/08/2022  Medication Sig   acetaminophen (TYLENOL) 500 MG tablet Take 500 mg by mouth every 6 (six) hours as needed.   aspirin EC 81 MG tablet Take 81 mg by mouth every morning.   b complex vitamins capsule Take 1 capsule by mouth daily at 6 PM.   calcium carbonate (OS-CAL) 600 MG TABS tablet Take 600 mg by mouth daily with breakfast. With 680 units of vitamin D   Cholecalciferol (VITAMIN D3) 50 MCG (2000 UT) TABS Take 2,000 Units by mouth daily at 6 PM.   Digestive Enzymes (SUPER ENZYMES PO) Take 1 capsule by mouth daily.   EPINEPHrine 0.3 mg/0.3 mL IJ SOAJ injection Inject into the muscle as needed.   latanoprost (XALATAN) 0.005 % ophthalmic solution Place 1 drop into both eyes at bedtime.   levothyroxine (SYNTHROID) 125 MCG tablet Take 1 tablet (125 mcg total) by mouth daily. (Patient taking differently: Take 125 mcg by mouth daily. Taking 125 mcg except Friday 1.5 tablets)   nitroGLYCERIN (NITROSTAT) 0.4 MG SL tablet Place 1 tablet (0.4 mg total) under the tongue every 5 (five) minutes as needed for chest pain.   omeprazole (PRILOSEC OTC) 20 MG tablet Take 20 mg by mouth daily at 6 PM.   Selenium 100 MCG TABS Take 100 mcg by mouth daily.   senna (SENOKOT) 8.6 MG tablet Take 1 tablet by mouth as needed for constipation.   timolol (TIMOPTIC) 0.5 % ophthalmic solution Place 1 drop into both eyes 2 (two) times daily.   traZODone (DESYREL) 50 MG tablet TAKE 1/2 TO 1 TABLET BY  MOUTH AT NIGHT AS NEEDED FOR INSOMNIA   TURMERIC PO Take 1,500 mg by mouth daily.   venlafaxine XR (EFFEXOR-XR) 37.5 MG 24 hr capsule TAKE 3 CAPSULES BY MOUTH EVERY DAY   zinc gluconate 50 MG tablet Take 50 mg by mouth daily at 6 PM.   No facility-administered encounter medications on file as of 11/08/2022.  Fill History:   Dispensed Days Supply Quantity Provider Pharmacy  EPINEPHRINE 0.3 MG AUTO-INJECT 09/23/2021 30 2 each      Dispensed Days Supply Quantity Provider Pharmacy  LATANOPROST 0.005% EYE DROPS 04/07/2022 75 7.5 mL      Dispensed Days Supply Quantity Provider Pharmacy  SYNTHROID 125 MCG TABLET 08/26/2022 90 90 each      Dispensed Days Supply Quantity Provider Pharmacy  NITROGLYCERIN 0.4 MG TABLET SL 02/23/2022 5 25 each      Dispensed Days Supply Quantity Provider Pharmacy  OMEPRAZOLE DR 10 MG CAPSULE 08/27/2021 90 90 each      Dispensed Days Supply Quantity Provider Pharmacy  TIMOLOL MALEATE 0.5% EYE DROPS 08/23/2022 90 30 mL      Dispensed Days Supply Quantity Provider Pharmacy  TRAZODONE 50 MG TABLET 12/28/2021 30 90 each      Dispensed Days Supply Quantity Provider Pharmacy  VENLAFAXINE HCL ER 37.5 MG CAP 10/18/2022 90 270 each     Contacted [facility] for [employer] Review Call   Chart Review:  Have there been  any documented new, changed, or discontinued medications since last visit? No recent mediation changes.   Has there been any documented recent hospitalizations or ED visits since last visit with Clinical Pharmacist? No recent hospital visits  Disease State Questions:  Able to connect with Patient? Yes  Have you had any visits with new specialists or providers since your last visit?   10/14/2022 Izora Gala MD (ENT) - Patient was seen for laryngopharyngeal reflux.  Have you had any new health care problem(s) since your last visit? Patient denies.  Have you run out of any of your medications since you last spoke with clinical pharmacist? Patient  denies  Are there any medications you are not taking as prescribed? Patient denies  Are you having any issues or side effects with your medications? Patient denies any issues with with medications.   Do you have any other health concerns or questions you want to discuss with your Clinical Pharmacist before your next visit? Patient denies  8. Additional Details? Patient denies any other concerns, he states he is feeling well overall.    Care Gaps: AWV - scheduled 04/21/2023 Last BP - 90/60 on 08/13/2022  Star Rating Drugs: None   [place]  Clinical Pharmacist Assistant [phone]

## 2022-11-20 ENCOUNTER — Other Ambulatory Visit: Payer: Self-pay | Admitting: Family Medicine

## 2022-11-30 DIAGNOSIS — E039 Hypothyroidism, unspecified: Secondary | ICD-10-CM | POA: Diagnosis not present

## 2022-12-15 ENCOUNTER — Encounter: Payer: Self-pay | Admitting: Internal Medicine

## 2022-12-15 ENCOUNTER — Emergency Department (HOSPITAL_COMMUNITY): Payer: Medicare Other

## 2022-12-15 ENCOUNTER — Encounter (HOSPITAL_COMMUNITY)
Admission: EM | Disposition: A | Discharge status: Home Health | Payer: Self-pay | Source: Home / Self Care | Attending: Pulmonary Disease

## 2022-12-15 ENCOUNTER — Inpatient Hospital Stay (HOSPITAL_COMMUNITY)
Admission: EM | Admit: 2022-12-15 | Discharge: 2022-12-22 | DRG: 277 | Disposition: A | Discharge status: Home Health | Payer: Medicare Other | Attending: Internal Medicine | Admitting: Internal Medicine

## 2022-12-15 ENCOUNTER — Inpatient Hospital Stay (HOSPITAL_COMMUNITY): Payer: Medicare Other

## 2022-12-15 ENCOUNTER — Ambulatory Visit: Payer: Medicare Other | Attending: Internal Medicine | Admitting: Internal Medicine

## 2022-12-15 VITALS — BP 108/72 | HR 60 | Ht 73.0 in | Wt 190.0 lb

## 2022-12-15 DIAGNOSIS — G720 Drug-induced myopathy: Secondary | ICD-10-CM

## 2022-12-15 DIAGNOSIS — G931 Anoxic brain damage, not elsewhere classified: Secondary | ICD-10-CM | POA: Diagnosis present

## 2022-12-15 DIAGNOSIS — I499 Cardiac arrhythmia, unspecified: Secondary | ICD-10-CM | POA: Diagnosis not present

## 2022-12-15 DIAGNOSIS — Z85828 Personal history of other malignant neoplasm of skin: Secondary | ICD-10-CM | POA: Diagnosis not present

## 2022-12-15 DIAGNOSIS — Z91041 Radiographic dye allergy status: Secondary | ICD-10-CM

## 2022-12-15 DIAGNOSIS — Z955 Presence of coronary angioplasty implant and graft: Secondary | ICD-10-CM

## 2022-12-15 DIAGNOSIS — K219 Gastro-esophageal reflux disease without esophagitis: Secondary | ICD-10-CM | POA: Diagnosis not present

## 2022-12-15 DIAGNOSIS — I442 Atrioventricular block, complete: Secondary | ICD-10-CM | POA: Diagnosis not present

## 2022-12-15 DIAGNOSIS — M47812 Spondylosis without myelopathy or radiculopathy, cervical region: Secondary | ICD-10-CM | POA: Diagnosis not present

## 2022-12-15 DIAGNOSIS — G4733 Obstructive sleep apnea (adult) (pediatric): Secondary | ICD-10-CM

## 2022-12-15 DIAGNOSIS — I255 Ischemic cardiomyopathy: Secondary | ICD-10-CM | POA: Diagnosis present

## 2022-12-15 DIAGNOSIS — Z888 Allergy status to other drugs, medicaments and biological substances status: Secondary | ICD-10-CM | POA: Diagnosis not present

## 2022-12-15 DIAGNOSIS — Z79899 Other long term (current) drug therapy: Secondary | ICD-10-CM | POA: Diagnosis not present

## 2022-12-15 DIAGNOSIS — Z7982 Long term (current) use of aspirin: Secondary | ICD-10-CM

## 2022-12-15 DIAGNOSIS — F418 Other specified anxiety disorders: Secondary | ICD-10-CM | POA: Diagnosis present

## 2022-12-15 DIAGNOSIS — I509 Heart failure, unspecified: Secondary | ICD-10-CM | POA: Diagnosis not present

## 2022-12-15 DIAGNOSIS — R0789 Other chest pain: Secondary | ICD-10-CM

## 2022-12-15 DIAGNOSIS — Z808 Family history of malignant neoplasm of other organs or systems: Secondary | ICD-10-CM

## 2022-12-15 DIAGNOSIS — N400 Enlarged prostate without lower urinary tract symptoms: Secondary | ICD-10-CM | POA: Diagnosis present

## 2022-12-15 DIAGNOSIS — I495 Sick sinus syndrome: Secondary | ICD-10-CM

## 2022-12-15 DIAGNOSIS — R404 Transient alteration of awareness: Secondary | ICD-10-CM | POA: Diagnosis not present

## 2022-12-15 DIAGNOSIS — N179 Acute kidney failure, unspecified: Secondary | ICD-10-CM | POA: Diagnosis not present

## 2022-12-15 DIAGNOSIS — I2510 Atherosclerotic heart disease of native coronary artery without angina pectoris: Secondary | ICD-10-CM

## 2022-12-15 DIAGNOSIS — Z79890 Hormone replacement therapy (postmenopausal): Secondary | ICD-10-CM

## 2022-12-15 DIAGNOSIS — Z91030 Bee allergy status: Secondary | ICD-10-CM | POA: Diagnosis not present

## 2022-12-15 DIAGNOSIS — I252 Old myocardial infarction: Secondary | ICD-10-CM

## 2022-12-15 DIAGNOSIS — J9811 Atelectasis: Secondary | ICD-10-CM | POA: Diagnosis not present

## 2022-12-15 DIAGNOSIS — Z8249 Family history of ischemic heart disease and other diseases of the circulatory system: Secondary | ICD-10-CM

## 2022-12-15 DIAGNOSIS — E039 Hypothyroidism, unspecified: Secondary | ICD-10-CM | POA: Diagnosis present

## 2022-12-15 DIAGNOSIS — T466X5D Adverse effect of antihyperlipidemic and antiarteriosclerotic drugs, subsequent encounter: Secondary | ICD-10-CM | POA: Diagnosis not present

## 2022-12-15 DIAGNOSIS — I462 Cardiac arrest due to underlying cardiac condition: Secondary | ICD-10-CM | POA: Diagnosis not present

## 2022-12-15 DIAGNOSIS — R739 Hyperglycemia, unspecified: Secondary | ICD-10-CM | POA: Diagnosis present

## 2022-12-15 DIAGNOSIS — E785 Hyperlipidemia, unspecified: Secondary | ICD-10-CM

## 2022-12-15 DIAGNOSIS — I469 Cardiac arrest, cause unspecified: Secondary | ICD-10-CM | POA: Diagnosis not present

## 2022-12-15 DIAGNOSIS — Z743 Need for continuous supervision: Secondary | ICD-10-CM | POA: Diagnosis not present

## 2022-12-15 DIAGNOSIS — I493 Ventricular premature depolarization: Secondary | ICD-10-CM | POA: Diagnosis present

## 2022-12-15 DIAGNOSIS — I25118 Atherosclerotic heart disease of native coronary artery with other forms of angina pectoris: Secondary | ICD-10-CM

## 2022-12-15 DIAGNOSIS — K6389 Other specified diseases of intestine: Secondary | ICD-10-CM | POA: Diagnosis not present

## 2022-12-15 DIAGNOSIS — Z882 Allergy status to sulfonamides status: Secondary | ICD-10-CM

## 2022-12-15 DIAGNOSIS — I4891 Unspecified atrial fibrillation: Secondary | ICD-10-CM | POA: Diagnosis not present

## 2022-12-15 DIAGNOSIS — K5939 Other megacolon: Secondary | ICD-10-CM | POA: Diagnosis not present

## 2022-12-15 DIAGNOSIS — Z4682 Encounter for fitting and adjustment of non-vascular catheter: Secondary | ICD-10-CM | POA: Diagnosis not present

## 2022-12-15 DIAGNOSIS — Z8261 Family history of arthritis: Secondary | ICD-10-CM

## 2022-12-15 DIAGNOSIS — N3289 Other specified disorders of bladder: Secondary | ICD-10-CM | POA: Diagnosis not present

## 2022-12-15 DIAGNOSIS — I4901 Ventricular fibrillation: Principal | ICD-10-CM | POA: Diagnosis present

## 2022-12-15 DIAGNOSIS — Z800 Family history of malignant neoplasm of digestive organs: Secondary | ICD-10-CM

## 2022-12-15 DIAGNOSIS — Z825 Family history of asthma and other chronic lower respiratory diseases: Secondary | ICD-10-CM

## 2022-12-15 DIAGNOSIS — E876 Hypokalemia: Secondary | ICD-10-CM | POA: Diagnosis not present

## 2022-12-15 DIAGNOSIS — M4312 Spondylolisthesis, cervical region: Secondary | ICD-10-CM | POA: Diagnosis not present

## 2022-12-15 DIAGNOSIS — R4182 Altered mental status, unspecified: Secondary | ICD-10-CM | POA: Diagnosis not present

## 2022-12-15 HISTORY — PX: LEFT HEART CATH AND CORONARY ANGIOGRAPHY: CATH118249

## 2022-12-15 LAB — I-STAT CHEM 8, ED
BUN: 23 mg/dL (ref 8–23)
Calcium, Ion: 1.05 mmol/L — ABNORMAL LOW (ref 1.15–1.40)
Chloride: 103 mmol/L (ref 98–111)
Creatinine, Ser: 1.4 mg/dL — ABNORMAL HIGH (ref 0.61–1.24)
Glucose, Bld: 105 mg/dL — ABNORMAL HIGH (ref 70–99)
HCT: 45 % (ref 39.0–52.0)
Hemoglobin: 15.3 g/dL (ref 13.0–17.0)
Potassium: 4.4 mmol/L (ref 3.5–5.1)
Sodium: 137 mmol/L (ref 135–145)
TCO2: 19 mmol/L — ABNORMAL LOW (ref 22–32)

## 2022-12-15 LAB — GLUCOSE, CAPILLARY
Glucose-Capillary: 173 mg/dL — ABNORMAL HIGH (ref 70–99)
Glucose-Capillary: 217 mg/dL — ABNORMAL HIGH (ref 70–99)

## 2022-12-15 LAB — APTT: aPTT: 28 seconds (ref 24–36)

## 2022-12-15 LAB — CBC
HCT: 44.5 % (ref 39.0–52.0)
HCT: 41.3 % (ref 39.0–52.0)
Hemoglobin: 15.3 g/dL (ref 13.0–17.0)
Hemoglobin: 14.8 g/dL (ref 13.0–17.0)
MCH: 35.7 pg — ABNORMAL HIGH (ref 26.0–34.0)
MCH: 35.7 pg — ABNORMAL HIGH (ref 26.0–34.0)
MCHC: 34.4 g/dL (ref 30.0–36.0)
MCHC: 35.8 g/dL (ref 30.0–36.0)
MCV: 104 fL — ABNORMAL HIGH (ref 80.0–100.0)
MCV: 99.8 fL (ref 80.0–100.0)
Platelets: 153 10*3/uL (ref 150–400)
Platelets: 180 10*3/uL (ref 150–400)
RBC: 4.28 MIL/uL (ref 4.22–5.81)
RBC: 4.14 MIL/uL — ABNORMAL LOW (ref 4.22–5.81)
RDW: 11.8 % (ref 11.5–15.5)
RDW: 11.9 % (ref 11.5–15.5)
WBC: 10.2 10*3/uL (ref 4.0–10.5)
WBC: 8.3 10*3/uL (ref 4.0–10.5)
nRBC: 0 % (ref 0.0–0.2)
nRBC: 0 % (ref 0.0–0.2)

## 2022-12-15 LAB — POCT I-STAT 7, (LYTES, BLD GAS, ICA,H+H)
Acid-base deficit: 7 mmol/L — ABNORMAL HIGH (ref 0.0–2.0)
Acid-base deficit: 8 mmol/L — ABNORMAL HIGH (ref 0.0–2.0)
Bicarbonate: 16 mmol/L — ABNORMAL LOW (ref 20.0–28.0)
Bicarbonate: 16.4 mmol/L — ABNORMAL LOW (ref 20.0–28.0)
Calcium, Ion: 1.05 mmol/L — ABNORMAL LOW (ref 1.15–1.40)
Calcium, Ion: 1.05 mmol/L — ABNORMAL LOW (ref 1.15–1.40)
HCT: 39 % (ref 39.0–52.0)
HCT: 40 % (ref 39.0–52.0)
Hemoglobin: 13.3 g/dL (ref 13.0–17.0)
Hemoglobin: 13.6 g/dL (ref 13.0–17.0)
O2 Saturation: 100 %
O2 Saturation: 100 %
Potassium: 2.9 mmol/L — ABNORMAL LOW (ref 3.5–5.1)
Potassium: 2.9 mmol/L — ABNORMAL LOW (ref 3.5–5.1)
Sodium: 137 mmol/L (ref 135–145)
Sodium: 137 mmol/L (ref 135–145)
TCO2: 17 mmol/L — ABNORMAL LOW (ref 22–32)
TCO2: 17 mmol/L — ABNORMAL LOW (ref 22–32)
pCO2 arterial: 27.3 mmHg — ABNORMAL LOW (ref 32–48)
pCO2 arterial: 27.5 mmHg — ABNORMAL LOW (ref 32–48)
pH, Arterial: 7.373 (ref 7.35–7.45)
pH, Arterial: 7.387 (ref 7.35–7.45)
pO2, Arterial: 484 mmHg — ABNORMAL HIGH (ref 83–108)
pO2, Arterial: 492 mmHg — ABNORMAL HIGH (ref 83–108)

## 2022-12-15 LAB — COMPREHENSIVE METABOLIC PANEL
ALT: 82 U/L — ABNORMAL HIGH (ref 0–44)
AST: 83 U/L — ABNORMAL HIGH (ref 15–41)
Albumin: 3.8 g/dL (ref 3.5–5.0)
Alkaline Phosphatase: 80 U/L (ref 38–126)
Anion gap: 15 (ref 5–15)
BUN: 18 mg/dL (ref 8–23)
CO2: 19 mmol/L — ABNORMAL LOW (ref 22–32)
Calcium: 8.8 mg/dL — ABNORMAL LOW (ref 8.9–10.3)
Chloride: 100 mmol/L (ref 98–111)
Creatinine, Ser: 1.55 mg/dL — ABNORMAL HIGH (ref 0.61–1.24)
GFR, Estimated: 47 mL/min — ABNORMAL LOW (ref >60)
Glucose, Bld: 107 mg/dL — ABNORMAL HIGH (ref 70–99)
Potassium: 4.3 mmol/L (ref 3.5–5.1)
Sodium: 134 mmol/L — ABNORMAL LOW (ref 135–145)
Total Bilirubin: 1.3 mg/dL — ABNORMAL HIGH (ref 0.3–1.2)
Total Protein: 6.7 g/dL (ref 6.5–8.1)

## 2022-12-15 LAB — ECHOCARDIOGRAM COMPLETE
Area-P 1/2: 3.42 cm2
Calc EF: 60.6 %
Est EF: 55
Height: 73 in
MV M vel: 2.67 m/s
MV Peak grad: 28.5 mmHg
S' Lateral: 3.7 cm
Single Plane A2C EF: 60.3 %
Single Plane A4C EF: 62.7 %
Weight: 3040 oz

## 2022-12-15 LAB — I-STAT ARTERIAL BLOOD GAS, ED
Acid-base deficit: 5 mmol/L — ABNORMAL HIGH (ref 0.0–2.0)
Bicarbonate: 19.5 mmol/L — ABNORMAL LOW (ref 20.0–28.0)
Calcium, Ion: 1.13 mmol/L — ABNORMAL LOW (ref 1.15–1.40)
HCT: 41 % (ref 39.0–52.0)
Hemoglobin: 13.9 g/dL (ref 13.0–17.0)
O2 Saturation: 100 %
Patient temperature: 98.4
Potassium: 3.6 mmol/L (ref 3.5–5.1)
Sodium: 138 mmol/L (ref 135–145)
TCO2: 21 mmol/L — ABNORMAL LOW (ref 22–32)
pCO2 arterial: 35.3 mmHg (ref 32–48)
pH, Arterial: 7.349 — ABNORMAL LOW (ref 7.35–7.45)
pO2, Arterial: 496 mmHg — ABNORMAL HIGH (ref 83–108)

## 2022-12-15 LAB — TROPONIN I (HIGH SENSITIVITY)
Troponin I (High Sensitivity): 60 ng/L — ABNORMAL HIGH (ref <18)
Troponin I (High Sensitivity): 910 ng/L (ref <18)

## 2022-12-15 LAB — TRIGLYCERIDES: Triglycerides: 111 mg/dL (ref <150)

## 2022-12-15 LAB — TYPE AND SCREEN
ABO/RH(D): O POS
Antibody Screen: NEGATIVE

## 2022-12-15 LAB — CREATININE, SERUM
Creatinine, Ser: 1.46 mg/dL — ABNORMAL HIGH (ref 0.61–1.24)
GFR, Estimated: 50 mL/min — ABNORMAL LOW (ref >60)

## 2022-12-15 LAB — PROCALCITONIN: Procalcitonin: <0.1 ng/mL

## 2022-12-15 LAB — PROTIME-INR
INR: 1.1 (ref 0.8–1.2)
Prothrombin Time: 14.5 seconds (ref 11.4–15.2)

## 2022-12-15 LAB — MAGNESIUM: Magnesium: 2 mg/dL (ref 1.7–2.4)

## 2022-12-15 LAB — PHOSPHORUS: Phosphorus: 5.4 mg/dL — ABNORMAL HIGH (ref 2.5–4.6)

## 2022-12-15 LAB — LACTIC ACID, PLASMA
Lactic Acid, Venous: 9 mmol/L (ref 0.5–1.9)
Lactic Acid, Venous: >9 mmol/L (ref 0.5–1.9)

## 2022-12-15 SURGERY — LEFT HEART CATH AND CORONARY ANGIOGRAPHY
Anesthesia: LOCAL

## 2022-12-15 MED ORDER — VERAPAMIL HCL 2.5 MG/ML IV SOLN
INTRAVENOUS | Status: DC | PRN
  Administered 2022-12-15: 10 mL via INTRA_ARTERIAL

## 2022-12-15 MED ORDER — SODIUM CHLORIDE 0.9 % IV SOLN
250.0000 mL | INTRAVENOUS | Status: DC
  Administered 2022-12-16: 250 mL via INTRAVENOUS

## 2022-12-15 MED ORDER — SODIUM CHLORIDE 0.9 % IV SOLN: INTRAVENOUS | Status: AC

## 2022-12-15 MED ORDER — HEPARIN SODIUM (PORCINE) 5000 UNIT/ML IJ SOLN
5000.0000 [IU] | Freq: Three times a day (TID) | INTRAMUSCULAR | Status: DC
  Administered 2022-12-15 – 2022-12-19 (×13): 5000 [IU] via SUBCUTANEOUS
  Filled 2022-12-15 (×13): qty 1

## 2022-12-15 MED ORDER — ACETAMINOPHEN 650 MG RE SUPP: 650.0000 mg | RECTAL | Status: DC | PRN

## 2022-12-15 MED ORDER — HEPARIN SODIUM (PORCINE) 1000 UNIT/ML IJ SOLN
INTRAMUSCULAR | Status: AC
  Filled 2022-12-15: qty 10

## 2022-12-15 MED ORDER — MAGNESIUM SULFATE 2 GM/50ML IV SOLN: 2.0000 g | Freq: Once | INTRAVENOUS | Status: DC | PRN

## 2022-12-15 MED ORDER — DOCUSATE SODIUM 50 MG/5ML PO LIQD
100.0000 mg | Freq: Two times a day (BID) | ORAL | Status: DC
  Administered 2022-12-15 – 2022-12-16 (×3): 100 mg
  Filled 2022-12-15 (×3): qty 10

## 2022-12-15 MED ORDER — SODIUM CHLORIDE 0.9% FLUSH
3.0000 mL | Freq: Two times a day (BID) | INTRAVENOUS | Status: DC
  Administered 2022-12-15 – 2022-12-22 (×14): 3 mL via INTRAVENOUS

## 2022-12-15 MED ORDER — POTASSIUM CHLORIDE 10 MEQ/50ML IV SOLN: INTRAVENOUS | Status: DC | PRN

## 2022-12-15 MED ORDER — FENTANYL CITRATE PF 50 MCG/ML IJ SOSY
25.0000 ug | PREFILLED_SYRINGE | Freq: Once | INTRAMUSCULAR | Status: AC
  Administered 2022-12-16: 25 ug via INTRAVENOUS
  Filled 2022-12-15: qty 1

## 2022-12-15 MED ORDER — ONDANSETRON HCL 4 MG/2ML IJ SOLN
4.0000 mg | Freq: Four times a day (QID) | INTRAMUSCULAR | Status: DC | PRN
  Administered 2022-12-17: 4 mg via INTRAVENOUS
  Filled 2022-12-15: qty 2

## 2022-12-15 MED ORDER — SODIUM CHLORIDE 0.9 % IV SOLN: 250.0000 mL | INTRAVENOUS | Status: DC | PRN

## 2022-12-15 MED ORDER — ACETAMINOPHEN 650 MG RE SUPP
650.0000 mg | RECTAL | Status: DC
  Administered 2022-12-16 – 2022-12-17 (×3): 650 mg via RECTAL
  Filled 2022-12-15 (×3): qty 1

## 2022-12-15 MED ORDER — PROPOFOL 1000 MG/100ML IV EMUL: 0.0000 ug/kg/min | INTRAVENOUS | Status: DC

## 2022-12-15 MED ORDER — POTASSIUM CHLORIDE 10 MEQ/50ML IV SOLN
10.0000 meq | INTRAVENOUS | Status: AC
  Administered 2022-12-15: 10 meq via INTRAVENOUS
  Filled 2022-12-15: qty 50

## 2022-12-15 MED ORDER — ACETAMINOPHEN 160 MG/5ML PO SOLN
650.0000 mg | ORAL | Status: DC
  Administered 2022-12-15 – 2022-12-16 (×5): 650 mg
  Filled 2022-12-15 (×5): qty 20.3

## 2022-12-15 MED ORDER — LIDOCAINE HCL (PF) 1 % IJ SOLN
INTRAMUSCULAR | Status: DC | PRN
  Administered 2022-12-15: 2 mL

## 2022-12-15 MED ORDER — EPINEPHRINE HCL 5 MG/250ML IV SOLN IN NS
0.5000 ug/min | INTRAVENOUS | Status: DC
  Administered 2022-12-15: 3 ug/min via INTRAVENOUS
  Administered 2022-12-15: 5 ug/min via INTRAVENOUS
  Filled 2022-12-15: qty 250

## 2022-12-15 MED ORDER — HEPARIN (PORCINE) IN NACL 1000-0.9 UT/500ML-% IV SOLN
INTRAVENOUS | Status: DC | PRN
  Administered 2022-12-15 (×2): 500 mL

## 2022-12-15 MED ORDER — NOREPINEPHRINE 4 MG/250ML-% IV SOLN
2.0000 ug/min | INTRAVENOUS | Status: DC
  Filled 2022-12-15: qty 250

## 2022-12-15 MED ORDER — FENTANYL BOLUS VIA INFUSION: 25.0000 ug | INTRAVENOUS | Status: DC | PRN

## 2022-12-15 MED ORDER — FENTANYL 2500MCG IN NS 250ML (10MCG/ML) PREMIX INFUSION
25.0000 ug/h | INTRAVENOUS | Status: DC
  Administered 2022-12-15: 25 ug/h via INTRAVENOUS
  Filled 2022-12-15: qty 250

## 2022-12-15 MED ORDER — MIDAZOLAM-SODIUM CHLORIDE 100-0.9 MG/100ML-% IV SOLN
0.0000 mg/h | INTRAVENOUS | Status: DC
  Administered 2022-12-15: 4 mg/h via INTRAVENOUS
  Administered 2022-12-15: 2 mg/h via INTRAVENOUS
  Filled 2022-12-15: qty 100

## 2022-12-15 MED ORDER — FENTANYL CITRATE (PF) 100 MCG/2ML IJ SOLN
INTRAMUSCULAR | Status: AC
  Filled 2022-12-15: qty 2

## 2022-12-15 MED ORDER — SODIUM CHLORIDE 0.9% FLUSH
3.0000 mL | Freq: Two times a day (BID) | INTRAVENOUS | Status: DC
  Administered 2022-12-15 – 2022-12-20 (×11): 3 mL via INTRAVENOUS

## 2022-12-15 MED ORDER — BUSPIRONE HCL 10 MG PO TABS: 30.0000 mg | ORAL_TABLET | Freq: Three times a day (TID) | ORAL | Status: DC | PRN

## 2022-12-15 MED ORDER — METHYLPREDNISOLONE SODIUM SUCC 125 MG IJ SOLR
80.0000 mg | Freq: Once | INTRAMUSCULAR | Status: DC
  Filled 2022-12-15: qty 2

## 2022-12-15 MED ORDER — VERAPAMIL HCL 2.5 MG/ML IV SOLN
INTRAVENOUS | Status: AC
  Filled 2022-12-15: qty 2

## 2022-12-15 MED ORDER — HEPARIN SODIUM (PORCINE) 1000 UNIT/ML IJ SOLN
INTRAMUSCULAR | Status: DC | PRN
  Administered 2022-12-15: 4000 [IU] via INTRAVENOUS

## 2022-12-15 MED ORDER — SODIUM CHLORIDE 0.9 % IV SOLN: INTRAVENOUS | Status: DC

## 2022-12-15 MED ORDER — NOREPINEPHRINE 4 MG/250ML-% IV SOLN
5.0000 ug/min | INTRAVENOUS | Status: DC
  Filled 2022-12-15: qty 250

## 2022-12-15 MED ORDER — ACETAMINOPHEN 325 MG PO TABS: 650.0000 mg | ORAL_TABLET | ORAL | Status: DC

## 2022-12-15 MED ORDER — FENTANYL 2500MCG IN NS 250ML (10MCG/ML) PREMIX INFUSION
25.0000 ug/h | INTRAVENOUS | Status: DC
  Administered 2022-12-15: 100 ug/h via INTRAVENOUS

## 2022-12-15 MED ORDER — MIDAZOLAM BOLUS VIA INFUSION: 0.0000 mg | INTRAVENOUS | Status: DC | PRN

## 2022-12-15 MED ORDER — LACTATED RINGERS IV BOLUS
1000.0000 mL | Freq: Once | INTRAVENOUS | Status: AC
  Administered 2022-12-15: 1000 mL via INTRAVENOUS

## 2022-12-15 MED ORDER — ACETAMINOPHEN 160 MG/5ML PO SOLN: 650.0000 mg | ORAL | Status: DC | PRN

## 2022-12-15 MED ORDER — PROPOFOL 1000 MG/100ML IV EMUL
0.0000 ug/kg/min | INTRAVENOUS | Status: DC
  Administered 2022-12-15: 15 ug/kg/min via INTRAVENOUS
  Administered 2022-12-15 – 2022-12-16 (×2): 20 ug/kg/min via INTRAVENOUS
  Filled 2022-12-15 (×3): qty 100

## 2022-12-15 MED ORDER — POLYETHYLENE GLYCOL 3350 17 G PO PACK
17.0000 g | PACK | Freq: Every day | ORAL | Status: DC
  Administered 2022-12-15 – 2022-12-16 (×2): 17 g
  Filled 2022-12-15 (×2): qty 1

## 2022-12-15 MED ORDER — METHYLPREDNISOLONE SODIUM SUCC 40 MG IJ SOLR
40.0000 mg | Freq: Once | INTRAMUSCULAR | Status: AC
  Administered 2022-12-15: 40 mg via INTRAVENOUS
  Filled 2022-12-15: qty 1

## 2022-12-15 MED ORDER — FENTANYL CITRATE PF 50 MCG/ML IJ SOSY
25.0000 ug | PREFILLED_SYRINGE | Freq: Once | INTRAMUSCULAR | Status: AC
  Administered 2022-12-15: 25 ug via INTRAVENOUS

## 2022-12-15 MED ORDER — HEPARIN (PORCINE) IN NACL 1000-0.9 UT/500ML-% IV SOLN
INTRAVENOUS | Status: AC
  Filled 2022-12-15: qty 500

## 2022-12-15 MED ORDER — DIPHENHYDRAMINE HCL 50 MG/ML IJ SOLN
50.0000 mg | Freq: Once | INTRAMUSCULAR | Status: AC
  Administered 2022-12-15: 50 mg via INTRAVENOUS
  Filled 2022-12-15: qty 1

## 2022-12-15 MED ORDER — ACETAMINOPHEN 325 MG PO TABS: 650.0000 mg | ORAL_TABLET | ORAL | Status: DC | PRN

## 2022-12-15 MED ORDER — SODIUM CHLORIDE 0.9% FLUSH: 3.0000 mL | INTRAVENOUS | Status: DC | PRN

## 2022-12-15 MED ORDER — IOHEXOL 350 MG/ML SOLN
INTRAVENOUS | Status: DC | PRN
  Administered 2022-12-15: 70 mL

## 2022-12-15 MED ORDER — NOREPINEPHRINE 4 MG/250ML-% IV SOLN
0.0000 ug/min | INTRAVENOUS | Status: DC
  Administered 2022-12-15: 2 ug/min via INTRAVENOUS
  Administered 2022-12-16: 4 ug/min via INTRAVENOUS
  Filled 2022-12-15: qty 250

## 2022-12-15 MED ORDER — FAMOTIDINE 20 MG PO TABS
20.0000 mg | ORAL_TABLET | Freq: Two times a day (BID) | ORAL | Status: DC
  Administered 2022-12-15 – 2022-12-16 (×3): 20 mg
  Filled 2022-12-15 (×3): qty 1

## 2022-12-15 MED ORDER — FENTANYL CITRATE PF 50 MCG/ML IJ SOSY
100.0000 ug | PREFILLED_SYRINGE | Freq: Once | INTRAMUSCULAR | Status: AC
  Administered 2022-12-15: 100 ug via INTRAVENOUS

## 2022-12-15 MED ORDER — IOHEXOL 350 MG/ML SOLN
75.0000 mL | Freq: Once | INTRAVENOUS | Status: AC | PRN
  Administered 2022-12-15: 75 mL via INTRAVENOUS

## 2022-12-15 MED ORDER — LIDOCAINE HCL (PF) 1 % IJ SOLN
INTRAMUSCULAR | Status: AC
  Filled 2022-12-15: qty 30

## 2022-12-15 SURGICAL SUPPLY — 12 items
CATH LAUNCHER 6FR EBU3.5 (CATHETERS) IMPLANT
CATH OPTITORQUE TIG 4.0 5F (CATHETERS) IMPLANT
DEVICE RAD COMP TR BAND LRG (VASCULAR PRODUCTS) IMPLANT
GLIDESHEATH SLEND SS 6F .021 (SHEATH) IMPLANT
GUIDEWIRE INQWIRE 1.5J.035X260 (WIRE) IMPLANT
INQWIRE 1.5J .035X260CM (WIRE) ×1
KIT ENCORE 26 ADVANTAGE (KITS) IMPLANT
KIT HEART LEFT (KITS) ×1 IMPLANT
PACK CARDIAC CATHETERIZATION (CUSTOM PROCEDURE TRAY) ×1 IMPLANT
SYR MEDRAD MARK 7 150ML (SYRINGE) ×1 IMPLANT
TRANSDUCER W/STOPCOCK (MISCELLANEOUS) ×1 IMPLANT
TUBING CIL FLEX 10 FLL-RA (TUBING) ×1 IMPLANT

## 2022-12-15 NOTE — Progress Notes (Signed)
Cardiology Office Note:    Date:  12/15/2022  ID:  Matthew Pratt, DOB [birth year], MRN [id number]  PCP:  [name], MD  Cardiologist:  Elouise Munroe, MD  Electrophysiologist:  None   Referring MD: [name], MD   Chief Complaint/Reason for Referral: Follow-up evaluation  History of Present Illness:    Matthew Pratt is a 72 y.o. male with a history of STEMI 10/15/10 with Promus DES placed x 1 in OM1, BPH, HLD, OSA on CPAP.   Patient was last seen by me on 12/30/2020 and he was able to exert himself >4 METS with no chest pain, no DOE. At time of MI in 2011, he had typical crushing chest pain. He had had no recurrence of this quality of pain. He felt he had been well since his last cardiology visit. Used his recumbent bike at at least a moderate level with no symptoms. He had OSA and used CPAP. One lifetime episode of syncope, 35 years ago at work, remembers having tunnel vision. Noted significant myalgias with statins. Discussed PCSK9I with indication of secondary prevention of CAD. He wanted to consider and we would arrange CVRR visit. An echocardiogram was to be obtained for cardioavascular function prior to surgery, with no concerning findings.  He followed up with Matthew Pratt, Matthew Pratt on 01/07/2021 for follow-up evaluation of his hyperlipidemia and cholesterol labs were ordered that day and repeated after 5-6 doses of Repatha.  She presented to the ED on 05/05/2022 for chest pain ongoing for about 2 weeks intermittently. Described as mid to left-sided chest pain. Nonradiating. He had an episode the night before. Symptoms lasted for several hours and continued that morning as well. Trops x 2 negative. Cardiac ischemia felt less likely.   He was seen by Matthew Pelton, Matthew Pratt on 05/11/22 for follow-up evaluation. He was doing well overall with no medical concerns.  Overall has been well. He is 190 pounds today and would like to lose 15 pounds. He notes that the 30  pounds of weight he has lost has improved his pain.  He has been off of his keto diet since September labs. He has been following a keto diet, consuming a higher fat/protein and low carbohydrates. But he has discontinued this diet in the recent months.  He reports that his chest pain has resolved and denies any recent falls. However, when rapidly turning, it causes some imbalance, but he is able to catch himself. He is tolerating his CPAP. He has required nitroglycerin 1 time in last year, and none since his last visit with Matthew Pratt.  He is tolerating his aspirin 81 mg and has not had to take his nitroglycerin 0.4 mg in last 6 months to year. He has only taken nitroglycerin once in the past. He had previously discontinued Repatha.  His liver enzymes September, 2023 have normalized. Electrolytes and kidney function normal. WBC slightly low, but overall unchanged. His lab work revealed an LDL of 160. His cholesterol improved on Repatha but he did not tolerate, he cannot remember why.  The patient denies chest pain, chest pressure, dyspnea at rest or with exertion, palpitations, PND, orthopnea, or leg swelling. Denies cough, fever, chills. Denies nausea, vomiting. Denies snoring.   Past Medical History:  Diagnosis Date   Anxiety    Arthritis    BPH (benign prostatic hypertrophy)    CAD (coronary artery disease)    s/p STEMI 10/15/10 w/ Promus DES placed x1 in the first OM  Colon polyps    Complication of anesthesia    pt. reported 2015 penile inplant when getting some anesthesia had chest pain,they readjusted  the meds. and continued with surgery. Had no further problems di not have to follow up with anyone.   Depression    Depression with anxiety    Dupuytren's contracture of hand    ED (erectile dysfunction)    GERD (gastroesophageal reflux disease)    Glaucoma    HLD (hyperlipidemia)    Hypocontractile bladder    Hypothyroidism    MI (myocardial infarction) ([place])    denies    OSA on CPAP    Peyronie's disease    Rosacea    Skin cancer    BCC    Past Surgical History:  Procedure Laterality Date   CHOLECYSTECTOMY N/A 02/10/2021   Procedure: LAPAROSCOPIC CHOLECYSTECTOMY;  Surgeon: Coralie Keens, MD;  Location: [facility];  Service: General;  Laterality: N/A;   COLONOSCOPY WITH ESOPHAGOGASTRODUODENOSCOPY (EGD)     multiple   CORONARY STENT PLACEMENT     INNER EAR SURGERY     LAPAROSCOPIC CHOLECYSTECTOMY Bilateral 02/09/2021   Dr.blackmon   LASIK  02/1998   PENILE PROSTHESIS IMPLANT     TONSILECTOMY, ADENOIDECTOMY, BILATERAL MYRINGOTOMY AND TUBES     TONSILLECTOMY     VASECTOMY     WISDOM TOOTH EXTRACTION      Current Medications: Current Meds  Medication Sig   acetaminophen (TYLENOL) 500 MG tablet Take 500 mg by mouth every 6 (six) hours as needed.   aspirin EC 81 MG tablet Take 81 mg by mouth every morning.   b complex vitamins capsule Take 1 capsule by mouth daily at 6 PM.   calcium carbonate (OS-CAL) 600 MG TABS tablet Take 600 mg by mouth daily with breakfast. With 680 units of vitamin D   Cholecalciferol (VITAMIN D3) 50 MCG (2000 UT) TABS Take 2,000 Units by mouth daily at 6 PM.   Digestive Enzymes (SUPER ENZYMES PO) Take 1 capsule by mouth daily.   EPINEPHrine 0.3 mg/0.3 mL IJ SOAJ injection Inject into the muscle as needed.   latanoprost (XALATAN) 0.005 % ophthalmic solution Place 1 drop into both eyes at bedtime.   omeprazole (PRILOSEC OTC) 20 MG tablet Take 20 mg by mouth daily at 6 PM.   Selenium 100 MCG TABS Take 100 mcg by mouth daily.   senna (SENOKOT) 8.6 MG tablet Take 1 tablet by mouth as needed for constipation.   SYNTHROID 125 MCG tablet TAKE 1 TABLET BY MOUTH EVERY DAY   timolol (TIMOPTIC) 0.5 % ophthalmic solution Place 1 drop into both eyes 2 (two) times daily.   traZODone (DESYREL) 50 MG tablet TAKE 1/2 TO 1 TABLET BY MOUTH AT NIGHT AS NEEDED FOR INSOMNIA   TURMERIC PO Take 1,500 mg by mouth daily.   venlafaxine XR (EFFEXOR-XR)  37.5 MG 24 hr capsule TAKE 3 CAPSULES BY MOUTH EVERY DAY   zinc gluconate 50 MG tablet Take 50 mg by mouth daily at 6 PM.     Allergies:   Bee venom, Crestor [rosuvastatin], Iodinated contrast media, Sulfa antibiotics, and Sulfonamide derivatives   Social History   Tobacco Use   Smoking status: Never   Smokeless tobacco: Never  Vaping Use   Vaping Use: Never used  Substance Use Topics   Alcohol use: No   Drug use: No     Family History: The patient's family history includes Arthritis in his paternal grandfather; Basal cell carcinoma in his father and sister;  COPD in his mother and another family member; Cancer in his father; Colon cancer in his paternal grandfather and paternal grandmother; Heart failure in his maternal grandfather; Liver cancer in his father; Melanoma in his sister; Rheum arthritis in his maternal grandmother; Varicose Veins in his father.  ROS:   Please see the history of present illness.    (+) imbalance with rapid turning All other systems reviewed and are negative.  EKGs/Labs/Other Studies Reviewed:    The following studies were reviewed today:  ECHO 01/20/2021:  IMPRESSIONS     1. Left ventricular ejection fraction, by estimation, is 55 to 60%. The  left ventricle has normal function. The left ventricle has no regional  wall motion abnormalities. There is mild left ventricular hypertrophy.  Left ventricular diastolic parameters  are indeterminate.   2. Right ventricular systolic function is normal. The right ventricular  size is normal.   3. The mitral valve is normal in structure. Trivial mitral valve  regurgitation. No evidence of mitral stenosis.   4. The aortic valve is normal in structure. Aortic valve regurgitation is  mild. No aortic stenosis is present.    EKG:   12/15/22: Sinus rhythm 60 bpm, nonspecific ST abnormality, grossly unchanged from May. 12/30/20: Sinus bradycardia 48 bpm  Imaging studies that I have independently reviewed  today: n/a  Recent Labs: 02/16/2022: Magnesium 2.0 02/22/2022: TSH 3.82 08/13/2022: ALT 26 12/15/2022: BUN 23; Creatinine, Ser 1.40; Hemoglobin 15.3; Platelets 153; Potassium 4.4; Sodium 137   Recent Lipid Panel    Component Value Date/Time   CHOL 219 (H) 08/13/2022 0922   CHOL 163 03/25/2021 0954   TRIG 111 12/15/2022 1310   HDL 41.40 08/13/2022 0922   HDL 59 03/25/2021 0954   CHOLHDL 5 08/13/2022 0922   VLDL 18.2 08/13/2022 0922   LDLCALC 160 (H) 08/13/2022 0922   LDLCALC 86 03/25/2021 0954    Physical Exam:    VS:  BP 108/72   Pulse 60   Ht '6\' 1"'$  (1.854 m)   Wt 190 lb (86.2 kg)   SpO2 95%   BMI 25.07 kg/m     Wt Readings from Last 5 Encounters:  12/15/22 190 lb (86.2 kg)  08/13/22 190 lb 6.4 oz (86.4 kg)  05/11/22 218 lb 6.4 oz (99.1 kg)  05/05/22 215 lb (97.5 kg)  04/19/22 219 lb 3.2 oz (99.4 kg)    Constitutional: No acute distress Eyes: sclera non-icteric, normal conjunctiva and lids ENMT: normal dentition, moist mucous membranes Cardiovascular: regular rhythm, normal rate, no murmur. S1 and S2 normal. No jugular venous distention.  Respiratory: clear to auscultation bilaterally GI : normal bowel sounds, soft and nontender. No distention.   MSK: extremities warm, well perfused. No edema.  NEURO: grossly nonfocal exam, moves all extremities. PSYCH: alert and oriented x 3, normal mood and affect.   ASSESSMENT:    1. Hyperlipidemia, unspecified hyperlipidemia type   2. Atypical chest pain   3. Atherosclerosis of native coronary artery of native heart without angina pectoris   4. SINUS BRADYCARDIA   5. OSA (obstructive sleep apnea)   6. Statin myopathy   7. History of MI (myocardial infarction)    PLAN:    History of MI (myocardial infarction) - Plan: EKG 12-Lead Atherosclerosis of native coronary artery of native heart without angina pectoris Possible cardiac chest pain episode, June 2023 -history of anterolateral STEMI with occluded first OM, s/p DES.  Doing well afterward. No chest pain since June. No nitro use. -continue Aspirin 81 mg  without interruption, daily.  - BP grossly normal. Last echo grossly normal.   Hyperlipidemia, unspecified hyperlipidemia type - statin myalgias, statin intolerant - PCSK9I not tolerated, LDL 160, will arrange pharmacist visit to discuss inclisiran.   SINUS BRADYCARDIA - asymptomatic. Not on beta blockade, does use timolol eye drops. Normal rate today.    OSA (obstructive sleep apnea) -intermittently compliant with CPAP.    Total time of encounter: 30 minutes total time of encounter, including 20 minutes spent in face-to-face patient care on the date of this encounter. This time includes coordination of care and counseling regarding above mentioned problem list. Remainder of non-face-to-face time involved reviewing chart documents/testing relevant to the patient encounter and documentation in the medical record. I have independently reviewed documentation from referring provider.   Matthew Kaiser, MD, [place]   Shared Decision Making/Informed Consent:      Follow-up: 6 months  Medication Adjustments/Labs and Tests Ordered: Current medicines are reviewed at length with the patient today.  Concerns regarding medicines are outlined above.   Orders Placed This Encounter  Procedures   AMB Referral to [facility] Pharm-[facility]   EKG 12-Lead    No orders of the defined types were placed in this encounter.   Patient Instructions  Medication Instructions:  No Changes In Medications at this time.  *If you need a refill on your cardiac medications before your next appointment, please call your pharmacy*  Follow-Up: At [facility], you and your health needs are our priority.  As part of our continuing mission to provide you with exceptional heart care, we have created designated Provider Care Teams.  These Care Teams include your primary Cardiologist (physician) and  Advanced Practice Providers (APPs -  Physician Assistants and Nurse Practitioners) who all work together to provide you with the care you need, when you need it.  PLEASE SCHEDULE APPOINTMENT WITH CVRR (KRISTIN) TO DISCUSS CHOLESTEROL MANAGEMENT AT NEXT AVAILABLE  Your next appointment:   6 month(s)  The format for your next appointment:   In Person  Provider:   Elouise Munroe, MD           I,Mitra Faeizi,acting as a scribe for Elouise Munroe, MD.,have documented all relevant documentation on the behalf of Elouise Munroe, MD,as directed by  Elouise Munroe, MD while in the presence of Elouise Munroe, MD.  I, Elouise Munroe, MD, have reviewed all documentation for the visit on 12/15/2022. The documentation on today's date of service for the exam, diagnosis, procedures, and orders are all accurate and complete.

## 2022-12-15 NOTE — ED Notes (Signed)
Pt intubated, 24@ lip, 7 1/2.

## 2022-12-15 NOTE — Patient Instructions (Addendum)
Medication Instructions:  No Changes In Medications at this time.  *If you need a refill on your cardiac medications before your next appointment, please call your pharmacy*  Follow-Up: At [facility], you and your health needs are our priority.  As part of our continuing mission to provide you with exceptional heart care, we have created designated Provider Care Teams.  These Care Teams include your primary Cardiologist (physician) and Advanced Practice Providers (APPs -  Physician Assistants and Nurse Practitioners) who all work together to provide you with the care you need, when you need it.  PLEASE SCHEDULE APPOINTMENT WITH CVRR (KRISTIN) TO DISCUSS CHOLESTEROL MANAGEMENT AT NEXT AVAILABLE  Your next appointment:   6 month(s)  The format for your next appointment:   In Person  Provider:   Elouise Munroe, MD

## 2022-12-15 NOTE — Progress Notes (Addendum)
   12/15/22 1335  Spiritual Encounters  Type of Visit Initial;Attempt (pt unavailable)  Conversation partners present during encounter Nurse;Physician  Referral source Nurse (RN/NT/LPN)  Reason for visit Urgent spiritual support  OnCall Visit No   Chaplain responded to a call for help with family of patient, Matthew Pratt,  The doctor called the sister of the patient. She is on the way.   Danice Goltz [facility]  [phone]

## 2022-12-15 NOTE — Procedures (Signed)
Central Venous Catheter Insertion Procedure Note  Matthew Pratt  [id number]  Mar 04, 1951  Date:12/15/22  Time:2:44 PM   Provider Performing:Aydin Cavalieri Loletha Grayer Tamala Julian   Procedure: Insertion of Non-tunneled Central Venous Catheter(36556) without US guidance  Indication(s) Medication administration and Difficult access  Consent Risks of the procedure as well as the alternatives and risks of each were explained to the patient and/or caregiver.  Consent for the procedure was obtained and is signed in the bedside chart  Anesthesia Topical only with 1% lidocaine   Timeout Verified patient identification, verified procedure, site/side was marked, verified correct patient position, special equipment/implants available, medications/allergies/relevant history reviewed, required imaging and test results available.  Sterile Technique Maximal sterile technique including full sterile barrier drape, hand hygiene, sterile gown, sterile gloves, mask, hair covering, sterile ultrasound probe cover (if used).  Procedure Description Area of catheter insertion was cleaned with chlorhexidine and draped in sterile fashion.  Without real-time ultrasound guidance a central venous catheter was placed into the left subclavian vein. Nonpulsatile blood flow and easy flushing noted in all ports.  The catheter was sutured in place and sterile dressing applied.  Complications/Tolerance None; patient tolerated the procedure well. Chest X-ray is ordered to verify placement for internal jugular or subclavian cannulation.   Chest x-ray is not ordered for femoral cannulation.  EBL Minimal  Specimen(s) None

## 2022-12-15 NOTE — ED Provider Notes (Signed)
[facility] EMERGENCY DEPARTMENT Provider Note   CSN: [id number] Arrival date & time: 12/15/22  1257     History  Chief Complaint  Patient presents with   Cardiac Arrest    Matthew Pratt is a 72 y.o. male.   Cardiac Arrest Patient presents after sudden cardiac arrest.  This was reportedly witnessed at home.  He reportedly [place]door to the fire department and fire came and performed CPR immediately.  Patient underwent an estimated 7 to 8 minutes of CPR and received 3 defibrillatory shocks.  ROSC was obtained prior to EMS arrival.  EMS noted spontaneous breathing but no other activity.  They did give fentanyl and Versed prior to arrival.  IVF given prior to arrival.  Patient maintained blood pressures in the range of 120 over 70s.  King airway was placed.  Per chart review, medical history includes OSA, depression, anxiety, IBS, CAD.     Home Medications Prior to Admission medications   Medication Sig Start Date End Date Taking? Authorizing Provider  acetaminophen (TYLENOL) 500 MG tablet Take 500 mg by mouth every 6 (six) hours as needed.    [provider]  aspirin EC 81 MG tablet Take 81 mg by mouth every morning.    [provider]  b complex vitamins capsule Take 1 capsule by mouth daily at 6 PM.    [provider]  calcium carbonate (OS-CAL) 600 MG TABS tablet Take 600 mg by mouth daily with breakfast. With 680 units of vitamin D    [provider]  Cholecalciferol (VITAMIN D3) 50 MCG (2000 UT) TABS Take 2,000 Units by mouth daily at 6 PM.    [provider]  Digestive Enzymes (SUPER ENZYMES PO) Take 1 capsule by mouth daily.    [provider]  EPINEPHrine 0.3 mg/0.3 mL IJ SOAJ injection Inject into the muscle as needed. 09/23/21   [provider]  latanoprost (XALATAN) 0.005 % ophthalmic solution Place 1 drop into both eyes at bedtime. 05/15/20   [provider]  nitroGLYCERIN  (NITROSTAT) 0.4 MG SL tablet Place 1 tablet (0.4 mg total) under the tongue every 5 (five) minutes as needed for chest pain. 02/23/22 05/24/22  Deberah Pelton, NP  omeprazole (PRILOSEC OTC) 20 MG tablet Take 20 mg by mouth daily at 6 PM.    [provider]  Selenium 100 MCG TABS Take 100 mcg by mouth daily.    [provider]  senna (SENOKOT) 8.6 MG tablet Take 1 tablet by mouth as needed for constipation.    [provider]  SYNTHROID 125 MCG tablet TAKE 1 TABLET BY MOUTH EVERY DAY 11/22/22   Burchette, Alinda Sierras, MD  timolol (TIMOPTIC) 0.5 % ophthalmic solution Place 1 drop into both eyes 2 (two) times daily.    [provider]  traZODone (DESYREL) 50 MG tablet TAKE 1/2 TO 1 TABLET BY MOUTH AT NIGHT AS NEEDED FOR INSOMNIA 12/28/21   Burchette, Alinda Sierras, MD  TURMERIC PO Take 1,500 mg by mouth daily.    [provider]  venlafaxine XR (EFFEXOR-XR) 37.5 MG 24 hr capsule TAKE 3 CAPSULES BY MOUTH EVERY DAY 10/18/22   Burchette, Alinda Sierras, MD  zinc gluconate 50 MG tablet Take 50 mg by mouth daily at 6 PM.    [provider]      Allergies    Bee venom, Crestor [rosuvastatin], Iodinated contrast media, Sulfa antibiotics, and Sulfonamide derivatives    Review of Systems  Review of Systems  Unable to perform ROS: Patient unresponsive    Physical Exam Updated Vital Signs BP 127/83   Pulse 68   Temp 98.4 F (36.9 C) (Temporal)   Resp (!) 23   SpO2 100%  Physical Exam Constitutional:      Appearance: He is normal weight. He is ill-appearing. He is not diaphoretic.  HENT:     Head: Normocephalic.     Comments: Abrasion to frontal scalp    Right Ear: External ear normal.     Left Ear: External ear normal.     Nose: Nose normal.     Mouth/Throat:     Mouth: Mucous membranes are moist.  Eyes:     Comments: No corneal reflex.  Pupils are 4 mm bilaterally and sluggishly reactive  Neck:     Comments: Cervical collar in place Cardiovascular:      Rate and Rhythm: Normal rate and regular rhythm.  Pulmonary:     Effort: No respiratory distress.     Breath sounds: No stridor. No wheezing or rhonchi.     Comments: Breathing assisted with King airway and BVM Abdominal:     General: There is no distension.     Palpations: Abdomen is soft.  Musculoskeletal:        General: No deformity.     Right lower leg: No edema.     Left lower leg: No edema.  Skin:    Coloration: Skin is pale.  Neurological:     GCS: GCS eye subscore is 1. GCS verbal subscore is 1. GCS motor subscore is 1.     ED Results / Procedures / Treatments   Labs (all labs ordered are listed, but only abnormal results are displayed) Labs Reviewed  CBC - Abnormal; Notable for the following components:      Result Value   MCV 104.0 (*)    MCH 35.7 (*)    All other components within normal limits  I-STAT CHEM 8, ED - Abnormal; Notable for the following components:   Creatinine, Ser 1.40 (*)    Glucose, Bld 105 (*)    Calcium, Ion 1.05 (*)    TCO2 19 (*)    All other components within normal limits  CULTURE, BLOOD (ROUTINE X 2)  CULTURE, BLOOD (ROUTINE X 2)  URINE CULTURE  CULTURE, RESPIRATORY W GRAM STAIN  APTT  PROTIME-INR  BLOOD GAS, ARTERIAL  COMPREHENSIVE METABOLIC PANEL  LACTIC ACID, PLASMA  LACTIC ACID, PLASMA  LACTIC ACID, PLASMA  LACTIC ACID, PLASMA  MAGNESIUM  PHOSPHORUS  RAPID URINE DRUG SCREEN, HOSP PERFORMED  PROCALCITONIN  TRIGLYCERIDES  URINALYSIS, ROUTINE W REFLEX MICROSCOPIC  BLOOD GAS, ARTERIAL  BLOOD GAS, ARTERIAL  CBG MONITORING, ED  TYPE AND SCREEN  TROPONIN I (HIGH SENSITIVITY)    EKG EKG Interpretation  Date/Time:  Wednesday December 15 2022 13:01:32 EST Ventricular Rate:  90 PR Interval:  177 QRS Duration: 96 QT Interval:  408 QTC Calculation: 500 R Axis:   -13 Text Interpretation: Incomplete analysis due to missing data in precordial lead(s) Sinus rhythm Borderline low voltage, extremity leads Minimal ST  depression, anterolateral leads Borderline prolonged QT interval Missing lead(s): V4 Confirmed by Godfrey Pick [phone]) on 12/15/2022 1:10:09 PM  Radiology DG Chest Port 1 View  Result Date: 12/15/2022 CLINICAL DATA:  ETT placement. EXAM: PORTABLE CHEST 1 VIEW COMPARISON:  Chest radiograph 05/05/2022 FINDINGS: The endotracheal tube tip is approximately 6.0 cm from the carina at the level of the clavicular heads. The enteric catheter  tip is in the stomach. The cardiomediastinal silhouette is normal. There is no focal consolidation or pulmonary edema. There is no pleural effusion or pneumothorax There is no acute osseous abnormality. IMPRESSION: 1. Endotracheal tube tip at the level of the clavicular heads approximately 6.0 cm from the carina. 2. No focal airspace disease or pleural effusion. Electronically Signed   By: Valetta Mole M.D.   On: 12/15/2022 13:23   DG Abd Portable 1V  Result Date: 12/15/2022 CLINICAL DATA:  Nasal/orogastric tube placement. EXAM: PORTABLE ABDOMEN - 1 VIEW COMPARISON:  CT, 06/27/2020.  Current chest radiograph. FINDINGS: Nasal/orogastric tube passes below the diaphragm, tip in the distal stomach. Normal bowel gas pattern. IMPRESSION: Well-positioned nasal/orogastric tube. Electronically Signed   By: Lajean Manes M.D.   On: 12/15/2022 13:22    Procedures Procedure Name: Intubation Date/Time: 12/15/2022 1:30 PM  Performed by: Godfrey Pick, MDPre-anesthesia Checklist: Patient identified, Emergency Drugs available, Patient being monitored and Suction available Oxygen Delivery Method: Ambu bag Preoxygenation: Pre-oxygenation with 100% oxygen Laryngoscope Size: Glidescope Grade View: Grade III Tube size: 7.5 mm Number of attempts: 1 Airway Equipment and Method: Rigid stylet and Video-laryngoscopy Placement Confirmation: ETT inserted through vocal cords under direct vision, Positive ETCO2, CO2 detector and Breath sounds checked- equal and bilateral Secured at: 24 cm Tube  secured with: ETT holder Dental Injury: Teeth and Oropharynx as per pre-operative assessment         Medications Ordered in ED Medications  propofol (DIPRIVAN) 1000 MG/100ML infusion (has no administration in time range)  norepinephrine (LEVOPHED) '4mg'$  in 245m (0.016 mg/mL) premix infusion (has no administration in time range)  fentaNYL (SUBLIMAZE) 100 MCG/2ML injection (  Not Given 12/15/22 1306)  fentaNYL 25075m in NS 25032m1m49ml) infusion-PREMIX (25 mcg/hr Intravenous New Bag/Given 12/15/22 1320)  fentaNYL (SUBLIMAZE) bolus via infusion 25-100 mcg (has no administration in time range)  midazolam (VERSED) 100 mg/100 mL (1 mg/mL) premix infusion (2 mg/hr Intravenous New Bag/Given 12/15/22 1322)  midazolam (VERSED) bolus via infusion 0-5 mg (has no administration in time range)  methylPREDNISolone sodium succinate (SOLU-MEDROL) 40 mg/mL injection 40 mg (has no administration in time range)  diphenhydrAMINE (BENADRYL) injection 50 mg (has no administration in time range)  EPINEPHrine (ADRENALIN) 5 mg in NS 250 mL (0.02 mg/mL) premix infusion (4 mcg/min Intravenous Rate/Dose Change 12/15/22 1335)  propofol (DIPRIVAN) 1000 MG/100ML infusion (has no administration in time range)  docusate (COLACE) 50 MG/5ML liquid 100 mg (has no administration in time range)  polyethylene glycol (MIRALAX / GLYCOLAX) packet 17 g (has no administration in time range)  ondansetron (ZOFRAN) injection 4 mg (has no administration in time range)  acetaminophen (TYLENOL) tablet 650 mg (has no administration in time range)    Or  acetaminophen (TYLENOL) 160 MG/5ML solution 650 mg (has no administration in time range)    Or  acetaminophen (TYLENOL) suppository 650 mg (has no administration in time range)  acetaminophen (TYLENOL) tablet 650 mg (has no administration in time range)    Or  acetaminophen (TYLENOL) 160 MG/5ML solution 650 mg (has no administration in time range)    Or  acetaminophen (TYLENOL)  suppository 650 mg (has no administration in time range)  busPIRone (BUSPAR) tablet 30 mg (has no administration in time range)    Or  busPIRone (BUSPAR) tablet 30 mg (has no administration in time range)  magnesium sulfate IVPB 2 g 50 mL (has no administration in time range)  norepinephrine (LEVOPHED) '4mg'$  in 250mL75m016 mg/mL) premix infusion (has no administration in  time range)  0.9 %  sodium chloride infusion (has no administration in time range)  famotidine (PEPCID) tablet 20 mg (has no administration in time range)  fentaNYL (SUBLIMAZE) injection 25 mcg (has no administration in time range)  fentaNYL 2556mg in NS 2561m(1012mml) infusion-PREMIX (has no administration in time range)  fentaNYL (SUBLIMAZE) bolus via infusion 25-100 mcg (has no administration in time range)  lactated ringers bolus 1,000 mL (1,000 mLs Intravenous New Bag/Given 12/15/22 1309)  fentaNYL (SUBLIMAZE) injection 100 mcg (100 mcg Intravenous Given 12/15/22 1303)  fentaNYL (SUBLIMAZE) injection 25 mcg (25 mcg Intravenous Bolus 12/15/22 1328)    ED Course/ Medical Decision Making/ A&P                           Medical Decision Making Amount and/or Complexity of Data Reviewed Labs: ordered. Radiology: ordered.  Risk Prescription drug management. Decision regarding hospitalization.   This patient presents to the ED for concern of cardiac arrest, this involves an extensive number of treatment options, and is a complaint that carries with it a high risk of complications and morbidity.  The differential diagnosis includes MI, PE, respiratory arrest, aortic dissection, pericardial effusion, pneumothorax, arrhythmia   Co morbidities that complicate the patient evaluation  OSA, depression, anxiety, IBS, CAD   Additional history obtained:  Additional history obtained from EMS External records from outside source obtained and reviewed including EMR   Lab Tests:  I Ordered, and personally interpreted labs.   The pertinent results include: Hemoglobin, no leukocytosis, increased creatinine from baseline on i-STAT, normal electrolytes   Imaging Studies ordered:  I ordered imaging studies including chest x-ray, CT head, CT cervical spine, CTA dissection study I independently visualized and interpreted imaging which showed no acute findings on chest x-ray, remaining studies pending at time of admission I agree with the radiologist interpretation   Cardiac Monitoring: / EKG:  The patient was maintained on a cardiac monitor.  I personally viewed and interpreted the cardiac monitored which showed an underlying rhythm of: Sinus rhythm   Consultations Obtained:  I requested consultation with the critical care,  and discussed lab and imaging findings as well as pertinent plan - they recommend: Admission to ICU   Problem List / ED Course / Critical interventions / Medication management  Patient presents after a cardiac arrest.  This was reportedly witnessed at his home and he was able to get rapid CPR from personnel at fire department next-door to him.  He did receive 3 defibrillatory shocks.  ROSC was obtained prior to arrival.  Patient remained unresponsive during transit.  He did have spontaneous breathing.  A King airway was placed and breathing was assisted with BVM.  On arrival, patient does have reactive pupils.  He does not appear to have a corneal reflex.  He is a GCS of 3.  Patient was intubated without RSI medications.  On intubation, he did have a gag reflex and was subsequently biting down on ETT.  100 mcg of fentanyl was ordered.  Postintubation sedation was ordered.  On EKG, he has some mild anterior ST segment depressions but EKG not consistent with STEMI.  Patient was initially normotensive.  Following intubation, blood pressures decreased.  Epinephrine gtt. was ordered.  Chest x-ray shows no pneumothorax and no widened mediastinum.  Given the unclear reason of his arrest, in addition to  evidence of recent trauma which I suspect is from his fall at time of arrest, patient to undergo  further imaging studies.  Patient was admitted to the ICU for further management. I ordered medication including fentanyl and Versed for sedation; epinephrine for hypotension Reevaluation of the patient after these medicines showed that the patient improved I have reviewed the patients home medicines and have made adjustments as needed   Social Determinants of Health:  Has access to outpatient care  CRITICAL CARE Performed by: Godfrey Pick   Total critical care time: 32 minutes  Critical care time was exclusive of separately billable procedures and treating other patients.  Critical care was necessary to treat or prevent imminent or life-threatening deterioration.  Critical care was time spent personally by me on the following activities: development of treatment plan with patient and/or surrogate as well as nursing, discussions with consultants, evaluation of patient's response to treatment, examination of patient, obtaining history from patient or surrogate, ordering and performing treatments and interventions, ordering and review of laboratory studies, ordering and review of radiographic studies, pulse oximetry and re-evaluation of patient's condition.          Final Clinical Impression(s) / ED Diagnoses Final diagnoses:  Cardiac arrest [facility])    Rx / DC Orders ED Discharge Orders     None         Godfrey Pick, MD 12/15/22 1340

## 2022-12-15 NOTE — Plan of Care (Signed)
   Patient has a documented allergy to iodinated contrast media, reaction listed as hives, itching. Patient has already received benadryl 50 mg IV at 1527 and solumedrol 40 mg at 1357. Prior to cath, I have ordered an additional 80 mg of solumedrol.   Margie Billet, PA-C 12/15/2022 4:10 PM

## 2022-12-15 NOTE — Progress Notes (Signed)
Pt headed to a head CT, check back at 3pm per MD

## 2022-12-15 NOTE — Procedures (Signed)
Arterial Catheter Insertion Procedure Note  Matthew Pratt  [id number]  06/02/1951  Date:12/15/22  Time:8:27 PM    Provider Performing: Lacretia Nicks    Procedure: Insertion of Arterial Line [phone]) without US guidance  Indication(s) Blood pressure monitoring and/or need for frequent ABGs  Consent Unable to obtain consent due to inability to find a medical decision maker for patient.  All reasonable efforts were made.  Another independent medical provider, Dr. Ellyn Hack , confirmed the benefits of this procedure outweigh the risks.  Anesthesia None   Time Out Verified patient identification, verified procedure, site/side was marked, verified correct patient position, special equipment/implants available, medications/allergies/relevant history reviewed, required imaging and test results available.   Sterile Technique Maximal sterile technique including full sterile barrier drape, hand hygiene, sterile gown, sterile gloves, mask, hair covering, sterile ultrasound probe cover (if used).   Procedure Description Area of catheter insertion was cleaned with chlorhexidine and draped in sterile fashion. Without real-time ultrasound guidance an arterial catheter was placed into the left radial artery.  Appropriate arterial tracings confirmed on monitor.     Complications/Tolerance None; patient tolerated the procedure well.   EBL Minimal   Specimen(s) None  Normal saline flush used. Line secured per RT protocol. Leveled and zeroed, good waveform and blood return. Patient tolerated well.

## 2022-12-15 NOTE — Progress Notes (Signed)
Patient maintained on mechanical ventilation in the Cath lab for procedure and then transported to [place] without incident.

## 2022-12-15 NOTE — H&P (Signed)
NAME:  Matthew Pratt, MRN:  [id number], DOB:  May 17, 1951, LOS: 0 ADMISSION DATE:  12/15/2022, CONSULTATION DATE:  12/15/21 REFERRING MD:  EDP, CHIEF COMPLAINT:  arrest   History of Present Illness:  72 year old man w/ hx of CAD, prior STEMI, HLD, GERD, OSA, hypothyroidism, GB issues presenting with witnessed OOH cardiac arrest.  +fall witnessed by neighbor.  Unclear initial rhtyhm but did get 3 AED shocks.  Total arrest time x 8-9 minutes.  Breathing on ROSC but not following commands.  Labs/imaging pending.  EKG no STEMI.  CCM to admit.  Pertinent  Medical History   Past Medical History:  Diagnosis Date   Anxiety    Arthritis    BPH (benign prostatic hypertrophy)    CAD (coronary artery disease)    s/p STEMI 10/15/10 w/ Promus DES placed x1 in the first OM   Colon polyps    Complication of anesthesia    pt. reported 2015 penile inplant when getting some anesthesia had chest pain,they readjusted  the meds. and continued with surgery. Had no further problems di not have to follow up with anyone.   Depression    Depression with anxiety    Dupuytren's contracture of hand    ED (erectile dysfunction)    GERD (gastroesophageal reflux disease)    Glaucoma    HLD (hyperlipidemia)    Hypocontractile bladder    Hypothyroidism    MI (myocardial infarction) ([place])    denies   OSA on CPAP    Peyronie's disease    Rosacea    Skin cancer    BCC     Significant Hospital Events: Including procedures, antibiotic start and stop dates in addition to other pertinent events   1/10 admit, king exchanged for ETT  Interim History / Subjective:  Consulted  Objective   Blood pressure (!) 129/99, pulse 72, temperature 98.4 F (36.9 C), temperature source Temporal, resp. rate 19, SpO2 100 %.    Vent Mode: PRVC FiO2 (%):  [100 %] 100 % Set Rate:  [24 bmp] 24 bmp Vt Set:  [630 mL] 630 mL PEEP:  [5 cmH20] 5 cmH20 Plateau Pressure:  [15 cmH20] 15 cmH20  No intake or output data in the 24  hours ending 12/15/22 1325 There were no vitals filed for this visit.  Examination: General: elderly man in NAD HENT: ETT in place, minimal secretions Lungs: + bilateral breath sounds, occasionally triggering vent Cardiovascular: ext warm, heart sounds regular Abdomen: soft, hypoactive BS Extremities: no edema or cyanosis Neuro: some shivering, +weak gag; no other response to pain; pupils dilated but reactive GU: foley to be placed  CXR looks clear by my read  Resolved Hospital Problem list   N/A  Assessment & Plan:  OHCA- CTA chest and echo pending; initial trop not c/w STEMI  Post arrest encephalopathy- meets criteria for TTM  CAD, prior STEMI, HLD, GERD, OSA, hypothyroidism, GB issues  - Echo, CT head, CT neck, CTA chest f/u - F/u admit labs - Vent bundle - TTM with high sedation for usual 24h then wean - Called sister who is en route  Best Practice (right click and "Reselect all SmartList Selections" daily)   Diet/type: NPO DVT prophylaxis: other pending CT head GI prophylaxis: H2B Lines: N/A Foley:  Yes, and it is still needed Code Status:  full code Last date of multidisciplinary goals of care discussion [Sister en route]  Labs   CBC: Recent Labs  Lab 12/15/22 1317  HGB 15.3  HCT  27.0    Basic Metabolic Panel: Recent Labs  Lab 12/15/22 1317  NA 137  K 4.4  CL 103  GLUCOSE 105*  BUN 23  CREATININE 1.40*   GFR: Estimated Creatinine Clearance: 53.1 mL/min (A) (by C-G formula based on SCr of 1.4 mg/dL (H)). No results for input(s): "PROCALCITON", "WBC", "LATICACIDVEN" in the last 168 hours.  Liver Function Tests: No results for input(s): "AST", "ALT", "ALKPHOS", "BILITOT", "PROT", "ALBUMIN" in the last 168 hours. No results for input(s): "LIPASE", "AMYLASE" in the last 168 hours. No results for input(s): "AMMONIA" in the last 168 hours.  ABG    Component Value Date/Time   TCO2 19 (L) 12/15/2022 1317     Coagulation Profile: No results for  input(s): "INR", "PROTIME" in the last 168 hours.  Cardiac Enzymes: No results for input(s): "CKTOTAL", "CKMB", "CKMBINDEX", "TROPONINI" in the last 168 hours.  HbA1C: Hemoglobin A1C  Date/Time Value Ref Range Status  09/30/2021 12:39 PM 5.4 4.0 - 5.6 % Final  04/29/2021 11:36 AM 5.4 4.0 - 5.6 % Final   Hgb A1c MFr Bld  Date/Time Value Ref Range Status  08/13/2022 09:22 AM 5.6 4.6 - 6.5 % Final    Comment:    Glycemic Control Guidelines for People with Diabetes:Non Diabetic:  <6%Goal of Therapy: <7%Additional Action Suggested:  >8%   06/29/2020 11:55 AM 5.6 4.8 - 5.6 % Final    Comment:    (NOTE) Pre diabetes:          5.7%-6.4%  Diabetes:              >6.4%  Glycemic control for   <7.0% adults with diabetes     CBG: No results for input(s): "GLUCAP" in the last 168 hours.  Review of Systems:   Intubated/comatose  Past Medical History:  He,  has a past medical history of Anxiety, Arthritis, BPH (benign prostatic hypertrophy), CAD (coronary artery disease), Colon polyps, Complication of anesthesia, Depression, Depression with anxiety, Dupuytren's contracture of hand, ED (erectile dysfunction), GERD (gastroesophageal reflux disease), Glaucoma, HLD (hyperlipidemia), Hypocontractile bladder, Hypothyroidism, MI (myocardial infarction) ([place]), OSA on CPAP, Peyronie's disease, Rosacea, and Skin cancer.   Surgical History:   Past Surgical History:  Procedure Laterality Date   CHOLECYSTECTOMY N/A 02/10/2021   Procedure: LAPAROSCOPIC CHOLECYSTECTOMY;  Surgeon: Coralie Keens, MD;  Location: [facility];  Service: General;  Laterality: N/A;   COLONOSCOPY WITH ESOPHAGOGASTRODUODENOSCOPY (EGD)     multiple   CORONARY STENT PLACEMENT     INNER EAR SURGERY     LAPAROSCOPIC CHOLECYSTECTOMY Bilateral 02/09/2021   Dr.blackmon   LASIK  02/1998   PENILE PROSTHESIS IMPLANT     TONSILECTOMY, ADENOIDECTOMY, BILATERAL MYRINGOTOMY AND TUBES     TONSILLECTOMY     VASECTOMY     WISDOM TOOTH  EXTRACTION       Social History:   reports that he has never smoked. He has never used smokeless tobacco. He reports that he does not drink alcohol and does not use drugs.   Family History:  His family history includes Arthritis in his paternal grandfather; Basal cell carcinoma in his father and sister; COPD in his mother and another family member; Cancer in his father; Colon cancer in his paternal grandfather and paternal grandmother; Heart failure in his maternal grandfather; Liver cancer in his father; Melanoma in his sister; Rheum arthritis in his maternal grandmother; Varicose Veins in his father.   Allergies Allergies  Allergen Reactions   Bee Venom Hives and Swelling   Crestor [Rosuvastatin]  Other (See Comments)    Myalgia.     Iodinated Contrast Media Hives and Itching   Sulfa Antibiotics Rash   Sulfonamide Derivatives Rash     Home Medications  Prior to Admission medications   Medication Sig Start Date End Date Taking? Authorizing Provider  acetaminophen (TYLENOL) 500 MG tablet Take 500 mg by mouth every 6 (six) hours as needed.    [provider]  aspirin EC 81 MG tablet Take 81 mg by mouth every morning.    [provider]  b complex vitamins capsule Take 1 capsule by mouth daily at 6 PM.    [provider]  calcium carbonate (OS-CAL) 600 MG TABS tablet Take 600 mg by mouth daily with breakfast. With 680 units of vitamin D    [provider]  Cholecalciferol (VITAMIN D3) 50 MCG (2000 UT) TABS Take 2,000 Units by mouth daily at 6 PM.    [provider]  Digestive Enzymes (SUPER ENZYMES PO) Take 1 capsule by mouth daily.    [provider]  EPINEPHrine 0.3 mg/0.3 mL IJ SOAJ injection Inject into the muscle as needed. 09/23/21   [provider]  latanoprost (XALATAN) 0.005 % ophthalmic solution Place 1 drop into both eyes at bedtime. 05/15/20   [provider]  nitroGLYCERIN (NITROSTAT) 0.4 MG SL tablet  Place 1 tablet (0.4 mg total) under the tongue every 5 (five) minutes as needed for chest pain. 02/23/22 05/24/22  Deberah Pelton, NP  omeprazole (PRILOSEC OTC) 20 MG tablet Take 20 mg by mouth daily at 6 PM.    [provider]  Selenium 100 MCG TABS Take 100 mcg by mouth daily.    [provider]  senna (SENOKOT) 8.6 MG tablet Take 1 tablet by mouth as needed for constipation.    [provider]  SYNTHROID 125 MCG tablet TAKE 1 TABLET BY MOUTH EVERY DAY 11/22/22   Burchette, Alinda Sierras, MD  timolol (TIMOPTIC) 0.5 % ophthalmic solution Place 1 drop into both eyes 2 (two) times daily.    [provider]  traZODone (DESYREL) 50 MG tablet TAKE 1/2 TO 1 TABLET BY MOUTH AT NIGHT AS NEEDED FOR INSOMNIA 12/28/21   Burchette, Alinda Sierras, MD  TURMERIC PO Take 1,500 mg by mouth daily.    [provider]  venlafaxine XR (EFFEXOR-XR) 37.5 MG 24 hr capsule TAKE 3 CAPSULES BY MOUTH EVERY DAY 10/18/22   Burchette, Alinda Sierras, MD  zinc gluconate 50 MG tablet Take 50 mg by mouth daily at 6 PM.    [provider]     Critical care time: 34 mins independent of procedures

## 2022-12-15 NOTE — ED Notes (Signed)
janelle (sister in law) called asking for an update. Her number is [phone].

## 2022-12-15 NOTE — Progress Notes (Signed)
Echocardiogram 2D Echocardiogram has been performed.  Matthew Pratt 12/15/2022, 2:03 PM

## 2022-12-15 NOTE — ED Notes (Signed)
Son Yvone Neu would like an update [phone]

## 2022-12-15 NOTE — Progress Notes (Signed)
Patient transported to Cath lab from ED. RT handed off to CATH lab RT.

## 2022-12-15 NOTE — Progress Notes (Addendum)
eLink Physician-Brief Progress Note Patient Name: Matthew Pratt DOB: Jul 17, 1951 MRN: [id number]   Date of Service  12/15/2022  HPI/Events of Note  Multiple issues: 1. Nursing request to change Norepinephrine IV infusion order to via central line. 2. Lactic Acid > 9.0 and Troponin = 910. BP now = 111/53 with MAP = 66 by A-line. Likely related to Cardiac Arrest/CPR/hypotension/Demand ischemia. Cardiac cath revealed non critical disease.    eICU Interventions  Plan: Will change Norepinephrine IV infusion orders to via central line.  Continue to trend Lactic Acid now the perfusion improved.      Intervention Category Major Interventions: Acid-Base disturbance - evaluation and management;Other:  Aldan Camey Cornelia Copa 12/15/2022, 11:09 PM

## 2022-12-15 NOTE — Progress Notes (Signed)
eLink Physician-Brief Progress Note Patient Name: Matthew Pratt DOB: February 02, 1951 MRN: [id number]   Date of Service  12/15/2022  HPI/Events of Note  Hemodynamics marginal in cath lab by arterial catheter. Invasive cardiologist can not convert 6 Fr. Cath lab arterial catheter to A-line.   eICU Interventions  Plan: RT to place A-line when patient returns to ICU.      Intervention Category Major Interventions: Other:  Lysle Dingwall 12/15/2022, 7:23 PM

## 2022-12-15 NOTE — ED Triage Notes (Signed)
Pt BIB EMS due to post arrest. Pt had a witnessed arrest, pt [place] fire station, CPR started immediately. CPR for 8 min, 3 shiovcks. ROSC achieved when EMS arrived. Laceration on top pf pts head . EMS gave 50 mcgs of fentanyl and 400 ML of NS.

## 2022-12-15 NOTE — Progress Notes (Signed)
Pt transported to CT and back to TRA A without any complications.

## 2022-12-15 NOTE — Consult Note (Addendum)
Cardiology Consultation   Patient ID: Trace Wirick Fulgham MRN: [id number]; DOB: May 08, 1951  Admit date: 12/15/2022 Date of Consult: 12/15/2022  PCP:  [name], MD   [facility] Cardiologist:  Elouise Munroe, MD     Patient Profile:   Marcas Bowsher Cisse is a 72 y.o. male with a hx of STEMI in 2011 (treated with DES x1 in OM1), BPH, HLD, OSA on CPAP who is being seen 12/15/2022 for the evaluation of cardiac arrest at the request of Dr. Tamala Julian.  History of Present Illness:   Mr. Ericsson is a 72 year old male with above medical history who is followed by Dr. Margaretann Loveless.  Per chart review, patient had a STEMI in 2011 and was treated with DES x 1 and OM 1. Patient later underwent exercise treadmill stress test in 2016, during which EKG was negative for ischemia. Most recent echocardiogram in 01/2021 showed EF 55-60%, no regional wall motion abnormalities, mild LVH, normal RV systolic function.  Patient was seen by Dr. Margaretann Loveless the morning of 1/10 for follow-up evaluation.  At that appointment, patient denied chest pain or recent falls.  Did have some imbalance when turning rapidly, however he was always able to catch himself.  He had only taken 1 nitroglycerin in the past year.  He also denies chest pressure, dyspnea at rest or with exertion, palpitations, PND, orthopnea, leg swelling.  After his appointment, patient returned home where he had a written list cardiac arrest.  Patient lives beside the fire station, and firefighter saw him pass out.  They started CPR immediately.  Did CPR for 8 minutes and delivered 3 defibrillatory shocks.  By the time EMS had arrived, ROSC had been achieved.  There was a laceration noted on top of patient's head. EMS noted spontaneous breathing, but no other activity. He was intubated in the ER. During intubation, noted that patient had a gag reflex and was subsequently biting down on ETT. Patient was started on fentanyl and versed for  sedation, and epinephrine for hypotension. Patient was admitted to the critical care service for further management.   EKG showed minimal ST depression in leads V2, V3 (note, V4 missing from EKG).  Initial hsTn 60.  Echocardiogram showed EF 55% with regional wall motion abnormalities (posterior wall is hypokinetic), mildly reduced RV systolic function.     Past Medical History:  Diagnosis Date   Anxiety    Arthritis    BPH (benign prostatic hypertrophy)    CAD (coronary artery disease)    s/p STEMI 10/15/10 w/ Promus DES placed x1 in the first OM   Colon polyps    Complication of anesthesia    pt. reported 2015 penile inplant when getting some anesthesia had chest pain,they readjusted  the meds. and continued with surgery. Had no further problems di not have to follow up with anyone.   Depression    Depression with anxiety    Dupuytren's contracture of hand    ED (erectile dysfunction)    GERD (gastroesophageal reflux disease)    Glaucoma    HLD (hyperlipidemia)    Hypocontractile bladder    Hypothyroidism    MI (myocardial infarction) ([place])    denies   OSA on CPAP    Peyronie's disease    Rosacea    Skin cancer    BCC    Past Surgical History:  Procedure Laterality Date   CHOLECYSTECTOMY N/A 02/10/2021   Procedure: LAPAROSCOPIC CHOLECYSTECTOMY;  Surgeon: Coralie Keens, MD;  Location: [facility];  Service: General;  Laterality: N/A;   COLONOSCOPY WITH ESOPHAGOGASTRODUODENOSCOPY (EGD)     multiple   CORONARY STENT PLACEMENT     INNER EAR SURGERY     LAPAROSCOPIC CHOLECYSTECTOMY Bilateral 02/09/2021   Dr.blackmon   LASIK  02/1998   PENILE PROSTHESIS IMPLANT     TONSILECTOMY, ADENOIDECTOMY, BILATERAL MYRINGOTOMY AND TUBES     TONSILLECTOMY     VASECTOMY     WISDOM TOOTH EXTRACTION       Home Medications:  Prior to Admission medications   Medication Sig Start Date End Date Taking? Authorizing Provider  acetaminophen (TYLENOL) 500 MG tablet Take 500 mg by mouth every  6 (six) hours as needed.    [provider]  aspirin EC 81 MG tablet Take 81 mg by mouth every morning.    [provider]  b complex vitamins capsule Take 1 capsule by mouth daily at 6 PM.    [provider]  calcium carbonate (OS-CAL) 600 MG TABS tablet Take 600 mg by mouth daily with breakfast. With 680 units of vitamin D    [provider]  Cholecalciferol (VITAMIN D3) 50 MCG (2000 UT) TABS Take 2,000 Units by mouth daily at 6 PM.    [provider]  Digestive Enzymes (SUPER ENZYMES PO) Take 1 capsule by mouth daily.    [provider]  EPINEPHrine 0.3 mg/0.3 mL IJ SOAJ injection Inject into the muscle as needed. 09/23/21   [provider]  latanoprost (XALATAN) 0.005 % ophthalmic solution Place 1 drop into both eyes at bedtime. 05/15/20   [provider]  nitroGLYCERIN (NITROSTAT) 0.4 MG SL tablet Place 1 tablet (0.4 mg total) under the tongue every 5 (five) minutes as needed for chest pain. 02/23/22 05/24/22  Deberah Pelton, NP  omeprazole (PRILOSEC OTC) 20 MG tablet Take 20 mg by mouth daily at 6 PM.    [provider]  Selenium 100 MCG TABS Take 100 mcg by mouth daily.    [provider]  senna (SENOKOT) 8.6 MG tablet Take 1 tablet by mouth as needed for constipation.    [provider]  SYNTHROID 125 MCG tablet TAKE 1 TABLET BY MOUTH EVERY DAY 11/22/22   Burchette, Alinda Sierras, MD  timolol (TIMOPTIC) 0.5 % ophthalmic solution Place 1 drop into both eyes 2 (two) times daily.    [provider]  traZODone (DESYREL) 50 MG tablet TAKE 1/2 TO 1 TABLET BY MOUTH AT NIGHT AS NEEDED FOR INSOMNIA 12/28/21   Burchette, Alinda Sierras, MD  TURMERIC PO Take 1,500 mg by mouth daily.    [provider]  venlafaxine XR (EFFEXOR-XR) 37.5 MG 24 hr capsule TAKE 3 CAPSULES BY MOUTH EVERY DAY 10/18/22   Burchette, Alinda Sierras, MD  zinc gluconate 50 MG tablet Take 50 mg by mouth daily at 6 PM.    [provider]    Inpatient Medications: Scheduled Meds:  acetaminophen  650 mg Oral Q4H   Or   acetaminophen (TYLENOL) oral liquid 160 mg/5 mL  650 mg Per Tube Q4H   Or   acetaminophen  650 mg Rectal Q4H   diphenhydrAMINE  50 mg Intravenous Once   docusate  100 mg Per Tube BID   famotidine  20 mg Per Tube BID   fentaNYL       fentaNYL (SUBLIMAZE) injection  25 mcg Intravenous Once   polyethylene glycol  17 g Per Tube Daily   Continuous Infusions:  sodium chloride  epinephrine 4 mcg/min (12/15/22 1335)   fentaNYL infusion INTRAVENOUS     magnesium sulfate     midazolam 2 mg/hr (12/15/22 1322)   norepinephrine (LEVOPHED) Adult infusion     propofol (DIPRIVAN) infusion     PRN Meds: [START ON 12/17/2022] acetaminophen **OR** [START ON 12/17/2022] acetaminophen (TYLENOL) oral liquid 160 mg/5 mL **OR** [START ON 12/17/2022] acetaminophen, busPIRone **OR** busPIRone, fentaNYL, fentaNYL, fentaNYL, magnesium sulfate, midazolam, ondansetron (ZOFRAN) IV  Allergies:    Allergies  Allergen Reactions   Bee Venom Hives and Swelling   Crestor [Rosuvastatin] Other (See Comments)    Myalgia.     Iodinated Contrast Media Hives and Itching   Sulfa Antibiotics Rash   Sulfonamide Derivatives Rash    Social History:   Social History   Socioeconomic History   Marital status: Divorced    Spouse name: Not on file   Number of children: 2   Years of education: Not on file   Highest education level: Bachelor's degree (e.g., BA, AB, BS)  Occupational History   Occupation: International aid/development worker    Comment: retired  Tobacco Use   Smoking status: Never   Smokeless tobacco: Never  Vaping Use   Vaping Use: Never used  Substance and Sexual Activity   Alcohol use: No   Drug use: No   Sexual activity: Not on file  Other Topics Concern   Not on file  Social History Narrative   Divorced, 2 children   Retired Chief Financial Officer   No EtOH, tobacco, drugs   Social Determinants of Health    Financial Resource Strain: Medium Risk (04/19/2022)   Overall Financial Resource Strain (CARDIA)    Difficulty of Paying Living Expenses: Somewhat hard  Food Insecurity: No Food Insecurity (04/19/2022)   Hunger Vital Sign    Worried About Running Out of Food in the Last Year: Never true    Ran Out of Food in the Last Year: Never true  Transportation Needs: No Transportation Needs (08/23/2022)   PRAPARE - Hydrologist (Medical): No    Lack of Transportation (Non-Medical): No  Physical Activity: Inactive (04/19/2022)   Exercise Vital Sign    Days of Exercise per Week: 0 days    Minutes of Exercise per Session: 0 min  Stress: Stress Concern Present (04/19/2022)   [place]    Feeling of Stress : Very much  Social Connections: Moderately Isolated (04/19/2022)   Social Connection and Isolation Panel [NHANES]    Frequency of Communication with Friends and Family: Never    Frequency of Social Gatherings with Friends and Family: Once a week    Attends Religious Services: More than 4 times per year    Active Member of [employer] or Organizations: Yes    Attends Music therapist: More than 4 times per year    Marital Status: Divorced  Intimate Partner Violence: Not At Risk (04/19/2022)   Humiliation, Afraid, Rape, and Kick questionnaire    Fear of Current or Ex-Partner: No    Emotionally Abused: No    Physically Abused: No    Sexually Abused: No    Family History:    Family History  Problem Relation Age of Onset   COPD Mother    Cancer Father        gallbladder cancer   Varicose Veins Father    Liver cancer Father    Basal cell carcinoma Father    Basal cell carcinoma  Sister    Melanoma Sister    Rheum arthritis Maternal Grandmother    Heart failure Maternal Grandfather    Colon cancer Paternal Grandmother    Arthritis Paternal Grandfather    Colon cancer Paternal Grandfather     COPD Other      ROS:  Please see the history of present illness.   All other ROS reviewed and negative.     Physical Exam/Data:   Vitals:   12/15/22 1313 12/15/22 1315 12/15/22 1325 12/15/22 1501  BP:  97/62 127/83   Pulse:   68   Resp:  (!) 21 (!) 23   Temp:      TempSrc:      SpO2: 100%  100% 100%    Intake/Output Summary (Last 24 hours) at 12/15/2022 1509 Last data filed at 12/15/2022 1455 Gross per 24 hour  Intake 8.98 ml  Output --  Net 8.98 ml      12/15/2022    9:01 AM 08/13/2022    8:27 AM 05/11/2022   10:49 AM  Last 3 Weights  Weight (lbs) 190 lb 190 lb 6.4 oz 218 lb 6.4 oz  Weight (kg) 86.183 kg 86.365 kg 99.066 kg     There is no height or weight on file to calculate BMI.  General:  Elderly male. Laying in the bed with head elevated. Intubated and sedated  HEENT: normal Neck: no JVD with head elevated  Vascular: Radial pulses 2+ bilaterally Cardiac:  normal S1, S2; RRR; no murmur noted  Lungs:  anterior lung exam clear to auscultation  Abd: soft Ext: no edema in BLE  Musculoskeletal:  No deformities, BUE and BLE strength normal and equal Skin: warm and dry  Neuro:  CNs 2-12 intact, no focal abnormalities noted Psych:  Normal affect   EKG:  The EKG was personally reviewed and demonstrates: nsr, minimal ST depression in leads V2, V3 (note, V4 missing from EKG) Telemetry:  Telemetry was personally reviewed and demonstrates:  NSR, PVCs   Relevant CV Studies:  Echocardiogram 12/15/22 1. Left ventricular ejection fraction, by estimation, is 55%. The left  ventricle has low normal function. The left ventricle demonstrates  regional wall motion abnormalities (see scoring diagram/findings for  description). Left ventricular diastolic  parameters were normal.   2. Right ventricular systolic function is mildly reduced. The right  ventricular size is mildly enlarged. Tricuspid regurgitation signal is  inadequate for assessing PA pressure.   3. Right atrial size  was mildly dilated.   4. The mitral valve is grossly normal. Mild mitral valve regurgitation.  No evidence of mitral stenosis.   5. The aortic valve is grossly normal. There is mild thickening of the  aortic valve. Aortic valve regurgitation is not visualized. No aortic  stenosis is present.   6. The inferior vena cava is normal in size with greater than 50%  respiratory variability, suggesting right atrial pressure of 3 mmHg.   Laboratory Data:  High Sensitivity Troponin:   Recent Labs  Lab 12/15/22 1310  TROPONINIHS 60*     Chemistry Recent Labs  Lab 12/15/22 1310 12/15/22 1317 12/15/22 1455  NA 134* 137 138  K 4.3 4.4 3.6  CL 100 103  --   CO2 19*  --   --   GLUCOSE 107* 105*  --   BUN 18 23  --   CREATININE 1.55* 1.40*  --   CALCIUM 8.8*  --   --   MG 2.0  --   --  GFRNONAA 47*  --   --   ANIONGAP 15  --   --     Recent Labs  Lab 12/15/22 1310  PROT 6.7  ALBUMIN 3.8  AST 83*  ALT 82*  ALKPHOS 80  BILITOT 1.3*   Lipids  Recent Labs  Lab 12/15/22 1310  TRIG 111    Hematology Recent Labs  Lab 12/15/22 1310 12/15/22 1317 12/15/22 1455  WBC 10.2  --   --   RBC 4.28  --   --   HGB 15.3 15.3 13.9  HCT 44.5 45.0 41.0  MCV 104.0*  --   --   MCH 35.7*  --   --   MCHC 34.4  --   --   RDW 11.8  --   --   PLT 153  --   --    Thyroid No results for input(s): "TSH", "FREET4" in the last 168 hours.  BNPNo results for input(s): "BNP", "PROBNP" in the last 168 hours.  DDimer No results for input(s): "DDIMER" in the last 168 hours.   Radiology/Studies:  ECHOCARDIOGRAM COMPLETE  Result Date: 12/15/2022    ECHOCARDIOGRAM REPORT   Patient Name:   Vayden KEYSHUN ELPERS Date of Exam: 12/15/2022 Medical Rec #:  [id number]          Height:       73.0 in Accession #:    [id number]         Weight:       190.0 lb Date of Birth:  [birth year]           BSA:          2.105 m Patient Age:    44 years           BP:           81/58 mmHg Patient Gender: M                  HR:            79 bpm. Exam Location:  Inpatient Procedure: 2D Echo, Cardiac Doppler and Color Doppler STAT ECHO Indications:    Congestive Heart Failure I50.9  History:        Patient has prior history of Echocardiogram examinations, most                 recent 01/20/2021. Previous Myocardial Infarction and CAD; Risk                 Factors:Dyslipidemia and GERD.  Sonographer:    Bernadene Person RDCS Referring Phys: [id number] [place]  1. Left ventricular ejection fraction, by estimation, is 55%. The left ventricle has low normal function. The left ventricle demonstrates regional wall motion abnormalities (see scoring diagram/findings for description). Left ventricular diastolic parameters were normal.  2. Right ventricular systolic function is mildly reduced. The right ventricular size is mildly enlarged. Tricuspid regurgitation signal is inadequate for assessing PA pressure.  3. Right atrial size was mildly dilated.  4. The mitral valve is grossly normal. Mild mitral valve regurgitation. No evidence of mitral stenosis.  5. The aortic valve is grossly normal. There is mild thickening of the aortic valve. Aortic valve regurgitation is not visualized. No aortic stenosis is present.  6. The inferior vena cava is normal in size with greater than 50% respiratory variability, suggesting right atrial pressure of 3 mmHg. FINDINGS  Left Ventricle: Left ventricular ejection fraction, by estimation, is 55%. The left ventricle has low normal function. The left  ventricle demonstrates regional wall motion abnormalities. The left ventricular internal cavity size was normal in size. There is no left ventricular hypertrophy. Left ventricular diastolic parameters were normal.  LV Wall Scoring: The posterior wall is hypokinetic. Right Ventricle: The right ventricular size is mildly enlarged. No increase in right ventricular wall thickness. Right ventricular systolic function is mildly reduced. Tricuspid regurgitation signal is  inadequate for assessing PA pressure. The tricuspid regurgitant velocity is 1.75 m/s, and with an assumed right atrial pressure of 3 mmHg, the estimated right ventricular systolic pressure is 21.1 mmHg. Left Atrium: Left atrial size was normal in size. Right Atrium: Right atrial size was mildly dilated. Pericardium: There is no evidence of pericardial effusion. Mitral Valve: The mitral valve is grossly normal. Mild mitral valve regurgitation. No evidence of mitral valve stenosis. Tricuspid Valve: The tricuspid valve is normal in structure. Tricuspid valve regurgitation is trivial. No evidence of tricuspid stenosis. Aortic Valve: The aortic valve is grossly normal. There is mild thickening of the aortic valve. Aortic valve regurgitation is not visualized. No aortic stenosis is present. Pulmonic Valve: The pulmonic valve was grossly normal. Pulmonic valve regurgitation is trivial. No evidence of pulmonic stenosis. Aorta: The aortic root is normal in size and structure. Ascending aorta measurements are within normal limits for age when indexed to body surface area. Venous: The inferior vena cava is normal in size with greater than 50% respiratory variability, suggesting right atrial pressure of 3 mmHg. IAS/Shunts: No atrial level shunt detected by color flow Doppler.  LEFT VENTRICLE PLAX 2D LVIDd:         5.30 cm      Diastology LVIDs:         3.70 cm      LV e' medial:    9.03 cm/s LV PW:         0.80 cm      LV E/e' medial:  5.3 LV IVS:        0.90 cm      LV e' lateral:   12.10 cm/s LVOT diam:     2.10 cm      LV E/e' lateral: 4.0 LV SV:         54 LV SV Index:   26 LVOT Area:     3.46 cm  LV Volumes (MOD) LV vol d, MOD A2C: 124.0 ml LV vol d, MOD A4C: 134.0 ml LV vol s, MOD A2C: 49.2 ml LV vol s, MOD A4C: 50.0 ml LV SV MOD A2C:     74.8 ml LV SV MOD A4C:     134.0 ml LV SV MOD BP:      78.8 ml RIGHT VENTRICLE RV S prime:     12.10 cm/s TAPSE (M-mode): 2.5 cm LEFT ATRIUM           Index        RIGHT ATRIUM            Index LA diam:      4.00 cm 1.90 cm/m   RA Area:     24.90 cm LA Vol (A2C): 38.1 ml 18.10 ml/m  RA Volume:   73.50 ml  34.91 ml/m LA Vol (A4C): 46.1 ml 21.90 ml/m  AORTIC VALVE LVOT Vmax:   79.00 cm/s LVOT Vmean:  52.000 cm/s LVOT VTI:    0.155 m  AORTA Ao Root diam: 3.40 cm Ao Asc diam:  3.70 cm MITRAL VALVE  TRICUSPID VALVE MV Area (PHT): 3.42 cm    TR Peak grad:   12.2 mmHg MV Decel Time: 222 msec    TR Vmax:        175.00 cm/s MR Peak grad: 28.5 mmHg MR Mean grad: 23.0 mmHg    SHUNTS MR Vmax:      267.00 cm/s  Systemic VTI:  0.16 m MR Vmean:     235.0 cm/s   Systemic Diam: 2.10 cm MV E velocity: 47.90 cm/s MV A velocity: 49.90 cm/s MV E/A ratio:  0.96 Cherlynn Kaiser MD Electronically signed by Cherlynn Kaiser MD Signature Date/Time: 12/15/2022/2:37:17 PM    Final    DG Chest Port 1 View  Result Date: 12/15/2022 CLINICAL DATA:  ETT placement. EXAM: PORTABLE CHEST 1 VIEW COMPARISON:  Chest radiograph 05/05/2022 FINDINGS: The endotracheal tube tip is approximately 6.0 cm from the carina at the level of the clavicular heads. The enteric catheter tip is in the stomach. The cardiomediastinal silhouette is normal. There is no focal consolidation or pulmonary edema. There is no pleural effusion or pneumothorax There is no acute osseous abnormality. IMPRESSION: 1. Endotracheal tube tip at the level of the clavicular heads approximately 6.0 cm from the carina. 2. No focal airspace disease or pleural effusion. Electronically Signed   By: Valetta Mole M.D.   On: 12/15/2022 13:23   DG Abd Portable 1V  Result Date: 12/15/2022 CLINICAL DATA:  Nasal/orogastric tube placement. EXAM: PORTABLE ABDOMEN - 1 VIEW COMPARISON:  CT, 06/27/2020.  Current chest radiograph. FINDINGS: Nasal/orogastric tube passes below the diaphragm, tip in the distal stomach. Normal bowel gas pattern. IMPRESSION: Well-positioned nasal/orogastric tube. Electronically Signed   By: Lajean Manes M.D.   On: 12/15/2022 13:22      Assessment and Plan:   Cardiac Arrest  History of CAD  - Patient has a known history of CAD-- had a STEMI in 2011 that was treated with DES x1 in [place] - Now, patient presenting after witnessed cardiac arrest. Patient went down in his front yard and lives next door to a fire station. Fire fighters saw patient go down and started CPR immediately. CPR for 8 minutes and 3 defibrillator shocks before ROSC - hsTn only elevated to 60  - EKG with minimal ST depression in leads V2, V3 (note, V4 missing from EKG). EKG and trop elevation not consistent with STEMI  - Head CT, CTA chest, and T cervical spine pending  - Patient has a known history of CAD and now has cardiac arrest. Once imaging complete, plan for cardiac catheterization today  - Dr. Johnsie Cancel discussed with patient's sister, who gave consent for cath  Otherwise per primary  - Post arrest encephalopathy  - GERD  - Hypothyroidism   Risk Assessment/Risk Scores:          For questions or updates, please contact [place] Please consult [email or web address] for contact info under    Signed, Margie Billet, PA-C  12/15/2022 3:09 PM

## 2022-12-16 ENCOUNTER — Encounter (HOSPITAL_COMMUNITY): Payer: Self-pay | Admitting: Internal Medicine

## 2022-12-16 ENCOUNTER — Inpatient Hospital Stay (HOSPITAL_COMMUNITY): Payer: Medicare Other

## 2022-12-16 ENCOUNTER — Other Ambulatory Visit: Payer: Self-pay

## 2022-12-16 DIAGNOSIS — R4182 Altered mental status, unspecified: Secondary | ICD-10-CM | POA: Diagnosis not present

## 2022-12-16 DIAGNOSIS — I469 Cardiac arrest, cause unspecified: Secondary | ICD-10-CM | POA: Diagnosis not present

## 2022-12-16 LAB — CBC
HCT: 41.1 % (ref 39.0–52.0)
Hemoglobin: 14.3 g/dL (ref 13.0–17.0)
MCH: 34.7 pg — ABNORMAL HIGH (ref 26.0–34.0)
MCHC: 34.8 g/dL (ref 30.0–36.0)
MCV: 99.8 fL (ref 80.0–100.0)
Platelets: 167 10*3/uL (ref 150–400)
RBC: 4.12 MIL/uL — ABNORMAL LOW (ref 4.22–5.81)
RDW: 12 % (ref 11.5–15.5)
WBC: 8.2 10*3/uL (ref 4.0–10.5)
nRBC: 0 % (ref 0.0–0.2)

## 2022-12-16 LAB — HEPATIC FUNCTION PANEL
ALT: 177 U/L — ABNORMAL HIGH (ref 0–44)
AST: 135 U/L — ABNORMAL HIGH (ref 15–41)
Albumin: 3.7 g/dL (ref 3.5–5.0)
Alkaline Phosphatase: 74 U/L (ref 38–126)
Bilirubin, Direct: 0.2 mg/dL (ref 0.0–0.2)
Indirect Bilirubin: 0.8 mg/dL (ref 0.3–0.9)
Total Bilirubin: 1 mg/dL (ref 0.3–1.2)
Total Protein: 6.6 g/dL (ref 6.5–8.1)

## 2022-12-16 LAB — RAPID URINE DRUG SCREEN, HOSP PERFORMED
Amphetamines: NOT DETECTED
Barbiturates: NOT DETECTED
Benzodiazepines: POSITIVE — AB
Cocaine: NOT DETECTED
Opiates: NOT DETECTED
Tetrahydrocannabinol: NOT DETECTED

## 2022-12-16 LAB — BASIC METABOLIC PANEL
Anion gap: 24 — ABNORMAL HIGH (ref 5–15)
Anion gap: 17 — ABNORMAL HIGH (ref 5–15)
BUN: 22 mg/dL (ref 8–23)
BUN: 22 mg/dL (ref 8–23)
CO2: 11 mmol/L — ABNORMAL LOW (ref 22–32)
CO2: 15 mmol/L — ABNORMAL LOW (ref 22–32)
Calcium: 8.7 mg/dL — ABNORMAL LOW (ref 8.9–10.3)
Calcium: 8.8 mg/dL — ABNORMAL LOW (ref 8.9–10.3)
Chloride: 102 mmol/L (ref 98–111)
Chloride: 103 mmol/L (ref 98–111)
Creatinine, Ser: 1.56 mg/dL — ABNORMAL HIGH (ref 0.61–1.24)
Creatinine, Ser: 1.34 mg/dL — ABNORMAL HIGH (ref 0.61–1.24)
GFR, Estimated: 47 mL/min — ABNORMAL LOW (ref >60)
GFR, Estimated: 56 mL/min — ABNORMAL LOW (ref >60)
Glucose, Bld: 265 mg/dL — ABNORMAL HIGH (ref 70–99)
Glucose, Bld: 235 mg/dL — ABNORMAL HIGH (ref 70–99)
Potassium: 3.2 mmol/L — ABNORMAL LOW (ref 3.5–5.1)
Potassium: 3.8 mmol/L (ref 3.5–5.1)
Sodium: 137 mmol/L (ref 135–145)
Sodium: 135 mmol/L (ref 135–145)

## 2022-12-16 LAB — URINALYSIS, MICROSCOPIC (REFLEX): Squamous Epithelial / HPF: NONE SEEN /HPF (ref 0–5)

## 2022-12-16 LAB — POCT I-STAT 7, (LYTES, BLD GAS, ICA,H+H)
Acid-base deficit: 3 mmol/L — ABNORMAL HIGH (ref 0.0–2.0)
Bicarbonate: 20.7 mmol/L (ref 20.0–28.0)
Calcium, Ion: 1.26 mmol/L (ref 1.15–1.40)
HCT: 38 % — ABNORMAL LOW (ref 39.0–52.0)
Hemoglobin: 12.9 g/dL — ABNORMAL LOW (ref 13.0–17.0)
O2 Saturation: 99 %
Patient temperature: 99
Potassium: 4.6 mmol/L (ref 3.5–5.1)
Sodium: 138 mmol/L (ref 135–145)
TCO2: 22 mmol/L (ref 22–32)
pCO2 arterial: 31.7 mmHg — ABNORMAL LOW (ref 32–48)
pH, Arterial: 7.424 (ref 7.35–7.45)
pO2, Arterial: 137 mmHg — ABNORMAL HIGH (ref 83–108)

## 2022-12-16 LAB — URINALYSIS, ROUTINE W REFLEX MICROSCOPIC
Glucose, UA: NEGATIVE mg/dL
Ketones, ur: NEGATIVE mg/dL
Leukocytes,Ua: NEGATIVE
Nitrite: NEGATIVE
Protein, ur: 30 mg/dL — AB
Specific Gravity, Urine: 1.02 (ref 1.005–1.030)
pH: 6 (ref 5.0–8.0)

## 2022-12-16 LAB — ABO/RH: ABO/RH(D): O POS

## 2022-12-16 LAB — PHOSPHORUS: Phosphorus: 2.3 mg/dL — ABNORMAL LOW (ref 2.5–4.6)

## 2022-12-16 LAB — GLUCOSE, CAPILLARY
Glucose-Capillary: 235 mg/dL — ABNORMAL HIGH (ref 70–99)
Glucose-Capillary: 216 mg/dL — ABNORMAL HIGH (ref 70–99)
Glucose-Capillary: 79 mg/dL (ref 70–99)
Glucose-Capillary: 86 mg/dL (ref 70–99)
Glucose-Capillary: 104 mg/dL — ABNORMAL HIGH (ref 70–99)
Glucose-Capillary: 93 mg/dL (ref 70–99)

## 2022-12-16 LAB — MRSA NEXT GEN BY PCR, NASAL: MRSA by PCR Next Gen: NOT DETECTED

## 2022-12-16 LAB — TROPONIN I (HIGH SENSITIVITY)
Troponin I (High Sensitivity): 594 ng/L (ref <18)
Troponin I (High Sensitivity): 518 ng/L (ref <18)

## 2022-12-16 LAB — MAGNESIUM
Magnesium: 2 mg/dL (ref 1.7–2.4)
Magnesium: 2 mg/dL (ref 1.7–2.4)

## 2022-12-16 LAB — TRIGLYCERIDES: Triglycerides: 72 mg/dL (ref <150)

## 2022-12-16 LAB — LACTIC ACID, PLASMA
Lactic Acid, Venous: 7.1 mmol/L (ref 0.5–1.9)
Lactic Acid, Venous: 1.8 mmol/L (ref 0.5–1.9)
Lactic Acid, Venous: 0.9 mmol/L (ref 0.5–1.9)

## 2022-12-16 MED ORDER — HALOPERIDOL LACTATE 5 MG/ML IJ SOLN
1.0000 mg | INTRAMUSCULAR | Status: DC | PRN
  Administered 2022-12-16 – 2022-12-17 (×3): 1 mg via INTRAVENOUS
  Filled 2022-12-16 (×3): qty 1

## 2022-12-16 MED ORDER — ORAL CARE MOUTH RINSE: 15.0000 mL | OROMUCOSAL | Status: DC | PRN

## 2022-12-16 MED ORDER — LEVOTHYROXINE SODIUM 75 MCG PO TABS
125.0000 ug | ORAL_TABLET | Freq: Every day | ORAL | Status: DC
  Administered 2022-12-17: 125 ug
  Filled 2022-12-16: qty 1

## 2022-12-16 MED ORDER — INSULIN DETEMIR 100 UNIT/ML ~~LOC~~ SOLN
10.0000 [IU] | Freq: Two times a day (BID) | SUBCUTANEOUS | Status: DC
  Filled 2022-12-16 (×2): qty 0.1

## 2022-12-16 MED ORDER — POTASSIUM CHLORIDE 10 MEQ/50ML IV SOLN: 10.0000 meq | INTRAVENOUS | Status: DC

## 2022-12-16 MED ORDER — CHLORHEXIDINE GLUCONATE CLOTH 2 % EX PADS
6.0000 | MEDICATED_PAD | Freq: Every day | CUTANEOUS | Status: DC
  Administered 2022-12-15 – 2022-12-21 (×7): 6 via TOPICAL

## 2022-12-16 MED ORDER — DEXMEDETOMIDINE HCL IN NACL 400 MCG/100ML IV SOLN: 0.0000 ug/kg/h | INTRAVENOUS | Status: DC

## 2022-12-16 MED ORDER — POTASSIUM CHLORIDE 10 MEQ/50ML IV SOLN
10.0000 meq | INTRAVENOUS | Status: AC
  Administered 2022-12-16 (×4): 10 meq via INTRAVENOUS
  Filled 2022-12-16 (×4): qty 50

## 2022-12-16 MED ORDER — INSULIN ASPART 100 UNIT/ML IJ SOLN
0.0000 [IU] | INTRAMUSCULAR | Status: DC
  Administered 2022-12-16 (×3): 7 [IU] via SUBCUTANEOUS
  Administered 2022-12-18: 3 [IU] via SUBCUTANEOUS

## 2022-12-16 MED ORDER — DEXMEDETOMIDINE HCL IN NACL 400 MCG/100ML IV SOLN
INTRAVENOUS | Status: AC
  Administered 2022-12-16: 0.2 ug/kg/h via INTRAVENOUS
  Filled 2022-12-16: qty 100

## 2022-12-16 MED ORDER — POTASSIUM CHLORIDE 20 MEQ PO PACK
20.0000 meq | PACK | ORAL | Status: AC
  Administered 2022-12-16 (×2): 20 meq
  Filled 2022-12-16 (×2): qty 1

## 2022-12-16 MED ORDER — ORAL CARE MOUTH RINSE
15.0000 mL | OROMUCOSAL | Status: DC
  Administered 2022-12-16 (×9): 15 mL via OROMUCOSAL

## 2022-12-16 NOTE — Progress Notes (Signed)
eLink Physician-Brief Progress Note Patient Name: Matthew Pratt DOB: Sep 09, 1951 MRN: [id number]   Date of Service  12/16/2022  HPI/Events of Note  Hyperglycemia - Blood glucose = 216. BMI = 26.24 and patient on Solumedrol.  eICU Interventions  Plan: Q 4 hour resistant Novolog SSI.     Intervention Category Major Interventions: Hyperglycemia - active titration of insulin therapy  Dreshon Proffit [place] 12/16/2022, 12:08 AM

## 2022-12-16 NOTE — Progress Notes (Signed)
eLink Physician-Brief Progress Note Patient Name: Matthew Pratt DOB: February 18, 1951 MRN: [id number]   Date of Service  12/16/2022  HPI/Events of Note  Agitation - Patient trying to get OOB. Nursing request for Posey belt and sedative.   eICU Interventions  Plan: Haldol 1 mg IV Q 3 hours PRN agitation.  Monitor QTc interval Q 4 hours. Notify MD if QTc interval > 0.5 seconds.      Intervention Category Major Interventions: Delirium, psychosis, severe agitation - evaluation and management  Lysle Dingwall 12/16/2022, 8:36 PM

## 2022-12-16 NOTE — Progress Notes (Signed)
PCCM interval progress note  Seen in follow up this afternoon on precedex. Is calm, awake alert and following commands. Weaning well on PSV. Says he wears a CPAP at home  Meets extubation criteria. Discussed w MD   Plan -extubate  -qHS and PRN BiPAP   CCT: 15 minutes    Eliseo Gum MSN, AGACNP-BC [place] for pager 12/16/2022, 6:11 PM

## 2022-12-16 NOTE — Progress Notes (Signed)
NAME:  Matthew Pratt, MRN:  [id number], DOB:  08-May-1951, LOS: 1 ADMISSION DATE:  12/15/2022, CONSULTATION DATE:  12/15/21 REFERRING MD:  EDP, CHIEF COMPLAINT:  arrest   History of Present Illness:  72 year old man w/ hx of CAD, prior STEMI, HLD, GERD, OSA, hypothyroidism, GB issues presenting with witnessed OOH cardiac arrest.  +fall witnessed by neighbor.  Unclear initial rhtyhm but did get 3 AED shocks.  Total arrest time x 8-9 minutes.  Breathing on ROSC but not following commands.  Labs/imaging pending.  EKG no STEMI.  CCM to admit.  Pertinent  Medical History   Past Medical History:  Diagnosis Date   Anxiety    Arthritis    BPH (benign prostatic hypertrophy)    CAD (coronary artery disease)    s/p STEMI 10/15/10 w/ Promus DES placed x1 in the first OM   Colon polyps    Complication of anesthesia    pt. reported 2015 penile inplant when getting some anesthesia had chest pain,they readjusted  the meds. and continued with surgery. Had no further problems di not have to follow up with anyone.   Depression    Depression with anxiety    Dupuytren's contracture of hand    ED (erectile dysfunction)    GERD (gastroesophageal reflux disease)    Glaucoma    HLD (hyperlipidemia)    Hypocontractile bladder    Hypothyroidism    MI (myocardial infarction) ([place])    denies   OSA on CPAP    Peyronie's disease    Rosacea    Skin cancer    BCC    Significant Hospital Events: Including procedures, antibiotic start and stop dates in addition to other pertinent events   1/10 admit, king exchanged for ETT 1/11 Starting to wake up agitated, apnea on ventilatory=  Interim History / Subjective:  Waking up agitated.  Moments of apnea on mechanical ventilation  Objective   Blood pressure 113/73, pulse (!) 55, temperature 98.8 F (37.1 C), resp. rate 16, height '6\' 1"'$  (1.854 m), weight 90.2 kg, SpO2 98 %.    Vent Mode: PRVC FiO2 (%):  [40 %-50 %] 40 % Set Rate:  [24 bmp] 24 bmp Vt Set:   [630 mL] 630 mL PEEP:  [5 cmH20] 5 cmH20 Pressure Support:  [8 cmH20] 8 cmH20 Plateau Pressure:  [13 cmH20-16 cmH20] 13 cmH20   Intake/Output Summary (Last 24 hours) at 12/16/2022 1443 Last data filed at 12/16/2022 1351 Gross per 24 hour  Intake 1669.78 ml  Output 2535 ml  Net -865.22 ml   Filed Weights   12/15/22 2000 12/15/22 2200 12/16/22 0355  Weight: 90.2 kg 90.2 kg 90.2 kg    Examination: General: elderly man in NAD HENT: ETT in place, minimal secretions Lungs: + bilateral breath sounds, occasionally triggering vent Cardiovascular: ext warm, heart sounds regular Abdomen: soft, hypoactive BS Extremities: no edema or cyanosis Neuro: some shivering, +weak gag; moves spontaneously and to painful stimulation. GU: foley to be placed   Ancillary tests personally reviewed  No PE on CTA Creatinine improving 1.34 Initial troponin negative rose to 910 Lactate has now cleared 1.8 the posterior wall is hypokinetic.  Normal RV function with normal estimated PA pressures.  No significant valvular abnormalities. Echocardiogram shows EF 55% with regional wall motion abnormalities. Assessment & Plan:  OHCA-initial shockable rhythm.  Etiology unclear. Possible NSTEMI Post arrest encephalopathy- meets criteria for TTM CAD, prior STEMI, HLD, GERD, OSA, hypothyroidism, GB issues  -Switch sedation to Precedex -SBT as  tolerated then attempt sedation interruption. -Extubate as mental status allows. -Consider noninvasive ischemic workup (coronary CT) -Consider EP evaluation given initial shockable rhythm.. -Resume home medication once extubated.  Best Practice (right click and "Reselect all SmartList Selections" daily)   Diet/type: NPO DVT prophylaxis:heparin GI prophylaxis: H2B Lines: N/A Foley:  Yes, and it is still needed Code Status:  full code Last date of multidisciplinary goals of care discussion [Sister en route]  CRITICAL CARE Performed by: Kipp Brood   Total  critical care time: 35 minutes  Critical care time was exclusive of separately billable procedures and treating other patients.  Critical care was necessary to treat or prevent imminent or life-threatening deterioration.  Critical care was time spent personally by me on the following activities: development of treatment plan with patient and/or surrogate as well as nursing, discussions with consultants, evaluation of patient's response to treatment, examination of patient, obtaining history from patient or surrogate, ordering and performing treatments and interventions, ordering and review of laboratory studies, ordering and review of radiographic studies, pulse oximetry, re-evaluation of patient's condition and participation in multidisciplinary rounds.  Kipp Brood, MD [facility] ICU Physician [place]  Pager: [phone] Mobile: [phone] After hours: [phone].

## 2022-12-16 NOTE — Progress Notes (Signed)
[facility] ADULT ICU REPLACEMENT PROTOCOL   The patient does apply for the [facility] Adult ICU Electrolyte Replacment Protocol based on the criteria listed below:   1.Exclusion criteria: TCTS, ECMO, Dialysis, and Myasthenia Gravis patients 2. Is GFR >/= 30 ml/min? Yes.    Patient's GFR today is 47 3. Is SCr </= 2? Yes.   Patient's SCr is 1.56 mg/dL 4. Did SCr increase >/= 0.5 in 24 hours? No. 5.Pt's weight >40kg  Yes.   6. Abnormal electrolyte(s): K+ 3.2  7. Electrolytes replaced per protocol 8.  Call MD STAT for K+ </= 2.5, Phos </= 1, or Mag </= 1 Physician:  Ed Blalock [facility] 12/16/2022 2:34 AM

## 2022-12-16 NOTE — Procedures (Signed)
Extubation Procedure Note  Patient Details:   Name: Matthew Pratt DOB: [birth year] MRN: [id number]   Airway Documentation:    Vent end date: 12/16/22 Vent end time: 1838   Evaluation  O2 sats: stable throughout Complications: No apparent complications Patient did tolerate procedure well. Bilateral Breath Sounds: Clear, Diminished   Yes   Pt extubated to 2L [place], pt tolerated well. No stridor noted, cuff leak present, RN at bedside, RT will monitor.   Carren Rang 12/16/2022, 6:39 PM

## 2022-12-16 NOTE — Procedures (Signed)
Patient Name: Matthew Pratt  MRN: [id number]  Epilepsy Attending: Lora Havens  Referring Physician/Provider: Candee Furbish, MD  Date: 12/16/2022 Duration: 22.20 mins  Patient history: 72 year old male status post cardiac arrest.  EEG to evaluate for seizure.  Level of alertness: comatose  AEDs during EEG study: Versed, propofol  Technical aspects: This EEG study was done with scalp electrodes positioned according to the 10-20 International system of electrode placement. Electrical activity was reviewed with band pass filter of 1-'70Hz'$ , sensitivity of 7 uV/mm, display speed of 32m/sec with a '60Hz'$  notched filter applied as appropriate. EEG data were recorded continuously and digitally stored.  Video monitoring was available and reviewed as appropriate.  Description: EEG showed continuous generalized predominantly 5 to 8 Hz theta slowing admixed with intermittent generalized 2 to 3 Hz delta slowing. Hyperventilation and photic stimulation were not performed.     ABNORMALITY - Continuous slow, generalized  IMPRESSION: This study is suggestive of severe diffuse encephalopathy, nonspecific etiology. No seizures or epileptiform discharges were seen throughout the recording.  Dura Mccormack OBarbra Sarks

## 2022-12-16 NOTE — Progress Notes (Signed)
Cardiologist:  Matthew Pratt   Subjective:  Intubated sedation turned off  Not much purposeful movement per nurse   Objective:  Vitals:   12/16/22 0630 12/16/22 0645 12/16/22 0651 12/16/22 0700  BP:      Pulse: (!) 55 60  (!) 59  Resp: (!) 24 (!) 24  (!) 24  Temp: 97.9 F (36.6 C) 98.1 F (36.7 C) 98.1 F (36.7 C) 98.1 F (36.7 C)  TempSrc:      SpO2: 99% 98%  99%  Weight:      Height:        Intake/Output from previous day:  Intake/Output Summary (Last 24 hours) at 12/16/2022 0905 Last data filed at 12/16/2022 0600 Gross per 24 hour  Intake 1375.21 ml  Output 2115 ml  Net -739.79 ml    Physical Exam: Intubated NG tube Central line left subclavian Abdomen benign Trace edema Not following commands or purposeful movement   Lab Results: Basic Metabolic Panel: Recent Labs    12/15/22 1310 12/15/22 1317 12/15/22 2350 12/16/22 0359  NA 134*   < > 137 135  K 4.3   < > 3.2* 3.8  CL 100   < > 102 103  CO2 19*  --  11* 15*  GLUCOSE 107*   < > 265* 235*  BUN 18   < > 22 22  CREATININE 1.55*   < > 1.56* 1.34*  CALCIUM 8.8*  --  8.7* 8.8*  MG 2.0  --  2.0 2.0  PHOS 5.4*  --   --  2.3*   < > = values in this interval not displayed.   Liver Function Tests: Recent Labs    12/15/22 1310 12/16/22 0359  AST 83* 135*  ALT 82* 177*  ALKPHOS 80 74  BILITOT 1.3* 1.0  PROT 6.7 6.6  ALBUMIN 3.8 3.7   No results for input(s): "LIPASE", "AMYLASE" in the last 72 hours. CBC: Recent Labs    12/15/22 2115 12/16/22 0359  WBC 8.3 8.2  HGB 14.8 14.3  HCT 41.3 41.1  MCV 99.8 99.8  PLT 180 167    Fasting Lipid Panel: Recent Labs    12/16/22 0359  TRIG 72     Imaging: CARDIAC CATHETERIZATION  Result Date: 12/15/2022   Prox LAD to Mid LAD lesion is 55% stenosed.  Dist LAD lesion is 45% stenosed.   Prox Cx to Mid Cx lesion is 55% stenosed.   1st Mrg (previously placed Drug Eluting stent is 100% stenosed -> CTO with left to left and right left lateral   Prox RCA  lesion is 60% stenosed. Mid RCA lesion is 55% stenosed.   LV end diastolic pressure is low.   There is no aortic valve stenosis. POST-CATH DIAGNOSES Diffuse moderate to severe disease with calcification in all the proximal vessels, and likely CTO of previous OM1 stent filling via collaterals from both left to right and right to left. No obvious culprit lesion to explain cardiac arrest. Low LVEDP of 6 mmHg. A-line pressures were in the high 80s to low 90s compared to cuff . Hypokalemia noted on labs-potassium 2.9.  Was started on IV potassium 10 mg once RECOMMENDATION Monitor clinically for signs of recovery per PCCM. I suspect regional wall motion abnormality on echo was related to the CTO of the OM. Matthew Hew, MD  CT Angio Chest/Abd/Pel for Dissection W and/or Wo Contrast  Result Date: 12/15/2022 CLINICAL DATA:  Status post cardiac arrest, acute aortic syndrome suspected EXAM: CT ANGIOGRAPHY CHEST, ABDOMEN  AND PELVIS TECHNIQUE: Non-contrast CT of the chest was initially obtained. Multidetector CT imaging through the chest, abdomen and pelvis was performed using the standard protocol during bolus administration of intravenous contrast. Multiplanar reconstructed images and MIPs were obtained and reviewed to evaluate the vascular anatomy. RADIATION DOSE REDUCTION: This exam was performed according to the departmental dose-optimization program which includes automated exposure control, adjustment of the mA and/or kV according to patient size and/or use of iterative reconstruction technique. CONTRAST:  30m OMNIPAQUE IOHEXOL 350 MG/ML SOLN COMPARISON:  None Available. FINDINGS: CTA CHEST FINDINGS Cardiovascular: There is no demonstrable mural hematoma in the noncontrast images of thoracic aorta. There is homogeneous enhancement in thoracic aorta. There is no demonstrable intimal flap. Atherosclerotic plaques and calcifications are seen in aortic arch. There is no extravasation of contrast. Major branches of  thoracic aorta appear patent. There are no filling defects in central pulmonary artery branches. Coronary artery calcifications are seen. Mediastinum/Nodes: There is no mediastinal hematoma. There is no significant lymphadenopathy. Lungs/Pleura: Small linear infiltrates are seen in the posterior aspects of both lungs. There is no focal consolidation. There are no signs of alveolar pulmonary edema. There is no pleural effusion or pneumothorax. Small pleural plaques are seen in posterior aspects of both apices. Musculoskeletal: There is 20-30% decrease in height of body of T12 vertebra. Schmorl's nodes are seen in endplates of TH84vertebral body, more prominent in the upper endplate. Alignment of posterior margins of vertebral bodies appears normal. Review of the MIP images confirms the above findings. CTA ABDOMEN AND PELVIS FINDINGS VASCULAR Aorta: There is no evidence of dissection or focal aneurysmal dilation. There are scattered calcifications and atherosclerotic plaques in aorta and its major branches. Celiac: There are scattered calcifications without significant stenosis SMA: There are small plaques and scattered calcifications without significant stenosis. Renals: There are calcifications in the proximal course without significant stenosis. IMA: Patent. Iliacs: There are scattered calcifications and atherosclerotic plaques, more prominent at the bifurcation of right common iliac without significant stenosis. Veins: Unremarkable. Review of the MIP images confirms the above findings. NON-VASCULAR Hepatobiliary: No focal abnormalities are seen in liver. Surgical clips are seen in gallbladder fossa. There is prominence of extrahepatic bile ducts with distal common bile duct in the head of the pancreas measuring 1.3 cm. There is no significant dilation of intrahepatic bile ducts. Pancreas: No focal abnormalities are seen. Spleen: Unremarkable. Adrenals/Urinary Tract: Adrenals are unremarkable. There is contrast in  the pelvocaliceal systems limiting evaluation for nonobstructing stones. There is no hydronephrosis. Possible subcentimeter cyst is seen in the lower pole of right kidney. Ureters are not dilated. There is contrast in the lumen of bladder. Foley catheter is present. Pockets of air in the bladder may be due to recent catheterization. There is diffuse wall thickening in the bladder. Bladder is not distended. Stomach/Bowel: Stomach is moderately distended. Tip of enteric tube is seen in the distal antrum of the stomach. There is fluid in the lumen of slightly dilated distal small bowel loops. Appendix is not dilated. Liquid stool is seen in colon. There is no significant wall thickening in colon. There is no pericolic stranding. Lymphatic: No significant lymphadenopathy is seen. Reproductive: Unremarkable. Other: Note is no ascites or pneumoperitoneum. There is 4.6 cm fluid density structure immediately behind the anterior abdominal wall close to the right inguinal region. This most likely is a device used for management of erectile dysfunction. Musculoskeletal: Decrease in height of body of T12 vertebra along with Schmorl's nodes may be related to  previous injury. As far as seen, no recent fractures are seen in bony structures. Review of the MIP images confirms the above findings. IMPRESSION: There is no evidence of dissection in thoracic and abdominal aorta. Major branches of thoracic and abdominal aorta are patent. Coronary artery disease. Aortic atherosclerosis. There is no evidence of central pulmonary artery embolism. Small linear patchy infiltrates are seen in the posterior aspects of both lungs suggesting subsegmental atelectasis. There is no evidence of intestinal obstruction or pneumoperitoneum. Appendix is not dilated. There is no hydronephrosis. There is fluid in the lumen of slightly dilated distal small bowel loops and in the colon suggesting possible nonspecific enteritis. There is wall thickening in the  urinary bladder which may be due to incomplete distention or suggest cystitis. Prominence of extrahepatic bile ducts may be related to previous cholecystectomy. Other findings as described in the body of the report. Electronically Signed   By: Elmer Picker M.D.   On: 12/15/2022 16:56   CT HEAD WO CONTRAST  Result Date: 12/15/2022 CLINICAL DATA:  Provided history: Mental status change, unknown cause. Neck trauma. Additional history provided: Witnessed arrest, status post resuscitation. EXAM: CT HEAD WITHOUT CONTRAST CT CERVICAL SPINE WITHOUT CONTRAST TECHNIQUE: Multidetector CT imaging of the head and cervical spine was performed following the standard protocol without intravenous contrast. Multiplanar CT image reconstructions of the cervical spine were also generated. RADIATION DOSE REDUCTION: This exam was performed according to the departmental dose-optimization program which includes automated exposure control, adjustment of the mA and/or kV according to patient size and/or use of iterative reconstruction technique. COMPARISON:  Head CT 07/13/2021. Maxillofacial CT 07/13/2021. FINDINGS: CT HEAD FINDINGS Brain: There is no acute intracranial hemorrhage. No demarcated cortical infarct. No extra-axial fluid collection. No evidence of an intracranial mass. No midline shift. Vascular: No hyperdense vessel. Atherosclerotic calcifications. Skull: No fracture or aggressive osseous lesion. Sinuses/Orbits: No orbital mass or acute orbital finding. Small mucous retention cyst within the right maxillary sinus. CT CERVICAL SPINE FINDINGS Alignment: Slight grade 1 anterolisthesis at C2-C3. Slight grade 1 retrolisthesis at C3-C4. Slight grade 1 anterolisthesis at C7-T1. Skull base and vertebrae: The basion-dental and atlanto-dental intervals are maintained.No evidence of acute fracture to the cervical spine. Soft tissues and spinal canal: No prevertebral fluid or swelling. No visible canal hematoma. Disc levels:  Cervical spondylosis with multilevel disc space narrowing, disc bulges, endplate spurring and uncovertebral hypertrophy. No appreciable high-grade spinal canal stenosis. Multilevel bony neural foraminal narrowing. Upper chest: No consolidation within the imaged lung apices. No visible pneumothorax. Other: The ET tube terminates at the level of the clavicular heads. Partially imaged enteric tube. Partially imaged central venous catheter from a left-sided approach. IMPRESSION: CT head: 1. No evidence of acute intracranial abnormality. 2. Please note, a brain MRI would have greater sensitivity for acute hypoxic/ischemic injury. CT cervical spine: 1. No evidence of acute fracture to the cervical spine. 2. Mild grade 1 spondylolisthesis at C2-C3, C3-C4 and C7-T1. 3. Cervical spondylosis, as described. Electronically Signed   By: Kellie Simmering D.O.   On: 12/15/2022 16:44   CT Cervical Spine Wo Contrast  Result Date: 12/15/2022 CLINICAL DATA:  Provided history: Mental status change, unknown cause. Neck trauma. Additional history provided: Witnessed arrest, status post resuscitation. EXAM: CT HEAD WITHOUT CONTRAST CT CERVICAL SPINE WITHOUT CONTRAST TECHNIQUE: Multidetector CT imaging of the head and cervical spine was performed following the standard protocol without intravenous contrast. Multiplanar CT image reconstructions of the cervical spine were also generated. RADIATION DOSE REDUCTION: This exam was  performed according to the departmental dose-optimization program which includes automated exposure control, adjustment of the mA and/or kV according to patient size and/or use of iterative reconstruction technique. COMPARISON:  Head CT 07/13/2021. Maxillofacial CT 07/13/2021. FINDINGS: CT HEAD FINDINGS Brain: There is no acute intracranial hemorrhage. No demarcated cortical infarct. No extra-axial fluid collection. No evidence of an intracranial mass. No midline shift. Vascular: No hyperdense vessel. Atherosclerotic  calcifications. Skull: No fracture or aggressive osseous lesion. Sinuses/Orbits: No orbital mass or acute orbital finding. Small mucous retention cyst within the right maxillary sinus. CT CERVICAL SPINE FINDINGS Alignment: Slight grade 1 anterolisthesis at C2-C3. Slight grade 1 retrolisthesis at C3-C4. Slight grade 1 anterolisthesis at C7-T1. Skull base and vertebrae: The basion-dental and atlanto-dental intervals are maintained.No evidence of acute fracture to the cervical spine. Soft tissues and spinal canal: No prevertebral fluid or swelling. No visible canal hematoma. Disc levels: Cervical spondylosis with multilevel disc space narrowing, disc bulges, endplate spurring and uncovertebral hypertrophy. No appreciable high-grade spinal canal stenosis. Multilevel bony neural foraminal narrowing. Upper chest: No consolidation within the imaged lung apices. No visible pneumothorax. Other: The ET tube terminates at the level of the clavicular heads. Partially imaged enteric tube. Partially imaged central venous catheter from a left-sided approach. IMPRESSION: CT head: 1. No evidence of acute intracranial abnormality. 2. Please note, a brain MRI would have greater sensitivity for acute hypoxic/ischemic injury. CT cervical spine: 1. No evidence of acute fracture to the cervical spine. 2. Mild grade 1 spondylolisthesis at C2-C3, C3-C4 and C7-T1. 3. Cervical spondylosis, as described. Electronically Signed   By: Kellie Simmering D.O.   On: 12/15/2022 16:44   ECHOCARDIOGRAM COMPLETE  Result Date: 12/15/2022    ECHOCARDIOGRAM REPORT   Patient Name:   Matthew Pratt Date of Exam: 12/15/2022 Medical Rec #:  [id number]          Height:       73.0 in Accession #:    [id number]         Weight:       190.0 lb Date of Birth:  09-03-1951           BSA:          2.105 m Patient Age:    72 years           BP:           81/58 mmHg Patient Gender: M                  HR:           79 bpm. Exam Location:  Inpatient Procedure: 2D Echo,  Cardiac Doppler and Color Doppler STAT ECHO Indications:    Congestive Heart Failure I50.9  History:        Patient has prior history of Echocardiogram examinations, most                 recent 01/20/2021. Previous Myocardial Infarction and CAD; Risk                 Factors:Dyslipidemia and GERD.  Sonographer:    Bernadene Person RDCS Referring Phys: [id number] [place]  1. Left ventricular ejection fraction, by estimation, is 55%. The left ventricle has low normal function. The left ventricle demonstrates regional wall motion abnormalities (see scoring diagram/findings for description). Left ventricular diastolic parameters were normal.  2. Right ventricular systolic function is mildly reduced. The right ventricular size is mildly enlarged. Tricuspid regurgitation signal is inadequate for assessing PA  pressure.  3. Right atrial size was mildly dilated.  4. The mitral valve is grossly normal. Mild mitral valve regurgitation. No evidence of mitral stenosis.  5. The aortic valve is grossly normal. There is mild thickening of the aortic valve. Aortic valve regurgitation is not visualized. No aortic stenosis is present.  6. The inferior vena cava is normal in size with greater than 50% respiratory variability, suggesting right atrial pressure of 3 mmHg. FINDINGS  Left Ventricle: Left ventricular ejection fraction, by estimation, is 55%. The left ventricle has low normal function. The left ventricle demonstrates regional wall motion abnormalities. The left ventricular internal cavity size was normal in size. There is no left ventricular hypertrophy. Left ventricular diastolic parameters were normal.  LV Wall Scoring: The posterior wall is hypokinetic. Right Ventricle: The right ventricular size is mildly enlarged. No increase in right ventricular wall thickness. Right ventricular systolic function is mildly reduced. Tricuspid regurgitation signal is inadequate for assessing PA pressure. The tricuspid regurgitant  velocity is 1.75 m/s, and with an assumed right atrial pressure of 3 mmHg, the estimated right ventricular systolic pressure is 78.5 mmHg. Left Atrium: Left atrial size was normal in size. Right Atrium: Right atrial size was mildly dilated. Pericardium: There is no evidence of pericardial effusion. Mitral Valve: The mitral valve is grossly normal. Mild mitral valve regurgitation. No evidence of mitral valve stenosis. Tricuspid Valve: The tricuspid valve is normal in structure. Tricuspid valve regurgitation is trivial. No evidence of tricuspid stenosis. Aortic Valve: The aortic valve is grossly normal. There is mild thickening of the aortic valve. Aortic valve regurgitation is not visualized. No aortic stenosis is present. Pulmonic Valve: The pulmonic valve was grossly normal. Pulmonic valve regurgitation is trivial. No evidence of pulmonic stenosis. Aorta: The aortic root is normal in size and structure. Ascending aorta measurements are within normal limits for age when indexed to body surface area. Venous: The inferior vena cava is normal in size with greater than 50% respiratory variability, suggesting right atrial pressure of 3 mmHg. IAS/Shunts: No atrial level shunt detected by color flow Doppler.  LEFT VENTRICLE PLAX 2D LVIDd:         5.30 cm      Diastology LVIDs:         3.70 cm      LV e' medial:    9.03 cm/s LV PW:         0.80 cm      LV E/e' medial:  5.3 LV IVS:        0.90 cm      LV e' lateral:   12.10 cm/s LVOT diam:     2.10 cm      LV E/e' lateral: 4.0 LV SV:         54 LV SV Index:   26 LVOT Area:     3.46 cm  LV Volumes (MOD) LV vol d, MOD A2C: 124.0 ml LV vol d, MOD A4C: 134.0 ml LV vol s, MOD A2C: 49.2 ml LV vol s, MOD A4C: 50.0 ml LV SV MOD A2C:     74.8 ml LV SV MOD A4C:     134.0 ml LV SV MOD BP:      78.8 ml RIGHT VENTRICLE RV S prime:     12.10 cm/s TAPSE (M-mode): 2.5 cm LEFT ATRIUM           Index        RIGHT ATRIUM  Index LA diam:      4.00 cm 1.90 cm/m   RA Area:     24.90  cm LA Vol (A2C): 38.1 ml 18.10 ml/m  RA Volume:   73.50 ml  34.91 ml/m LA Vol (A4C): 46.1 ml 21.90 ml/m  AORTIC VALVE LVOT Vmax:   79.00 cm/s LVOT Vmean:  52.000 cm/s LVOT VTI:    0.155 m  AORTA Ao Root diam: 3.40 cm Ao Asc diam:  3.70 cm MITRAL VALVE               TRICUSPID VALVE MV Area (PHT): 3.42 cm    TR Peak grad:   12.2 mmHg MV Decel Time: 222 msec    TR Vmax:        175.00 cm/s MR Peak grad: 28.5 mmHg MR Mean grad: 23.0 mmHg    SHUNTS MR Vmax:      267.00 cm/s  Systemic VTI:  0.16 m MR Vmean:     235.0 cm/s   Systemic Diam: 2.10 cm MV E velocity: 47.90 cm/s MV A velocity: 49.90 cm/s MV E/A ratio:  0.96 Cherlynn Kaiser MD Electronically signed by Cherlynn Kaiser MD Signature Date/Time: 12/15/2022/2:37:17 PM    Final    DG Chest Port 1 View  Result Date: 12/15/2022 CLINICAL DATA:  ETT placement. EXAM: PORTABLE CHEST 1 VIEW COMPARISON:  Chest radiograph 05/05/2022 FINDINGS: The endotracheal tube tip is approximately 6.0 cm from the carina at the level of the clavicular heads. The enteric catheter tip is in the stomach. The cardiomediastinal silhouette is normal. There is no focal consolidation or pulmonary edema. There is no pleural effusion or pneumothorax There is no acute osseous abnormality. IMPRESSION: 1. Endotracheal tube tip at the level of the clavicular heads approximately 6.0 cm from the carina. 2. No focal airspace disease or pleural effusion. Electronically Signed   By: Valetta Mole M.D.   On: 12/15/2022 13:23   DG Abd Portable 1V  Result Date: 12/15/2022 CLINICAL DATA:  Nasal/orogastric tube placement. EXAM: PORTABLE ABDOMEN - 1 VIEW COMPARISON:  CT, 06/27/2020.  Current chest radiograph. FINDINGS: Nasal/orogastric tube passes below the diaphragm, tip in the distal stomach. Normal bowel gas pattern. IMPRESSION: Well-positioned nasal/orogastric tube. Electronically Signed   By: Lajean Manes M.D.   On: 12/15/2022 13:22    Cardiac Studies:  ECG: SR no acute changes normal QT PAC/PVC;s     Telemetry: SR PAC/PVCls   Echo: EF 55% small posterior wall motion ? Old   Medications:    acetaminophen  650 mg Oral Q4H   Or   acetaminophen (TYLENOL) oral liquid 160 mg/5 mL  650 mg Per Tube Q4H   Or   acetaminophen  650 mg Rectal Q4H   Chlorhexidine Gluconate Cloth  6 each Topical Q0600   docusate  100 mg Per Tube BID   famotidine  20 mg Per Tube BID   fentaNYL (SUBLIMAZE) injection  25 mcg Intravenous Once   heparin  5,000 Units Subcutaneous Q8H   insulin aspart  0-20 Units Subcutaneous Q4H   methylPREDNISolone (SOLU-MEDROL) injection  80 mg Intravenous Once   mouth rinse  15 mL Mouth Rinse Q2H   polyethylene glycol  17 g Per Tube Daily   sodium chloride flush  3 mL Intravenous Q12H   sodium chloride flush  3 mL Intravenous Q12H      sodium chloride Stopped (12/16/22 0558)   sodium chloride     epinephrine 2 mcg/min (12/16/22 0600)   fentaNYL infusion INTRAVENOUS 100 mcg/hr (  12/16/22 0600)   magnesium sulfate     midazolam 4 mg/hr (12/16/22 0600)   norepinephrine (LEVOPHED) Adult infusion 5 mcg/min (12/16/22 0600)   propofol (DIPRIVAN) infusion 20 mcg/kg/min (12/16/22 0600)    Assessment/Plan:   Cardiac Arrest:  Shocked x 3 in field Cath with collateralized OM Elevated troponin 900 range not likely primary event as OM appeared chronically occluded with collaterals. EF 55% normal QT no acute ECG changes CTA negative dissection and no proximal PE. Will have EP see pending EEG extubation and neurologic recovery Supportive care, vent neurologic w/u per CCM  Jenkins Rouge 12/16/2022, 9:05 AM

## 2022-12-16 NOTE — Progress Notes (Signed)
EEG complete - results pending 

## 2022-12-17 DIAGNOSIS — I469 Cardiac arrest, cause unspecified: Secondary | ICD-10-CM | POA: Diagnosis not present

## 2022-12-17 LAB — MAGNESIUM: Magnesium: 1.9 mg/dL (ref 1.7–2.4)

## 2022-12-17 LAB — BASIC METABOLIC PANEL
Anion gap: 7 (ref 5–15)
BUN: 15 mg/dL (ref 8–23)
CO2: 25 mmol/L (ref 22–32)
Calcium: 8.5 mg/dL — ABNORMAL LOW (ref 8.9–10.3)
Chloride: 106 mmol/L (ref 98–111)
Creatinine, Ser: 0.85 mg/dL (ref 0.61–1.24)
GFR, Estimated: >60 mL/min (ref >60)
Glucose, Bld: 113 mg/dL — ABNORMAL HIGH (ref 70–99)
Potassium: 3.9 mmol/L (ref 3.5–5.1)
Sodium: 138 mmol/L (ref 135–145)

## 2022-12-17 LAB — URINE CULTURE: Culture: NO GROWTH

## 2022-12-17 LAB — CBC
HCT: 37.3 % — ABNORMAL LOW (ref 39.0–52.0)
Hemoglobin: 12.7 g/dL — ABNORMAL LOW (ref 13.0–17.0)
MCH: 34.7 pg — ABNORMAL HIGH (ref 26.0–34.0)
MCHC: 34 g/dL (ref 30.0–36.0)
MCV: 101.9 fL — ABNORMAL HIGH (ref 80.0–100.0)
Platelets: 100 10*3/uL — ABNORMAL LOW (ref 150–400)
RBC: 3.66 MIL/uL — ABNORMAL LOW (ref 4.22–5.81)
RDW: 12.3 % (ref 11.5–15.5)
WBC: 11.3 10*3/uL — ABNORMAL HIGH (ref 4.0–10.5)
nRBC: 0 % (ref 0.0–0.2)

## 2022-12-17 LAB — HEPATIC FUNCTION PANEL
ALT: 107 U/L — ABNORMAL HIGH (ref 0–44)
AST: 76 U/L — ABNORMAL HIGH (ref 15–41)
Albumin: 3.3 g/dL — ABNORMAL LOW (ref 3.5–5.0)
Alkaline Phosphatase: 59 U/L (ref 38–126)
Bilirubin, Direct: 0.2 mg/dL (ref 0.0–0.2)
Indirect Bilirubin: 1.2 mg/dL — ABNORMAL HIGH (ref 0.3–0.9)
Total Bilirubin: 1.4 mg/dL — ABNORMAL HIGH (ref 0.3–1.2)
Total Protein: 5.9 g/dL — ABNORMAL LOW (ref 6.5–8.1)

## 2022-12-17 LAB — PHOSPHORUS: Phosphorus: 2.3 mg/dL — ABNORMAL LOW (ref 2.5–4.6)

## 2022-12-17 LAB — GLUCOSE, CAPILLARY
Glucose-Capillary: 106 mg/dL — ABNORMAL HIGH (ref 70–99)
Glucose-Capillary: 107 mg/dL — ABNORMAL HIGH (ref 70–99)
Glucose-Capillary: 111 mg/dL — ABNORMAL HIGH (ref 70–99)
Glucose-Capillary: 84 mg/dL (ref 70–99)
Glucose-Capillary: 91 mg/dL (ref 70–99)
Glucose-Capillary: 98 mg/dL (ref 70–99)

## 2022-12-17 LAB — LIPOPROTEIN A (LPA): Lipoprotein (a): 141.8 nmol/L — ABNORMAL HIGH (ref <75.0)

## 2022-12-17 MED ORDER — TRAZODONE HCL 50 MG PO TABS
25.0000 mg | ORAL_TABLET | Freq: Every evening | ORAL | Status: DC | PRN
  Administered 2022-12-21: 50 mg via ORAL
  Filled 2022-12-17: qty 1

## 2022-12-17 MED ORDER — POTASSIUM PHOSPHATES 15 MMOLE/5ML IV SOLN
15.0000 mmol | Freq: Once | INTRAVENOUS | Status: AC
  Administered 2022-12-17: 15 mmol via INTRAVENOUS
  Filled 2022-12-17: qty 5

## 2022-12-17 MED ORDER — LEVOTHYROXINE SODIUM 75 MCG PO TABS
125.0000 ug | ORAL_TABLET | Freq: Every day | ORAL | Status: DC
  Administered 2022-12-18 – 2022-12-22 (×5): 125 ug via ORAL
  Filled 2022-12-17 (×5): qty 1

## 2022-12-17 MED ORDER — LEVOTHYROXINE SODIUM 75 MCG PO TABS: 125.0000 ug | ORAL_TABLET | Freq: Every day | ORAL | Status: DC

## 2022-12-17 MED ORDER — DEXMEDETOMIDINE HCL IN NACL 400 MCG/100ML IV SOLN
0.0000 ug/kg/h | INTRAVENOUS | Status: DC
  Administered 2022-12-17: 0.1 ug/kg/h via INTRAVENOUS
  Administered 2022-12-17: 0.3 ug/kg/h via INTRAVENOUS
  Filled 2022-12-17: qty 100

## 2022-12-17 MED ORDER — ASPIRIN 81 MG PO TBEC
81.0000 mg | DELAYED_RELEASE_TABLET | Freq: Every day | ORAL | Status: DC
  Administered 2022-12-17 – 2022-12-22 (×6): 81 mg via ORAL
  Filled 2022-12-17 (×6): qty 1

## 2022-12-17 MED ORDER — VENLAFAXINE HCL ER 75 MG PO CP24
112.5000 mg | ORAL_CAPSULE | Freq: Every day | ORAL | Status: DC
  Administered 2022-12-17 – 2022-12-22 (×6): 112.5 mg via ORAL
  Filled 2022-12-17 (×6): qty 1

## 2022-12-17 MED ORDER — MAGNESIUM SULFATE 2 GM/50ML IV SOLN
2.0000 g | Freq: Once | INTRAVENOUS | Status: AC
  Administered 2022-12-17: 2 g via INTRAVENOUS
  Filled 2022-12-17: qty 50

## 2022-12-17 MED ORDER — EZETIMIBE 10 MG PO TABS
10.0000 mg | ORAL_TABLET | Freq: Every day | ORAL | Status: DC
  Administered 2022-12-17 – 2022-12-22 (×6): 10 mg via ORAL
  Filled 2022-12-17 (×6): qty 1

## 2022-12-17 MED ORDER — QUETIAPINE FUMARATE 25 MG PO TABS
25.0000 mg | ORAL_TABLET | Freq: Two times a day (BID) | ORAL | Status: DC
  Administered 2022-12-17 – 2022-12-18 (×3): 25 mg via ORAL
  Filled 2022-12-17 (×4): qty 1

## 2022-12-17 MED ORDER — NITROGLYCERIN 0.4 MG SL SUBL
0.4000 mg | SUBLINGUAL_TABLET | SUBLINGUAL | Status: DC | PRN
  Administered 2022-12-18: 0.4 mg via SUBLINGUAL
  Filled 2022-12-17: qty 1

## 2022-12-17 NOTE — Progress Notes (Signed)
Witnessed prolonged period of ventricular standstill and subsequent complete HB w/ PVC's while at bedside. No loss of consciousness although pt did verbalize dizziness. Return to SR without intervention. MD notified.

## 2022-12-17 NOTE — Progress Notes (Signed)
Cardiologist:  [name]   Subjective:  Extubated stable Some anoxic memory issues and disorientation but pleasant    Objective:  Vitals:   12/17/22 0545 12/17/22 0600 12/17/22 0615 12/17/22 0700  BP:  122/75  113/71  Pulse: 64 78 62 65  Resp: 10 14 (!) 9   Temp: 99.1 F (37.3 C) 99.1 F (37.3 C) 99.1 F (37.3 C) 99.1 F (37.3 C)  TempSrc:      SpO2: 100% 100% 100% 100%  Weight:      Height:        Intake/Output from previous day:  Intake/Output Summary (Last 24 hours) at 12/17/2022 9798 Last data filed at 12/17/2022 0700 Gross per 24 hour  Intake 617.71 ml  Output 2180 ml  Net -1562.29 ml    Physical Exam: Extubated NG out  Central line left subclavian Abdomen benign Trace edema Moving all extremities Disoriented to time/place  Lab Results: Basic Metabolic Panel: Recent Labs    12/16/22 0359 12/16/22 1605 12/17/22 0504  NA 135 138 138  K 3.8 4.6 3.9  CL 103  --  106  CO2 15*  --  25  GLUCOSE 235*  --  113*  BUN 22  --  15  CREATININE 1.34*  --  0.85  CALCIUM 8.8*  --  8.5*  MG 2.0  --  1.9  PHOS 2.3*  --  2.3*   Liver Function Tests: Recent Labs    12/16/22 0359 12/17/22 0504  AST 135* 76*  ALT 177* 107*  ALKPHOS 74 59  BILITOT 1.0 1.4*  PROT 6.6 5.9*  ALBUMIN 3.7 3.3*   No results for input(s): "LIPASE", "AMYLASE" in the last 72 hours. CBC: Recent Labs    12/16/22 0359 12/16/22 1605 12/17/22 0504  WBC 8.2  --  11.3*  HGB 14.3 12.9* 12.7*  HCT 41.1 38.0* 37.3*  MCV 99.8  --  101.9*  PLT 167  --  100*    Fasting Lipid Panel: Recent Labs    12/16/22 0359  TRIG 72     Imaging: EEG adult  Result Date: 12/16/2022 Lora Havens, MD     12/16/2022  2:00 PM Patient Name: Bristol Soy Dollens MRN: [id number] Epilepsy Attending: Lora Havens Referring Physician/Provider: Candee Furbish, MD Date: 12/16/2022 Duration: 22.20 mins Patient history: 72 year old male status post cardiac arrest.  EEG to evaluate for seizure. Level  of alertness: comatose AEDs during EEG study: Versed, propofol Technical aspects: This EEG study was done with scalp electrodes positioned according to the 10-20 International system of electrode placement. Electrical activity was reviewed with band pass filter of 1-'70Hz'$ , sensitivity of 7 uV/mm, display speed of 43m/sec with a '60Hz'$  notched filter applied as appropriate. EEG data were recorded continuously and digitally stored.  Video monitoring was available and reviewed as appropriate. Description: EEG showed continuous generalized predominantly 5 to 8 Hz theta slowing admixed with intermittent generalized 2 to 3 Hz delta slowing. Hyperventilation and photic stimulation were not performed.   ABNORMALITY - Continuous slow, generalized IMPRESSION: This study is suggestive of severe diffuse encephalopathy, nonspecific etiology. No seizures or epileptiform discharges were seen throughout the recording. PAtkinsonCATHETERIZATION  Result Date: 12/15/2022   Prox LAD to Mid LAD lesion is 55% stenosed.  Dist LAD lesion is 45% stenosed.   Prox Cx to Mid Cx lesion is 55% stenosed.   1st Mrg (previously placed Drug Eluting stent is 100% stenosed -> CTO with left to left and right  left lateral   Prox RCA lesion is 60% stenosed. Mid RCA lesion is 55% stenosed.   LV end diastolic pressure is low.   There is no aortic valve stenosis. POST-CATH DIAGNOSES Diffuse moderate to severe disease with calcification in all the proximal vessels, and likely CTO of previous OM1 stent filling via collaterals from both left to right and right to left. No obvious culprit lesion to explain cardiac arrest. Low LVEDP of 6 mmHg. A-line pressures were in the high 80s to low 90s compared to cuff . Hypokalemia noted on labs-potassium 2.9.  Was started on IV potassium 10 mg once RECOMMENDATION Monitor clinically for signs of recovery per PCCM. I suspect regional wall motion abnormality on echo was related to the CTO of the OM. Glenetta Hew, MD  CT Angio Chest/Abd/Pel for Dissection W and/or Wo Contrast  Result Date: 12/15/2022 CLINICAL DATA:  Status post cardiac arrest, acute aortic syndrome suspected EXAM: CT ANGIOGRAPHY CHEST, ABDOMEN AND PELVIS TECHNIQUE: Non-contrast CT of the chest was initially obtained. Multidetector CT imaging through the chest, abdomen and pelvis was performed using the standard protocol during bolus administration of intravenous contrast. Multiplanar reconstructed images and MIPs were obtained and reviewed to evaluate the vascular anatomy. RADIATION DOSE REDUCTION: This exam was performed according to the departmental dose-optimization program which includes automated exposure control, adjustment of the mA and/or kV according to patient size and/or use of iterative reconstruction technique. CONTRAST:  23m OMNIPAQUE IOHEXOL 350 MG/ML SOLN COMPARISON:  None Available. FINDINGS: CTA CHEST FINDINGS Cardiovascular: There is no demonstrable mural hematoma in the noncontrast images of thoracic aorta. There is homogeneous enhancement in thoracic aorta. There is no demonstrable intimal flap. Atherosclerotic plaques and calcifications are seen in aortic arch. There is no extravasation of contrast. Major branches of thoracic aorta appear patent. There are no filling defects in central pulmonary artery branches. Coronary artery calcifications are seen. Mediastinum/Nodes: There is no mediastinal hematoma. There is no significant lymphadenopathy. Lungs/Pleura: Small linear infiltrates are seen in the posterior aspects of both lungs. There is no focal consolidation. There are no signs of alveolar pulmonary edema. There is no pleural effusion or pneumothorax. Small pleural plaques are seen in posterior aspects of both apices. Musculoskeletal: There is 20-30% decrease in height of body of T12 vertebra. Schmorl's nodes are seen in endplates of TN27vertebral body, more prominent in the upper endplate. Alignment of posterior  margins of vertebral bodies appears normal. Review of the MIP images confirms the above findings. CTA ABDOMEN AND PELVIS FINDINGS VASCULAR Aorta: There is no evidence of dissection or focal aneurysmal dilation. There are scattered calcifications and atherosclerotic plaques in aorta and its major branches. Celiac: There are scattered calcifications without significant stenosis SMA: There are small plaques and scattered calcifications without significant stenosis. Renals: There are calcifications in the proximal course without significant stenosis. IMA: Patent. Iliacs: There are scattered calcifications and atherosclerotic plaques, more prominent at the bifurcation of right common iliac without significant stenosis. Veins: Unremarkable. Review of the MIP images confirms the above findings. NON-VASCULAR Hepatobiliary: No focal abnormalities are seen in liver. Surgical clips are seen in gallbladder fossa. There is prominence of extrahepatic bile ducts with distal common bile duct in the head of the pancreas measuring 1.3 cm. There is no significant dilation of intrahepatic bile ducts. Pancreas: No focal abnormalities are seen. Spleen: Unremarkable. Adrenals/Urinary Tract: Adrenals are unremarkable. There is contrast in the pelvocaliceal systems limiting evaluation for nonobstructing stones. There is no hydronephrosis. Possible subcentimeter cyst is seen  in the lower pole of right kidney. Ureters are not dilated. There is contrast in the lumen of bladder. Foley catheter is present. Pockets of air in the bladder may be due to recent catheterization. There is diffuse wall thickening in the bladder. Bladder is not distended. Stomach/Bowel: Stomach is moderately distended. Tip of enteric tube is seen in the distal antrum of the stomach. There is fluid in the lumen of slightly dilated distal small bowel loops. Appendix is not dilated. Liquid stool is seen in colon. There is no significant wall thickening in colon. There is no  pericolic stranding. Lymphatic: No significant lymphadenopathy is seen. Reproductive: Unremarkable. Other: Note is no ascites or pneumoperitoneum. There is 4.6 cm fluid density structure immediately behind the anterior abdominal wall close to the right inguinal region. This most likely is a device used for management of erectile dysfunction. Musculoskeletal: Decrease in height of body of T12 vertebra along with Schmorl's nodes may be related to previous injury. As far as seen, no recent fractures are seen in bony structures. Review of the MIP images confirms the above findings. IMPRESSION: There is no evidence of dissection in thoracic and abdominal aorta. Major branches of thoracic and abdominal aorta are patent. Coronary artery disease. Aortic atherosclerosis. There is no evidence of central pulmonary artery embolism. Small linear patchy infiltrates are seen in the posterior aspects of both lungs suggesting subsegmental atelectasis. There is no evidence of intestinal obstruction or pneumoperitoneum. Appendix is not dilated. There is no hydronephrosis. There is fluid in the lumen of slightly dilated distal small bowel loops and in the colon suggesting possible nonspecific enteritis. There is wall thickening in the urinary bladder which may be due to incomplete distention or suggest cystitis. Prominence of extrahepatic bile ducts may be related to previous cholecystectomy. Other findings as described in the body of the report. Electronically Signed   By: Elmer Picker M.D.   On: 12/15/2022 16:56   CT HEAD WO CONTRAST  Result Date: 12/15/2022 CLINICAL DATA:  Provided history: Mental status change, unknown cause. Neck trauma. Additional history provided: Witnessed arrest, status post resuscitation. EXAM: CT HEAD WITHOUT CONTRAST CT CERVICAL SPINE WITHOUT CONTRAST TECHNIQUE: Multidetector CT imaging of the head and cervical spine was performed following the standard protocol without intravenous contrast.  Multiplanar CT image reconstructions of the cervical spine were also generated. RADIATION DOSE REDUCTION: This exam was performed according to the departmental dose-optimization program which includes automated exposure control, adjustment of the mA and/or kV according to patient size and/or use of iterative reconstruction technique. COMPARISON:  Head CT 07/13/2021. Maxillofacial CT 07/13/2021. FINDINGS: CT HEAD FINDINGS Brain: There is no acute intracranial hemorrhage. No demarcated cortical infarct. No extra-axial fluid collection. No evidence of an intracranial mass. No midline shift. Vascular: No hyperdense vessel. Atherosclerotic calcifications. Skull: No fracture or aggressive osseous lesion. Sinuses/Orbits: No orbital mass or acute orbital finding. Small mucous retention cyst within the right maxillary sinus. CT CERVICAL SPINE FINDINGS Alignment: Slight grade 1 anterolisthesis at C2-C3. Slight grade 1 retrolisthesis at C3-C4. Slight grade 1 anterolisthesis at C7-T1. Skull base and vertebrae: The basion-dental and atlanto-dental intervals are maintained.No evidence of acute fracture to the cervical spine. Soft tissues and spinal canal: No prevertebral fluid or swelling. No visible canal hematoma. Disc levels: Cervical spondylosis with multilevel disc space narrowing, disc bulges, endplate spurring and uncovertebral hypertrophy. No appreciable high-grade spinal canal stenosis. Multilevel bony neural foraminal narrowing. Upper chest: No consolidation within the imaged lung apices. No visible pneumothorax. Other: The ET tube  terminates at the level of the clavicular heads. Partially imaged enteric tube. Partially imaged central venous catheter from a left-sided approach. IMPRESSION: CT head: 1. No evidence of acute intracranial abnormality. 2. Please note, a brain MRI would have greater sensitivity for acute hypoxic/ischemic injury. CT cervical spine: 1. No evidence of acute fracture to the cervical spine. 2.  Mild grade 1 spondylolisthesis at C2-C3, C3-C4 and C7-T1. 3. Cervical spondylosis, as described. Electronically Signed   By: Kellie Simmering D.O.   On: 12/15/2022 16:44   CT Cervical Spine Wo Contrast  Result Date: 12/15/2022 CLINICAL DATA:  Provided history: Mental status change, unknown cause. Neck trauma. Additional history provided: Witnessed arrest, status post resuscitation. EXAM: CT HEAD WITHOUT CONTRAST CT CERVICAL SPINE WITHOUT CONTRAST TECHNIQUE: Multidetector CT imaging of the head and cervical spine was performed following the standard protocol without intravenous contrast. Multiplanar CT image reconstructions of the cervical spine were also generated. RADIATION DOSE REDUCTION: This exam was performed according to the departmental dose-optimization program which includes automated exposure control, adjustment of the mA and/or kV according to patient size and/or use of iterative reconstruction technique. COMPARISON:  Head CT 07/13/2021. Maxillofacial CT 07/13/2021. FINDINGS: CT HEAD FINDINGS Brain: There is no acute intracranial hemorrhage. No demarcated cortical infarct. No extra-axial fluid collection. No evidence of an intracranial mass. No midline shift. Vascular: No hyperdense vessel. Atherosclerotic calcifications. Skull: No fracture or aggressive osseous lesion. Sinuses/Orbits: No orbital mass or acute orbital finding. Small mucous retention cyst within the right maxillary sinus. CT CERVICAL SPINE FINDINGS Alignment: Slight grade 1 anterolisthesis at C2-C3. Slight grade 1 retrolisthesis at C3-C4. Slight grade 1 anterolisthesis at C7-T1. Skull base and vertebrae: The basion-dental and atlanto-dental intervals are maintained.No evidence of acute fracture to the cervical spine. Soft tissues and spinal canal: No prevertebral fluid or swelling. No visible canal hematoma. Disc levels: Cervical spondylosis with multilevel disc space narrowing, disc bulges, endplate spurring and uncovertebral hypertrophy.  No appreciable high-grade spinal canal stenosis. Multilevel bony neural foraminal narrowing. Upper chest: No consolidation within the imaged lung apices. No visible pneumothorax. Other: The ET tube terminates at the level of the clavicular heads. Partially imaged enteric tube. Partially imaged central venous catheter from a left-sided approach. IMPRESSION: CT head: 1. No evidence of acute intracranial abnormality. 2. Please note, a brain MRI would have greater sensitivity for acute hypoxic/ischemic injury. CT cervical spine: 1. No evidence of acute fracture to the cervical spine. 2. Mild grade 1 spondylolisthesis at C2-C3, C3-C4 and C7-T1. 3. Cervical spondylosis, as described. Electronically Signed   By: Kellie Simmering D.O.   On: 12/15/2022 16:44   ECHOCARDIOGRAM COMPLETE  Result Date: 12/15/2022    ECHOCARDIOGRAM REPORT   Patient Name:   Aragon MIQUAN TANDON Date of Exam: 12/15/2022 Medical Rec #:  [id number]          Height:       73.0 in Accession #:    [id number]         Weight:       190.0 lb Date of Birth:  1951/10/05           BSA:          2.105 m Patient Age:    1 years           BP:           81/58 mmHg Patient Gender: M                  HR:  79 bpm. Exam Location:  Inpatient Procedure: 2D Echo, Cardiac Doppler and Color Doppler STAT ECHO Indications:    Congestive Heart Failure I50.9  History:        Patient has prior history of Echocardiogram examinations, most                 recent 01/20/2021. Previous Myocardial Infarction and CAD; Risk                 Factors:Dyslipidemia and GERD.  Sonographer:    Bernadene Person RDCS Referring Phys: [id number] [place]  1. Left ventricular ejection fraction, by estimation, is 55%. The left ventricle has low normal function. The left ventricle demonstrates regional wall motion abnormalities (see scoring diagram/findings for description). Left ventricular diastolic parameters were normal.  2. Right ventricular systolic function is mildly reduced. The  right ventricular size is mildly enlarged. Tricuspid regurgitation signal is inadequate for assessing PA pressure.  3. Right atrial size was mildly dilated.  4. The mitral valve is grossly normal. Mild mitral valve regurgitation. No evidence of mitral stenosis.  5. The aortic valve is grossly normal. There is mild thickening of the aortic valve. Aortic valve regurgitation is not visualized. No aortic stenosis is present.  6. The inferior vena cava is normal in size with greater than 50% respiratory variability, suggesting right atrial pressure of 3 mmHg. FINDINGS  Left Ventricle: Left ventricular ejection fraction, by estimation, is 55%. The left ventricle has low normal function. The left ventricle demonstrates regional wall motion abnormalities. The left ventricular internal cavity size was normal in size. There is no left ventricular hypertrophy. Left ventricular diastolic parameters were normal.  LV Wall Scoring: The posterior wall is hypokinetic. Right Ventricle: The right ventricular size is mildly enlarged. No increase in right ventricular wall thickness. Right ventricular systolic function is mildly reduced. Tricuspid regurgitation signal is inadequate for assessing PA pressure. The tricuspid regurgitant velocity is 1.75 m/s, and with an assumed right atrial pressure of 3 mmHg, the estimated right ventricular systolic pressure is 71.2 mmHg. Left Atrium: Left atrial size was normal in size. Right Atrium: Right atrial size was mildly dilated. Pericardium: There is no evidence of pericardial effusion. Mitral Valve: The mitral valve is grossly normal. Mild mitral valve regurgitation. No evidence of mitral valve stenosis. Tricuspid Valve: The tricuspid valve is normal in structure. Tricuspid valve regurgitation is trivial. No evidence of tricuspid stenosis. Aortic Valve: The aortic valve is grossly normal. There is mild thickening of the aortic valve. Aortic valve regurgitation is not visualized. No aortic  stenosis is present. Pulmonic Valve: The pulmonic valve was grossly normal. Pulmonic valve regurgitation is trivial. No evidence of pulmonic stenosis. Aorta: The aortic root is normal in size and structure. Ascending aorta measurements are within normal limits for age when indexed to body surface area. Venous: The inferior vena cava is normal in size with greater than 50% respiratory variability, suggesting right atrial pressure of 3 mmHg. IAS/Shunts: No atrial level shunt detected by color flow Doppler.  LEFT VENTRICLE PLAX 2D LVIDd:         5.30 cm      Diastology LVIDs:         3.70 cm      LV e' medial:    9.03 cm/s LV PW:         0.80 cm      LV E/e' medial:  5.3 LV IVS:        0.90 cm  LV e' lateral:   12.10 cm/s LVOT diam:     2.10 cm      LV E/e' lateral: 4.0 LV SV:         54 LV SV Index:   26 LVOT Area:     3.46 cm  LV Volumes (MOD) LV vol d, MOD A2C: 124.0 ml LV vol d, MOD A4C: 134.0 ml LV vol s, MOD A2C: 49.2 ml LV vol s, MOD A4C: 50.0 ml LV SV MOD A2C:     74.8 ml LV SV MOD A4C:     134.0 ml LV SV MOD BP:      78.8 ml RIGHT VENTRICLE RV S prime:     12.10 cm/s TAPSE (M-mode): 2.5 cm LEFT ATRIUM           Index        RIGHT ATRIUM           Index LA diam:      4.00 cm 1.90 cm/m   RA Area:     24.90 cm LA Vol (A2C): 38.1 ml 18.10 ml/m  RA Volume:   73.50 ml  34.91 ml/m LA Vol (A4C): 46.1 ml 21.90 ml/m  AORTIC VALVE LVOT Vmax:   79.00 cm/s LVOT Vmean:  52.000 cm/s LVOT VTI:    0.155 m  AORTA Ao Root diam: 3.40 cm Ao Asc diam:  3.70 cm MITRAL VALVE               TRICUSPID VALVE MV Area (PHT): 3.42 cm    TR Peak grad:   12.2 mmHg MV Decel Time: 222 msec    TR Vmax:        175.00 cm/s MR Peak grad: 28.5 mmHg MR Mean grad: 23.0 mmHg    SHUNTS MR Vmax:      267.00 cm/s  Systemic VTI:  0.16 m MR Vmean:     235.0 cm/s   Systemic Diam: 2.10 cm MV E velocity: 47.90 cm/s MV A velocity: 49.90 cm/s MV E/A ratio:  0.96 Cherlynn Kaiser MD Electronically signed by Cherlynn Kaiser MD Signature Date/Time:  12/15/2022/2:37:17 PM    Final    DG Chest Port 1 View  Result Date: 12/15/2022 CLINICAL DATA:  ETT placement. EXAM: PORTABLE CHEST 1 VIEW COMPARISON:  Chest radiograph 05/05/2022 FINDINGS: The endotracheal tube tip is approximately 6.0 cm from the carina at the level of the clavicular heads. The enteric catheter tip is in the stomach. The cardiomediastinal silhouette is normal. There is no focal consolidation or pulmonary edema. There is no pleural effusion or pneumothorax There is no acute osseous abnormality. IMPRESSION: 1. Endotracheal tube tip at the level of the clavicular heads approximately 6.0 cm from the carina. 2. No focal airspace disease or pleural effusion. Electronically Signed   By: Valetta Mole M.D.   On: 12/15/2022 13:23   DG Abd Portable 1V  Result Date: 12/15/2022 CLINICAL DATA:  Nasal/orogastric tube placement. EXAM: PORTABLE ABDOMEN - 1 VIEW COMPARISON:  CT, 06/27/2020.  Current chest radiograph. FINDINGS: Nasal/orogastric tube passes below the diaphragm, tip in the distal stomach. Normal bowel gas pattern. IMPRESSION: Well-positioned nasal/orogastric tube. Electronically Signed   By: Lajean Manes M.D.   On: 12/15/2022 13:22    Cardiac Studies:  ECG: SR no acute changes normal QT PAC/PVC;s    Telemetry: SR    Echo: EF 55% small posterior wall motion ? Old   Medications:    Chlorhexidine Gluconate Cloth  6 each Topical Q0600   heparin  5,000 Units Subcutaneous Q8H   insulin aspart  0-20 Units Subcutaneous Q4H   levothyroxine  125 mcg Per Tube Q0600   sodium chloride flush  3 mL Intravenous Q12H   sodium chloride flush  3 mL Intravenous Q12H      potassium PHOSPHATE IVPB (in mmol) 15 mmol (12/17/22 0920)    Assessment/Plan:   Cardiac Arrest:  Shocked x 3 in field Cath with collateralized OM Elevated troponin 900 range not likely primary event as OM appeared chronically occluded with collaterals. EF 55% normal QT no acute ECG changes CTA negative dissection and no  proximal PE. EP to see now that he is extubated regarding need for AICD Note K was 2.9 on admission may have contributed to arrhythmia  Anoxic Brain Injury:  EEG with diffuse slowing CT negative per CCM/neurology  CAD:  stable on cath this admission add ASA/Zetia back   Jenkins Rouge 12/17/2022, 9:27 AM

## 2022-12-17 NOTE — Progress Notes (Signed)
[facility] ADULT ICU REPLACEMENT PROTOCOL   The patient does apply for the [facility] Adult ICU Electrolyte Replacment Protocol based on the criteria listed below:   1.Exclusion criteria: TCTS, ECMO, Dialysis, and Myasthenia Gravis patients 2. Is GFR >/= 30 ml/min? Yes.    Patient's GFR today is >60 3. Is SCr </= 2? Yes.   Patient's SCr is 0.85 mg/dL 4. Did SCr increase >/= 0.5 in 24 hours? No. 5.Pt's weight >40kg  Yes.   6. Abnormal electrolyte(s): Phos 2.3, mag 1.9  7. Electrolytes replaced per protocol 8.  Call MD STAT for K+ </= 2.5, Phos </= 1, or Mag </= 1 Physician:  Ed Blalock [facility] 12/17/2022 6:34 AM

## 2022-12-17 NOTE — Consult Note (Signed)
Cardiology Consultation   Patient ID: Matthew Pratt MRN: [id number]; DOB: March 17, 1951  Admit date: 12/15/2022 Date of Consult: 12/17/2022  PCP:  [name], MD   [facility] Providers Cardiologist:  Elouise Munroe, MD    Patient Profile:   Matthew Pratt is a 72 y.o. male with a hx of CAD (prior STEMI 2011 w/PCI to OM1), BPH, HLD, OSA w/CPAP who is being seen 12/17/2022 for the evaluation of cardiac arrest and CHB at the request of Dr. Johnsie Cancel.  History of Present Illness:   Mr. Stefanski saw Dr. Alba Cory last 12/15/22, feeling well, no anginal symptoms, not on BB 2/2 h/o bradycardia.   No changes were made.  Admitted ;ater the same day at home (next door t a fire station) a Airline pilot saw him collapse, by chart record, started CPR immediately.  Did CPR for 8 minutes and delivered 3 defibrillatory shocks.  By the time EMS had arrived, ROSC had been achieved. Intubated in the field Presenting HS Trop only 60  LHC noted a CTO of the OM, with collaterals and non-obstructive disease otherwise, TTE noted preserved LVEF   EMS reviewed Pt on their arrival was unresponsive though had pulse and BP Reported AED defibrillations x3 prior to their arrival No ACSL meds   LABS K+ 4.3 on admission >4.4 > 3.6 > 2.9 > 2.9 > 3.2 >> 4.6 today Mag 2.0 >>> 1.9 BUN 18 >>> 15 Creat 1.55 >>> 0.85 AST 135 > 76 ALT 177 >> 107  CBC today 11.3 H/H 12.7/37.3 Plts 100   HS Trop 60 > 910 > 594 > 518  Extubated yesterday 12/16/22 He was on ephedrine gtt until yesterday AM Levophed stopped early this AM No AAD or BB   TODAY he had back to back V pausing/standstill events w/CHB 11.4 sec > 5.1 0 seconds None prior to or since Reportedly they were getting him out of bed   Oriented only to self Denies any active complaints or concerns, no pain, no SOB Does not know where he is or why.    Past Medical History:  Diagnosis Date   Anxiety    Arthritis    BPH (benign  prostatic hypertrophy)    CAD (coronary artery disease)    s/p STEMI 10/15/10 w/ Promus DES placed x1 in the first OM   Colon polyps    Complication of anesthesia    pt. reported 2015 penile inplant when getting some anesthesia had chest pain,they readjusted  the meds. and continued with surgery. Had no further problems di not have to follow up with anyone.   Depression    Depression with anxiety    Dupuytren's contracture of hand    ED (erectile dysfunction)    GERD (gastroesophageal reflux disease)    Glaucoma    HLD (hyperlipidemia)    Hypocontractile bladder    Hypothyroidism    MI (myocardial infarction) ([place])    denies   OSA on CPAP    Peyronie's disease    Rosacea    Skin cancer    BCC    Past Surgical History:  Procedure Laterality Date   CHOLECYSTECTOMY N/A 02/10/2021   Procedure: LAPAROSCOPIC CHOLECYSTECTOMY;  Surgeon: Coralie Keens, MD;  Location: [facility];  Service: General;  Laterality: N/A;   COLONOSCOPY WITH ESOPHAGOGASTRODUODENOSCOPY (EGD)     multiple   CORONARY STENT PLACEMENT     INNER EAR SURGERY     LAPAROSCOPIC CHOLECYSTECTOMY Bilateral 02/09/2021   Dr.blackmon   LASIK  02/1998   LEFT HEART CATH AND CORONARY ANGIOGRAPHY N/A 12/15/2022   Procedure: LEFT HEART CATH AND CORONARY ANGIOGRAPHY;  Surgeon: Leonie Man, MD;  Location: [facility];  Service: Cardiovascular;  Laterality: N/A;   PENILE PROSTHESIS IMPLANT     TONSILECTOMY, ADENOIDECTOMY, BILATERAL MYRINGOTOMY AND TUBES     TONSILLECTOMY     VASECTOMY     WISDOM TOOTH EXTRACTION       Home Medications:  Prior to Admission medications   Medication Sig Start Date End Date Taking? Authorizing Provider  acetaminophen (TYLENOL) 500 MG tablet Take 500 mg by mouth every 6 (six) hours as needed for mild pain.   Yes [provider]  aspirin EC 81 MG tablet Take 81 mg by mouth every morning.   Yes [provider]  Digestive Enzymes (SUPER ENZYMES PO) Take 1 capsule by mouth  daily.   Yes [provider]  ezetimibe (ZETIA) 10 MG tablet Take 10 mg by mouth daily.   Yes [provider]  nitroGLYCERIN (NITROSTAT) 0.4 MG SL tablet Place 1 tablet (0.4 mg total) under the tongue every 5 (five) minutes as needed for chest pain. 02/23/22 12/17/22 Yes Cleaver, Jossie Ng, NP  Selenium 100 MCG TABS Take 100 mcg by mouth daily.   Yes [provider]  senna (SENOKOT) 8.6 MG tablet Take 1 tablet by mouth as needed for constipation.   Yes [provider]  SYNTHROID 125 MCG tablet TAKE 1 TABLET BY MOUTH EVERY DAY 11/22/22  Yes Burchette, Alinda Sierras, MD  timolol (TIMOPTIC) 0.5 % ophthalmic solution Place 1 drop into both eyes 2 (two) times daily.   Yes [provider]  traZODone (DESYREL) 50 MG tablet TAKE 1/2 TO 1 TABLET BY MOUTH AT NIGHT AS NEEDED FOR INSOMNIA Patient taking differently: Take 25-50 mg by mouth at bedtime as needed (insomnia). 12/28/21  Yes Burchette, Alinda Sierras, MD  TURMERIC PO Take 1,500 mg by mouth daily.   Yes [provider]  venlafaxine XR (EFFEXOR-XR) 37.5 MG 24 hr capsule TAKE 3 CAPSULES BY MOUTH EVERY DAY Patient taking differently: Take 112.5 mg by mouth daily. 10/18/22  Yes Burchette, Alinda Sierras, MD  zinc gluconate 50 MG tablet Take 50 mg by mouth daily at 6 PM.   Yes [provider]    Inpatient Medications: Scheduled Meds:  aspirin EC  81 mg Oral Daily   Chlorhexidine Gluconate Cloth  6 each Topical Q0600   ezetimibe  10 mg Oral Daily   heparin  5,000 Units Subcutaneous Q8H   insulin aspart  0-20 Units Subcutaneous Q4H   levothyroxine  125 mcg Per Tube Q0600   sodium chloride flush  3 mL Intravenous Q12H   sodium chloride flush  3 mL Intravenous Q12H   Continuous Infusions:  potassium PHOSPHATE IVPB (in mmol) 43 mL/hr at 12/17/22 1100   PRN Meds: haloperidol lactate, ondansetron (ZOFRAN) IV, sodium chloride flush  Allergies:    Allergies  Allergen Reactions   Bee Venom Hives and Swelling    Crestor [Rosuvastatin] Other (See Comments)    Myalgia.     Iodinated Contrast Media Hives and Itching   Sulfa Antibiotics Rash   Sulfonamide Derivatives Rash    Social History:   Social History   Socioeconomic History   Marital status: Divorced    Spouse name: Not on file   Number of children: 2   Years of education: Not on file   Highest education level: Bachelor's degree (e.g., BA, AB, BS)  Occupational History   Occupation: International aid/development worker    Comment: retired  Tobacco Use   Smoking status: Never   Smokeless tobacco: Never  Vaping Use   Vaping Use: Never used  Substance and Sexual Activity   Alcohol use: No   Drug use: No   Sexual activity: Not on file  Other Topics Concern   Not on file  Social History Narrative   Divorced, 2 children   Retired Chief Financial Officer   No EtOH, tobacco, drugs   Social Determinants of Health   Financial Resource Strain: Medium Risk (04/19/2022)   Overall Financial Resource Strain (CARDIA)    Difficulty of Paying Living Expenses: Somewhat hard  Food Insecurity: No Food Insecurity (12/16/2022)   Hunger Vital Sign    Worried About Running Out of Food in the Last Year: Never true    [place] in the Last Year: Never true  Transportation Needs: No Transportation Needs (12/16/2022)   PRAPARE - Hydrologist (Medical): No    Lack of Transportation (Non-Medical): No  Physical Activity: Inactive (04/19/2022)   Exercise Vital Sign    Days of Exercise per Week: 0 days    Minutes of Exercise per Session: 0 min  Stress: Stress Concern Present (04/19/2022)   [place]    Feeling of Stress : Very much  Social Connections: Moderately Isolated (04/19/2022)   Social Connection and Isolation Panel [NHANES]    Frequency of Communication with Friends and Family: Never    Frequency of Social Gatherings with Friends and Family: Once a week     Attends Religious Services: More than 4 times per year    Active Member of [employer] or Organizations: Yes    Attends Music therapist: More than 4 times per year    Marital Status: Divorced  Intimate Partner Violence: Not At Risk (12/16/2022)   Humiliation, Afraid, Rape, and Kick questionnaire    Fear of Current or Ex-Partner: No    Emotionally Abused: No    Physically Abused: No    Sexually Abused: No    Family History:   Family History  Problem Relation Age of Onset   COPD Mother    Cancer Father        gallbladder cancer   Varicose Veins Father    Liver cancer Father    Basal cell carcinoma Father    Basal cell carcinoma Sister    Melanoma Sister    Rheum arthritis Maternal Grandmother    Heart failure Maternal Grandfather    Colon cancer Paternal Grandmother    Arthritis Paternal Grandfather    Colon cancer Paternal Grandfather    COPD Other      ROS:  Please see the history of present illness.  All other ROS reviewed and negative.     Physical Exam/Data:   Vitals:   12/17/22 0900 12/17/22 1000 12/17/22 1100 12/17/22 1200  BP: 125/72 (!) 148/82 136/78 (!) 154/76  Pulse: 63 64 65 66  Resp: '10 17 11 10  '$ Temp: 99 F (37.2 C) 99 F (37.2 C) 99.1 F (37.3 C) 99.1 F (37.3 C)  TempSrc:      SpO2: 99% 100% 100% 100%  Weight:      Height:        Intake/Output Summary (Last 24 hours) at 12/17/2022 1310 Last data filed at 12/17/2022 1200 Gross per 24 hour  Intake 457.39 ml  Output 2490 ml  Net -2032.61 ml      12/17/2022    5:00 AM 12/16/2022    3:55 AM 12/15/2022   10:00 PM  Last 3 Weights  Weight (lbs) 198 lb 13.7 oz 198 lb 13.7 oz 198 lb 13.7 oz  Weight (kg) 90.2 kg 90.2 kg 90.2 kg     Body mass index is 26.24 kg/m.  General:  Well nourished, well developed, in no acute distress HEENT: abrasion R tip head Neck: no JVD Vascular: No carotid bruits Cardiac:  RRR; no murmurs gallops or rubs Lungs:  CTA b/l, no wheezing, rhonchi or rales  Abd:  soft, nontender Ext: no edema Musculoskeletal:  No deformities Skin: warm and dry  Neuro:  AAO x1 only, no gross focal motor abnormalities noted Psych:  Normal affect   EKG:  The EKG was personally reviewed and demonstrates:    EMS EKGS reviewed, had ST depression s V2-6 with slight elevation aVL Resolvibg with serial EKGs Initial EKGs noted PVCs as well  HERE: SR 90bpm, without significant ST/T changes, normal intervals   Telemetry:  Telemetry was personally reviewed and demonstrates:   SR 60's infrequent PVCs, rare couplet Today he has CHB associated with V standstill of 11.4 followed by 5.10 seconds  Relevant CV Studies:  12/15/22: TTE 1. Left ventricular ejection fraction, by estimation, is 55%. The left  ventricle has low normal function. The left ventricle demonstrates  regional wall motion abnormalities (see scoring diagram/findings for  description). Left ventricular diastolic  parameters were normal.   2. Right ventricular systolic function is mildly reduced. The right  ventricular size is mildly enlarged. Tricuspid regurgitation signal is  inadequate for assessing PA pressure.   3. Right atrial size was mildly dilated.   4. The mitral valve is grossly normal. Mild mitral valve regurgitation.  No evidence of mitral stenosis.   5. The aortic valve is grossly normal. There is mild thickening of the  aortic valve. Aortic valve regurgitation is not visualized. No aortic  stenosis is present.   6. The inferior vena cava is normal in size with greater than 50%  respiratory variability, suggesting right atrial pressure of 3 mmHg.     12/15/22: LHC  Prox LAD to Mid LAD lesion is 55% stenosed.  Dist LAD lesion is 45% stenosed.   Prox Cx to Mid Cx lesion is 55% stenosed.   1st Mrg (previously placed Drug Eluting stent is 100% stenosed -> CTO with left to left and right left lateral   Prox RCA lesion is 60% stenosed. Mid RCA lesion is 55% stenosed.   LV end diastolic  pressure is low.   There is no aortic valve stenosis.   POST-CATH DIAGNOSES Diffuse moderate to severe disease with calcification in all the proximal vessels, and likely CTO of previous OM1 stent filling via collaterals from both left to right and right to left. No obvious culprit lesion to explain cardiac arrest. Low LVEDP of 6 mmHg. A-line pressures were in the high 80s to low 90s compared to cuff . Hypokalemia noted on labs-potassium 2.9.  Was started on IV potassium 10 mg once     RECOMMENDATION Monitor clinically for signs of recovery per PCCM. I suspect regional wall motion abnormality on echo was related to the CTO of the OM.  Laboratory Data:  High Sensitivity Troponin:   Recent Labs  Lab 12/15/22 1310 12/15/22 2115 12/16/22 0251 12/16/22 0359  TROPONINIHS 60* 910* 594* 518*     Chemistry Recent Labs  Lab 12/15/22 2350  12-22-2022 0359 22-Dec-2022 1605 12/17/22 0504  NA 137 135 138 138  K 3.2* 3.8 4.6 3.9  CL 102 103  --  106  CO2 11* 15*  --  25  GLUCOSE 265* 235*  --  113*  BUN 22 22  --  15  CREATININE 1.56* 1.34*  --  0.85  CALCIUM 8.7* 8.8*  --  8.5*  MG 2.0 2.0  --  1.9  GFRNONAA 47* 56*  --  >60  ANIONGAP 24* 17*  --  7    Recent Labs  Lab 12/15/22 1310 12-22-22 0359 12/17/22 0504  PROT 6.7 6.6 5.9*  ALBUMIN 3.8 3.7 3.3*  AST 83* 135* 76*  ALT 82* 177* 107*  ALKPHOS 80 74 59  BILITOT 1.3* 1.0 1.4*   Lipids  Recent Labs  Lab 12/22/2022 0359  TRIG 72    Hematology Recent Labs  Lab 12/15/22 2115 December 22, 2022 0359 2022/12/22 1605 12/17/22 0504  WBC 8.3 8.2  --  11.3*  RBC 4.14* 4.12*  --  3.66*  HGB 14.8 14.3 12.9* 12.7*  HCT 41.3 41.1 38.0* 37.3*  MCV 99.8 99.8  --  101.9*  MCH 35.7* 34.7*  --  34.7*  MCHC 35.8 34.8  --  34.0  RDW 11.9 12.0  --  12.3  PLT 180 167  --  100*   Thyroid No results for input(s): "TSH", "FREET4" in the last 168 hours.  BNPNo results for input(s): "BNP", "PROBNP" in the last 168 hours.  DDimer No results for  input(s): "DDIMER" in the last 168 hours.   Radiology/Studies:  EEG adult Result Date: 22-Dec-2022 Lora Havens, MD     2022-12-22  2:00 PM Patient Name: Bing Duffey [place] MRN: [id number] Epilepsy Attending: Lora Havens Referring Physician/Provider: Candee Furbish, MD Date: 22-Dec-2022 Duration: 22.20 mins Patient history: 72 year old male status post cardiac arrest.  EEG to evaluate for seizure. Level of alertness: comatose AEDs during EEG study: Versed, propofol Technical aspects: This EEG study was done with scalp electrodes positioned according to the 10-20 International system of electrode placement. Electrical activity was reviewed with band pass filter of 1-'70Hz'$ , sensitivity of 7 uV/mm, display speed of 66m/sec with a '60Hz'$  notched filter applied as appropriate. EEG data were recorded continuously and digitally stored.  Video monitoring was available and reviewed as appropriate. Description: EEG showed continuous generalized predominantly 5 to 8 Hz theta slowing admixed with intermittent generalized 2 to 3 Hz delta slowing. Hyperventilation and photic stimulation were not performed.   ABNORMALITY - Continuous slow, generalized IMPRESSION: This study is suggestive of severe diffuse encephalopathy, nonspecific etiology. No seizures or epileptiform discharges were seen throughout the recording. PLora Havens   CT Angio Chest/Abd/Pel for Dissection W and/or Wo Contrast Result Date: 12/15/2022 CLINICAL DATA:  Status post cardiac arrest, acute aortic syndrome suspected EXAM: CT ANGIOGRAPHY CHEST, ABDOMEN AND PELVIS TECHNIQUE: Non-contrast CT of the chest was initially obtained. Multidetector CT imaging through the chest, abdomen and pelvis was performed using the standard protocol during bolus administration of intravenous contrast. Multiplanar reconstructed images and MIPs were obtained and reviewed to evaluate the vascular anatomy. RADIATION DOSE REDUCTION: This exam was performed according to  the departmental dose-optimization program which includes automated exposure control, adjustment of the mA and/or kV according to patient size and/or use of iterative reconstruction technique. CONTRAST:  749mOMNIPAQUE IOHEXOL 350 MG/ML SOLN COMPARISON:  None Available. FINDINGS: CTA CHEST FINDINGS Cardiovascular: There is no demonstrable mural hematoma in the noncontrast images of  thoracic aorta. There is homogeneous enhancement in thoracic aorta. There is no demonstrable intimal flap. Atherosclerotic plaques and calcifications are seen in aortic arch. There is no extravasation of contrast. Major branches of thoracic aorta appear patent. There are no filling defects in central pulmonary artery branches. Coronary artery calcifications are seen. Mediastinum/Nodes: There is no mediastinal hematoma. There is no significant lymphadenopathy. Lungs/Pleura: Small linear infiltrates are seen in the posterior aspects of both lungs. There is no focal consolidation. There are no signs of alveolar pulmonary edema. There is no pleural effusion or pneumothorax. Small pleural plaques are seen in posterior aspects of both apices. Musculoskeletal: There is 20-30% decrease in height of body of T12 vertebra. Schmorl's nodes are seen in endplates of L87 vertebral body, more prominent in the upper endplate. Alignment of posterior margins of vertebral bodies appears normal. Review of the MIP images confirms the above findings. CTA ABDOMEN AND PELVIS FINDINGS VASCULAR Aorta: There is no evidence of dissection or focal aneurysmal dilation. There are scattered calcifications and atherosclerotic plaques in aorta and its major branches. Celiac: There are scattered calcifications without significant stenosis SMA: There are small plaques and scattered calcifications without significant stenosis. Renals: There are calcifications in the proximal course without significant stenosis. IMA: Patent. Iliacs: There are scattered calcifications and  atherosclerotic plaques, more prominent at the bifurcation of right common iliac without significant stenosis. Veins: Unremarkable. Review of the MIP images confirms the above findings. NON-VASCULAR Hepatobiliary: No focal abnormalities are seen in liver. Surgical clips are seen in gallbladder fossa. There is prominence of extrahepatic bile ducts with distal common bile duct in the head of the pancreas measuring 1.3 cm. There is no significant dilation of intrahepatic bile ducts. Pancreas: No focal abnormalities are seen. Spleen: Unremarkable. Adrenals/Urinary Tract: Adrenals are unremarkable. There is contrast in the pelvocaliceal systems limiting evaluation for nonobstructing stones. There is no hydronephrosis. Possible subcentimeter cyst is seen in the lower pole of right kidney. Ureters are not dilated. There is contrast in the lumen of bladder. Foley catheter is present. Pockets of air in the bladder may be due to recent catheterization. There is diffuse wall thickening in the bladder. Bladder is not distended. Stomach/Bowel: Stomach is moderately distended. Tip of enteric tube is seen in the distal antrum of the stomach. There is fluid in the lumen of slightly dilated distal small bowel loops. Appendix is not dilated. Liquid stool is seen in colon. There is no significant wall thickening in colon. There is no pericolic stranding. Lymphatic: No significant lymphadenopathy is seen. Reproductive: Unremarkable. Other: Note is no ascites or pneumoperitoneum. There is 4.6 cm fluid density structure immediately behind the anterior abdominal wall close to the right inguinal region. This most likely is a device used for management of erectile dysfunction. Musculoskeletal: Decrease in height of body of T12 vertebra along with Schmorl's nodes may be related to previous injury. As far as seen, no recent fractures are seen in bony structures. Review of the MIP images confirms the above findings. IMPRESSION: There is no  evidence of dissection in thoracic and abdominal aorta. Major branches of thoracic and abdominal aorta are patent. Coronary artery disease. Aortic atherosclerosis. There is no evidence of central pulmonary artery embolism. Small linear patchy infiltrates are seen in the posterior aspects of both lungs suggesting subsegmental atelectasis. There is no evidence of intestinal obstruction or pneumoperitoneum. Appendix is not dilated. There is no hydronephrosis. There is fluid in the lumen of slightly dilated distal small bowel loops and in the colon  suggesting possible nonspecific enteritis. There is wall thickening in the urinary bladder which may be due to incomplete distention or suggest cystitis. Prominence of extrahepatic bile ducts may be related to previous cholecystectomy. Other findings as described in the body of the report. Electronically Signed   By: Elmer Picker M.D.   On: 12/15/2022 16:56   CT HEAD WO CONTRAST Result Date: 12/15/2022 CLINICAL DATA:  Provided history: Mental status change, unknown cause. Neck trauma. Additional history provided: Witnessed arrest, status post resuscitation. EXAM: CT HEAD WITHOUT CONTRAST CT CERVICAL SPINE WITHOUT CONTRAST TECHNIQUE: Multidetector CT imaging of the head and cervical spine was performed following the standard protocol without intravenous contrast. Multiplanar CT image reconstructions of the cervical spine were also generated. RADIATION DOSE REDUCTION: This exam was performed according to the departmental dose-optimization program which includes automated exposure control, adjustment of the mA and/or kV according to patient size and/or use of iterative reconstruction technique. COMPARISON:  Head CT 07/13/2021. Maxillofacial CT 07/13/2021. FINDINGS: CT HEAD FINDINGS Brain: There is no acute intracranial hemorrhage. No demarcated cortical infarct. No extra-axial fluid collection. No evidence of an intracranial mass. No midline shift. Vascular: No  hyperdense vessel. Atherosclerotic calcifications. Skull: No fracture or aggressive osseous lesion. Sinuses/Orbits: No orbital mass or acute orbital finding. Small mucous retention cyst within the right maxillary sinus. CT CERVICAL SPINE FINDINGS Alignment: Slight grade 1 anterolisthesis at C2-C3. Slight grade 1 retrolisthesis at C3-C4. Slight grade 1 anterolisthesis at C7-T1. Skull base and vertebrae: The basion-dental and atlanto-dental intervals are maintained.No evidence of acute fracture to the cervical spine. Soft tissues and spinal canal: No prevertebral fluid or swelling. No visible canal hematoma. Disc levels: Cervical spondylosis with multilevel disc space narrowing, disc bulges, endplate spurring and uncovertebral hypertrophy. No appreciable high-grade spinal canal stenosis. Multilevel bony neural foraminal narrowing. Upper chest: No consolidation within the imaged lung apices. No visible pneumothorax. Other: The ET tube terminates at the level of the clavicular heads. Partially imaged enteric tube. Partially imaged central venous catheter from a left-sided approach. IMPRESSION: CT head: 1. No evidence of acute intracranial abnormality. 2. Please note, a brain MRI would have greater sensitivity for acute hypoxic/ischemic injury. CT cervical spine: 1. No evidence of acute fracture to the cervical spine. 2. Mild grade 1 spondylolisthesis at C2-C3, C3-C4 and C7-T1. 3. Cervical spondylosis, as described. Electronically Signed   By: Kellie Simmering D.O.   On: 12/15/2022 16:44   CT Cervical Spine Wo Contrast Result Date: 12/15/2022 CLINICAL DATA:  Provided history: Mental status change, unknown cause. Neck trauma. Additional history provided: Witnessed arrest, status post resuscitation. EXAM: CT HEAD WITHOUT CONTRAST CT CERVICAL SPINE WITHOUT CONTRAST TECHNIQUE: Multidetector CT imaging of the head and cervical spine was performed following the standard protocol without intravenous contrast. Multiplanar CT  image reconstructions of the cervical spine were also generated. RADIATION DOSE REDUCTION: This exam was performed according to the departmental dose-optimization program which includes automated exposure control, adjustment of the mA and/or kV according to patient size and/or use of iterative reconstruction technique. COMPARISON:  Head CT 07/13/2021. Maxillofacial CT 07/13/2021. FINDINGS: CT HEAD FINDINGS Brain: There is no acute intracranial hemorrhage. No demarcated cortical infarct. No extra-axial fluid collection. No evidence of an intracranial mass. No midline shift. Vascular: No hyperdense vessel. Atherosclerotic calcifications. Skull: No fracture or aggressive osseous lesion. Sinuses/Orbits: No orbital mass or acute orbital finding. Small mucous retention cyst within the right maxillary sinus. CT CERVICAL SPINE FINDINGS Alignment: Slight grade 1 anterolisthesis at C2-C3. Slight grade 1 retrolisthesis at  C3-C4. Slight grade 1 anterolisthesis at C7-T1. Skull base and vertebrae: The basion-dental and atlanto-dental intervals are maintained.No evidence of acute fracture to the cervical spine. Soft tissues and spinal canal: No prevertebral fluid or swelling. No visible canal hematoma. Disc levels: Cervical spondylosis with multilevel disc space narrowing, disc bulges, endplate spurring and uncovertebral hypertrophy. No appreciable high-grade spinal canal stenosis. Multilevel bony neural foraminal narrowing. Upper chest: No consolidation within the imaged lung apices. No visible pneumothorax. Other: The ET tube terminates at the level of the clavicular heads. Partially imaged enteric tube. Partially imaged central venous catheter from a left-sided approach. IMPRESSION: CT head: 1. No evidence of acute intracranial abnormality. 2. Please note, a brain MRI would have greater sensitivity for acute hypoxic/ischemic injury. CT cervical spine: 1. No evidence of acute fracture to the cervical spine. 2. Mild grade 1  spondylolisthesis at C2-C3, C3-C4 and C7-T1. 3. Cervical spondylosis, as described. Electronically Signed   By: Kellie Simmering D.O.   On: 12/15/2022 16:44    DG chest Port 1 View Result Date: 12/15/2022 CLINICAL DATA:  ETT placement. EXAM: PORTABLE CHEST 1 VIEW COMPARISON:  Chest radiograph 05/05/2022 FINDINGS: The endotracheal tube tip is approximately 6.0 cm from the carina at the level of the clavicular heads. The enteric catheter tip is in the stomach. The cardiomediastinal silhouette is normal. There is no focal consolidation or pulmonary edema. There is no pleural effusion or pneumothorax There is no acute osseous abnormality. IMPRESSION: 1. Endotracheal tube tip at the level of the clavicular heads approximately 6.0 cm from the carina. 2. No focal airspace disease or pleural effusion. Electronically Signed   By: Valetta Mole M.D.   On: 12/15/2022 13:23     Assessment and Plan:   Cardiac arrest EMS reports PTA their arrival defibrillated 3x by AED (with fire rescue record) and by our records, got CPR 8 minutes. No ACLS drugs noted  Stable CAD Preserved LVEF Keep electrolytes replenished  2. CHB Normal baseline intervals with no evidence of conduction system disease The patient was in bed, up about 45degrees by RN report, she had just pulled his art line (radial) when the event happened, no full LOC  He will need a device, anticipate dual chamber ICD Pacer/zoll pads are in place He has eaten today, I do not think he is ready for discharge in the next day or tow. Should he have more long pauses, may need a temp wire  Avoid nodal blocking agents  EP MD will see later this afternoon    Risk Assessment/Risk Scores:    For questions or updates, please contact [place] Please consult [email or web address] for contact info under    Signed, Baldwin [place], PA-C  12/17/2022 1:10 PM;

## 2022-12-17 NOTE — TOC Initial Note (Signed)
Transition of Care [facility]) - Initial/Assessment Note    Patient Details  Name: Matthew Pratt MRN: [id number] Date of Birth: Apr 05, 1951  Transition of Care [facility]) CM/SW Contact:    Erenest Rasher, RN Phone Number: [phone] 12/17/2022, 2:47 PM  Clinical Narrative:                 TOC CM spoke to pt and states he lives alone. He was independent PTA. Gave permission to contact sister, Matthew Pratt. Attempted call to sister. States she would not be able to assist with care due other family obligation. States his sonJosephine, Rudnick # [phone] will be her tomorrow.  Will need PT/OT evaluation and recommendations.   Expected Discharge Plan: IP Rehab Facility Barriers to Discharge: Continued Medical Work up   Patient Goals and CMS Choice Patient states their goals for this hospitalization and ongoing recovery are:: wants to get back to his baseline          Expected Discharge Plan and Services   Discharge Planning Services: CM Consult   Living arrangements for the past 2 months: Single Family Home                                      Prior Living Arrangements/Services Living arrangements for the past 2 months: Single Family Home Lives with:: Self Patient language and need for interpreter reviewed:: Yes        Need for Family Participation in Patient Care: Yes (Comment) Care giver support system in place?: Yes (comment)   Criminal Activity/Legal Involvement Pertinent to Current Situation/Hospitalization: No - Comment as needed  Activities of Daily Living Home Assistive Devices/Equipment: None ADL Screening (condition at time of admission) Patient's cognitive ability adequate to safely complete daily activities?: Yes Is the patient deaf or have difficulty hearing?: No Does the patient have difficulty seeing, even when wearing glasses/contacts?: No Does the patient have difficulty concentrating, remembering, or making decisions?: No Patient able to express need for  assistance with ADLs?: Yes Does the patient have difficulty dressing or bathing?: No Independently performs ADLs?: Yes (appropriate for developmental age) Does the patient have difficulty walking or climbing stairs?: No Weakness of Legs: None Weakness of Arms/Hands: None  Permission Sought/Granted Permission sought to share information with : Case Manager, Family Supports, PCP Permission granted to share information with : Yes, Verbal Permission Granted  Share Information with NAME: Matthew Pratt granted to share info w Relationship: sister  Permission granted to share info w Contact Information: [phone]  Emotional Assessment Appearance:: Appears stated age Attitude/Demeanor/Rapport: Engaged Affect (typically observed): Accepting Orientation: : Oriented to Self, Fluctuating Orientation (Suspected and/or reported Sundowners)      Admission diagnosis:  Cardiac arrest [facility]) [I46.9] Patient Active Problem List   Diagnosis Date Noted   Cardiac arrest ([place]) 12/15/2022   CAD S/P percutaneous coronary angioplasty 12/15/2022   Chronic insomnia 09/30/2021   Statin myopathy 03/25/2021   S/P laparoscopic cholecystectomy 02/10/2021   Laryngitis from reflux of stomach acid 11/11/2020   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 10/28/2018   Irritable bowel syndrome with diarrhea 10/28/2018   Hypothyroid 07/26/2018   GERD (gastroesophageal reflux disease) 07/26/2018   Barrett's esophagus 07/26/2018   Rosacea 07/26/2018   Medicare annual wellness visit, initial 02/24/2018   Dupuytren's  contracture of hand 06/23/2017   H/O acute myocardial infarction 06/16/2017   Fatigue 06/16/2017   Benign prostatic hyperplasia with urinary hesitancy 06/16/2017   OSA (obstructive sleep apnea) 12/03/2016   Depression, recurrent ([place]) 12/03/2016   Heartburn 10/15/2016   Moderate episode of recurrent major depressive disorder ([place]) 07/22/2016   Atypical chest pain 12/26/2015   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   Erectile dysfunction due to arterial insufficiency 03/26/2015   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   Hyperlipidemia 10/26/2010   DEPRESSION/ANXIETY 10/26/2010   GLAUCOMA 10/26/2010   MYOCARDIAL INFARCTION, ACUTE, INFEROLATERAL 10/26/2010   CAD, NATIVE VESSEL 10/26/2010   SINUS BRADYCARDIA 10/26/2010   Glaucoma 10/26/2010   PCP:  [name], MD Pharmacy:   [id number] [id number]: [phone]: [phone] [id number]: [phone]: [phone]    Social Determinants of Health (SDOH) Social History: [place] No Food Insecurity (12/16/2022)  Housing: Low Risk  (12/16/2022)  Transportation Needs: No Transportation Needs (12/16/2022)  Utilities: Not At Risk (12/16/2022)  Alcohol Screen: Low Risk  (04/19/2022)  Depression (PHQ2-9): Medium Risk (08/13/2022)  Financial Resource Strain: Medium Risk (04/19/2022)  Physical Activity: Inactive (04/19/2022)  Social Connections: Moderately Isolated (04/19/2022)  Stress: Stress Concern Present (04/19/2022)  Tobacco Use: Low Risk  (12/16/2022)   SDOH Interventions: Food Insecurity Interventions: Intervention Not Indicated Housing Interventions: Intervention Not Indicated Transportation Interventions: Intervention Not Indicated Utilities Interventions: Intervention Not  Indicated   Readmission Risk Interventions     No data to display

## 2022-12-17 NOTE — Progress Notes (Signed)
Increased confusion with patient climbing out of bed with siderails up. Removed defib pads and external urinary catheter. Attempting to remove bedside monitor despite numerous attempts to reorient him. Will discuss with MD.

## 2022-12-17 NOTE — Progress Notes (Signed)
PT Cancellation Note  Patient Details Name: Matthew Pratt MRN: [id number] DOB: 1951/11/08   Cancelled Treatment:    Reason Eval/Treat Not Completed: Patient not medically ready. Pt to have EP consult and order updated to start tomorrow. Will follow up tomorrow.   Shary Decamp [facility] 12/17/2022, 11:26 AM [place] Office [phone]

## 2022-12-17 NOTE — Progress Notes (Signed)
OT Cancellation Note  Patient Details Name: Matthew Pratt MRN: [id number] DOB: 06/21/1951   Cancelled Treatment:    Reason Eval/Treat Not Completed: Patient not medically ready (Pt to have EP consult.)  [name] 12/17/2022, 12:55 PM Cleta Alberts, OTR/L [place] Office: [phone]

## 2022-12-17 NOTE — Progress Notes (Signed)
Called E-link for ongoing confusion and restlessness/agitation as patient tried to get out of bed. Tried reorienting patient multiple times. Nurse suggested an increased dose of haldol or Dex, provider said pt's QTC was high, so patient was started on low dose Dex to help with restlessness/agitation.

## 2022-12-17 NOTE — Progress Notes (Signed)
NAME:  Matthew Pratt, MRN:  [id number], DOB:  1951/02/01, LOS: 2 ADMISSION DATE:  12/15/2022, CONSULTATION DATE:  12/15/21 REFERRING MD:  EDP, CHIEF COMPLAINT:  arrest   History of Present Illness:  72 year old man w/ hx of CAD, prior STEMI, HLD, GERD, OSA, hypothyroidism, GB issues presenting with witnessed OOH cardiac arrest.  +fall witnessed by neighbor.  Unclear initial rhtyhm but did get 3 AED shocks.  Total arrest time x 8-9 minutes.  Breathing on ROSC but not following commands.  Labs/imaging pending.  EKG no STEMI.  CCM to admit.  Pertinent  Medical History   Past Medical History:  Diagnosis Date   Anxiety    Arthritis    BPH (benign prostatic hypertrophy)    CAD (coronary artery disease)    s/p STEMI 10/15/10 w/ Promus DES placed x1 in the first OM   Colon polyps    Complication of anesthesia    pt. reported 2015 penile inplant when getting some anesthesia had chest pain,they readjusted  the meds. and continued with surgery. Had no further problems di not have to follow up with anyone.   Depression    Depression with anxiety    Dupuytren's contracture of hand    ED (erectile dysfunction)    GERD (gastroesophageal reflux disease)    Glaucoma    HLD (hyperlipidemia)    Hypocontractile bladder    Hypothyroidism    MI (myocardial infarction) ([place])    denies   OSA on CPAP    Peyronie's disease    Rosacea    Skin cancer    BCC   Significant Hospital Events: Including procedures, antibiotic start and stop dates in addition to other pertinent events   1/10 admit, king exchanged for ETT 1/11 Starting to wake up agitated, apnea on ventilation initially became consistently awake and successfully extubated.  Interim History / Subjective:  Currently calm.  Poor short-term memory. Had a prolonged period of ventricular standstill and complete heart block with dizziness while sitting up. Objective   Blood pressure (!) 154/76, pulse 66, temperature 99.1 F (37.3 C), resp.  rate 10, height '6\' 1"'$  (1.854 m), weight 90.2 kg, SpO2 100 %.    Set Rate:  [14 bmp] 14 bmp   Intake/Output Summary (Last 24 hours) at 12/17/2022 1335 Last data filed at 12/17/2022 1200 Gross per 24 hour  Intake 457.39 ml  Output 2490 ml  Net -2032.61 ml    Filed Weights   12/15/22 2200 12/16/22 0355 12/17/22 0500  Weight: 90.2 kg 90.2 kg 90.2 kg   Examination: General: elderly man in NAD HENT: Moist mucous membranes.  No scleral icterus Lungs: Chest clear to auscultation bilaterally. Cardiovascular: ext warm, heart sounds regular Abdomen: soft, hypoactive BS Extremities: no edema or cyanosis Neuro: Awake no focal deficits.  Knows his name but does not know the place or the date.. GU: foley in place.  Ancillary tests personally reviewed  No PE on CTA Creatinine improving to 0.85 Initial troponin negative rose to 910 Lactate has now cleared 1.8 the posterior wall is hypokinetic.  Normal RV function with normal estimated PA pressures.  No significant valvular abnormalities. Echocardiogram shows EF 55% with regional wall motion abnormalities. Assessment & Plan:   OHCA-initial shockable rhythm.  Etiology appears to be bradycardia arrhythmic likely conducting system disease. Possible NSTEMI Post arrest encephalopathy-has made progress and hopefully memory will improve CAD, prior STEMI, HLD, GERD, OSA, hypothyroidism, GB issues  -EPS evaluation for potential pacemaker versus pacemaker defibrillator. -PT OT  evaluation.  May need inpatient rehabilitation. -Will resume home medications.  Best Practice (right click and "Reselect all SmartList Selections" daily)   Diet/type: -regular diet DVT prophylaxis:heparin 3 times daily GI prophylaxis: Not applicable Lines: Arterial line removal ordered Foley: Foley catheter removal ordered Code Status:  full code Last date of multidisciplinary goals of care discussion [Sister has been updated by phone.]  Kipp Brood, MD [facility] ICU  Physician [place]  Pager: [phone] Mobile: [phone] After hours: [phone].

## 2022-12-18 DIAGNOSIS — I469 Cardiac arrest, cause unspecified: Secondary | ICD-10-CM | POA: Diagnosis not present

## 2022-12-18 LAB — CULTURE, RESPIRATORY W GRAM STAIN: Culture: NORMAL

## 2022-12-18 LAB — GLUCOSE, CAPILLARY
Glucose-Capillary: 122 mg/dL — ABNORMAL HIGH (ref 70–99)
Glucose-Capillary: 69 mg/dL — ABNORMAL LOW (ref 70–99)
Glucose-Capillary: 78 mg/dL (ref 70–99)
Glucose-Capillary: 86 mg/dL (ref 70–99)
Glucose-Capillary: 90 mg/dL (ref 70–99)
Glucose-Capillary: 96 mg/dL (ref 70–99)
Glucose-Capillary: 97 mg/dL (ref 70–99)
Glucose-Capillary: 98 mg/dL (ref 70–99)

## 2022-12-18 LAB — LIPID PANEL
Cholesterol: 170 mg/dL (ref 0–200)
HDL: 45 mg/dL (ref >40)
LDL Cholesterol: 100 mg/dL — ABNORMAL HIGH (ref 0–99)
Total CHOL/HDL Ratio: 3.8 RATIO
Triglycerides: 123 mg/dL (ref <150)
VLDL: 25 mg/dL (ref 0–40)

## 2022-12-18 LAB — HEMOGLOBIN A1C
Hgb A1c MFr Bld: 5.3 % (ref 4.8–5.6)
Mean Plasma Glucose: 105.41 mg/dL

## 2022-12-18 LAB — BASIC METABOLIC PANEL
Anion gap: 8 (ref 5–15)
BUN: 16 mg/dL (ref 8–23)
CO2: 28 mmol/L (ref 22–32)
Calcium: 8.3 mg/dL — ABNORMAL LOW (ref 8.9–10.3)
Chloride: 104 mmol/L (ref 98–111)
Creatinine, Ser: 0.97 mg/dL (ref 0.61–1.24)
GFR, Estimated: >60 mL/min (ref >60)
Glucose, Bld: 107 mg/dL — ABNORMAL HIGH (ref 70–99)
Potassium: 3.9 mmol/L (ref 3.5–5.1)
Sodium: 140 mmol/L (ref 135–145)

## 2022-12-18 LAB — CBC
HCT: 36.1 % — ABNORMAL LOW (ref 39.0–52.0)
Hemoglobin: 12.4 g/dL — ABNORMAL LOW (ref 13.0–17.0)
MCH: 35.5 pg — ABNORMAL HIGH (ref 26.0–34.0)
MCHC: 34.3 g/dL (ref 30.0–36.0)
MCV: 103.4 fL — ABNORMAL HIGH (ref 80.0–100.0)
Platelets: 102 10*3/uL — ABNORMAL LOW (ref 150–400)
RBC: 3.49 MIL/uL — ABNORMAL LOW (ref 4.22–5.81)
RDW: 12.4 % (ref 11.5–15.5)
WBC: 6.6 10*3/uL (ref 4.0–10.5)
nRBC: 0 % (ref 0.0–0.2)

## 2022-12-18 NOTE — Evaluation (Signed)
Occupational Therapy Evaluation Patient Details Name: Matthew Pratt MRN: [id number] DOB: 11-Nov-1951 Today's Date: 12/18/2022   History of Present Illness 72 year old man presenting with witnessed OOH cardiac arrest +fall witnessed by neighbor. Total arrest time 8-9 minutes with 3 AED shocks. PMHx of CAD, prior STEMI, HLD, GERD, OSA, hypothyroidism, GB issues   Clinical Impression   Matthew Pratt was evaluated s/p the above admission list, he is typically indep at baseline and lives alone. Upon evaluation pt demonstrated functional limitations due to impaired cognition, unsteady gait, impulsivity and decreased activity tolerance. Due to the deficits listed, he requires up to min A for ADLs. OT to continue to follow acutely. Recommend d/c to AIR for maximal functional progress towards pt's indep baseline.      Recommendations for follow up therapy are one component of a multi-disciplinary discharge planning process, led by the attending physician.  Recommendations may be updated based on patient status, additional functional criteria and insurance authorization.   Follow Up Recommendations  Acute inpatient rehab (3hours/day)     Assistance Recommended at Discharge Frequent or constant Supervision/Assistance  Patient can return home with the following A little help with walking and/or transfers;A little help with bathing/dressing/bathroom;Assistance with cooking/housework;Assistance with feeding;Direct supervision/assist for medications management;Direct supervision/assist for financial management;Assist for transportation;Help with stairs or ramp for entrance    Functional Status Assessment  Patient has had a recent decline in their functional status and demonstrates the ability to make significant improvements in function in a reasonable and predictable amount of time.  Equipment Recommendations  Tub/shower seat    Recommendations for Other Services Rehab consult     Precautions /  Restrictions Precautions Precautions: Fall Restrictions Weight Bearing Restrictions: No      Mobility Bed Mobility Overal bed mobility: Needs Assistance             General bed mobility comments: sitting OEB upon arrival    Transfers Overall transfer level: Needs assistance Equipment used: None Transfers: Sit to/from Stand Sit to Stand: Min guard                  Balance Overall balance assessment: Needs assistance Sitting-balance support: Feet supported Sitting balance-Leahy Scale: Good     Standing balance support: No upper extremity supported, During functional activity Standing balance-Leahy Scale: Fair Standing balance comment: for static standing                         ADL either performed or assessed with clinical judgement   ADL Overall ADL's : Needs assistance/impaired Eating/Feeding: Set up;Sitting   Grooming: Min guard;Standing Grooming Details (indicate cue type and reason): at the sink Upper Body Bathing: Min guard;Sitting   Lower Body Bathing: Minimal assistance;Sit to/from stand   Upper Body Dressing : Min guard;Sitting   Lower Body Dressing: Minimal assistance;Sit to/from stand   Toilet Transfer: Minimal assistance;Ambulation   Toileting- Clothing Manipulation and Hygiene: Min guard;Sitting/lateral lean       Functional mobility during ADLs: Minimal assistance General ADL Comments: several LOBs, impulsive, requires cues for cognition     Vision Baseline Vision/History: 1 Wears glasses Vision Assessment?: No apparent visual deficits Additional Comments: seemed WFL for tasks assessed - did not formally assess     Perception Perception Perception Tested?: No   Praxis Praxis Praxis tested?: Not tested    Pertinent Vitals/Pain Pain Assessment Pain Assessment: Faces Faces Pain Scale: No hurt Pain Intervention(s): Monitored during session     Hand Dominance  Right   Extremity/Trunk Assessment Upper Extremity  Assessment Upper Extremity Assessment: Generalized weakness   Lower Extremity Assessment Lower Extremity Assessment: Defer to PT evaluation   Cervical / Trunk Assessment Cervical / Trunk Assessment: Normal   Communication Communication Communication: No difficulties   Cognition Arousal/Alertness: Awake/alert Behavior During Therapy: WFL for tasks assessed/performed Overall Cognitive Status: Impaired/Different from baseline Area of Impairment: Orientation, Attention, Memory, Following commands, Safety/judgement, Awareness, Problem solving                 Orientation Level: Disoriented to, Time Current Attention Level: Sustained Memory: Decreased recall of precautions, Decreased short-term memory Following Commands: Follows one step commands consistently Safety/Judgement: Decreased awareness of safety, Decreased awareness of deficits Awareness: Emergent Problem Solving: Slow processing, Decreased initiation, Difficulty sequencing, Requires verbal cues General Comments: Unable to recall year/month despite frequent re-orientation. impulsive with little insight and problem solving. very poor attention, easily distracted by environmental stimuli. requires simple one step directions     General Comments  VSS on RA, left O2 off at the end of the session     [place] expects to be discharged to:: Private residence Living Arrangements: Alone Available Help at Discharge: Family;Available 24 hours/day (sister) Type of Home: House Home Access: Stairs to enter [employer] of Steps: 3 Entrance Stairs-Rails: Can reach both Home Layout: Two level;Able to live on main level with bedroom/bathroom               Home Equipment: None   Additional Comments: potentially can d/c to sisters house with 24/7 assist      Prior Functioning/Environment Prior Level of Function : Independent/Modified Independent;Driving             Mobility Comments: no  AD ADLs Comments: indep        OT Problem List: Decreased activity tolerance;Impaired balance (sitting and/or standing);Decreased cognition;Decreased safety awareness;Decreased knowledge of precautions      OT Treatment/Interventions: Self-care/ADL training;Therapeutic exercise;DME and/or AE instruction;Cognitive remediation/compensation;Balance training    OT Goals(Current goals can be found in the care plan section) Acute Rehab OT Goals Patient Stated Goal: to be better OT Goal Formulation: With patient Time For Goal Achievement: 01/01/23 Potential to Achieve Goals: Good ADL Goals Pt Will Perform Grooming: Independently;standing Pt Will Perform Lower Body Dressing: Independently;sit to/from stand Pt Will Transfer to Toilet: Independently;ambulating Additional ADL Goal #1: pt will indep complete IADL medicaiton management task Additional ADL Goal #2: Pt will complete a 3-step wayfinding task with generalized supervision A  OT Frequency: Min 2X/week    Co-evaluation PT/OT/SLP Co-Evaluation/Treatment: Yes Reason for Co-Treatment: Complexity of the patient's impairments (multi-system involvement);For patient/therapist safety;To address functional/ADL transfers   OT goals addressed during session: ADL's and self-care      AM-PAC OT "6 Clicks" Daily Activity     Outcome Measure Help from another person eating meals?: A Little Help from another person taking care of personal grooming?: A Little Help from another person toileting, which includes using toliet, bedpan, or urinal?: A Little Help from another person bathing (including washing, rinsing, drying)?: A Little Help from another person to put on and taking off regular upper body clothing?: A Little Help from another person to put on and taking off regular lower body clothing?: A Little 6 Click Score: 18   End of Session Equipment Utilized During Treatment: Gait belt Nurse Communication: Mobility status  Activity Tolerance:  Patient tolerated treatment well Patient left: in chair;with call bell/phone within reach;with bed alarm set;with family/visitor present  OT Visit Diagnosis: Other abnormalities of gait and mobility (R26.89);Unsteadiness on feet (R26.81);Muscle weakness (generalized) (M62.81)                Time: 8307-3543 OT Time Calculation (min): 23 min Charges:  OT General Charges $OT Visit: 1 Visit OT Evaluation $OT Eval Moderate Complexity: 1 Mod   Matthew Pratt 12/18/2022, 2:18 PM

## 2022-12-18 NOTE — Progress Notes (Signed)
Cardiologist:  Matthew Pratt   Subjective:   Cheerful No chest pain Memory poor Discussed need for device hopefully Monday Dr Quentin Ore seen today    Objective:  Vitals:   12/18/22 0500 12/18/22 0600 12/18/22 0700 12/18/22 0800  BP: 106/68 116/69    Pulse: 60 72 79 71  Resp: '13 12 18 13  '$ Temp:      TempSrc:      SpO2: 98% 97% (!) 75% 99%  Weight:      Height:        Intake/Output from previous day:  Intake/Output Summary (Last 24 hours) at 12/18/2022 1007 Last data filed at 12/18/2022 0838 Gross per 24 hour  Intake 614.52 ml  Output 1255 ml  Net -640.48 ml    Physical Exam: Extubated NG out  Central line left subclavian Abdomen benign Trace edema Moving all extremities Disoriented to time/place  Lab Results: Basic Metabolic Panel: Recent Labs    12/16/22 0359 12/16/22 1605 12/17/22 0504 12/18/22 0550  NA 135   < > 138 140  K 3.8   < > 3.9 3.9  CL 103  --  106 104  CO2 15*  --  25 28  GLUCOSE 235*  --  113* 107*  BUN 22  --  15 16  CREATININE 1.34*  --  0.85 0.97  CALCIUM 8.8*  --  8.5* 8.3*  MG 2.0  --  1.9  --   PHOS 2.3*  --  2.3*  --    < > = values in this interval not displayed.   Liver Function Tests: Recent Labs    12/16/22 0359 12/17/22 0504  AST 135* 76*  ALT 177* 107*  ALKPHOS 74 59  BILITOT 1.0 1.4*  PROT 6.6 5.9*  ALBUMIN 3.7 3.3*   No results for input(s): "LIPASE", "AMYLASE" in the last 72 hours. CBC: Recent Labs    12/17/22 0504 12/18/22 0424  WBC 11.3* 6.6  HGB 12.7* 12.4*  HCT 37.3* 36.1*  MCV 101.9* 103.4*  PLT 100* 102*    Fasting Lipid Panel: Recent Labs    12/18/22 0424  CHOL 170  HDL 45  LDLCALC 100*  TRIG 123  CHOLHDL 3.8     Imaging: EEG adult  Result Date: 12/16/2022 Lora Havens, MD     12/16/2022  2:00 PM Patient Name: Matthew Pratt MRN: [id number] Epilepsy Attending: Lora Havens Referring Physician/Provider: Candee Furbish, MD Date: 12/16/2022 Duration: 22.20 mins Patient history:  72 year old male status post cardiac arrest.  EEG to evaluate for seizure. Level of alertness: comatose AEDs during EEG study: Versed, propofol Technical aspects: This EEG study was done with scalp electrodes positioned according to the 10-20 International system of electrode placement. Electrical activity was reviewed with band pass filter of 1-'70Hz'$ , sensitivity of 7 uV/mm, display speed of 67m/sec with a '60Hz'$  notched filter applied as appropriate. EEG data were recorded continuously and digitally stored.  Video monitoring was available and reviewed as appropriate. Description: EEG showed continuous generalized predominantly 5 to 8 Hz theta slowing admixed with intermittent generalized 2 to 3 Hz delta slowing. Hyperventilation and photic stimulation were not performed.   ABNORMALITY - Continuous slow, generalized IMPRESSION: This study is suggestive of severe diffuse encephalopathy, nonspecific etiology. No seizures or epileptiform discharges were seen throughout the recording. Priyanka OBarbra Sarks   Cardiac Studies:  ECG: SR no acute changes normal QT PAC/PVC;s    Telemetry: SR    Echo: EF 55% small posterior wall motion ?  Old   Medications:    aspirin EC  81 mg Oral Daily   Chlorhexidine Gluconate Cloth  6 each Topical Q0600   ezetimibe  10 mg Oral Daily   heparin  5,000 Units Subcutaneous Q8H   insulin aspart  0-20 Units Subcutaneous Q4H   levothyroxine  125 mcg Oral Daily   QUEtiapine  25 mg Oral BID   sodium chloride flush  3 mL Intravenous Q12H   sodium chloride flush  3 mL Intravenous Q12H   venlafaxine XR  112.5 mg Oral Daily      dexmedetomidine (PRECEDEX) IV infusion Stopped (12/17/22 2332)    Assessment/Plan:   Cardiac Arrest:  Shocked x 3 in field Cath with collateralized OM Elevated troponin 900 range not likely primary event as OM appeared chronically occluded with collaterals. EF 55% normal QT no acute ECG changes CTA negative dissection and no proximal PE. EP has seen  hopefully will get pacer defibrillator on Monday  Note K was 2.9 on admission may have contributed to arrhythmia  Anoxic Brain Injury:  EEG with diffuse slowing CT negative per CCM/neurology  CAD:  stable on cath this admission add ASA/Zetia back   Jenkins Rouge 12/18/2022, 10:07 AM

## 2022-12-18 NOTE — Progress Notes (Signed)
EP Rounding Note    Patient Name: Cledis Sohn Teng Date of Encounter: 12/18/2022  [place] Cardiologist: Elouise Munroe, MD   Subjective   NAEO. Awake in chair this AM.  Inpatient Medications    Scheduled Meds:  aspirin EC  81 mg Oral Daily   Chlorhexidine Gluconate Cloth  6 each Topical Q0600   ezetimibe  10 mg Oral Daily   heparin  5,000 Units Subcutaneous Q8H   insulin aspart  0-20 Units Subcutaneous Q4H   levothyroxine  125 mcg Oral Daily   QUEtiapine  25 mg Oral BID   sodium chloride flush  3 mL Intravenous Q12H   sodium chloride flush  3 mL Intravenous Q12H   venlafaxine XR  112.5 mg Oral Daily   Continuous Infusions:  dexmedetomidine (PRECEDEX) IV infusion Stopped (12/17/22 2332)   PRN Meds: haloperidol lactate, nitroGLYCERIN, ondansetron (ZOFRAN) IV, sodium chloride flush, traZODone   Vital Signs    Vitals:   12/18/22 0430 12/18/22 0450 12/18/22 0500 12/18/22 0600  BP: 110/67  106/68 116/69  Pulse: 64  60 72  Resp: '12  13 12  '$ Temp:  98.1 F (36.7 C)    TempSrc:  Oral    SpO2: 99%  98% 97%  Weight:      Height:        Intake/Output Summary (Last 24 hours) at 12/18/2022 0824 Last data filed at 12/18/2022 0000 Gross per 24 hour  Intake 374.52 ml  Output 1485 ml  Net -1110.48 ml      12/17/2022    5:00 AM 12/16/2022    3:55 AM 12/15/2022   10:00 PM  Last 3 Weights  Weight (lbs) 198 lb 13.7 oz 198 lb 13.7 oz 198 lb 13.7 oz  Weight (kg) 90.2 kg 90.2 kg 90.2 kg      Telemetry    Personally Reviewed  ECG    Personally Reviewed  Physical Exam   GEN: No acute distress.   Neck: No JVD Cardiac: RRR, no murmurs, rubs, or gallops.  Respiratory: Clear to auscultation bilaterally. GI: Soft, nontender, non-distended  MS: No edema; No deformity. Neuro:  Nonfocal. Oriented to self and place. Psych: Normal affect   Labs    High Sensitivity Troponin:   Recent Labs  Lab 12/15/22 1310 12/15/22 2115 12/16/22 0251  12/16/22 0359  TROPONINIHS 60* 910* 594* 518*     Chemistry Recent Labs  Lab 12/15/22 1310 12/15/22 1317 12/15/22 2350 12/16/22 0359 12/16/22 1605 12/17/22 0504 12/18/22 0550  NA 134*   < > 137 135 138 138 140  K 4.3   < > 3.2* 3.8 4.6 3.9 3.9  CL 100   < > 102 103  --  106 104  CO2 19*  --  11* 15*  --  25 28  GLUCOSE 107*   < > 265* 235*  --  113* 107*  BUN 18   < > 22 22  --  15 16  CREATININE 1.55*   < > 1.56* 1.34*  --  0.85 0.97  CALCIUM 8.8*  --  8.7* 8.8*  --  8.5* 8.3*  MG 2.0  --  2.0 2.0  --  1.9  --   PROT 6.7  --   --  6.6  --  5.9*  --   ALBUMIN 3.8  --   --  3.7  --  3.3*  --   AST 83*  --   --  135*  --  76*  --  ALT 82*  --   --  177*  --  107*  --   ALKPHOS 80  --   --  74  --  59  --   BILITOT 1.3*  --   --  1.0  --  1.4*  --   GFRNONAA 47*   < > 47* 56*  --  >60 >60  ANIONGAP 15  --  24* 17*  --  7 8   < > = values in this interval not displayed.    Lipids  Recent Labs  Lab 12/18/22 0424  CHOL 170  TRIG 123  HDL 45  LDLCALC 100*  CHOLHDL 3.8    Hematology Recent Labs  Lab 12/16/22 0359 12/16/22 1605 12/17/22 0504 12/18/22 0424  WBC 8.2  --  11.3* 6.6  RBC 4.12*  --  3.66* 3.49*  HGB 14.3 12.9* 12.7* 12.4*  HCT 41.1 38.0* 37.3* 36.1*  MCV 99.8  --  101.9* 103.4*  MCH 34.7*  --  34.7* 35.5*  MCHC 34.8  --  34.0 34.3  RDW 12.0  --  12.3 12.4  PLT 167  --  100* 102*   Thyroid No results for input(s): "TSH", "FREET4" in the last 168 hours.  BNPNo results for input(s): "BNP", "PROBNP" in the last 168 hours.  DDimer No results for input(s): "DDIMER" in the last 168 hours.   Radiology    EEG adult  Result Date: 12/16/2022 Lora Havens, MD     12/16/2022  2:00 PM Patient Name: Bingham Millette Gelpi MRN: [id number] Epilepsy Attending: Lora Havens Referring Physician/Provider: Candee Furbish, MD Date: 12/16/2022 Duration: 22.20 mins Patient history: 72 year old male status post cardiac arrest.  EEG to evaluate for seizure. Level of  alertness: comatose AEDs during EEG study: Versed, propofol Technical aspects: This EEG study was done with scalp electrodes positioned according to the 10-20 International system of electrode placement. Electrical activity was reviewed with band pass filter of 1-'70Hz'$ , sensitivity of 7 uV/mm, display speed of 27m/sec with a '60Hz'$  notched filter applied as appropriate. EEG data were recorded continuously and digitally stored.  Video monitoring was available and reviewed as appropriate. Description: EEG showed continuous generalized predominantly 5 to 8 Hz theta slowing admixed with intermittent generalized 2 to 3 Hz delta slowing. Hyperventilation and photic stimulation were not performed.   ABNORMALITY - Continuous slow, generalized IMPRESSION: This study is suggestive of severe diffuse encephalopathy, nonspecific etiology. No seizures or epileptiform discharges were seen throughout the recording. [place]   #Cardiac arrest Preserved EF Mental status improving.  #CHB Paroxysmal. ? Vagally mediated. Plan for DDD ICD this week.    For questions or updates, please contact CLake SherwoodPlease consult [email or web address] for contact info under        Signed, CVickie Epley MD  12/18/2022, 8:24 AM

## 2022-12-18 NOTE — Evaluation (Signed)
Physical Therapy Evaluation Patient Details Name: Matthew Pratt MRN: [id number] DOB: Apr 10, 1951 Today's Date: 12/18/2022  History of Present Illness  72 year old man presenting with witnessed OOH cardiac arrest +fall witnessed by neighbor. Total arrest time 8-9 minutes with 3 AED shocks. PMHx of CAD, prior STEMI, HLD, GERD, OSA, hypothyroidism, GB issues  Clinical Impression  PTA, pt lives alone and is independent. Pt family reports option for pt to potentially stay with sister in [place], who can provide 24/7 assist. Pt presents with impaired dynamic balance, cognition and activity tolerance. Pt requiring min assist for functional mobility. Recommend AIR to address deficits and maximize functional independence.     Recommendations for follow up therapy are one component of a multi-disciplinary discharge planning process, led by the attending physician.  Recommendations may be updated based on patient status, additional functional criteria and insurance authorization.  Follow Up Recommendations Acute inpatient rehab (3hours/day)      Assistance Recommended at Discharge Frequent or constant Supervision/Assistance  Patient can return home with the following  A little help with walking and/or transfers;A little help with bathing/dressing/bathroom;Assistance with cooking/housework;Direct supervision/assist for medications management;Assist for transportation;Direct supervision/assist for financial management;Help with stairs or ramp for entrance    Equipment Recommendations None recommended by PT  Recommendations for Other Services  Rehab consult    Functional Status Assessment Patient has had a recent decline in their functional status and demonstrates the ability to make significant improvements in function in a reasonable and predictable amount of time.     Precautions / Restrictions Precautions Precautions: Fall Restrictions Weight Bearing Restrictions: No      Mobility   Bed Mobility Overal bed mobility: Needs Assistance             General bed mobility comments: sitting EOB upon arrival    Transfers Overall transfer level: Needs assistance Equipment used: None Transfers: Sit to/from Stand Sit to Stand: Min guard                Ambulation/Gait Ambulation/Gait assistance: Min assist Gait Distance (Feet): 120 Feet Assistive device: None Gait Pattern/deviations: Step-through pattern, Decreased stride length, Scissoring       General Gait Details: Intermittent scissoring and 1-2 episodes of posterior LOB requiring minA for correction. decreased awareness  Stairs            Wheelchair Mobility    Modified Rankin (Stroke Patients Only)       Balance Overall balance assessment: Needs assistance Sitting-balance support: Feet supported Sitting balance-Leahy Scale: Good     Standing balance support: No upper extremity supported, During functional activity Standing balance-Leahy Scale: Fair Standing balance comment: for static standing                             Pertinent Vitals/Pain Pain Assessment Pain Assessment: Faces Faces Pain Scale: No hurt    Home Living Family/patient expects to be discharged to:: Private residence Living Arrangements: Alone Available Help at Discharge: Family;Available 24 hours/day (sister) Type of Home: House Home Access: Stairs to enter Entrance Stairs-Rails: Can reach both Entrance Stairs-Number of Steps: 3   Home Layout: Two level;Able to live on main level with bedroom/bathroom Home Equipment: None Additional Comments: potentially can d/c to sisters house with 24/7 assist    Prior Function Prior Level of Function : Independent/Modified Independent;Driving             Mobility Comments: no AD ADLs Comments: indep  Hand Dominance   Dominant Hand: Right    Extremity/Trunk Assessment   Upper Extremity Assessment Upper Extremity Assessment: Defer to OT  evaluation    Lower Extremity Assessment Lower Extremity Assessment: Overall WFL for tasks assessed    Cervical / Trunk Assessment Cervical / Trunk Assessment: Normal  Communication   Communication: No difficulties  Cognition Arousal/Alertness: Awake/alert Behavior During Therapy: WFL for tasks assessed/performed Overall Cognitive Status: Impaired/Different from baseline Area of Impairment: Orientation, Attention, Memory, Following commands, Safety/judgement, Awareness, Problem solving                 Orientation Level: Disoriented to, Time Current Attention Level: Sustained Memory: Decreased recall of precautions, Decreased short-term memory Following Commands: Follows one step commands consistently Safety/Judgement: Decreased awareness of safety, Decreased awareness of deficits Awareness: Emergent Problem Solving: Slow processing, Decreased initiation, Difficulty sequencing, Requires verbal cues General Comments: Unable to recall year/month despite frequent re-orientation. impulsive with little insight and problem solving. very poor attention, easily distracted by environmental stimuli. requires simple one step directions        General Comments General comments (skin integrity, edema, etc.): VSS on RA, left O2 off at the end of the session    Exercises     Assessment/Plan    PT Assessment Patient needs continued PT services  PT Problem List Decreased balance;Decreased mobility;Decreased cognition;Decreased safety awareness       PT Treatment Interventions Gait training;Stair training;Functional mobility training;Therapeutic activities;Balance training;Therapeutic exercise;Patient/family education    PT Goals (Current goals can be found in the Care Plan section)  Acute Rehab PT Goals Patient Stated Goal: did not state PT Goal Formulation: With patient Time For Goal Achievement: 01/01/23 Potential to Achieve Goals: Good    Frequency Min 3X/week      Co-evaluation PT/OT/SLP Co-Evaluation/Treatment: Yes Reason for Co-Treatment: For patient/therapist safety;To address functional/ADL transfers PT goals addressed during session: Mobility/safety with mobility OT goals addressed during session: ADL's and self-care       AM-PAC PT "6 Clicks" Mobility  Outcome Measure Help needed turning from your back to your side while in a flat bed without using bedrails?: None Help needed moving from lying on your back to sitting on the side of a flat bed without using bedrails?: A Little Help needed moving to and from a bed to a chair (including a wheelchair)?: A Little Help needed standing up from a chair using your arms (e.g., wheelchair or bedside chair)?: A Little Help needed to walk in hospital room?: A Little Help needed climbing 3-5 steps with a railing? : A Little 6 Click Score: 19    End of Session Equipment Utilized During Treatment: Gait belt Activity Tolerance: Patient tolerated treatment well Patient left: in chair;with call bell/phone within reach;with chair alarm set Nurse Communication: Mobility status PT Visit Diagnosis: Unsteadiness on feet (R26.81)    Time: 5456-2563 PT Time Calculation (min) (ACUTE ONLY): 24 min   Charges:   PT Evaluation $PT Eval Low Complexity: 1 Low          Wyona Almas, PT, DPT Acute Rehabilitation Services Office [phone]   Deno Etienne 12/18/2022, 3:04 PM

## 2022-12-18 NOTE — Progress Notes (Addendum)
NAME:  Matthew Pratt, MRN:  [id number], DOB:  [birth year], LOS: 3 ADMISSION DATE:  12/15/2022, CONSULTATION DATE:  12/15/21 REFERRING MD:  EDP, CHIEF COMPLAINT:  arrest   History of Present Illness:  72 year old man w/ hx of CAD, prior STEMI, HLD, GERD, OSA, hypothyroidism, GB issues presenting with witnessed OOH cardiac arrest.  +fall witnessed by neighbor.  Unclear initial rhtyhm but did get 3 AED shocks.  Total arrest time x 8-9 minutes.  Breathing on ROSC but not following commands.  Labs/imaging pending.  EKG no STEMI.  CCM to admit.  Pertinent  Medical History   Past Medical History:  Diagnosis Date   Anxiety    Arthritis    BPH (benign prostatic hypertrophy)    CAD (coronary artery disease)    s/p STEMI 10/15/10 w/ Promus DES placed x1 in the first OM   Colon polyps    Complication of anesthesia    pt. reported 2015 penile inplant when getting some anesthesia had chest pain,they readjusted  the meds. and continued with surgery. Had no further problems di not have to follow up with anyone.   Depression    Depression with anxiety    Dupuytren's contracture of hand    ED (erectile dysfunction)    GERD (gastroesophageal reflux disease)    Glaucoma    HLD (hyperlipidemia)    Hypocontractile bladder    Hypothyroidism    MI (myocardial infarction) ([place])    denies   OSA on CPAP    Peyronie's disease    Rosacea    Skin cancer    BCC   Significant Hospital Events: Including procedures, antibiotic start and stop dates in addition to other pertinent events   1/10 admit, king exchanged for ETT 1/11 Starting to wake up agitated, apnea on ventilation initially became consistently awake and successfully extubated. 1/12 Had a prolonged period of ventricular standstill and complete heart block with dizziness while sitting up.  Evaluated by EPS for pacemaker/defibrillator  Interim History / Subjective:  Currently calm.  Poor short-term memory persists but is improving.  Seems to  recall general events but is unclear on details. Did require Precedex overnight for agitation.  Objective   Blood pressure 116/69, pulse 71, temperature 97.7 F (36.5 C), temperature source Oral, resp. rate 13, height '6\' 1"'$  (1.854 m), weight 90.2 kg, SpO2 99 %.        Intake/Output Summary (Last 24 hours) at 12/18/2022 1216 Last data filed at 12/18/2022 1149 Gross per 24 hour  Intake 1010.18 ml  Output 1080 ml  Net -69.82 ml    Filed Weights   12/15/22 2200 12/16/22 0355 12/17/22 0500  Weight: 90.2 kg 90.2 kg 90.2 kg   Examination: General: elderly man in NAD HENT: Moist mucous membranes.  No scleral icterus Lungs: Chest clear to auscultation bilaterally. Cardiovascular: ext warm, heart sounds regular Abdomen: soft, hypoactive BS Extremities: no edema or cyanosis Neuro: Awake no focal deficits.  Knows his name and knows that he is in hospital but is unclear as to why he is here.  Easily redirectable. GU: foley in place.  Ancillary tests personally reviewed  No PE on CTA Creatinine improving to 0.85 Initial troponin negative rose to 910 Lactate has now cleared 1.8 the posterior wall is hypokinetic.  Normal RV function with normal estimated PA pressures.  No significant valvular abnormalities. Echocardiogram shows EF 55% with regional wall motion abnormalities. Assessment & Plan:   OHCA-initial shockable rhythm.  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 Possible NSTEMI Post arrest encephalopathy-has made progress and hopefully memory will improve CAD, prior STEMI, HLD, GERD, OSA, hypothyroidism, GB issues  -EPS evaluation for potential pacemaker versus pacemaker defibrillator.  Plan for DDD ICD this week.  Given how long his pauses were, we will keep him in ICU until  after insertion. -PT OT evaluation.  May need inpatient rehabilitation. -Have resumed home medications. -Progressive ambulation.  Advance diet.  Best Practice (right  click and "Reselect all SmartList Selections" daily)   Diet/type: -regular diet DVT prophylaxis: heparin 3 times daily GI prophylaxis: Not applicable Lines: Arterial line removal ordered Foley: Foley catheter removal ordered Code Status:  full code Last date of multidisciplinary goals of care discussion [Sister has been updated by phone.]  Kipp Brood, MD [facility] ICU Physician [place]  Pager: [phone] Mobile: [phone] After hours: [phone].

## 2022-12-19 DIAGNOSIS — I469 Cardiac arrest, cause unspecified: Secondary | ICD-10-CM | POA: Diagnosis not present

## 2022-12-19 LAB — BASIC METABOLIC PANEL
Anion gap: 9 (ref 5–15)
BUN: 12 mg/dL (ref 8–23)
CO2: 25 mmol/L (ref 22–32)
Calcium: 8.6 mg/dL — ABNORMAL LOW (ref 8.9–10.3)
Chloride: 103 mmol/L (ref 98–111)
Creatinine, Ser: 0.86 mg/dL (ref 0.61–1.24)
GFR, Estimated: >60 mL/min (ref >60)
Glucose, Bld: 110 mg/dL — ABNORMAL HIGH (ref 70–99)
Potassium: 4 mmol/L (ref 3.5–5.1)
Sodium: 137 mmol/L (ref 135–145)

## 2022-12-19 LAB — GLUCOSE, CAPILLARY
Glucose-Capillary: 117 mg/dL — ABNORMAL HIGH (ref 70–99)
Glucose-Capillary: 113 mg/dL — ABNORMAL HIGH (ref 70–99)
Glucose-Capillary: 111 mg/dL — ABNORMAL HIGH (ref 70–99)

## 2022-12-19 LAB — CBC
HCT: 39.1 % (ref 39.0–52.0)
Hemoglobin: 13.6 g/dL (ref 13.0–17.0)
MCH: 35.2 pg — ABNORMAL HIGH (ref 26.0–34.0)
MCHC: 34.8 g/dL (ref 30.0–36.0)
MCV: 101.3 fL — ABNORMAL HIGH (ref 80.0–100.0)
Platelets: 112 10*3/uL — ABNORMAL LOW (ref 150–400)
RBC: 3.86 MIL/uL — ABNORMAL LOW (ref 4.22–5.81)
RDW: 12.1 % (ref 11.5–15.5)
WBC: 5.6 10*3/uL (ref 4.0–10.5)
nRBC: 0 % (ref 0.0–0.2)

## 2022-12-19 LAB — SURGICAL PCR SCREEN
MRSA, PCR: NEGATIVE
Staphylococcus aureus: NEGATIVE

## 2022-12-19 MED ORDER — IBUPROFEN 400 MG PO TABS: 400.0000 mg | ORAL_TABLET | Freq: Four times a day (QID) | ORAL | Status: DC | PRN

## 2022-12-19 MED ORDER — ACETAMINOPHEN 325 MG PO TABS
650.0000 mg | ORAL_TABLET | Freq: Four times a day (QID) | ORAL | Status: DC | PRN
  Administered 2022-12-19 – 2022-12-22 (×5): 650 mg via ORAL
  Filled 2022-12-19 (×4): qty 2

## 2022-12-19 MED ORDER — SODIUM CHLORIDE 0.9 % IV SOLN
80.0000 mg | INTRAVENOUS | Status: AC
  Filled 2022-12-19: qty 2

## 2022-12-19 MED ORDER — SODIUM CHLORIDE 0.9 % IV SOLN: INTRAVENOUS | Status: DC

## 2022-12-19 MED ORDER — QUETIAPINE FUMARATE 25 MG PO TABS
25.0000 mg | ORAL_TABLET | Freq: Every day | ORAL | Status: DC
  Administered 2022-12-19 – 2022-12-21 (×3): 25 mg via ORAL
  Filled 2022-12-19 (×3): qty 1

## 2022-12-19 MED ORDER — LIDOCAINE 5 % EX PTCH
1.0000 | MEDICATED_PATCH | CUTANEOUS | Status: DC
  Administered 2022-12-19 – 2022-12-21 (×3): 1 via TRANSDERMAL
  Filled 2022-12-19 (×3): qty 1

## 2022-12-19 MED ORDER — CEFAZOLIN SODIUM-DEXTROSE 2-4 GM/100ML-% IV SOLN: 2.0000 g | INTRAVENOUS | Status: AC

## 2022-12-19 MED ORDER — ACETAMINOPHEN 325 MG PO TABS
ORAL_TABLET | ORAL | Status: AC
  Filled 2022-12-19: qty 2

## 2022-12-19 MED ORDER — PANTOPRAZOLE SODIUM 40 MG PO TBEC
40.0000 mg | DELAYED_RELEASE_TABLET | Freq: Every day | ORAL | Status: DC
  Administered 2022-12-19 – 2022-12-22 (×4): 40 mg via ORAL
  Filled 2022-12-19 (×4): qty 1

## 2022-12-19 NOTE — Progress Notes (Addendum)
NAME:  Matthew Pratt, MRN:  [id number], DOB:  1951/06/04, LOS: 4 ADMISSION DATE:  12/15/2022, CONSULTATION DATE:  12/15/21 REFERRING MD:  EDP, CHIEF COMPLAINT:  arrest   History of Present Illness:  72 year old man w/ hx of CAD, prior STEMI, HLD, GERD, OSA, hypothyroidism, GB issues presenting with witnessed OOH cardiac arrest.  +fall witnessed by neighbor.  Unclear initial rhtyhm but did get 3 AED shocks.  Total arrest time x 8-9 minutes.  Breathing on ROSC but not following commands.  Labs/imaging pending.  EKG no STEMI.  CCM to admit.  Pertinent  Medical History   Past Medical History:  Diagnosis Date   Anxiety    Arthritis    BPH (benign prostatic hypertrophy)    CAD (coronary artery disease)    s/p STEMI 10/15/10 w/ Promus DES placed x1 in the first OM   Colon polyps    Complication of anesthesia    pt. reported 2015 penile inplant when getting some anesthesia had chest pain,they readjusted  the meds. and continued with surgery. Had no further problems di not have to follow up with anyone.   Depression    Depression with anxiety    Dupuytren's contracture of hand    ED (erectile dysfunction)    GERD (gastroesophageal reflux disease)    Glaucoma    HLD (hyperlipidemia)    Hypocontractile bladder    Hypothyroidism    MI (myocardial infarction) ([place])    denies   OSA on CPAP    Peyronie's disease    Rosacea    Skin cancer    BCC   Significant Hospital Events: Including procedures, antibiotic start and stop dates in addition to other pertinent events   1/10 admit, king exchanged for ETT 1/11 Starting to wake up agitated, apnea on ventilation initially became consistently awake and successfully extubated. 1/12 Had a prolonged period of ventricular standstill and complete heart block with dizziness while sitting up.  Evaluated by EPS for pacemaker/defibrillator  Interim History / Subjective:  Currently calm.  Poor short-term memory persists but is improving.  Seems to  recall general events but is unclear on details. Complains of chest pain from CPR  Objective   Blood pressure 116/79, pulse 61, temperature 98.6 F (37 C), temperature source Oral, resp. rate 12, height '6\' 1"'$  (1.854 m), weight 90.2 kg, SpO2 97 %.        Intake/Output Summary (Last 24 hours) at 12/19/2022 0922 Last data filed at 12/19/2022 0900 Gross per 24 hour  Intake 1200 ml  Output 2250 ml  Net -1050 ml    Filed Weights   12/15/22 2200 12/16/22 0355 12/17/22 0500  Weight: 90.2 kg 90.2 kg 90.2 kg   Examination: General: elderly man in NAD HENT: Moist mucous membranes.  No scleral icterus Lungs: Chest clear to auscultation bilaterally. Chest mild xiphisternal tenderness. No deformity.  Cardiovascular: ext warm, heart sounds regular Abdomen: soft, hypoactive BS Extremities: no edema or cyanosis Neuro: Awake no focal deficits.  Knows his name and knows that he is in hospital and can relay details of what he has been told.  GU: foley out  Ancillary tests personally reviewed  No PE on CTA Creatinine improving to 0.85 Initial troponin negative rose to 910 Lactate has now cleared 1.8 the posterior wall is hypokinetic.  Normal RV function with normal estimated PA pressures.  No significant valvular abnormalities. Echocardiogram shows EF 55% with regional wall motion abnormalities. Assessment & Plan:   OHCA-initial shockable rhythm.  Etiology  appears to be bradycardia arrhythmic likely conducting system disease. Possible NSTEMI Post arrest encephalopathy-has made progress and hopefully memory will improve CAD, prior STEMI, HLD, GERD, OSA, hypothyroidism, GB issues  -EPS evaluation for potential pacemaker versus pacemaker defibrillator.  Plan for DDD ICD this week.  Given how long his pauses were, we will keep him in ICU until  after insertion. - For pacemaker insertion 1/15 -PT OT evaluation.  May need inpatient rehabilitation. -Have resumed home medications. -Progressive  ambulation.  Advance diet. - Tylenol for pain   Best Practice (right click and "Reselect all SmartList Selections" daily)   Diet/type: -regular diet DVT prophylaxis: heparin 3 times daily GI prophylaxis: Not applicable Lines: Arterial line removal ordered Foley: Foley catheter removal ordered Code Status:  full code Last date of multidisciplinary goals of care discussion [Sister has been updated by phone.]  Matthew Brood, MD [facility] ICU Physician [place]  Pager: [phone] Mobile: [phone] After hours: [phone].

## 2022-12-19 NOTE — Progress Notes (Addendum)
   Cardiologist:  [name]   Subjective:   Cheerful.  Memory seems a bit better Spoke with son and daughter in law    Objective:  Vitals:   12/19/22 0400 12/19/22 0500 12/19/22 0600 12/19/22 0700  BP: 104/74 102/63 113/75 116/79  Pulse: 62 62 61 61  Resp: 15 (!) '28 15 12  '$ Temp:      TempSrc:      SpO2: 97% 97% 92% 97%  Weight:      Height:        Intake/Output from previous day:  Intake/Output Summary (Last 24 hours) at 12/19/2022 0825 Last data filed at 12/18/2022 2230 Gross per 24 hour  Intake 1200 ml  Output 2250 ml  Net -1050 ml    Physical Exam: Extubated NG out  Central line left subclavian Abdomen benign Trace edema Moving all extremities Disoriented to time/place  Lab Results: Basic Metabolic Panel: Recent Labs    12/17/22 0504 12/18/22 0550 12/19/22 0402  NA 138 140 137  K 3.9 3.9 4.0  CL 106 104 103  CO2 '25 28 25  '$ GLUCOSE 113* 107* 110*  BUN '15 16 12  '$ CREATININE 0.85 0.97 0.86  CALCIUM 8.5* 8.3* 8.6*  MG 1.9  --   --   PHOS 2.3*  --   --    Liver Function Tests: Recent Labs    12/17/22 0504  AST 76*  ALT 107*  ALKPHOS 59  BILITOT 1.4*  PROT 5.9*  ALBUMIN 3.3*   No results for input(s): "LIPASE", "AMYLASE" in the last 72 hours. CBC: Recent Labs    12/18/22 0424 12/19/22 0402  WBC 6.6 5.6  HGB 12.4* 13.6  HCT 36.1* 39.1  MCV 103.4* 101.3*  PLT 102* 112*    Fasting Lipid Panel: Recent Labs    12/18/22 0424  CHOL 170  HDL 45  LDLCALC 100*  TRIG 123  CHOLHDL 3.8     Imaging: No results found.  Cardiac Studies:  ECG: SR no acute changes normal QT PAC/PVC;s    Telemetry: SR    Echo: EF 55% small posterior wall motion ? Old   Medications:    aspirin EC  81 mg Oral Daily   Chlorhexidine Gluconate Cloth  6 each Topical Q0600   ezetimibe  10 mg Oral Daily   heparin  5,000 Units Subcutaneous Q8H   insulin aspart  0-20 Units Subcutaneous Q4H   levothyroxine  125 mcg Oral Daily   QUEtiapine  25 mg Oral BID    sodium chloride flush  3 mL Intravenous Q12H   sodium chloride flush  3 mL Intravenous Q12H   venlafaxine XR  112.5 mg Oral Daily      dexmedetomidine (PRECEDEX) IV infusion Stopped (12/17/22 2332)    Assessment/Plan:   Cardiac Arrest:  Shocked x 3 in field Cath with collateralized OM Elevated troponin 900 range not likely primary event as OM appeared chronically occluded with collaterals. EF 55% normal QT no acute ECG changes CTA negative dissection and no proximal PE. EP has seen hopefully will get pacer defibrillator on Monday  Note K was 2.9 on admission may have contributed to arrhythmia  Anoxic Brain Injury:  EEG with diffuse slowing CT negative per CCM/neurology  CAD:  stable on cath this admission add ASA/Zetia back   Note patient is left handed Consideration for AICD to be placed on right side   Jenkins Rouge 12/19/2022, 8:25 AM

## 2022-12-19 NOTE — H&P (View-Only) (Signed)
Electrophysiology Rounding Note  Patient Name: Matthew Pratt Date of Encounter: 12/20/2022   Primary Cardiologist: Elouise Munroe, MD Electrophysiologist: None   Subjective   The patient is doing well today.  At this time, the patient denies chest pain, shortness of breath, or any new concerns.  Inpatient Medications    Scheduled Meds:  aspirin EC  81 mg Oral Daily   Chlorhexidine Gluconate Cloth  6 each Topical Q0600   ezetimibe  10 mg Oral Daily   gentamicin (GARAMYCIN) 80 mg in sodium chloride 0.9 % 500 mL irrigation  80 mg Irrigation On Call   levothyroxine  125 mcg Oral Daily   lidocaine  1 patch Transdermal Q24H   pantoprazole  40 mg Oral Daily   QUEtiapine  25 mg Oral QHS   sodium chloride flush  3 mL Intravenous Q12H   sodium chloride flush  3 mL Intravenous Q12H   venlafaxine XR  112.5 mg Oral Daily   Continuous Infusions:  sodium chloride 50 mL/hr at 12/20/22 0600    ceFAZolin (ANCEF) IV     PRN Meds: acetaminophen, haloperidol lactate, ibuprofen, nitroGLYCERIN, ondansetron (ZOFRAN) IV, sodium chloride flush, traZODone   Vital Signs    Vitals:   12/20/22 0500 12/20/22 0506 12/20/22 0600 12/20/22 0718  BP: (!) 87/58 116/71 102/73   Pulse: (!) 56 68 (!) 56   Resp: '13 17 15   '$ Temp:    97.9 F (36.6 C)  TempSrc:    Oral  SpO2: 93% 94% 92%   Weight: 84 kg     Height:        Intake/Output Summary (Last 24 hours) at 12/20/2022 0732 Last data filed at 12/20/2022 0600 Gross per 24 hour  Intake 843.34 ml  Output 2880 ml  Net -2036.66 ml    Physical Exam    GEN- The patient is well appearing, alert and oriented x 3 today.   HEENT- No gross abnormality.  Lungs- Clear to ausculation bilaterally, normal work of breathing Heart- Regular rate and rhythm, no murmurs, rubs or gallops GI- soft, NT, ND, + BS Extremities- no clubbing or cyanosis. No edema Neuro- No obvious focal abnormality.   Telemetry    Sinus brady / NSR 50-60s (personally  reviewed)  Patient Profile     Matthew Pratt is a 71 y.o. male with a past medical history significant for CAD, BPH, HLD, and OSA on CPAP admitted for cardiac arrest, shocked x 3 in the field. Cath with collateralized OM, elevated trop ~900, not felt to be primary event.    Echo 12/15/2022 LVEF 55%, mildly reduced RV, mild RAE, Mild MR.  Cath 12/15/2022 Prox LAD to Mid LAD lesion is 55% stenosed.  Dist LAD lesion is 45% stenosed. Prox Cx to Mid Cx lesion is 55% stenosed. 1st Mrg (previously placed Drug Eluting stent is 100% stenosed -> CTO with left to left and right left lateral Prox RCA lesion is 60% stenosed. Mid RCA lesion is 55% stenosed.  Assessment & Plan    VF cardiac arrest Stable CAD with no target lesion.  Explained risks, benefits, and alternatives to ICD implantation, including but not limited to bleeding, infection, pneumothorax, pericardial effusion, lead dislodgement, heart attack, stroke, or death.  Pt verbalized understanding and agrees to proceed.  Given paroxysmal AV block, would plan DDD device  2. Intermittent AV block Pt had brief CHB when art line pulled.  Would plan atrial lead   For questions or updates, please contact [place] Please  consult [email or web address] for contact info under Cardiology/STEMI.  Signed, Shirley Friar, PA-C  12/20/2022, 7:32 AM   EP Attending  Patient seen and examined. Agree with above. The patient has sustained a VF arrest in the setting of a chronically occluded OM. He has preserved LV function. I have discussed the indications/risks/benefits/goals/expectations of ICD insertion for secondary prevention of VF and he wishes to proceed. Note transient CHB. We will plan to place an atrial lead.  Matthew Pratt Matthew Franko,MD

## 2022-12-19 NOTE — Progress Notes (Signed)
IP rehab admissions - screened for potential acute inpatient rehab admission per protocol.  Will place an order for rehab consult for a full assessment.  Call for questions.  [phone]

## 2022-12-19 NOTE — Progress Notes (Incomplete)
Electrophysiology Rounding Note  Patient Name: Matthew Pratt Date of Encounter: 12/20/2022   Primary Cardiologist: Elouise Munroe, MD Electrophysiologist: None   Subjective   The patient is doing well today.  At this time, the patient denies chest pain, shortness of breath, or any new concerns.  Inpatient Medications    Scheduled Meds:  aspirin EC  81 mg Oral Daily   Chlorhexidine Gluconate Cloth  6 each Topical Q0600   ezetimibe  10 mg Oral Daily   gentamicin (GARAMYCIN) 80 mg in sodium chloride 0.9 % 500 mL irrigation  80 mg Irrigation On Call   levothyroxine  125 mcg Oral Daily   lidocaine  1 patch Transdermal Q24H   pantoprazole  40 mg Oral Daily   QUEtiapine  25 mg Oral QHS   sodium chloride flush  3 mL Intravenous Q12H   sodium chloride flush  3 mL Intravenous Q12H   venlafaxine XR  112.5 mg Oral Daily   Continuous Infusions:  sodium chloride 50 mL/hr at 12/20/22 0600    ceFAZolin (ANCEF) IV     PRN Meds: acetaminophen, haloperidol lactate, ibuprofen, nitroGLYCERIN, ondansetron (ZOFRAN) IV, sodium chloride flush, traZODone   Vital Signs    Vitals:   12/20/22 0500 12/20/22 0506 12/20/22 0600 12/20/22 0718  BP: (!) 87/58 116/71 102/73   Pulse: (!) 56 68 (!) 56   Resp: '13 17 15   '$ Temp:    97.9 F (36.6 C)  TempSrc:    Oral  SpO2: 93% 94% 92%   Weight: 84 kg     Height:        Intake/Output Summary (Last 24 hours) at 12/20/2022 0732 Last data filed at 12/20/2022 0600 Gross per 24 hour  Intake 843.34 ml  Output 2880 ml  Net -2036.66 ml    Physical Exam    GEN- The patient is well appearing, alert and oriented x 3 today.   HEENT- No gross abnormality.  Lungs- Clear to ausculation bilaterally, normal work of breathing Heart- Regular rate and rhythm, no murmurs, rubs or gallops GI- soft, NT, ND, + BS Extremities- no clubbing or cyanosis. No edema Neuro- No obvious focal abnormality.   Telemetry    Sinus brady / NSR 50-60s (personally  reviewed)  Patient Profile     Matthew Pratt is a 72 y.o. male with a past medical history significant for CAD, BPH, HLD, and OSA on CPAP admitted for cardiac arrest, shocked x 3 in the field. Cath with collateralized OM, elevated trop ~900, not felt to be primary event.    Echo 12/15/2022 LVEF 55%, mildly reduced RV, mild RAE, Mild MR.  Cath 12/15/2022 Prox LAD to Mid LAD lesion is 55% stenosed.  Dist LAD lesion is 45% stenosed. Prox Cx to Mid Cx lesion is 55% stenosed. 1st Mrg (previously placed Drug Eluting stent is 100% stenosed -> CTO with left to left and right left lateral Prox RCA lesion is 60% stenosed. Mid RCA lesion is 55% stenosed.  Assessment & Plan    VF cardiac arrest Stable CAD with no target lesion.  Explained risks, benefits, and alternatives to ICD implantation, including but not limited to bleeding, infection, pneumothorax, pericardial effusion, lead dislodgement, heart attack, stroke, or death.  Pt verbalized understanding and agrees to proceed.  Given paroxysmal AV block, would plan DDD device  2. Intermittent AV block Pt had brief CHB when art line pulled.  Would plan atrial lead   For questions or updates, please contact [place] Please  consult [email or web address] for contact info under Cardiology/STEMI.  Signed, Shirley Friar, PA-C  12/20/2022, 7:32 AM   EP Attending  Patient seen and examined. Agree with above. The patient has sustained a VF arrest in the setting of a chronically occluded OM. He has preserved LV function. I have discussed the indications/risks/benefits/goals/expectations of ICD insertion for secondary prevention of VF and he wishes to proceed. Note transient CHB. We will plan to place an atrial lead.  Carleene Overlie Lexii Walsh,MD

## 2022-12-20 ENCOUNTER — Encounter (HOSPITAL_COMMUNITY)
Admission: EM | Disposition: A | Discharge status: Home Health | Payer: Self-pay | Source: Home / Self Care | Attending: Pulmonary Disease

## 2022-12-20 ENCOUNTER — Inpatient Hospital Stay (HOSPITAL_COMMUNITY): Payer: Medicare Other

## 2022-12-20 ENCOUNTER — Other Ambulatory Visit: Payer: Self-pay

## 2022-12-20 DIAGNOSIS — I255 Ischemic cardiomyopathy: Secondary | ICD-10-CM

## 2022-12-20 DIAGNOSIS — I469 Cardiac arrest, cause unspecified: Secondary | ICD-10-CM | POA: Diagnosis not present

## 2022-12-20 DIAGNOSIS — I442 Atrioventricular block, complete: Secondary | ICD-10-CM | POA: Insufficient documentation

## 2022-12-20 DIAGNOSIS — E039 Hypothyroidism, unspecified: Secondary | ICD-10-CM

## 2022-12-20 HISTORY — PX: ICD IMPLANT: EP1208

## 2022-12-20 LAB — CULTURE, BLOOD (ROUTINE X 2)
Culture: NO GROWTH
Culture: NO GROWTH
Special Requests: ADEQUATE
Special Requests: ADEQUATE

## 2022-12-20 LAB — CBC
HCT: 40.3 % (ref 39.0–52.0)
Hemoglobin: 13.6 g/dL (ref 13.0–17.0)
MCH: 34.5 pg — ABNORMAL HIGH (ref 26.0–34.0)
MCHC: 33.7 g/dL (ref 30.0–36.0)
MCV: 102.3 fL — ABNORMAL HIGH (ref 80.0–100.0)
Platelets: 140 10*3/uL — ABNORMAL LOW (ref 150–400)
RBC: 3.94 MIL/uL — ABNORMAL LOW (ref 4.22–5.81)
RDW: 12 % (ref 11.5–15.5)
WBC: 5.6 10*3/uL (ref 4.0–10.5)
nRBC: 0 % (ref 0.0–0.2)

## 2022-12-20 LAB — BASIC METABOLIC PANEL
Anion gap: 10 (ref 5–15)
BUN: 15 mg/dL (ref 8–23)
CO2: 25 mmol/L (ref 22–32)
Calcium: 8.8 mg/dL — ABNORMAL LOW (ref 8.9–10.3)
Chloride: 103 mmol/L (ref 98–111)
Creatinine, Ser: 0.96 mg/dL (ref 0.61–1.24)
GFR, Estimated: >60 mL/min (ref >60)
Glucose, Bld: 118 mg/dL — ABNORMAL HIGH (ref 70–99)
Potassium: 4.1 mmol/L (ref 3.5–5.1)
Sodium: 138 mmol/L (ref 135–145)

## 2022-12-20 SURGERY — ICD IMPLANT

## 2022-12-20 MED ORDER — LIDOCAINE HCL 1 % IJ SOLN
INTRAMUSCULAR | Status: AC
  Filled 2022-12-20: qty 20

## 2022-12-20 MED ORDER — ACETAMINOPHEN 325 MG PO TABS: 325.0000 mg | ORAL_TABLET | ORAL | Status: DC | PRN

## 2022-12-20 MED ORDER — FENTANYL CITRATE (PF) 100 MCG/2ML IJ SOLN
INTRAMUSCULAR | Status: DC | PRN
  Administered 2022-12-20 (×2): 12.5 ug via INTRAVENOUS

## 2022-12-20 MED ORDER — MIDAZOLAM HCL 5 MG/5ML IJ SOLN
INTRAMUSCULAR | Status: AC
  Filled 2022-12-20: qty 5

## 2022-12-20 MED ORDER — HEPARIN (PORCINE) IN NACL 1000-0.9 UT/500ML-% IV SOLN
INTRAVENOUS | Status: DC | PRN
  Administered 2022-12-20: 500 mL

## 2022-12-20 MED ORDER — CEFAZOLIN SODIUM-DEXTROSE 1-4 GM/50ML-% IV SOLN: 1.0000 g | Freq: Four times a day (QID) | INTRAVENOUS | Status: DC

## 2022-12-20 MED ORDER — CHLORHEXIDINE GLUCONATE 4 % EX LIQD: 60.0000 mL | Freq: Once | CUTANEOUS | Status: DC

## 2022-12-20 MED ORDER — FENTANYL CITRATE (PF) 100 MCG/2ML IJ SOLN
INTRAMUSCULAR | Status: AC
  Filled 2022-12-20: qty 2

## 2022-12-20 MED ORDER — SODIUM CHLORIDE 0.9 % IV SOLN
INTRAVENOUS | Status: AC | PRN
  Administered 2022-12-20: 250 mL via INTRAVENOUS

## 2022-12-20 MED ORDER — METHYLPREDNISOLONE SODIUM SUCC 125 MG IJ SOLR
125.0000 mg | Freq: Once | INTRAMUSCULAR | Status: AC
  Administered 2022-12-20: 125 mg via INTRAVENOUS
  Filled 2022-12-20: qty 2

## 2022-12-20 MED ORDER — CEFAZOLIN SODIUM-DEXTROSE 2-4 GM/100ML-% IV SOLN
INTRAVENOUS | Status: AC
  Filled 2022-12-20: qty 100

## 2022-12-20 MED ORDER — SODIUM CHLORIDE 0.9 % IV SOLN
INTRAVENOUS | Status: AC
  Filled 2022-12-20: qty 2

## 2022-12-20 MED ORDER — LIDOCAINE HCL (PF) 1 % IJ SOLN
INTRAMUSCULAR | Status: DC | PRN
  Administered 2022-12-20: 60 mL

## 2022-12-20 MED ORDER — CEFAZOLIN SODIUM-DEXTROSE 2-4 GM/100ML-% IV SOLN
2.0000 g | INTRAVENOUS | Status: AC
  Administered 2022-12-20: 2 g via INTRAVENOUS

## 2022-12-20 MED ORDER — SODIUM CHLORIDE 0.9 % IV SOLN
80.0000 mg | INTRAVENOUS | Status: AC
  Administered 2022-12-20: 80 mg

## 2022-12-20 MED ORDER — MIDAZOLAM HCL 5 MG/5ML IJ SOLN
INTRAMUSCULAR | Status: DC | PRN
  Administered 2022-12-20 (×2): 1 mg via INTRAVENOUS

## 2022-12-20 MED ORDER — SODIUM CHLORIDE 0.9 % IV SOLN: INTRAVENOUS | Status: DC

## 2022-12-20 MED ORDER — ONDANSETRON HCL 4 MG/2ML IJ SOLN: 4.0000 mg | Freq: Four times a day (QID) | INTRAMUSCULAR | Status: DC | PRN

## 2022-12-20 MED ORDER — LIDOCAINE HCL (PF) 1 % IJ SOLN
INTRAMUSCULAR | Status: AC
  Filled 2022-12-20: qty 30

## 2022-12-20 MED ORDER — DIPHENHYDRAMINE HCL 50 MG/ML IJ SOLN
25.0000 mg | Freq: Once | INTRAMUSCULAR | Status: AC
  Administered 2022-12-20: 25 mg via INTRAVENOUS
  Filled 2022-12-20: qty 1

## 2022-12-20 MED ORDER — HEPARIN (PORCINE) IN NACL 1000-0.9 UT/500ML-% IV SOLN
INTRAVENOUS | Status: AC
  Filled 2022-12-20: qty 500

## 2022-12-20 SURGICAL SUPPLY — 8 items
CABLE SURGICAL S-101-97-12 (CABLE) ×1 IMPLANT
ICD ELLIPSE DR CD2411-36C (ICD Generator) IMPLANT
LEAD DURATA 7122-65CM (Lead) IMPLANT
LEAD ULTIPACE 52 LPA1231/52 (Lead) IMPLANT
PAD DEFIB RADIO PHYSIO CONN (PAD) ×1 IMPLANT
SHEATH 7FR PRELUDE SNAP 13 (SHEATH) IMPLANT
SHEATH 7FR PRELUDE SNAP 25 (SHEATH) IMPLANT
TRAY PACEMAKER INSERTION (PACKS) ×1 IMPLANT

## 2022-12-20 NOTE — Evaluation (Signed)
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 10/28/2018   Irritable bowel syndrome with diarrhea 10/28/2018   Hypothyroid 07/26/2018   GERD (gastroesophageal reflux disease) 07/26/2018   Barrett's esophagus 07/26/2018   Rosacea 07/26/2018   Medicare annual wellness visit, initial 02/24/2018   Dupuytren's contracture of hand 06/23/2017   H/O acute myocardial infarction 06/16/2017   Fatigue 06/16/2017   Benign prostatic hyperplasia with urinary hesitancy 06/16/2017   OSA (obstructive sleep apnea) 12/03/2016   Depression, recurrent ([place]) 12/03/2016   Heartburn 10/15/2016   Moderate episode of recurrent major depressive disorder ([place]) 07/22/2016   Atypical chest pain 12/26/2015   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   Erectile dysfunction due to arterial insufficiency 03/26/2015   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   Hyperlipidemia 10/26/2010   DEPRESSION/ANXIETY 10/26/2010   GLAUCOMA 10/26/2010   MYOCARDIAL INFARCTION, ACUTE, INFEROLATERAL 10/26/2010   CAD, NATIVE VESSEL 10/26/2010   SINUS  BRADYCARDIA 10/26/2010   Glaucoma 10/26/2010   Past Medical History:  Past Medical History:  Diagnosis Date   Anxiety    Arthritis    BPH (benign prostatic hypertrophy)    CAD (coronary artery disease)    s/p STEMI 10/15/10 w/ Promus DES placed x1 in the first OM   Colon polyps    Complication of anesthesia    pt. reported 2015 penile inplant when getting some anesthesia had chest pain,they readjusted  the meds. and continued with surgery. Had no further problems di not have to follow up with anyone.   Depression    Depression with anxiety    Dupuytren's contracture of hand    ED (erectile dysfunction)    GERD (gastroesophageal reflux disease)    Glaucoma    HLD (hyperlipidemia)    Hypocontractile bladder    Hypothyroidism    MI (myocardial infarction) ([place])    denies   OSA on CPAP    Peyronie's disease    Rosacea    Skin cancer    BCC   Past Surgical History:  Past Surgical History:  Procedure Laterality Date   CHOLECYSTECTOMY N/A 02/10/2021   Procedure: LAPAROSCOPIC CHOLECYSTECTOMY;  Surgeon: Coralie Keens, MD;  Location: [facility];  Service: General;  Laterality: N/A;   COLONOSCOPY WITH ESOPHAGOGASTRODUODENOSCOPY (EGD)     multiple   CORONARY STENT PLACEMENT     INNER EAR SURGERY     LAPAROSCOPIC CHOLECYSTECTOMY Bilateral 02/09/2021   Dr.[email or web address]   LASIK  02/1998   LEFT HEART CATH AND CORONARY ANGIOGRAPHY N/A 12/15/2022   Procedure: LEFT HEART CATH AND CORONARY ANGIOGRAPHY;  Surgeon: Leonie Man, MD;  Location: [facility];  Service: Cardiovascular;  Laterality: N/A;   PENILE PROSTHESIS IMPLANT  TONSILECTOMY, ADENOIDECTOMY, BILATERAL MYRINGOTOMY AND TUBES     TONSILLECTOMY     VASECTOMY     WISDOM TOOTH EXTRACTION     HPI:  72 year old man presenting with witnessed OOH cardiac arrest +fall witnessed by neighbor. Total arrest time 8-9 minutes with 3 AED shocks. PMHx of CAD, prior STEMI, HLD, GERD, OSA, hypothyroidism, GB issues   Assessment / Plan /  Recommendation Clinical Impression  Pt presents with deficits in cognition specific to awareness,  higher-level attention, and declarative memory.  He demonstrated ability to use mnemonic strategies to help with recall.  His awareness of deficits improved during session.  Expressive/receptive language and speech are WNL as would be expected.  Agree that AIR would be beneficial for rehab - d/w pt, who expressed concern about deficits and agrees that rehab will be necessary. SLP will follow.    SLP Assessment  SLP Recommendation/Assessment: Patient needs continued Speech [place] [facility] SLP Visit Diagnosis: Cognitive communication deficit (R41.841)    Recommendations for follow up therapy are one component of a multi-disciplinary discharge planning process, led by the attending physician.  Recommendations may be updated based on patient status, additional functional criteria and insurance authorization.    Follow Up Recommendations  Acute inpatient rehab (3hours/day)    Assistance Recommended at Discharge  Frequent or constant Supervision/Assistance  Functional Status Assessment Patient has had a recent decline in their functional status and demonstrates the ability to make significant improvements in function in a reasonable and predictable amount of time.  Frequency and Duration min 2x/week  2 weeks      SLP Evaluation Cognition  Overall Cognitive Status: Impaired/Different from baseline Arousal/Alertness: Awake/alert Orientation Level: Oriented to person;Oriented to place;Disoriented to time Attention: Alternating Alternating Attention: Impaired Alternating Attention Impairment: Verbal basic Memory: Impaired Memory Impairment: Storage deficit;Retrieval deficit Awareness: Impaired Awareness Impairment: Intellectual impairment       Comprehension  Auditory Comprehension Overall Auditory Comprehension: Appears within functional limits for tasks assessed Reading  Comprehension Reading Status: Within funtional limits    Expression Expression Primary Mode of Expression: Verbal Verbal Expression Overall Verbal Expression: Appears within functional limits for tasks assessed Written Expression Dominant Hand: Left Written Expression: Within Functional Limits   Oral / Motor  Oral Motor/Sensory Function Overall Oral Motor/Sensory Function: Within functional limits Motor Speech Overall Motor Speech: Appears within functional limits for tasks assessed            Juan Quam Laurice 12/20/2022, 10:02 AM  Estill Bamberg L. Tivis Ringer, MA CCC/SLP Clinical Specialist - [place] Office number [phone]

## 2022-12-20 NOTE — Progress Notes (Signed)
Rounding Note    Patient Name: Matthew Pratt Date of Encounter: 12/20/2022  [place] Cardiologist: Elouise Munroe, MD   Subjective   Feeing well.  Memory improving.   Inpatient Medications    Scheduled Meds:  aspirin EC  81 mg Oral Daily   Chlorhexidine Gluconate Cloth  6 each Topical Q0600   ezetimibe  10 mg Oral Daily   gentamicin (GARAMYCIN) 80 mg in sodium chloride 0.9 % 500 mL irrigation  80 mg Irrigation On Call   levothyroxine  125 mcg Oral Daily   lidocaine  1 patch Transdermal Q24H   pantoprazole  40 mg Oral Daily   QUEtiapine  25 mg Oral QHS   sodium chloride flush  3 mL Intravenous Q12H   sodium chloride flush  3 mL Intravenous Q12H   venlafaxine XR  112.5 mg Oral Daily   Continuous Infusions:  sodium chloride 50 mL/hr at 12/20/22 0600    ceFAZolin (ANCEF) IV     PRN Meds: acetaminophen, haloperidol lactate, ibuprofen, nitroGLYCERIN, ondansetron (ZOFRAN) IV, sodium chloride flush, traZODone   Vital Signs    Vitals:   12/20/22 0500 12/20/22 0506 12/20/22 0600 12/20/22 0718  BP: (!) 87/58 116/71 102/73   Pulse: (!) 56 68 (!) 56   Resp: '13 17 15   '$ Temp:    97.9 F (36.6 C)  TempSrc:    Oral  SpO2: 93% 94% 92%   Weight: 84 kg     Height:        Intake/Output Summary (Last 24 hours) at 12/20/2022 0913 Last data filed at 12/20/2022 0600 Gross per 24 hour  Intake 603.34 ml  Output 2880 ml  Net -2276.66 ml      12/20/2022    5:00 AM 12/17/2022    5:00 AM 12/16/2022    3:55 AM  Last 3 Weights  Weight (lbs) 185 lb 3 oz 198 lb 13.7 oz 198 lb 13.7 oz  Weight (kg) 84 kg 90.2 kg 90.2 kg      Telemetry    Sinus rhythm.  PVCs.  NSVT - Personally Reviewed  ECG    N/a - Personally Reviewed  Physical Exam   VS:  BP 102/73   Pulse (!) 56   Temp 97.9 F (36.6 C) (Oral)   Resp 15   Ht '6\' 1"'$  (1.854 m)   Wt 84 kg   SpO2 92%   BMI 24.43 kg/m  , BMI Body mass index is 24.43 kg/m. GENERAL:  Well appearing HEENT: Pupils equal  round and reactive, fundi not visualized, oral mucosa unremarkable NECK:  No jugular venous distention, waveform within normal limits, carotid upstroke brisk and symmetric, no bruits, no thyromegaly LUNGS:  Clear to auscultation bilaterally HEART:  RRR.  PMI not displaced or sustained,S1 and S2 within normal limits, no S3, no S4, no clicks, no rubs, no murmurs ABD:  Flat, positive bowel sounds normal in frequency in pitch, no bruits, no rebound, no guarding, no midline pulsatile mass, no hepatomegaly, no splenomegaly EXT:  2 plus pulses throughout, no edema, no cyanosis no clubbing SKIN:  No rashes no nodules NEURO:  Cranial nerves II through XII grossly intact, motor grossly intact throughout PSYCH:  Cognitively intact, oriented to person place and time   Labs    High Sensitivity Troponin:   Recent Labs  Lab 12/15/22 1310 12/15/22 2115 12/16/22 0251 12/16/22 0359  TROPONINIHS 60* 910* 594* 518*     Chemistry Recent Labs  Lab 12/15/22 1310 12/15/22 1317  12/15/22 2350 12/16/22 0359 12/16/22 1605 12/17/22 0504 12/18/22 0550 12/19/22 0402 12/20/22 0041  NA 134*   < > 137 135   < > 138 140 137 138  K 4.3   < > 3.2* 3.8   < > 3.9 3.9 4.0 4.1  CL 100   < > 102 103  --  106 104 103 103  CO2 19*  --  11* 15*  --  '25 28 25 25  '$ GLUCOSE 107*   < > 265* 235*  --  113* 107* 110* 118*  BUN 18   < > 22 22  --  '15 16 12 15  '$ CREATININE 1.55*   < > 1.56* 1.34*  --  0.85 0.97 0.86 0.96  CALCIUM 8.8*  --  8.7* 8.8*  --  8.5* 8.3* 8.6* 8.8*  MG 2.0  --  2.0 2.0  --  1.9  --   --   --   PROT 6.7  --   --  6.6  --  5.9*  --   --   --   ALBUMIN 3.8  --   --  3.7  --  3.3*  --   --   --   AST 83*  --   --  135*  --  76*  --   --   --   ALT 82*  --   --  177*  --  107*  --   --   --   ALKPHOS 80  --   --  74  --  59  --   --   --   BILITOT 1.3*  --   --  1.0  --  1.4*  --   --   --   GFRNONAA 47*   < > 47* 56*  --  >60 >60 >60 >60  ANIONGAP 15  --  24* 17*  --  '7 8 9 10   '$ < > = values in  this interval not displayed.    Lipids  Recent Labs  Lab 12/18/22 0424  CHOL 170  TRIG 123  HDL 45  LDLCALC 100*  CHOLHDL 3.8    Hematology Recent Labs  Lab 12/18/22 0424 12/19/22 0402 12/20/22 0041  WBC 6.6 5.6 5.6  RBC 3.49* 3.86* 3.94*  HGB 12.4* 13.6 13.6  HCT 36.1* 39.1 40.3  MCV 103.4* 101.3* 102.3*  MCH 35.5* 35.2* 34.5*  MCHC 34.3 34.8 33.7  RDW 12.4 12.1 12.0  PLT 102* 112* 140*   Thyroid No results for input(s): "TSH", "FREET4" in the last 168 hours.  BNPNo results for input(s): "BNP", "PROBNP" in the last 168 hours.  DDimer No results for input(s): "DDIMER" in the last 168 hours.   Radiology    No results found.  Cardiac Studies   Echo 12/15/22:  1. Left ventricular ejection fraction, by estimation, is 55%. The left  ventricle has low normal function. The left ventricle demonstrates  regional wall motion abnormalities (see scoring diagram/findings for  description). Left ventricular diastolic  parameters were normal.   2. Right ventricular systolic function is mildly reduced. The right  ventricular size is mildly enlarged. Tricuspid regurgitation signal is  inadequate for assessing PA pressure.   3. Right atrial size was mildly dilated.   4. The mitral valve is grossly normal. Mild mitral valve regurgitation.  No evidence of mitral stenosis.   5. The aortic valve is grossly normal. There is mild thickening of the  aortic valve. Aortic valve regurgitation  is not visualized. No aortic  stenosis is present.   6. The inferior vena cava is normal in size with greater than 50%  respiratory variability, suggesting right atrial pressure of 3 mmHg.   LHC 12/15/22:   Prox LAD to Mid LAD lesion is 55% stenosed.  Dist LAD lesion is 45% stenosed.   Prox Cx to Mid Cx lesion is 55% stenosed.   1st Mrg (previously placed Drug Eluting stent is 100% stenosed -> CTO with left to left and right left lateral   Prox RCA lesion is 60% stenosed. Mid RCA lesion is 55%  stenosed.   LV end diastolic pressure is low.   There is no aortic valve stenosis.   POST-CATH DIAGNOSES Diffuse moderate to severe disease with calcification in all the proximal vessels, and likely CTO of previous OM1 stent filling via collaterals from both left to right and right to left. No obvious culprit lesion to explain cardiac arrest. Low LVEDP of 6 mmHg. A-line pressures were in the high 80s to low 90s compared to cuff . Hypokalemia noted on labs-potassium 2.9.  Was started on IV potassium 10 mg once     Patient Profile     72 y.o. male  with CAD, OSA on CPAP, hypothyroidism, and HL admitted with cardiac arrest.  Assessment & Plan    # Cardiac arrest: Etiology not identified.  Stable CAD and normal LVEF this admission.  ?CHB-->VT. EP to place ICD this admission.  # Intermittent AV block:  Brief CHB when arterial line was pulled.  He will get a dual chamber device.   # Anoxic brain injury: Improving.   # CAD:  # Hyperlipidemia: Cath showed moderate disease with CTO OM with R-->L collaterals.  Continue medical management.  Continue aspirin and Zetia.  Lipids not well-controlled.  Consider non-statin alternatives as an outpatient.  Beta blocker as able after device implantation.  # AKI: Resolved.     For questions or updates, please contact [place] Please consult [email or web address] for contact info under        Signed, Skeet Latch, MD  12/20/2022, 9:13 AM

## 2022-12-20 NOTE — TOC Initial Note (Addendum)
Transition of Care [facility]) - Initial/Assessment Note    Patient Details  Name: Matthew Pratt MRN: [id number] Date of Birth: 1951/03/20  Transition of Care [facility]) CM/SW Contact:    Milas Gain, [place] Phone Number: 12/20/2022, 2:28 PM  Clinical Narrative:                  CSW received consult for possible SNF placement at time of discharge. Due to patients current orientation CSW spoke with patients sister Matthew Pratt. Matthew Pratt requested CSW follow up with patients son Matthew Pratt regarding PT recommendation of SNF placement for patient  at time of discharge. CSW LVM for patients son Matthew Pratt. CSW awaiting call back to discuss PT recommendation of SNF placement for patient at time of discharge. CSW will continue to follow and assist with patients dc planning needs.  Update- CSW received call back from Matthew Pratt patients son regarding Pt recommendation for SNF placement for patient.Patients son  expressed understanding of PT recommendation and is agreeable to SNF placement for patient at time of discharge. PTA patients son reports patient comes from home alone.Patients son gave CSW permission to fax out initial referral near the [place] area for possible SNF placement. CSW discussed insurance authorization process and will provide Medicare SNF ratings list with accepted SNF bed offers when available.   No further questions reported at this time. CSW to continue to follow and assist with discharge planning needs.       Expected Discharge Plan: IP Rehab Facility Barriers to Discharge: Continued Medical Work up   Patient Goals and CMS Choice Patient states their goals for this hospitalization and ongoing recovery are:: wants to get back to his baseline          Expected Discharge Plan and Services   Discharge Planning Services: CM Consult   Living arrangements for the past 2 months: Single Family Home                                      Prior Living Arrangements/Services Living arrangements for  the past 2 months: Single Family Home Lives with:: Self Patient language and need for interpreter reviewed:: Yes        Need for Family Participation in Patient Care: Yes (Comment) Care giver support system in place?: Yes (comment)   Criminal Activity/Legal Involvement Pertinent to Current Situation/Hospitalization: No - Comment as needed  Activities of Daily Living Home Assistive Devices/Equipment: None ADL Screening (condition at time of admission) Patient's cognitive ability adequate to safely complete daily activities?: Yes Is the patient deaf or have difficulty hearing?: No Does the patient have difficulty seeing, even when wearing glasses/contacts?: No Does the patient have difficulty concentrating, remembering, or making decisions?: No Patient able to express need for assistance with ADLs?: Yes Does the patient have difficulty dressing or bathing?: No Independently performs ADLs?: Yes (appropriate for developmental age) Does the patient have difficulty walking or climbing stairs?: No Weakness of Legs: None Weakness of Arms/Hands: None  Permission Sought/Granted Permission sought to share information with : Case Manager, Family Supports, PCP Permission granted to share information with : Yes, Verbal Permission Granted  Share Information with NAME: [place] granted to share info w Relationship: sister  Permission granted to share info w Contact Information: [phone]  Emotional Assessment Appearance:: Appears stated age Attitude/Demeanor/Rapport: Engaged Affect (typically observed): Accepting Orientation: : Oriented to Self, Fluctuating Orientation (Suspected and/or  reported [place])      Admission diagnosis:  Cardiac arrest [facility]) [I46.9] Patient Active Problem List   Diagnosis Date Noted   CHB (complete heart block) ([place]) 12/20/2022   Cardiac arrest ([place]) 12/15/2022   CAD S/P percutaneous coronary angioplasty 12/15/2022   Chronic insomnia  09/30/2021   Statin myopathy 03/25/2021   S/P laparoscopic cholecystectomy 02/10/2021   Laryngitis from reflux of stomach acid 11/11/2020   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 10/28/2018   Irritable bowel syndrome with diarrhea 10/28/2018   Hypothyroid 07/26/2018   GERD (gastroesophageal reflux disease) 07/26/2018   Barrett's esophagus 07/26/2018   Rosacea 07/26/2018   Medicare annual wellness visit, initial 02/24/2018   Dupuytren's contracture of hand 06/23/2017   H/O acute myocardial infarction 06/16/2017   Fatigue 06/16/2017   Benign prostatic hyperplasia with urinary hesitancy 06/16/2017   OSA (obstructive sleep apnea) 12/03/2016   Depression, recurrent ([place]) 12/03/2016   Heartburn 10/15/2016   Moderate episode of recurrent major depressive disorder ([place]) 07/22/2016   Atypical chest pain 12/26/2015   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   Erectile dysfunction due to arterial insufficiency 03/26/2015   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   Hyperlipidemia 10/26/2010   DEPRESSION/ANXIETY 10/26/2010   GLAUCOMA 10/26/2010   MYOCARDIAL INFARCTION, ACUTE, INFEROLATERAL 10/26/2010   CAD, NATIVE VESSEL 10/26/2010   SINUS BRADYCARDIA 10/26/2010   Glaucoma 10/26/2010   PCP:  [name], MD Pharmacy:   [id number] [place] [place]Phone: [phone]: [phone] [address] [place]Phone:  [phone]: [phone]    Social Determinants of Health (SDOH) Social History: [place] No Food Insecurity (12/16/2022)  Housing: Low Risk  (12/16/2022)  Transportation Needs: No Transportation Needs (12/16/2022)  Utilities: Not At Risk (12/16/2022)  Alcohol Screen: Low Risk  (04/19/2022)  Depression (PHQ2-9): Medium Risk (08/13/2022)  Financial Resource Strain: Medium Risk (04/19/2022)  Physical Activity: Inactive (04/19/2022)  Social Connections: Moderately Isolated (04/19/2022)  Stress: Stress Concern Present (04/19/2022)  Tobacco Use: Low Risk  (12/16/2022)   SDOH Interventions: Food Insecurity Interventions: Intervention Not Indicated Housing Interventions: Intervention Not Indicated Transportation Interventions: Intervention Not Indicated Utilities Interventions: Intervention Not Indicated   Readmission Risk Interventions     No data to display

## 2022-12-20 NOTE — Progress Notes (Signed)
Physical Therapy Treatment Patient Details Name: Matthew Pratt MRN: [id number] DOB: 1951-10-05 Today's Date: 12/20/2022   History of Present Illness 72 year old man presenting with witnessed OOH cardiac arrest +fall witnessed by neighbor. Total arrest time 8-9 minutes with 3 AED shocks. PMHx of CAD, prior STEMI, HLD, GERD, OSA, hypothyroidism, GB issues    PT Comments    Pt making steady progress towards his physical therapy goals. Exhibits improvement in memory I.e. recalling SLP session this morning, however, does not recall son visiting or working with PT on Saturday. Session focused on progressive ambulation, dynamic balance, and dual tasking during ambulation. Pt with decreased gait speed during dual tasking and noted difficulty with problem solving I.e. naming animals that start with letter "A." Will continue to progress as tolerated.    Recommendations for follow up therapy are one component of a multi-disciplinary discharge planning process, led by the attending physician.  Recommendations may be updated based on patient status, additional functional criteria and insurance authorization.  Follow Up Recommendations  Skilled nursing-short term rehab (<3 hours/day) ((could d/c home if 24/7 assist was available)) Can patient physically be transported by private vehicle: Yes   Assistance Recommended at Discharge Frequent or constant Supervision/Assistance  Patient can return home with the following A little help with walking and/or transfers;A little help with bathing/dressing/bathroom;Assistance with cooking/housework;Direct supervision/assist for medications management;Assist for transportation;Direct supervision/assist for financial management;Help with stairs or ramp for entrance   Equipment Recommendations  None recommended by PT    Recommendations for Other Services       Precautions / Restrictions Precautions Precautions: Fall Restrictions Weight Bearing Restrictions: No      Mobility  Bed Mobility Overal bed mobility: Needs Assistance Bed Mobility: Supine to Sit     Supine to sit: Supervision          Transfers Overall transfer level: Needs assistance Equipment used: None Transfers: Sit to/from Stand Sit to Stand: Min guard                Ambulation/Gait Ambulation/Gait assistance: Min assist, Min guard Gait Distance (Feet): 350 Feet Assistive device: None Gait Pattern/deviations: Step-through pattern, Decreased stride length, Scissoring       General Gait Details: One episode of scissoring and LOB during lateral stepping, requiring minA to correct. Decreased gait speed with dual tasking   Stairs             Wheelchair Mobility    Modified Rankin (Stroke Patients Only)       Balance Overall balance assessment: Mild deficits observed, not formally tested                                          Cognition Arousal/Alertness: Awake/alert Behavior During Therapy: WFL for tasks assessed/performed Overall Cognitive Status: Impaired/Different from baseline Area of Impairment: Orientation, Attention, Memory, Following commands, Safety/judgement, Awareness, Problem solving                 Orientation Level: Disoriented to, Time Current Attention Level: Alternating Memory: Decreased short-term memory Following Commands: Follows multi-step commands consistently Safety/Judgement: Decreased awareness of safety, Decreased awareness of deficits Awareness: Emergent Problem Solving: Slow processing, Decreased initiation, Difficulty sequencing, Requires verbal cues General Comments: Unable to recall year/month despite frequent re-orientation. impulsive with little insight and problem solving. very poor attention, easily distracted by environmental stimuli. requires simple one step directions  Exercises Other Exercises Other Exercises: Dynamic balance: Lateral stepping to R/L, backwards walking,  head turns, stepping over obstacles Other Exercises: Dual tasking during ambulation: naming animals that start with letter "A", "B,"naming states that start with letter "S," counting backwards by 3 from 20    General Comments        Pertinent Vitals/Pain Pain Assessment Pain Assessment: No/denies pain    Home Living                          Prior Function            PT Goals (current goals can now be found in the care plan section) Acute Rehab PT Goals Patient Stated Goal: for memory to improve Potential to Achieve Goals: Good Progress towards PT goals: Progressing toward goals    Frequency    Min 2X/week      PT Plan Discharge plan needs to be updated;Frequency needs to be updated    Co-evaluation              AM-PAC PT "6 Clicks" Mobility   Outcome Measure  Help needed turning from your back to your side while in a flat bed without using bedrails?: None Help needed moving from lying on your back to sitting on the side of a flat bed without using bedrails?: None Help needed moving to and from a bed to a chair (including a wheelchair)?: A Little Help needed standing up from a chair using your arms (e.g., wheelchair or bedside chair)?: A Little Help needed to walk in hospital room?: A Little Help needed climbing 3-5 steps with a railing? : A Little 6 Click Score: 20    End of Session   Activity Tolerance: Patient tolerated treatment well Patient left: in chair;with call bell/phone within reach;with chair alarm set Nurse Communication: Mobility status PT Visit Diagnosis: Unsteadiness on feet (R26.81)     Time: 9169-4503 PT Time Calculation (min) (ACUTE ONLY): 22 min  Charges:  $Therapeutic Activity: 8-22 mins                     Matthew Pratt, PT, DPT Acute Rehabilitation Services Office [phone]    Matthew Pratt 12/20/2022, 1:23 PM

## 2022-12-20 NOTE — Progress Notes (Signed)
NAME:  Danial Hlavac, MRN:  [id number], DOB:  1951-08-16, LOS: 4 ADMISSION DATE:  12/15/2022, CONSULTATION DATE:  12/15/21 REFERRING MD:  EDP, CHIEF COMPLAINT:  arrest   History of Present Illness:  72 year old man w/ hx of CAD, prior STEMI, HLD, GERD, OSA, hypothyroidism, GB issues presenting with witnessed OOH cardiac arrest.  +fall witnessed by neighbor.  Unclear initial rhtyhm but did get 3 AED shocks.  Total arrest time x 8-9 minutes.  Breathing on ROSC but not following commands.  Labs/imaging pending.  EKG no STEMI.  CCM to admit.  Pertinent  Medical History   Past Medical History:  Diagnosis Date   Anxiety    Arthritis    BPH (benign prostatic hypertrophy)    CAD (coronary artery disease)    s/p STEMI 10/15/10 w/ Promus DES placed x1 in the first OM   Colon polyps    Complication of anesthesia    pt. reported 2015 penile inplant when getting some anesthesia had chest pain,they readjusted  the meds. and continued with surgery. Had no further problems di not have to follow up with anyone.   Depression    Depression with anxiety    Dupuytren's contracture of hand    ED (erectile dysfunction)    GERD (gastroesophageal reflux disease)    Glaucoma    HLD (hyperlipidemia)    Hypocontractile bladder    Hypothyroidism    MI (myocardial infarction) ([place])    denies   OSA on CPAP    Peyronie's disease    Rosacea    Skin cancer    BCC   Significant Hospital Events: Including procedures, antibiotic start and stop dates in addition to other pertinent events   1/10 admit, king exchanged for ETT 1/11 Starting to wake up agitated, apnea on ventilation initially became consistently awake and successfully extubated. 1/12 Had a prolonged period of ventricular standstill and complete heart block with dizziness while sitting up.  Evaluated by EPS for pacemaker/defibrillator 1/13 to 1/15 stable in ICU. No PE on CTA. Creatinine improving to 0.85 Initial troponin negative rose to 910.   posterior wall is hypokinetic.  Normal RV function with normal estimated PA pressures.  No significant valvular abnormalities. Echocardiogram shows EF 55% with regional wall motion abnormalities.  Interim History / Subjective:  No distress. Does have some discomfort w/ deep breath   Objective   Blood pressure 116/79, pulse 61, temperature 98.6 F (37 C), temperature source Oral, resp. rate 12, height '6\' 1"'$  (1.854 m), weight 90.2 kg, SpO2 97 %.        Intake/Output Summary (Last 24 hours) at 12/19/2022 0922 Last data filed at 12/19/2022 0900 Gross per 24 hour  Intake 1200 ml  Output 2250 ml  Net -1050 ml    Filed Weights   12/15/22 2200 12/16/22 0355 12/17/22 0500  Weight: 90.2 kg 90.2 kg 90.2 kg   Examination: General resting comfortably no distress HENT NCAT no JVD MMM Pulm clear. Dec bases on room air  Card rrr Abd soft  Ext warm good pulses   Resolved events    Assessment & Plan:   OHCA-initial shockable rhythm  CHB CAD, prior STEMI, HLD Left heart cath showed mod disease w/ CTO OM w/ R->L collaterals  Plan Cont tele  Medical rx w/ asa and zetia  BB to start after pacemaker placed Planning for device insertion  DD ICD  by EP today  Post arrest encephalopathy-has made progress and hopefully memory will improve Plan Cont supportive  care Mobilize  PT/OT  GERD Plan PPI  OSA,  Plan  CPAP at HS  Hypothyroidism Plan Synthroid   GB issues Plan Monitor   Best Practice (right click and "Reselect all SmartList Selections" daily)   Diet/type: -regular diet DVT prophylaxis: heparin 3 times daily GI prophylaxis: Not applicable Lines: Arterial line removal ordered Foley: Foley catheter removal ordered Code Status:  full code Last date of multidisciplinary goals of care discussion [Sister has been updated by phone.]  Move out of ICU once post-pacemaker and stable   Erick Colace ACNP-BC [place] Pager # [phone] OR #  [phone] if no answer

## 2022-12-20 NOTE — Interval H&P Note (Signed)
History and Physical Interval Note:  12/20/2022 11:57 AM  Matthew Pratt  has presented today for surgery, with the diagnosis of cardiac arrest.  The various methods of treatment have been discussed with the patient and family. After consideration of risks, benefits and other options for treatment, the patient has consented to  Procedure(s): ICD IMPLANT (N/A) as a surgical intervention.  The patient's history has been reviewed, patient examined, no change in status, stable for surgery.  I have reviewed the patient's chart and labs.  Questions were answered to the patient's satisfaction.     Cristopher Peru

## 2022-12-20 NOTE — NC FL2 (Signed)
[place] LEVEL OF CARE FORM     IDENTIFICATION  Patient Name: Matthew Pratt Birthdate: 07-Nov-1951 Sex: male Admission Date (Current Location): 12/15/2022  [facility] and [place] Number:  Herbalist and Address:  [facility], [place] [address] [place], [place] [address]      Provider Number: [id number]  Attending Physician Name and Address:  Jacky Kindle, MD  Relative Name and Phone Number:  [name] (Sister) [phone] Yvone Neu [phone]    Current Level of Care: Hospital Recommended Level of Care: [place] Prior Approval Number:    Date Approved/Denied:   PASRR Number:    Discharge Plan: SNF    Current Diagnoses: Patient Active Problem List   Diagnosis Date Noted   CHB (complete heart block) ([place]) 12/20/2022   Cardiac arrest ([place]) 12/15/2022   CAD S/P percutaneous coronary angioplasty 12/15/2022   Chronic insomnia 09/30/2021   Statin myopathy 03/25/2021   S/P laparoscopic cholecystectomy 02/10/2021   Laryngitis from reflux of stomach acid 11/11/2020   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 10/28/2018   Irritable bowel syndrome with diarrhea 10/28/2018   Hypothyroid 07/26/2018   GERD (gastroesophageal reflux disease) 07/26/2018   Barrett's esophagus 07/26/2018   Rosacea 07/26/2018   Medicare annual wellness visit, initial 02/24/2018   Dupuytren's contracture of hand 06/23/2017   H/O acute myocardial infarction 06/16/2017   Fatigue 06/16/2017   Benign prostatic hyperplasia with urinary hesitancy 06/16/2017    OSA (obstructive sleep apnea) 12/03/2016   Depression, recurrent ([place]) 12/03/2016   Heartburn 10/15/2016   Moderate episode of recurrent major depressive disorder ([place]) 07/22/2016   Atypical chest pain 12/26/2015   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   Erectile dysfunction due to arterial insufficiency 03/26/2015   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   Hyperlipidemia 10/26/2010   DEPRESSION/ANXIETY 10/26/2010   GLAUCOMA 10/26/2010   MYOCARDIAL INFARCTION, ACUTE, INFEROLATERAL 10/26/2010   CAD, NATIVE VESSEL 10/26/2010   SINUS BRADYCARDIA 10/26/2010   Glaucoma 10/26/2010    Orientation RESPIRATION BLADDER Height & Weight     Self, Place  O2 (Nasal Cannula 2 liters) Continent Weight: 185 lb 3 oz (84 kg) Height:  '6\' 1"'$  (185.4 cm)  BEHAVIORAL SYMPTOMS/MOOD NEUROLOGICAL BOWEL NUTRITION STATUS      Continent (WDL) Diet (Please see discharge summary)  AMBULATORY STATUS COMMUNICATION OF NEEDS Skin   Limited Assist Verbally Other (Comment) (dry,Abrasion,arm,head,R,Erythema,arm,back,bilateral,Wound/Incision LDAs)                       Personal Care Assistance Level of Assistance  Bathing, Feeding, Dressing Bathing Assistance: Limited assistance   Dressing Assistance: Limited assistance     Functional Limitations Info  Sight, Hearing, Speech Sight Info: Impaired Hearing Info: Adequate (WDL) Speech Info: Adequate    SPECIAL CARE FACTORS FREQUENCY  PT (By licensed PT), OT (By licensed OT)     PT Frequency: 5x min weekly OT Frequency: 5x min weekly            Contractures Contractures Info: Not present    Additional Factors Info  Code Status, Allergies, Psychotropic Code Status Info: FULL Allergies Info: Bee  Venom,Crestor (rosuvastatin),Iodinated Contrast Media,Sulfa Antibiotics,Sulfonamide Derivatives Psychotropic Info: QUEtiapine (SEROQUEL) tablet 25 mg daily at bedtime,venlafaxine XR (EFFEXOR-XR) 24 hr capsule 112.5 mg daily         Current Medications (12/20/2022):   This is the current hospital active medication list Current Facility-Administered Medications  Medication Dose Route Frequency Provider Last Rate Last Admin   0.9 %  sodium chloride infusion   Intravenous Continuous Sande Rives E, PA-C 50 mL/hr at 12/20/22 0600 Infusion Verify at 12/20/22 0600   0.9 %  sodium chloride infusion   Intravenous Continuous Shirley Friar, PA-C       0.9 %  sodium chloride infusion   Intravenous Continuous Shirley Friar, PA-C       0.9 %  sodium chloride infusion    Continuous PRN Evans Lance, MD 999 mL/hr at 12/20/22 1540 250 mL at 12/20/22 1540   [MAR Hold] acetaminophen (TYLENOL) tablet 650 mg  650 mg Oral Q6H PRN Kipp Brood, MD   650 mg at 12/20/22 0243   [MAR Hold] aspirin EC tablet 81 mg  81 mg Oral Daily Josue Hector, MD   81 mg at 12/20/22 1102   chlorhexidine (HIBICLENS) 4 % liquid 4 Application  60 mL Topical Once Shirley Friar, PA-C       chlorhexidine (HIBICLENS) 4 % liquid 4 Application  60 mL Topical Once Shirley Friar, PA-C       [facility] Hold] Chlorhexidine Gluconate Cloth 2 % PADS 6 each  6 each Topical Q0600 Jacky Kindle, MD   6 each at 12/20/22 0550   [MAR Hold] ezetimibe (ZETIA) tablet 10 mg  10 mg Oral Daily Josue Hector, MD   10 mg at 12/20/22 1102   fentaNYL (SUBLIMAZE) injection    PRN Evans Lance, MD   12.5 mcg at 12/20/22 1545   [MAR Hold] haloperidol lactate (HALDOL) injection 1 mg  1 mg Intravenous Q3H PRN Anders Simmonds, MD   1 mg at 12/17/22 1832   Heparin (Porcine) in NaCl 1000-0.9 UT/500ML-% SOLN    PRN Evans Lance, MD   500 mL at 12/20/22 1544   [MAR Hold] ibuprofen (ADVIL) tablet 400 mg  400 mg Oral Q6H PRN Kipp Brood, MD       [MAR Hold] levothyroxine (SYNTHROID) tablet 125 mcg  125 mcg Oral Daily Kipp Brood, MD   125 mcg at 12/20/22 0551   [MAR Hold] lidocaine (LIDODERM) 5 % 1 patch  1 patch Transdermal Q24H Kipp Brood, MD   1 patch at 12/19/22 1639    lidocaine (PF) (XYLOCAINE) 1 % injection    PRN Evans Lance, MD   60 mL at 12/20/22 1544   midazolam (VERSED) 5 MG/5ML injection    PRN Evans Lance, MD   1 mg at 12/20/22 1545   [MAR Hold] nitroGLYCERIN (NITROSTAT) SL tablet 0.4 mg  0.4 mg Sublingual Q5 min PRN Agarwala, Einar Grad, MD   0.4 mg at 12/18/22 2230   [MAR Hold] ondansetron (ZOFRAN) injection 4 mg  4 mg Intravenous Q6H PRN Leonie Man, MD   4 mg at 12/17/22 1215   [MAR Hold] pantoprazole (PROTONIX) EC tablet 40 mg  40 mg Oral Daily Agarwala, Einar Grad, MD   40 mg at 12/20/22 1102   [MAR Hold] QUEtiapine (SEROQUEL) tablet 25 mg  25 mg Oral QHS Kipp Brood, MD   25 mg at 12/19/22 2106   [facility] Hold] sodium chloride flush (NS) 0.9 % injection 3 mL  3 mL Intravenous Q12H Leonie Man, MD   3 mL at 12/20/22 1000   [MAR Hold] sodium chloride flush (NS) 0.9 % injection 3 mL  3 mL Intravenous Q12H Leonie Man, MD   3 mL at 12/20/22 1000   [MAR Hold] sodium chloride flush (NS) 0.9 % injection 3 mL  3 mL Intravenous PRN Leonie Man, MD       [MAR Hold] traZODone (DESYREL) tablet 25-50 mg  25-50 mg Oral QHS PRN Kipp Brood, MD       [facility] Hold] venlafaxine XR (EFFEXOR-XR) 24 hr capsule 112.5 mg  112.5 mg Oral Daily Kipp Brood, MD   112.5 mg at 12/20/22 1107     Discharge Medications: Please see discharge summary for a list of discharge medications.  Relevant Imaging Results:  Relevant Lab Results:   Additional Information [phone]  Milas Gain, LCSWA

## 2022-12-20 NOTE — Progress Notes (Signed)
Inpatient Rehab Admissions Coordinator:    I spoke with Pt. Regarding potential CIR admit. He states that he does not have support at home and does not want to ask his sister to care for him, as she cares for her husband already. Pt. States that he would prefer to go SNF and potentially transition to long term care if needed. CIR will sign off.   Clemens Catholic, [place], [place] Admissions Coordinator  [phone] ([place]) [phone] (office)

## 2022-12-20 NOTE — Discharge Instructions (Signed)
After Your ICD (Implantable Cardiac Defibrillator)  ACTIVITY Do not lift your arm above shoulder height for 1 week after your procedure. After 7 days, you may progress as below.  You should remove your sling 24 hours after your procedure, unless otherwise instructed by your provider.     Monday December 27, 2022  Tuesday December 28, 2022 Wednesday December 29, 2022 Thursday December 30, 2022   Do not lift, push, pull, or carry anything over 10 pounds with the affected arm until 6 weeks (Monday January 31, 2023 ) after your procedure.   You may drive AFTER your wound check, unless you have been told otherwise by your provider.   Ask your healthcare provider when you can go back to work   INCISION/Dressing If you are on a blood thinner such as Coumadin, Xarelto, Eliquis, Plavix, or Pradaxa please confirm with your provider when this should be resumed.   If large square, outer bandage is left in place, this can be removed after 24 hours from your procedure. Do not remove steri-strips or glue as below.   Monitor your defibrillator site for redness, swelling, and drainage. Call the device clinic at [phone] if you experience these symptoms or fever/chills.  If your incision is sealed with Steri-strips or staples, you may shower 7 days after your procedure or when told by your provider. Do not remove the steri-strips or let the shower hit directly on your site. You may wash around your site with soap and water.    If you were discharged in a sling, please do not wear this during the day more than 48 hours after your surgery unless otherwise instructed. This may increase the risk of stiffness and soreness in your shoulder.   Avoid lotions, ointments, or perfumes over your incision until it is well-healed.  You may use a hot tub or a pool AFTER your wound check appointment if the incision is completely closed.  Your ICD is designed to protect you from life threatening heart rhythms. Because  of this, you may receive a shock.   1 shock with no symptoms:  Call the office during business hours. 1 shock with symptoms (chest pain, chest pressure, dizziness, lightheadedness, shortness of breath, overall feeling unwell):  Call 911. If you experience 2 or more shocks in 24 hours:  Call 911. If you receive a shock, you should not drive for 6 months per the [place] DMV IF you receive appropriate therapy from your ICD.   ICD Alerts:  Some alerts are vibratory and others beep. These are NOT emergencies. Please call our office to let us know. If this occurs at night or on weekends, it can wait until the next business day. Send a remote transmission.  If your device is capable of reading fluid status (for heart failure), you will be offered monthly monitoring to review this with you.   DEVICE MANAGEMENT Remote monitoring is used to monitor your ICD from home. This monitoring is scheduled every 91 days by our office. It allows [place] to keep an eye on the functioning of your device to ensure it is working properly. You will routinely see your Electrophysiologist annually (more often if necessary).   You should receive your ID card for your new device in 4-8 weeks. Keep this card with you at all times once received. Consider wearing a medical alert bracelet or necklace.  Your ICD  may be MRI compatible. This will be discussed at your next office visit/wound check.  You should avoid  contact with strong electric or magnetic fields.   Do not use amateur (ham) radio equipment or electric (arc) welding torches. MP3 player headphones with magnets should not be used. Some devices are safe to use if held at least 12 inches (30 cm) from your defibrillator. These include power tools, lawn mowers, and speakers. If you are unsure if something is safe to use, ask your health care provider.  When using your cell phone, hold it to the ear that is on the opposite side from the defibrillator. Do not leave your cell phone in  a pocket over the defibrillator.  You may safely use electric blankets, heating pads, computers, and microwave ovens.  Call the office right away if: You have chest pain. You feel more than one shock. You feel more short of breath than you have felt before. You feel more light-headed than you have felt before. Your incision starts to open up.  This information is not intended to replace advice given to you by your health care provider. Make sure you discuss any questions you have with your health care provider.

## 2022-12-21 ENCOUNTER — Other Ambulatory Visit (HOSPITAL_COMMUNITY): Payer: Self-pay

## 2022-12-21 ENCOUNTER — Encounter (HOSPITAL_COMMUNITY): Payer: Self-pay | Admitting: Internal Medicine

## 2022-12-21 DIAGNOSIS — I469 Cardiac arrest, cause unspecified: Secondary | ICD-10-CM | POA: Diagnosis not present

## 2022-12-21 LAB — BASIC METABOLIC PANEL
Anion gap: 8 (ref 5–15)
BUN: 20 mg/dL (ref 8–23)
CO2: 25 mmol/L (ref 22–32)
Calcium: 9.1 mg/dL (ref 8.9–10.3)
Chloride: 103 mmol/L (ref 98–111)
Creatinine, Ser: 1.1 mg/dL (ref 0.61–1.24)
GFR, Estimated: >60 mL/min (ref >60)
Glucose, Bld: 160 mg/dL — ABNORMAL HIGH (ref 70–99)
Potassium: 4.2 mmol/L (ref 3.5–5.1)
Sodium: 136 mmol/L (ref 135–145)

## 2022-12-21 LAB — CBC
HCT: 39.1 % (ref 39.0–52.0)
Hemoglobin: 13.9 g/dL (ref 13.0–17.0)
MCH: 35.5 pg — ABNORMAL HIGH (ref 26.0–34.0)
MCHC: 35.5 g/dL (ref 30.0–36.0)
MCV: 99.7 fL (ref 80.0–100.0)
Platelets: 152 10*3/uL (ref 150–400)
RBC: 3.92 MIL/uL — ABNORMAL LOW (ref 4.22–5.81)
RDW: 11.9 % (ref 11.5–15.5)
WBC: 9.1 10*3/uL (ref 4.0–10.5)
nRBC: 0 % (ref 0.0–0.2)

## 2022-12-21 MED ORDER — METOPROLOL SUCCINATE ER 25 MG PO TB24
25.0000 mg | ORAL_TABLET | Freq: Every day | ORAL | 0 refills | Status: DC
  Filled 2022-12-21: qty 30, 30d supply, fill #0

## 2022-12-21 MED ORDER — CEFAZOLIN SODIUM-DEXTROSE 1-4 GM/50ML-% IV SOLN
1.0000 g | Freq: Three times a day (TID) | INTRAVENOUS | Status: AC
  Administered 2022-12-21 (×2): 1 g via INTRAVENOUS
  Filled 2022-12-21 (×2): qty 50

## 2022-12-21 MED ORDER — METOPROLOL SUCCINATE ER 25 MG PO TB24
25.0000 mg | ORAL_TABLET | Freq: Every day | ORAL | Status: DC
  Administered 2022-12-21 – 2022-12-22 (×2): 25 mg via ORAL
  Filled 2022-12-21 (×2): qty 1

## 2022-12-21 NOTE — Discharge Summary (Signed)
Physician Discharge Summary   Patient: Matthew Pratt MRN: [id number] DOB: 01/19/1951  Admit date:     12/15/2022  Discharge date: 12/21/22  Discharge Physician: Patrecia Pour   PCP: [name], MD   Recommendations at discharge:  Follow up with cardiology and EP after discharge. Started metoprolol s/p Abbott DDD ICD 1/15 by Dr. Lovena Le.  Suggest referral to lipid clinic for nonstatin cholesterol management Follow up with PCP in 1-2 weeks. Home health arranged at discharge as patient and family decided against SNF rehabilitation due to patient's progressive improvement in mentation and mobility.  Discharge Diagnoses: Principal Problem:   Cardiac arrest [name]) Active Problems:   Hyperlipidemia   OSA (obstructive sleep apnea)   Hypothyroid   CAD S/P percutaneous coronary angioplasty   CHB (complete heart block) [name])   Hospital Course: Matthew Pratt is a 72 year old male with CAD, OSA, HLD with statin intolerance who was admitted with witnessed out of hospital cardiac arrest with shockable rhythm, ROSC was achieved after 9 minutes admitted to ICU 1/10, successfully extubated 1/11. Had a prolonged period of ventricular standstill and complete heart block with dizziness while sitting up. Work up included: No PE on CTA. Creatinine improving to 0.85. Initial troponin negative rose to 910, not felt to be primary event. LVEF preserved at 55%. Cardiac catheterization revealed diffuse moderate-severe disease without obvious culprit lesion to explain cardiac arrest. EP ultimately placed DDD ICD 1/15.   Assessment and Plan: Cardiac arrest: VF noted by EMS.  Etiology not identified.  Stable CAD and normal LVEF this admission.  Now s/p St. Jude dual chamber ICD.  # Intermittent AV block:  Brief CHB when arterial line was pulled.  S/p ICD as above.   Anoxic brain injury: This has steadily improved to the point that the patient, medical team, and family feel the patient is safe  returning to home environment. He is ambulating several times around the unit and remains oriented asking appropriate questions.     CAD:  Hyperlipidemia: Cath showed moderate disease with CTO OM with R-->L collaterals.  Continue medical management.  Continue aspirin and Zetia.  Lipids not well-controlled.  Consider non-statin alternatives as an outpatient.  Beta blocker started. No further room in BP for additional agents.     AKI: Resolved.  Consultants: Cardiology, EP, PCCM Procedures performed: Echo, LHC, ICD implant  Disposition: Home Diet recommendation:  Discharge Diet Orders (From admission, onward)     Start     Ordered   12/21/22 0000  Diet - low sodium heart healthy        12/21/22 1523           DISCHARGE MEDICATION: Allergies as of 12/21/2022       Reactions   Bee Venom Hives, Swelling   Crestor [rosuvastatin] Other (See Comments)   Myalgia.   Iodinated Contrast Media Hives, Itching   Sulfa Antibiotics Rash   Sulfonamide Derivatives Rash        Medication List     TAKE these medications    acetaminophen 500 MG tablet Commonly known as: TYLENOL Take 500 mg by mouth every 6 (six) hours as needed for mild pain.   aspirin EC 81 MG tablet Take 81 mg by mouth every morning.   ezetimibe 10 MG tablet Commonly known as: ZETIA Take 10 mg by mouth daily.   metoprolol succinate 25 MG 24 hr tablet Commonly known as: TOPROL-XL Take 1 tablet (25 mg total) by mouth daily. Start taking on: December 22, 2022   nitroGLYCERIN 0.4 MG SL tablet Commonly known as: NITROSTAT Place 1 tablet (0.4 mg total) under the tongue every 5 (five) minutes as needed for chest pain.   Selenium 100 MCG Tabs Take 100 mcg by mouth daily.   senna 8.6 MG tablet Commonly known as: SENOKOT Take 1 tablet by mouth as needed for constipation.   SUPER ENZYMES PO Take 1 capsule by mouth daily.   Synthroid 125 MCG tablet Generic drug: levothyroxine TAKE 1 TABLET BY MOUTH EVERY  DAY   timolol 0.5 % ophthalmic solution Commonly known as: TIMOPTIC Place 1 drop into both eyes 2 (two) times daily.   traZODone 50 MG tablet Commonly known as: DESYREL TAKE 1/2 TO 1 TABLET BY MOUTH AT NIGHT AS NEEDED FOR INSOMNIA What changed: See the new instructions.   TURMERIC PO Take 1,500 mg by mouth daily.   venlafaxine XR 37.5 MG 24 hr capsule Commonly known as: EFFEXOR-XR TAKE 3 CAPSULES BY MOUTH EVERY DAY   zinc gluconate 50 MG tablet Take 50 mg by mouth daily at 6 PM.        Follow-up Information     Lenna Sciara, NP Follow up on 01/06/2023.   Specialties: Cardiology, Family Medicine Why: at 10:55 AM please arrive 15 min early to check in this is Dr. Delphina Cahill Nurse Practitioner Contact information: [place] [address] [phone]         Baldwin Jamaica, PA-C Follow up on 01/04/2023.   Specialty: Cardiology Why: at 2:45 AM for EP appt Contact information: [address] [place] [address] [phone]                Discharge Exam: Matthew Pratt Weights   12/16/22 0355 12/17/22 0500 12/20/22 0500  Weight: 90.2 kg 90.2 kg 84 kg  BP 123/79   Pulse 64   Temp 97.9 F (36.6 C) (Oral)   Resp 16   Ht '6\' 1"'$  (1.854 m)   Wt 84 kg   SpO2 95%   BMI 24.43 kg/m   Well-appearing male in no distress stating he'd like to go home, though if SNF is recommended, he understands that too. LAter in the day cardiology and EP teams have recommended discharge home and TOC confirmed with family they feel he can be supported adequately there.  RRR, no MRG or pitting edema Clear, nonlabored L upper chest site dressing c/d/i  Condition at discharge: stable  The results of significant diagnostics from this hospitalization (including imaging, microbiology, ancillary and laboratory) are listed below for reference.   Imaging Studies: DG Chest Port 1 View  Result Date: 12/20/2022 CLINICAL DATA:  Defibrillator placement  EXAM: PORTABLE CHEST 1 VIEW COMPARISON:  12/15/2012 FINDINGS: Endotracheal tube and gastric catheter have been removed. New defibrillator is noted in satisfactory position. No pneumothorax is noted. Mild left basilar atelectasis is seen. No bony abnormality is noted. IMPRESSION: No pneumothorax following defibrillator placement Electronically Signed   By: Inez Catalina M.D.   On: 12/20/2022 21:18   EP PPM/ICD IMPLANT  Result Date: 12/20/2022 CONCLUSIONS:  1. 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  2. Successful ICD implantation.  3. No early apparent complications. ICD Criteria Current LVEF:50%%. Within 12 months prior to implant: Yes Heart failure history: Yes, Class II Cardiomyopathy history: Yes, Ischemic Cardiomyopathy - Prior MI. Atrial Fibrillation/Atrial Flutter: Yes, Paroxysmal. Ventricular tachycardia history: No. Cardiac arrest history: Yes, Ventricular Fibrillation. History of  syndromes with risk of sudden death: No. Previous ICD: No. Current ICD indication: Secondary PPM indication: No.  Class I or II Bradycardia indication present: No Beta Blocker therapy for 3 or more months: yes. Ace Inhibitor/ARB therapy for 3 or more months: No, medical reason. Matthew Peru, MD 4:28 PM 12/20/2022   EEG adult  Result Date: 12/16/2022 Lora Havens, MD     12/16/2022  2:00 PM Patient Name: Matthew Pratt MRN: [id number] Epilepsy Attending: Lora Havens Referring Physician/Provider: Candee Furbish, MD Date: 12/16/2022 Duration: 22.20 mins Patient history: 72 year old male status post cardiac arrest.  EEG to evaluate for seizure. Level of alertness: comatose AEDs during EEG study: Versed, propofol Technical aspects: This EEG study was done with scalp electrodes positioned according to the 10-20 International system of electrode placement. Electrical activity was reviewed with band pass filter of 1-'70Hz'$ , sensitivity of 7 uV/mm, display speed  of 23m/sec with a '60Hz'$  notched filter applied as appropriate. EEG data were recorded continuously and digitally stored.  Video monitoring was available and reviewed as appropriate. Description: EEG showed continuous generalized predominantly 5 to 8 Hz theta slowing admixed with intermittent generalized 2 to 3 Hz delta slowing. Hyperventilation and photic stimulation were not performed.   ABNORMALITY - Continuous slow, generalized IMPRESSION: This study is suggestive of severe diffuse encephalopathy, nonspecific etiology. No seizures or epileptiform discharges were seen throughout the recording. PBridgeportCATHETERIZATION  Result Date: 12/15/2022   Prox LAD to Mid LAD lesion is 55% stenosed.  Dist LAD lesion is 45% stenosed.   Prox Cx to Mid Cx lesion is 55% stenosed.   1st Mrg (previously placed Drug Eluting stent is 100% stenosed -> CTO with left to left and right left lateral   Prox RCA lesion is 60% stenosed. Mid RCA lesion is 55% stenosed.   LV end diastolic pressure is low.   There is no aortic valve stenosis. POST-CATH DIAGNOSES Diffuse moderate to severe disease with calcification in all the proximal vessels, and likely CTO of previous OM1 stent filling via collaterals from both left to right and right to left. No obvious culprit lesion to explain cardiac arrest. Low LVEDP of 6 mmHg. A-line pressures were in the high 80s to low 90s compared to cuff . Hypokalemia noted on labs-potassium 2.9.  Was started on IV potassium 10 mg once RECOMMENDATION Monitor clinically for signs of recovery per PCCM. I suspect regional wall motion abnormality on echo was related to the CTO of the OM. DGlenetta Hew MD  CT Angio Chest/Abd/Pel for Dissection W and/or Wo Contrast  Result Date: 12/15/2022 CLINICAL DATA:  Status post cardiac arrest, acute aortic syndrome suspected EXAM: CT ANGIOGRAPHY CHEST, ABDOMEN AND PELVIS TECHNIQUE: Non-contrast CT of the chest was initially obtained. Multidetector CT  imaging through the chest, abdomen and pelvis was performed using the standard protocol during bolus administration of intravenous contrast. Multiplanar reconstructed images and MIPs were obtained and reviewed to evaluate the vascular anatomy. RADIATION DOSE REDUCTION: This exam was performed according to the departmental dose-optimization program which includes automated exposure control, adjustment of the mA and/or kV according to patient size and/or use of iterative reconstruction technique. CONTRAST:  724mOMNIPAQUE IOHEXOL 350 MG/ML SOLN COMPARISON:  None Available. FINDINGS: CTA CHEST FINDINGS Cardiovascular: There is no demonstrable mural hematoma in the noncontrast images of thoracic aorta. There is homogeneous enhancement in thoracic aorta. There is no demonstrable intimal flap. Atherosclerotic plaques and calcifications are seen in aortic arch. There is no  extravasation of contrast. Major branches of thoracic aorta appear patent. There are no filling defects in central pulmonary artery branches. Coronary artery calcifications are seen. Mediastinum/Nodes: There is no mediastinal hematoma. There is no significant lymphadenopathy. Lungs/Pleura: Small linear infiltrates are seen in the posterior aspects of both lungs. There is no focal consolidation. There are no signs of alveolar pulmonary edema. There is no pleural effusion or pneumothorax. Small pleural plaques are seen in posterior aspects of both apices. Musculoskeletal: There is 20-30% decrease in height of body of T12 vertebra. Schmorl's nodes are seen in endplates of D17 vertebral body, more prominent in the upper endplate. Alignment of posterior margins of vertebral bodies appears normal. Review of the MIP images confirms the above findings. CTA ABDOMEN AND PELVIS FINDINGS VASCULAR Aorta: There is no evidence of dissection or focal aneurysmal dilation. There are scattered calcifications and atherosclerotic plaques in aorta and its major branches.  Celiac: There are scattered calcifications without significant stenosis SMA: There are small plaques and scattered calcifications without significant stenosis. Renals: There are calcifications in the proximal course without significant stenosis. IMA: Patent. Iliacs: There are scattered calcifications and atherosclerotic plaques, more prominent at the bifurcation of right common iliac without significant stenosis. Veins: Unremarkable. Review of the MIP images confirms the above findings. NON-VASCULAR Hepatobiliary: No focal abnormalities are seen in liver. Surgical clips are seen in gallbladder fossa. There is prominence of extrahepatic bile ducts with distal common bile duct in the head of the pancreas measuring 1.3 cm. There is no significant dilation of intrahepatic bile ducts. Pancreas: No focal abnormalities are seen. Spleen: Unremarkable. Adrenals/Urinary Tract: Adrenals are unremarkable. There is contrast in the pelvocaliceal systems limiting evaluation for nonobstructing stones. There is no hydronephrosis. Possible subcentimeter cyst is seen in the lower pole of right kidney. Ureters are not dilated. There is contrast in the lumen of bladder. Foley catheter is present. Pockets of air in the bladder may be due to recent catheterization. There is diffuse wall thickening in the bladder. Bladder is not distended. Stomach/Bowel: Stomach is moderately distended. Tip of enteric tube is seen in the distal antrum of the stomach. There is fluid in the lumen of slightly dilated distal small bowel loops. Appendix is not dilated. Liquid stool is seen in colon. There is no significant wall thickening in colon. There is no pericolic stranding. Lymphatic: No significant lymphadenopathy is seen. Reproductive: Unremarkable. Other: Note is no ascites or pneumoperitoneum. There is 4.6 cm fluid density structure immediately behind the anterior abdominal wall close to the right inguinal region. This most likely is a device used  for management of erectile dysfunction. Musculoskeletal: Decrease in height of body of T12 vertebra along with Schmorl's nodes may be related to previous injury. As far as seen, no recent fractures are seen in bony structures. Review of the MIP images confirms the above findings. IMPRESSION: There is no evidence of dissection in thoracic and abdominal aorta. Major branches of thoracic and abdominal aorta are patent. Coronary artery disease. Aortic atherosclerosis. There is no evidence of central pulmonary artery embolism. Small linear patchy infiltrates are seen in the posterior aspects of both lungs suggesting subsegmental atelectasis. There is no evidence of intestinal obstruction or pneumoperitoneum. Appendix is not dilated. There is no hydronephrosis. There is fluid in the lumen of slightly dilated distal small bowel loops and in the colon suggesting possible nonspecific enteritis. There is wall thickening in the urinary bladder which may be due to incomplete distention or suggest cystitis. Prominence of extrahepatic bile ducts  may be related to previous cholecystectomy. Other findings as described in the body of the report. Electronically Signed   By: Elmer Picker M.D.   On: 12/15/2022 16:56   CT HEAD WO CONTRAST  Result Date: 12/15/2022 CLINICAL DATA:  Provided history: Mental status change, unknown cause. Neck trauma. Additional history provided: Witnessed arrest, status post resuscitation. EXAM: CT HEAD WITHOUT CONTRAST CT CERVICAL SPINE WITHOUT CONTRAST TECHNIQUE: Multidetector CT imaging of the head and cervical spine was performed following the standard protocol without intravenous contrast. Multiplanar CT image reconstructions of the cervical spine were also generated. RADIATION DOSE REDUCTION: This exam was performed according to the departmental dose-optimization program which includes automated exposure control, adjustment of the mA and/or kV according to patient size and/or use of  iterative reconstruction technique. COMPARISON:  Head CT 07/13/2021. Maxillofacial CT 07/13/2021. FINDINGS: CT HEAD FINDINGS Brain: There is no acute intracranial hemorrhage. No demarcated cortical infarct. No extra-axial fluid collection. No evidence of an intracranial mass. No midline shift. Vascular: No hyperdense vessel. Atherosclerotic calcifications. Skull: No fracture or aggressive osseous lesion. Sinuses/Orbits: No orbital mass or acute orbital finding. Small mucous retention cyst within the right maxillary sinus. CT CERVICAL SPINE FINDINGS Alignment: Slight grade 1 anterolisthesis at C2-C3. Slight grade 1 retrolisthesis at C3-C4. Slight grade 1 anterolisthesis at C7-T1. Skull base and vertebrae: The basion-dental and atlanto-dental intervals are maintained.No evidence of acute fracture to the cervical spine. Soft tissues and spinal canal: No prevertebral fluid or swelling. No visible canal hematoma. Disc levels: Cervical spondylosis with multilevel disc space narrowing, disc bulges, endplate spurring and uncovertebral hypertrophy. No appreciable high-grade spinal canal stenosis. Multilevel bony neural foraminal narrowing. Upper chest: No consolidation within the imaged lung apices. No visible pneumothorax. Other: The ET tube terminates at the level of the clavicular heads. Partially imaged enteric tube. Partially imaged central venous catheter from a left-sided approach. IMPRESSION: CT head: 1. No evidence of acute intracranial abnormality. 2. Please note, a brain MRI would have greater sensitivity for acute hypoxic/ischemic injury. CT cervical spine: 1. No evidence of acute fracture to the cervical spine. 2. Mild grade 1 spondylolisthesis at C2-C3, C3-C4 and C7-T1. 3. Cervical spondylosis, as described. Electronically Signed   By: Kellie Simmering D.O.   On: 12/15/2022 16:44   CT Cervical Spine Wo Contrast  Result Date: 12/15/2022 CLINICAL DATA:  Provided history: Mental status change, unknown cause. Neck  trauma. Additional history provided: Witnessed arrest, status post resuscitation. EXAM: CT HEAD WITHOUT CONTRAST CT CERVICAL SPINE WITHOUT CONTRAST TECHNIQUE: Multidetector CT imaging of the head and cervical spine was performed following the standard protocol without intravenous contrast. Multiplanar CT image reconstructions of the cervical spine were also generated. RADIATION DOSE REDUCTION: This exam was performed according to the departmental dose-optimization program which includes automated exposure control, adjustment of the mA and/or kV according to patient size and/or use of iterative reconstruction technique. COMPARISON:  Head CT 07/13/2021. Maxillofacial CT 07/13/2021. FINDINGS: CT HEAD FINDINGS Brain: There is no acute intracranial hemorrhage. No demarcated cortical infarct. No extra-axial fluid collection. No evidence of an intracranial mass. No midline shift. Vascular: No hyperdense vessel. Atherosclerotic calcifications. Skull: No fracture or aggressive osseous lesion. Sinuses/Orbits: No orbital mass or acute orbital finding. Small mucous retention cyst within the right maxillary sinus. CT CERVICAL SPINE FINDINGS Alignment: Slight grade 1 anterolisthesis at C2-C3. Slight grade 1 retrolisthesis at C3-C4. Slight grade 1 anterolisthesis at C7-T1. Skull base and vertebrae: The basion-dental and atlanto-dental intervals are maintained.No evidence of acute fracture to the cervical  spine. Soft tissues and spinal canal: No prevertebral fluid or swelling. No visible canal hematoma. Disc levels: Cervical spondylosis with multilevel disc space narrowing, disc bulges, endplate spurring and uncovertebral hypertrophy. No appreciable high-grade spinal canal stenosis. Multilevel bony neural foraminal narrowing. Upper chest: No consolidation within the imaged lung apices. No visible pneumothorax. Other: The ET tube terminates at the level of the clavicular heads. Partially imaged enteric tube. Partially imaged central  venous catheter from a left-sided approach. IMPRESSION: CT head: 1. No evidence of acute intracranial abnormality. 2. Please note, a brain MRI would have greater sensitivity for acute hypoxic/ischemic injury. CT cervical spine: 1. No evidence of acute fracture to the cervical spine. 2. Mild grade 1 spondylolisthesis at C2-C3, C3-C4 and C7-T1. 3. Cervical spondylosis, as described. Electronically Signed   By: Kellie Simmering D.O.   On: 12/15/2022 16:44   ECHOCARDIOGRAM COMPLETE  Result Date: 12/15/2022    ECHOCARDIOGRAM REPORT   Patient Name:   Matthew Pratt Date of Exam: 12/15/2022 Medical Rec #:  [id number]          Height:       73.0 in Accession #:    [id number]         Weight:       190.0 lb Date of Birth:  04-10-1951           BSA:          2.105 m Patient Age:    83 years           BP:           81/58 mmHg Patient Gender: M                  HR:           79 bpm. Exam Location:  Inpatient Procedure: 2D Echo, Cardiac Doppler and Color Doppler STAT ECHO Indications:    Congestive Heart Failure I50.9  History:        Patient has prior history of Echocardiogram examinations, most                 recent 01/20/2021. Previous Myocardial Infarction and CAD; Risk                 Factors:Dyslipidemia and GERD.  Sonographer:    Bernadene Person RDCS Referring Phys: [id number] [place]  1. Left ventricular ejection fraction, by estimation, is 55%. The left ventricle has low normal function. The left ventricle demonstrates regional wall motion abnormalities (see scoring diagram/findings for description). Left ventricular diastolic parameters were normal.  2. Right ventricular systolic function is mildly reduced. The right ventricular size is mildly enlarged. Tricuspid regurgitation signal is inadequate for assessing PA pressure.  3. Right atrial size was mildly dilated.  4. The mitral valve is grossly normal. Mild mitral valve regurgitation. No evidence of mitral stenosis.  5. The aortic valve is grossly normal.  There is mild thickening of the aortic valve. Aortic valve regurgitation is not visualized. No aortic stenosis is present.  6. The inferior vena cava is normal in size with greater than 50% respiratory variability, suggesting right atrial pressure of 3 mmHg. FINDINGS  Left Ventricle: Left ventricular ejection fraction, by estimation, is 55%. The left ventricle has low normal function. The left ventricle demonstrates regional wall motion abnormalities. The left ventricular internal cavity size was normal in size. There is no left ventricular hypertrophy. Left ventricular diastolic parameters were normal.  LV Wall Scoring: The posterior wall is hypokinetic. Right  Ventricle: The right ventricular size is mildly enlarged. No increase in right ventricular wall thickness. Right ventricular systolic function is mildly reduced. Tricuspid regurgitation signal is inadequate for assessing PA pressure. The tricuspid regurgitant velocity is 1.75 m/s, and with an assumed right atrial pressure of 3 mmHg, the estimated right ventricular systolic pressure is 82.4 mmHg. Left Atrium: Left atrial size was normal in size. Right Atrium: Right atrial size was mildly dilated. Pericardium: There is no evidence of pericardial effusion. Mitral Valve: The mitral valve is grossly normal. Mild mitral valve regurgitation. No evidence of mitral valve stenosis. Tricuspid Valve: The tricuspid valve is normal in structure. Tricuspid valve regurgitation is trivial. No evidence of tricuspid stenosis. Aortic Valve: The aortic valve is grossly normal. There is mild thickening of the aortic valve. Aortic valve regurgitation is not visualized. No aortic stenosis is present. Pulmonic Valve: The pulmonic valve was grossly normal. Pulmonic valve regurgitation is trivial. No evidence of pulmonic stenosis. Aorta: The aortic root is normal in size and structure. Ascending aorta measurements are within normal limits for age when indexed to body surface area.  Venous: The inferior vena cava is normal in size with greater than 50% respiratory variability, suggesting right atrial pressure of 3 mmHg. IAS/Shunts: No atrial level shunt detected by color flow Doppler.  LEFT VENTRICLE PLAX 2D LVIDd:         5.30 cm      Diastology LVIDs:         3.70 cm      LV e' medial:    9.03 cm/s LV PW:         0.80 cm      LV E/e' medial:  5.3 LV IVS:        0.90 cm      LV e' lateral:   12.10 cm/s LVOT diam:     2.10 cm      LV E/e' lateral: 4.0 LV SV:         54 LV SV Index:   26 LVOT Area:     3.46 cm  LV Volumes (MOD) LV vol d, MOD A2C: 124.0 ml LV vol d, MOD A4C: 134.0 ml LV vol s, MOD A2C: 49.2 ml LV vol s, MOD A4C: 50.0 ml LV SV MOD A2C:     74.8 ml LV SV MOD A4C:     134.0 ml LV SV MOD BP:      78.8 ml RIGHT VENTRICLE RV S prime:     12.10 cm/s TAPSE (M-mode): 2.5 cm LEFT ATRIUM           Index        RIGHT ATRIUM           Index LA diam:      4.00 cm 1.90 cm/m   RA Area:     24.90 cm LA Vol (A2C): 38.1 ml 18.10 ml/m  RA Volume:   73.50 ml  34.91 ml/m LA Vol (A4C): 46.1 ml 21.90 ml/m  AORTIC VALVE LVOT Vmax:   79.00 cm/s LVOT Vmean:  52.000 cm/s LVOT VTI:    0.155 m  AORTA Ao Root diam: 3.40 cm Ao Asc diam:  3.70 cm MITRAL VALVE               TRICUSPID VALVE MV Area (PHT): 3.42 cm    TR Peak grad:   12.2 mmHg MV Decel Time: 222 msec    TR Vmax:        175.00 cm/s MR Peak grad:  28.5 mmHg MR Mean grad: 23.0 mmHg    SHUNTS MR Vmax:      267.00 cm/s  Systemic VTI:  0.16 m MR Vmean:     235.0 cm/s   Systemic Diam: 2.10 cm MV E velocity: 47.90 cm/s MV A velocity: 49.90 cm/s MV E/A ratio:  0.96 Cherlynn Kaiser MD Electronically signed by Cherlynn Kaiser MD Signature Date/Time: 12/15/2022/2:37:17 PM    Final    DG Chest Port 1 View  Result Date: 12/15/2022 CLINICAL DATA:  ETT placement. EXAM: PORTABLE CHEST 1 VIEW COMPARISON:  Chest radiograph 05/05/2022 FINDINGS: The endotracheal tube tip is approximately 6.0 cm from the carina at the level of the clavicular heads. The enteric  catheter tip is in the stomach. The cardiomediastinal silhouette is normal. There is no focal consolidation or pulmonary edema. There is no pleural effusion or pneumothorax There is no acute osseous abnormality. IMPRESSION: 1. Endotracheal tube tip at the level of the clavicular heads approximately 6.0 cm from the carina. 2. No focal airspace disease or pleural effusion. Electronically Signed   By: Valetta Mole M.D.   On: 12/15/2022 13:23   DG Abd Portable 1V  Result Date: 12/15/2022 CLINICAL DATA:  Nasal/orogastric tube placement. EXAM: PORTABLE ABDOMEN - 1 VIEW COMPARISON:  CT, 06/27/2020.  Current chest radiograph. FINDINGS: Nasal/orogastric tube passes below the diaphragm, tip in the distal stomach. Normal bowel gas pattern. IMPRESSION: Well-positioned nasal/orogastric tube. Electronically Signed   By: Lajean Manes M.D.   On: 12/15/2022 13:22    Microbiology: Results for orders placed or performed during the hospital encounter of 12/15/22  Culture, blood (Routine X 2) w Reflex to ID Panel     Status: None   Collection Time: 12/15/22  9:02 PM   Specimen: BLOOD LEFT HAND  Result Value Ref Range Status   Specimen Description BLOOD LEFT HAND  Final   Special Requests IN PEDIATRIC BOTTLE Blood Culture adequate volume  Final   Culture   Final    NO GROWTH 5 DAYS Performed at [facility], [place] [address] [place], [place] [address]    Report Status 12/20/2022 FINAL  Final  Culture, blood (Routine X 2) w Reflex to ID Panel     Status: None   Collection Time: 12/15/22  9:05 PM   Specimen: BLOOD LEFT HAND  Result Value Ref Range Status   Specimen Description BLOOD LEFT HAND  Final   Special Requests IN PEDIATRIC BOTTLE Blood Culture adequate volume  Final   Culture   Final    NO GROWTH 5 DAYS Performed at [facility], [place] [address], [place], [place] [address]    Report Status 12/20/2022 FINAL  Final  Urine Culture     Status: None   Collection Time: 12/16/22 12:22 PM    Specimen: Urine, Catheterized  Result Value Ref Range Status   Specimen Description URINE, CATHETERIZED  Final   Special Requests NONE  Final   Culture   Final    NO GROWTH Performed at [facility], [place] [address], [place], [place] [address]    Report Status 12/17/2022 FINAL  Final  MRSA Next Gen by PCR, Nasal     Status: None   Collection Time: 12/16/22 12:22 PM   Specimen: Nasal Mucosa; Nasal Swab  Result Value Ref Range Status   MRSA by PCR Next Gen NOT DETECTED NOT DETECTED Final    Comment: (NOTE) The GeneXpert MRSA Assay (FDA approved for NASAL specimens only), is one component  of a comprehensive MRSA colonization surveillance program. It is not intended to diagnose MRSA infection nor to guide or monitor treatment for MRSA infections. Test performance is not FDA approved in patients less than 18 years old. Performed at [facility], [place] [address], [place], [place] [address]   Culture, Respiratory w Gram Stain     Status: None   Collection Time: 12/16/22  1:08 PM   Specimen: Tracheal Aspirate; Respiratory  Result Value Ref Range Status   Specimen Description TRACHEAL ASPIRATE  Final   Special Requests NONE  Final   Gram Stain   Final    MODERATE WBC PRESENT, PREDOMINANTLY PMN FEW GRAM POSITIVE COCCI IN CHAINS FEW GRAM NEGATIVE DIPLOCOCCI FEW GRAM NEGATIVE RODS    Culture   Final    ABUNDANT Normal respiratory flora-no Staph aureus or Pseudomonas seen Performed at [facility], [address], [place], [place] [address]    Report Status 12/18/2022 FINAL  Final  Surgical PCR screen     Status: None   Collection Time: 12/19/22  6:02 PM   Specimen: Nasal Mucosa; Nasal Swab  Result Value Ref Range Status   MRSA, PCR NEGATIVE NEGATIVE Final   Staphylococcus aureus NEGATIVE NEGATIVE Final    Comment: (NOTE) The Xpert SA Assay (FDA approved for NASAL specimens in patients 65 years of age and older), is one component of a comprehensive surveillance  program. It is not intended to diagnose infection nor to guide or monitor treatment. Performed at [facility], [place] [address] [place], [place] [address]     Labs: CBC: Recent Labs  Lab 12/17/22 0504 12/18/22 0424 12/19/22 0402 12/20/22 0041 12/21/22 0807  WBC 11.3* 6.6 5.6 5.6 9.1  HGB 12.7* 12.4* 13.6 13.6 13.9  HCT 37.3* 36.1* 39.1 40.3 39.1  MCV 101.9* 103.4* 101.3* 102.3* 99.7  PLT 100* 102* 112* 140* 762   Basic Metabolic Panel: Recent Labs  Lab 12/15/22 1310 12/15/22 1317 12/15/22 2350 12/16/22 0359 12/16/22 1605 12/17/22 0504 12/18/22 0550 12/19/22 0402 12/20/22 0041 12/21/22 0807  NA 134*   < > 137 135   < > 138 140 137 138 136  K 4.3   < > 3.2* 3.8   < > 3.9 3.9 4.0 4.1 4.2  CL 100   < > 102 103  --  106 104 103 103 103  CO2 19*  --  11* 15*  --  '25 28 25 25 25  '$ GLUCOSE 107*   < > 265* 235*  --  113* 107* 110* 118* 160*  BUN 18   < > 22 22  --  '15 16 12 15 20  '$ CREATININE 1.55*   < > 1.56* 1.34*  --  0.85 0.97 0.86 0.96 1.10  CALCIUM 8.8*  --  8.7* 8.8*  --  8.5* 8.3* 8.6* 8.8* 9.1  MG 2.0  --  2.0 2.0  --  1.9  --   --   --   --   PHOS 5.4*  --   --  2.3*  --  2.3*  --   --   --   --    < > = values in this interval not displayed.   Liver Function Tests: Recent Labs  Lab 12/15/22 1310 12/16/22 0359 12/17/22 0504  AST 83* 135* 76*  ALT 82* 177* 107*  ALKPHOS 80 74 59  BILITOT 1.3* 1.0 1.4*  PROT 6.7 6.6 5.9*  ALBUMIN 3.8 3.7 3.3*   CBG: Recent  Labs  Lab 12/18/22 1935 12/18/22 2349 12/19/22 0305 12/19/22 0725 12/19/22 1127  GLUCAP 98 122* 117* 113* 111*    Discharge time spent: greater than 30 minutes.  Signed: Patrecia Pour, MD [facility] 12/21/2022

## 2022-12-21 NOTE — Progress Notes (Signed)
Stable for transfer  PCCM off  Erick Colace ACNP-BC [place] Pager # [phone] OR # [phone] if no answer

## 2022-12-21 NOTE — TOC Progression Note (Signed)
Transition of Care [facility]) - Progression Note    Patient Details  Name: Matthew Pratt MRN: [id number] Date of Birth: 05-16-1951  Transition of Care [facility]) CM/SW [place], [place] Phone Number: 12/21/2022, 3:10 PM  Clinical Narrative:     CSW spoke with patient regarding dc plan. Patient would like to return back home with home health services. CSW informed CM. All questions answered. No further questions reported at this time.  Expected Discharge Plan: [place] Barriers to Discharge: Continued Medical Work up  Expected Discharge Plan and Services In-house Referral: Clinical Social Work Discharge Planning Services: CM Consult   Living arrangements for the past 2 months: Single Family Home                                       Social Determinants of Health (SDOH) Interventions SDOH Screenings   Food Insecurity: No Food Insecurity (12/16/2022)  Housing: Low Risk  (12/16/2022)  Transportation Needs: No Transportation Needs (12/16/2022)  Utilities: Not At Risk (12/16/2022)  Alcohol Screen: Low Risk  (04/19/2022)  Depression (PHQ2-9): Medium Risk (08/13/2022)  Financial Resource Strain: Medium Risk (04/19/2022)  Physical Activity: Inactive (04/19/2022)  Social Connections: Moderately Isolated (04/19/2022)  Stress: Stress Concern Present (04/19/2022)  Tobacco Use: Low Risk  (12/21/2022)    Readmission Risk Interventions     No data to display

## 2022-12-21 NOTE — Progress Notes (Addendum)
Electrophysiology Rounding Note  Patient Name: Matthew Pratt Date of Encounter: 12/21/2022  Primary Cardiologist: Elouise Munroe, MD Electrophysiologist: None   Subjective   The patient is doing well today.  NAEO  Inpatient Medications    Scheduled Meds:  aspirin EC  81 mg Oral Daily   Chlorhexidine Gluconate Cloth  6 each Topical Q0600   ezetimibe  10 mg Oral Daily   levothyroxine  125 mcg Oral Daily   lidocaine  1 patch Transdermal Q24H   pantoprazole  40 mg Oral Daily   QUEtiapine  25 mg Oral QHS   sodium chloride flush  3 mL Intravenous Q12H   sodium chloride flush  3 mL Intravenous Q12H   venlafaxine XR  112.5 mg Oral Daily   Continuous Infusions:   ceFAZolin (ANCEF) IV     PRN Meds: acetaminophen, acetaminophen, haloperidol lactate, ibuprofen, nitroGLYCERIN, ondansetron (ZOFRAN) IV, sodium chloride flush, traZODone   Vital Signs    Vitals:   12/21/22 0500 12/21/22 0504 12/21/22 0530 12/21/22 0600  BP:  (!) 110/56 113/83 120/82  Pulse: 65 61 63 62  Resp: '18 13 14 11  '$ Temp:      TempSrc:      SpO2: 93% 94% 93% 94%  Weight:      Height:        Intake/Output Summary (Last 24 hours) at 12/21/2022 0830 Last data filed at 12/21/2022 0600 Gross per 24 hour  Intake 483 ml  Output 1750 ml  Net -1267 ml   Filed Weights   12/16/22 0355 12/17/22 0500 12/20/22 0500  Weight: 90.2 kg 90.2 kg 84 kg    Physical Exam    GEN- The patient is well appearing, alert and oriented x 3 today.   HEENT- No gross abnormality.  Lungs- Clear to ausculation bilaterally, normal work of breathing Heart- Regular rate and rhythm, no murmurs, rubs or gallops GI- soft, NT, ND, + BS Extremities- no clubbing or cyanosis. No edema Neuro- No obvious focal abnormality.   Telemetry    NSR 60s (personally reviewed)   Patient Profile     Frederick Marro is a 72 y.o. male with a past medical history significant for CAD, BPH, HLD, and OSA on CPAP admitted for cardiac  arrest, shocked x 3 in the field. Cath with collateralized OM, elevated trop ~900, not felt to be primary event.    Echo 12/15/2022 LVEF 55%, mildly reduced RV, mild RAE, Mild MR.   Cath 12/15/2022 Prox LAD to Mid LAD lesion is 55% stenosed.  Dist LAD lesion is 45% stenosed. Prox Cx to Mid Cx lesion is 55% stenosed. 1st Mrg (previously placed Drug Eluting stent is 100% stenosed -> CTO with left to left and right left lateral Prox RCA lesion is 60% stenosed. Mid RCA lesion is 55% stenosed  Assessment & Plan    Aborted cardiac arrest S/p  A[place]ott DDD ICD 12/20/2022 by Dr. Lovena Le Review wound care and arm restrictions.  Discussed no driving x 6 months.  Start Toprol 25 mg daily EF has been normal.   BP has been soft, not currently on ACE/ARB.  Usual follow up in place.  2. 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 CAD Ok to resume/start BB.   EP will see as needed while remains here.   For questions or updates, please contact [place] Please consult [email or web address] for contact info under Cardiology/STEMI.  Signed, Shirley Friar, PA-C  12/21/2022, 8:30 AM   EP Attending  Patient  seen and examined. Agree with above. The patient is stable after DDD ICD insertion. His ICD check under my supervision demonstrates normal DDD ICD function. He is stable for DC home. Usual followup. With a VF arrest, he is not to drive for 6 months.  Carleene Overlie Santa Abdelrahman,MD

## 2022-12-21 NOTE — Progress Notes (Signed)
Occupational Therapy Treatment Patient Details Name: Matthew Pratt MRN: [id number] DOB: 05-Nov-1951 Today's Date: 12/21/2022   History of present illness 72 year old man presenting with witnessed OOH cardiac arrest +fall witnessed by neighbor. Total arrest time 8-9 minutes with 3 AED shocks. PMHx of CAD, prior STEMI, HLD, GERD, OSA, hypothyroidism, GB issues   OT comments  Matthew Pratt is making good progress, AIR has declined and family declined SNF therefore pt has plans to d/c home with [facility] services and support of neighbors. Session focused on medication management tasks and education of compensatory techniques for STM deficits. Overall pt completed medication management task with 0 errors, however it did take him 7 minutes to complete (automatic fail after 5 minutes). Reviewed importance of daily supervision and assist with IADLs initially, neighbor present and agreeable. Also reviewed LUE precautions, pt unable to indep carryover during functional tasks. OT to follow acutely. Recommend 24/7 supervision A and maximal HH services.    Recommendations for follow up therapy are one component of a multi-disciplinary discharge planning process, led by the attending physician.  Recommendations may be updated based on patient status, additional functional criteria and insurance authorization.    Follow Up Recommendations  Home health OT (with daily supervision A for IADLs)     Assistance Recommended at Discharge Frequent or constant Supervision/Assistance  Patient can return home with the following  A little help with walking and/or transfers;A little help with bathing/dressing/bathroom;Assistance with cooking/housework;Assistance with feeding;Direct supervision/assist for medications management;Direct supervision/assist for financial management;Assist for transportation;Help with stairs or ramp for entrance   Equipment Recommendations  Tub/shower seat       Precautions / Restrictions  Precautions Precautions: Fall Precaution Comments: ICD 1/15 Restrictions Weight Bearing Restrictions: No       Mobility Bed Mobility Overal bed mobility: Needs Assistance Bed Mobility: Supine to Sit, Sit to Supine     Supine to sit: Supervision Sit to supine: Supervision   General bed mobility comments: cues for LUE precautions    Transfers Overall transfer level: Needs assistance Equipment used: None Transfers: Sit to/from Stand Sit to Stand: Min guard                 Balance Overall balance assessment: Mild deficits observed, not formally tested           ADL either performed or assessed with clinical judgement   ADL Overall ADL's : Needs assistance/impaired Eating/Feeding: Independent           Functional mobility during ADLs: Minimal assistance General ADL Comments: session focused on medication management task and educating on compensatory techniques for STM deficits    Extremity/Trunk Assessment Upper Extremity Assessment Upper Extremity Assessment: LUE deficits/detail LUE Deficits / Details: ICD restrictions LUE Coordination: decreased gross motor   Lower Extremity Assessment Lower Extremity Assessment: Defer to PT evaluation           Perception Perception Perception: Within Functional Limits   Praxis Praxis Praxis: Intact    Cognition Arousal/Alertness: Awake/alert Behavior During Therapy: WFL for tasks assessed/performed Overall Cognitive Status: Impaired/Different from baseline Area of Impairment: Orientation, Memory, Safety/judgement, Awareness, Problem solving                 Orientation Level: Disoriented to, Time   Memory: Decreased short-term memory, Decreased recall of precautions   Safety/Judgement: Decreased awareness of safety, Decreased awareness of deficits Awareness: Emergent Problem Solving: Requires verbal cues General Comments: very poor delayed recall, definite STM deficits. Unable to recall LUE  precautions after ICD  placement and seemingly unaware of procedure until oriented to surgical dressing.              General Comments VSS on RA, pts neighbor present and supportive. reviewed IADL supervision and STM compensatory techniques    Pertinent Vitals/ Pain       Pain Assessment Pain Assessment: No/denies pain   Frequency  Min 2X/week        Progress Toward Goals  OT Goals(current goals can now be found in the care plan section)  Progress towards OT goals: Progressing toward goals  Acute Rehab OT Goals Patient Stated Goal: to go home OT Goal Formulation: With patient Time For Goal Achievement: 01/01/23 Potential to Achieve Goals: Good ADL Goals Pt Will Perform Grooming: Independently;standing Pt Will Perform Lower Body Dressing: Independently;sit to/from stand Pt Will Transfer to Toilet: Independently;ambulating Additional ADL Goal #1: pt will indep complete IADL medicaiton management task Additional ADL Goal #2: Pt will complete a 3-step wayfinding task with generalized supervision A  Plan Discharge plan needs to be updated       AM-PAC OT "6 Clicks" Daily Activity     Outcome Measure   Help from another person eating meals?: None Help from another person taking care of personal grooming?: A Little Help from another person toileting, which includes using toliet, bedpan, or urinal?: A Little Help from another person bathing (including washing, rinsing, drying)?: A Little Help from another person to put on and taking off regular upper body clothing?: A Little Help from another person to put on and taking off regular lower body clothing?: A Little 6 Click Score: 19    End of Session    OT Visit Diagnosis: Other abnormalities of gait and mobility (R26.89);Unsteadiness on feet (R26.81);Muscle weakness (generalized) (M62.81)   Activity Tolerance Patient tolerated treatment well   Patient Left in bed;with call bell/phone within reach   Nurse Communication  Mobility status        Time: 7564-3329 OT Time Calculation (min): 27 min  Charges: OT General Charges $OT Visit: 1 Visit OT Treatments $Therapeutic Activity: 23-37 mins   Matthew Pratt 12/21/2022, 4:57 PM

## 2022-12-21 NOTE — Progress Notes (Signed)
Rounding Note    Patient Name: Matthew Pratt Date of Encounter: 12/21/2022  [place] Cardiologist: Elouise Munroe, MD   Subjective   Feeing well.  Memory improving. Asking good questions.   Inpatient Medications    Scheduled Meds:  aspirin EC  81 mg Oral Daily   Chlorhexidine Gluconate Cloth  6 each Topical Q0600   ezetimibe  10 mg Oral Daily   levothyroxine  125 mcg Oral Daily   lidocaine  1 patch Transdermal Q24H   pantoprazole  40 mg Oral Daily   QUEtiapine  25 mg Oral QHS   sodium chloride flush  3 mL Intravenous Q12H   sodium chloride flush  3 mL Intravenous Q12H   venlafaxine XR  112.5 mg Oral Daily   Continuous Infusions:   ceFAZolin (ANCEF) IV     PRN Meds: acetaminophen, acetaminophen, haloperidol lactate, ibuprofen, nitroGLYCERIN, ondansetron (ZOFRAN) IV, sodium chloride flush, traZODone   Vital Signs    Vitals:   12/21/22 0500 12/21/22 0504 12/21/22 0530 12/21/22 0600  BP:  (!) 110/56 113/83 120/82  Pulse: 65 61 63 62  Resp: '18 13 14 11  '$ Temp:      TempSrc:      SpO2: 93% 94% 93% 94%  Weight:      Height:        Intake/Output Summary (Last 24 hours) at 12/21/2022 0801 Last data filed at 12/21/2022 0600 Gross per 24 hour  Intake 483 ml  Output 1750 ml  Net -1267 ml      12/20/2022    5:00 AM 12/17/2022    5:00 AM 12/16/2022    3:55 AM  Last 3 Weights  Weight (lbs) 185 lb 3 oz 198 lb 13.7 oz 198 lb 13.7 oz  Weight (kg) 84 kg 90.2 kg 90.2 kg      Telemetry    Sinus rhythm.  PVCs.  NSVT - Personally Reviewed  ECG    N/a - Personally Reviewed  Physical Exam   VS:  BP 120/82   Pulse 62   Temp 97.6 F (36.4 C) (Oral)   Resp 11   Ht '6\' 1"'$  (1.854 m)   Wt 84 kg   SpO2 94%   BMI 24.43 kg/m  , BMI Body mass index is 24.43 kg/m. GENERAL:  Well appearing HEENT: Pupils equal round and reactive, fundi not visualized, oral mucosa unremarkable NECK:  No jugular venous distention, waveform within normal limits, carotid  upstroke brisk and symmetric, no bruits, no thyromegaly LUNGS:  Clear to auscultation bilaterally HEART:  RRR.  PMI not displaced or sustained,S1 and S2 within normal limits, no S3, no S4, no clicks, no rubs, no murmurs ABD:  Flat, positive bowel sounds normal in frequency in pitch, no bruits, no rebound, no guarding, no midline pulsatile mass, no hepatomegaly, no splenomegaly EXT:  2 plus pulses throughout, no edema, no cyanosis no clubbing SKIN:  No rashes no nodules.  Multiple ecchymoses. NEURO:  Cranial nerves II through XII grossly intact, motor grossly intact throughout PSYCH:  Cognitively intact, oriented to person place and time   Labs    High Sensitivity Troponin:   Recent Labs  Lab 12/15/22 1310 12/15/22 2115 12/16/22 0251 12/16/22 0359  TROPONINIHS 60* 910* 594* 518*     Chemistry Recent Labs  Lab 12/15/22 1310 12/15/22 1317 12/15/22 2350 12/16/22 0359 12/16/22 1605 12/17/22 0504 12/18/22 0550 12/19/22 0402 12/20/22 0041  NA 134*   < > 137 135   < > 138 140  137 138  K 4.3   < > 3.2* 3.8   < > 3.9 3.9 4.0 4.1  CL 100   < > 102 103  --  106 104 103 103  CO2 19*  --  11* 15*  --  '25 28 25 25  '$ GLUCOSE 107*   < > 265* 235*  --  113* 107* 110* 118*  BUN 18   < > 22 22  --  '15 16 12 15  '$ CREATININE 1.55*   < > 1.56* 1.34*  --  0.85 0.97 0.86 0.96  CALCIUM 8.8*  --  8.7* 8.8*  --  8.5* 8.3* 8.6* 8.8*  MG 2.0  --  2.0 2.0  --  1.9  --   --   --   PROT 6.7  --   --  6.6  --  5.9*  --   --   --   ALBUMIN 3.8  --   --  3.7  --  3.3*  --   --   --   AST 83*  --   --  135*  --  76*  --   --   --   ALT 82*  --   --  177*  --  107*  --   --   --   ALKPHOS 80  --   --  74  --  59  --   --   --   BILITOT 1.3*  --   --  1.0  --  1.4*  --   --   --   GFRNONAA 47*   < > 47* 56*  --  >60 >60 >60 >60  ANIONGAP 15  --  24* 17*  --  '7 8 9 10   '$ < > = values in this interval not displayed.    Lipids  Recent Labs  Lab 12/18/22 0424  CHOL 170  TRIG 123  HDL 45  LDLCALC 100*   CHOLHDL 3.8    Hematology Recent Labs  Lab 12/18/22 0424 12/19/22 0402 12/20/22 0041  WBC 6.6 5.6 5.6  RBC 3.49* 3.86* 3.94*  HGB 12.4* 13.6 13.6  HCT 36.1* 39.1 40.3  MCV 103.4* 101.3* 102.3*  MCH 35.5* 35.2* 34.5*  MCHC 34.3 34.8 33.7  RDW 12.4 12.1 12.0  PLT 102* 112* 140*   Thyroid No results for input(s): "TSH", "FREET4" in the last 168 hours.  BNPNo results for input(s): "BNP", "PROBNP" in the last 168 hours.  DDimer No results for input(s): "DDIMER" in the last 168 hours.   Radiology    DG Chest Port 1 View  Result Date: 12/20/2022 CLINICAL DATA:  Defibrillator placement EXAM: PORTABLE CHEST 1 VIEW COMPARISON:  12/15/2012 FINDINGS: Endotracheal tube and gastric catheter have been removed. New defibrillator is noted in satisfactory position. No pneumothorax is noted. Mild left basilar atelectasis is seen. No bony abnormality is noted. IMPRESSION: No pneumothorax following defibrillator placement Electronically Signed   By: Inez Catalina M.D.   On: 12/20/2022 21:18   EP PPM/ICD IMPLANT  Result Date: 12/20/2022 CONCLUSIONS:  1. 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  2. Successful ICD implantation.  3. No early apparent complications. ICD Criteria Current LVEF:50%%. Within 12 months prior to implant: Yes Heart failure history: Yes, Class II Cardiomyopathy history: Yes, Ischemic Cardiomyopathy - Prior MI. Atrial Fibrillation/Atrial Flutter: Yes, Paroxysmal. Ventricular tachycardia history: No. Cardiac arrest history: Yes, Ventricular Fibrillation. History of syndromes with risk  of sudden death: No. Previous ICD: No. Current ICD indication: Secondary PPM indication: No.  Class I or II Bradycardia indication present: No Beta Blocker therapy for 3 or more months: yes. Ace Inhibitor/ARB therapy for 3 or more months: No, medical reason. Cristopher Peru, MD 4:28 PM 12/20/2022    Cardiac Studies   Echo 12/15/22:  1. Left  ventricular ejection fraction, by estimation, is 55%. The left  ventricle has low normal function. The left ventricle demonstrates  regional wall motion abnormalities (see scoring diagram/findings for  description). Left ventricular diastolic  parameters were normal.   2. Right ventricular systolic function is mildly reduced. The right  ventricular size is mildly enlarged. Tricuspid regurgitation signal is  inadequate for assessing PA pressure.   3. Right atrial size was mildly dilated.   4. The mitral valve is grossly normal. Mild mitral valve regurgitation.  No evidence of mitral stenosis.   5. The aortic valve is grossly normal. There is mild thickening of the  aortic valve. Aortic valve regurgitation is not visualized. No aortic  stenosis is present.   6. The inferior vena cava is normal in size with greater than 50%  respiratory variability, suggesting right atrial pressure of 3 mmHg.   LHC 12/15/22:   Prox LAD to Mid LAD lesion is 55% stenosed.  Dist LAD lesion is 45% stenosed.   Prox Cx to Mid Cx lesion is 55% stenosed.   1st Mrg (previously placed Drug Eluting stent is 100% stenosed -> CTO with left to left and right left lateral   Prox RCA lesion is 60% stenosed. Mid RCA lesion is 55% stenosed.   LV end diastolic pressure is low.   There is no aortic valve stenosis.   POST-CATH DIAGNOSES Diffuse moderate to severe disease with calcification in all the proximal vessels, and likely CTO of previous OM1 stent filling via collaterals from both left to right and right to left. No obvious culprit lesion to explain cardiac arrest. Low LVEDP of 6 mmHg. A-line pressures were in the high 80s to low 90s compared to cuff . Hypokalemia noted on labs-potassium 2.9.  Was started on IV potassium 10 mg once  Patient Profile     72 y.o. male  with CAD, OSA on CPAP, hypothyroidism, and HL admitted with cardiac arrest.  Assessment & Plan    # Cardiac arrest: VF noted by EMS.  Etiology not  identified.  Stable CAD and normal LVEF this admission.  Now s/p St. Jude dual chamber ICD.  # Intermittent AV block:  Brief CHB when arterial line was pulled.  S/p ICD as above.   # Anoxic brain injury: Improving.   # CAD:  # Hyperlipidemia: Cath showed moderate disease with CTO OM with R-->L collaterals.  Continue medical management.  Continue aspirin and Zetia.  Lipids not well-controlled.  Consider non-statin alternatives as an outpatient.  Beta blocker as able after device implantation.  BP and HR have been low, so we will not add for now.  # AKI: Resolved.  [place] will sign off.   Medication Recommendations:  no changes Other recommendations (labs, testing, etc):  none Follow up as an outpatient:  we will arrange     For questions or updates, please contact [place] Please consult [email or web address] for contact info under        Signed, Skeet Latch, MD  12/21/2022, 8:01 AM

## 2022-12-21 NOTE — TOC Transition Note (Addendum)
Transition of Care [facility]) - CM/SW Discharge Note   Patient Details  Name: Matthew Pratt MRN: [id number] Date of Birth: 08-Mar-1951  [facility] CM/SW Contact:  Erenest Rasher, RN Phone Number: [phone] 12/21/2022, 4:13 PM   Clinical Narrative:     TOC CM spoke to pt at bedside. States he is able to dc home. He has a way to get into his home via keyless remote system. States he has CPAP, shower chair and Life Alert at home. Educate on the importance of wearing his Life Alert system 24/7. States he does not have financial means to hire a caregiver. His son and sister live out of town. Offered choice for HH, pt agreeable to [place]. Contacted [place], [place] with new referral.   Neighbor to provide transportation home.   Final next level of care: Home w Home Health Services Barriers to Discharge: No Barriers Identified   Patient Goals and CMS Choice CMS Medicare.gov Compare Post Acute Care list provided to:: Patient Represenative (must comment) (Patients son Yvone Neu) Choice offered to / list presented to : Adult Children Yvone Neu)  Discharge Placement                         Discharge Plan and Services Additional resources added to the After Visit Summary for   In-house Referral: Clinical Social Work Discharge Planning Services: CM Consult Post Acute Care Choice: Home Health                    HH Arranged: RN [facility] Agency: [place] Date [place]: 12/21/22 Time [place]: [place] Representative spoke with at [place]: Otila Kluver  Social Determinants of Health ([place]) Interventions SDOH Screenings   Food Insecurity: No Food Insecurity (12/16/2022)  Housing: Low Risk  (12/16/2022)  Transportation Needs: No Transportation Needs (12/16/2022)  Utilities: Not At Risk (12/16/2022)  Alcohol Screen: Low Risk  (04/19/2022)  Depression (PHQ2-9): Medium Risk (08/13/2022)  Financial Resource Strain: Medium Risk (04/19/2022)   Physical Activity: Inactive (04/19/2022)  Social Connections: Moderately Isolated (04/19/2022)  Stress: Stress Concern Present (04/19/2022)  Tobacco Use: Low Risk  (12/21/2022)     Readmission Risk Interventions     No data to display

## 2022-12-21 NOTE — Progress Notes (Signed)
The patient is having significant short term memory impairments which preclude a safe discharge at this time. SNF was recommended with an alternative being to return home with 24/7 assistance. Under the impression that this was available, he was slated for discharge earlier today. It is getting late, transportation issues are noted. OT and RN have apprehension regarding discharge at this time. I have alerted TOC team to readdress barriers to discharge tomorrow morning.  Vance Gather, MD 12/21/2022 5:34 PM

## 2022-12-22 ENCOUNTER — Other Ambulatory Visit (HOSPITAL_COMMUNITY): Payer: Self-pay

## 2022-12-22 DIAGNOSIS — I469 Cardiac arrest, cause unspecified: Secondary | ICD-10-CM | POA: Diagnosis not present

## 2022-12-22 NOTE — Progress Notes (Signed)
PROGRESS NOTE    Matthew Pratt  EQA:[id number] DOB: March 16, 1951 DOA: 12/15/2022 PCP: [name], MD    Brief Narrative:  Patient with multiple cardiovascular comorbidities was admitted with out-of-hospital cardiac arrest and shockable rhythm.  Admitted to intensive care unit.  Clinically improved.  Underwent ICD placement 1/15 and medically stabilized.   Assessment & Plan:   V-fib cardiac arrest, improved.  Now has ICD. Coronary artery disease Hyperlipidemia Acute kidney injury, resolved  Patient seen and examined in the morning rounds.  It was too late for him to be discharged last night as he lives alone and was deemed not safe for discharge.  Patient feels comfortable to go.  Early morning discharge ordered. Medications reviewed and instructions reviewed with the patient.   DVT prophylaxis: SCDs Start: 12/20/22 2101 Place and maintain sequential compression device Start: 12/19/22 2138 SCDs Start: 12/15/22 1326   Code Status: Full code Family Communication: None at bedside Disposition Plan: Status is: Inpatient Discharge today     Consultants:  Cardiology  Procedures:  ICD  Antimicrobials:  Completed   Subjective: Patient seen in the morning rounds.  Pleasant and interactive.  Feels comfortable going home.  Pain is controlled with Tylenol.  Objective: Vitals:   12/22/22 0600 12/22/22 0700 12/22/22 0800 12/22/22 0817  BP: 97/73 116/80 112/82   Pulse: 60 65 63   Resp: '12 16 19   '$ Temp:    98.2 F (36.8 C)  TempSrc:    Oral  SpO2: 92% (!) 89% 92%   Weight:      Height:        Intake/Output Summary (Last 24 hours) at 12/22/2022 9798 Last data filed at 12/22/2022 0400 Gross per 24 hour  Intake 343 ml  Output 1350 ml  Net -1007 ml   Filed Weights   12/16/22 0355 12/17/22 0500 12/20/22 0500  Weight: 90.2 kg 90.2 kg 84 kg    Examination:  General exam: Appears calm and comfortable  Respiratory system: Clear to auscultation. Respiratory  effort normal. Cardiovascular system: S1 & S2 heard, RRR.  Left precordial ICD in place.  Dry and clean dressing.   Gastrointestinal system: Abdomen is nondistended, soft and nontender. No organomegaly or masses felt. Normal bowel sounds heard. Central nervous system: Alert and oriented.  Mobilized.  She    Data Reviewed: I have personally reviewed following labs and imaging studies  CBC: Recent Labs  Lab 12/17/22 0504 12/18/22 0424 12/19/22 0402 12/20/22 0041 12/21/22 0807  WBC 11.3* 6.6 5.6 5.6 9.1  HGB 12.7* 12.4* 13.6 13.6 13.9  HCT 37.3* 36.1* 39.1 40.3 39.1  MCV 101.9* 103.4* 101.3* 102.3* 99.7  PLT 100* 102* 112* 140* 921   Basic Metabolic Panel: Recent Labs  Lab 12/15/22 1310 12/15/22 1317 12/15/22 2350 12/16/22 0359 12/16/22 1605 12/17/22 0504 12/18/22 0550 12/19/22 0402 12/20/22 0041 12/21/22 0807  NA 134*   < > 137 135   < > 138 140 137 138 136  K 4.3   < > 3.2* 3.8   < > 3.9 3.9 4.0 4.1 4.2  CL 100   < > 102 103  --  106 104 103 103 103  CO2 19*  --  11* 15*  --  '25 28 25 25 25  '$ GLUCOSE 107*   < > 265* 235*  --  113* 107* 110* 118* 160*  BUN 18   < > 22 22  --  '15 16 12 15 20  '$ CREATININE 1.55*   < > 1.56* 1.34*  --  0.85 0.97 0.86 0.96 1.10  CALCIUM 8.8*  --  8.7* 8.8*  --  8.5* 8.3* 8.6* 8.8* 9.1  MG 2.0  --  2.0 2.0  --  1.9  --   --   --   --   PHOS 5.4*  --   --  2.3*  --  2.3*  --   --   --   --    < > = values in this interval not displayed.   GFR: Estimated Creatinine Clearance: 67.6 mL/min (by C-G formula based on SCr of 1.1 mg/dL). Liver Function Tests: Recent Labs  Lab 12/15/22 1310 12/16/22 0359 12/17/22 0504  AST 83* 135* 76*  ALT 82* 177* 107*  ALKPHOS 80 74 59  BILITOT 1.3* 1.0 1.4*  PROT 6.7 6.6 5.9*  ALBUMIN 3.8 3.7 3.3*   No results for input(s): "LIPASE", "AMYLASE" in the last 168 hours. No results for input(s): "AMMONIA" in the last 168 hours. Coagulation Profile: Recent Labs  Lab 12/15/22 1310  INR 1.1   Cardiac  Enzymes: No results for input(s): "CKTOTAL", "CKMB", "CKMBINDEX", "TROPONINI" in the last 168 hours. BNP (last 3 results) No results for input(s): "PROBNP" in the last 8760 hours. HbA1C: No results for input(s): "HGBA1C" in the last 72 hours. CBG: Recent Labs  Lab 12/18/22 1935 12/18/22 2349 12/19/22 0305 12/19/22 0725 12/19/22 1127  GLUCAP 98 122* 117* 113* 111*   Lipid Profile: No results for input(s): "CHOL", "HDL", "LDLCALC", "TRIG", "CHOLHDL", "LDLDIRECT" in the last 72 hours. Thyroid Function Tests: No results for input(s): "TSH", "T4TOTAL", "FREET4", "T3FREE", "THYROIDAB" in the last 72 hours. Anemia Panel: No results for input(s): "VITAMINB12", "FOLATE", "FERRITIN", "TIBC", "IRON", "RETICCTPCT" in the last 72 hours. Sepsis Labs: Recent Labs  Lab 12/15/22 1310 12/15/22 2115 12/16/22 0359 12/16/22 1115 12/16/22 1536  PROCALCITON <0.10  --   --   --   --   LATICACIDVEN  --  >9.0* 7.1* 1.8 0.9    Recent Results (from the past 240 hour(s))  Culture, blood (Routine X 2) w Reflex to ID Panel     Status: None   Collection Time: 12/15/22  9:02 PM   Specimen: BLOOD LEFT HAND  Result Value Ref Range Status   Specimen Description BLOOD LEFT HAND  Final   Special Requests IN PEDIATRIC BOTTLE Blood Culture adequate volume  Final   Culture   Final    NO GROWTH 5 DAYS Performed at [facility], [place] [address] [place], [place] [address]    Report Status 12/20/2022 FINAL  Final  Culture, blood (Routine X 2) w Reflex to ID Panel     Status: None   Collection Time: 12/15/22  9:05 PM   Specimen: BLOOD LEFT HAND  Result Value Ref Range Status   Specimen Description BLOOD LEFT HAND  Final   Special Requests IN PEDIATRIC BOTTLE Blood Culture adequate volume  Final   Culture   Final    NO GROWTH 5 DAYS Performed at [facility], [place] [address] [place], [place] [address]    Report Status 12/20/2022 FINAL  Final  Urine Culture     Status: None   Collection Time:  12/16/22 12:22 PM   Specimen: Urine, Catheterized  Result Value Ref Range Status   Specimen Description URINE, CATHETERIZED  Final   Special Requests NONE  Final   Culture   Final    NO GROWTH Performed at [facility], [place] [address], [place], [place] [address]  Report Status 12/17/2022 FINAL  Final  MRSA Next Gen by PCR, Nasal     Status: None   Collection Time: 12/16/22 12:22 PM   Specimen: Nasal Mucosa; Nasal Swab  Result Value Ref Range Status   MRSA by PCR Next Gen NOT DETECTED NOT DETECTED Final    Comment: (NOTE) The GeneXpert MRSA Assay (FDA approved for NASAL specimens only), is one component of a comprehensive MRSA colonization surveillance program. It is not intended to diagnose MRSA infection nor to guide or monitor treatment for MRSA infections. Test performance is not FDA approved in patients less than 28 years old. Performed at [facility], [place] [address], [place], [place] [address]   Culture, Respiratory w Gram Stain     Status: None   Collection Time: 12/16/22  1:08 PM   Specimen: Tracheal Aspirate; Respiratory  Result Value Ref Range Status   Specimen Description TRACHEAL ASPIRATE  Final   Special Requests NONE  Final   Gram Stain   Final    MODERATE WBC PRESENT, PREDOMINANTLY PMN FEW GRAM POSITIVE COCCI IN CHAINS FEW GRAM NEGATIVE DIPLOCOCCI FEW GRAM NEGATIVE RODS    Culture   Final    ABUNDANT Normal respiratory flora-no Staph aureus or Pseudomonas seen Performed at [facility], [address] [place], [place] [address]    Report Status 12/18/2022 FINAL  Final  Surgical PCR screen     Status: None   Collection Time: 12/19/22  6:02 PM   Specimen: Nasal Mucosa; Nasal Swab  Result Value Ref Range Status   MRSA, PCR NEGATIVE NEGATIVE Final   Staphylococcus aureus NEGATIVE NEGATIVE Final    Comment: (NOTE) The Xpert SA Assay (FDA approved for NASAL specimens in patients 57 years of age and older), is one component of a  comprehensive surveillance program. It is not intended to diagnose infection nor to guide or monitor treatment. Performed at [facility], [place] [address] [place],  [address]          Radiology Studies: DG Chest Port 1 View  Result Date: 12/20/2022 CLINICAL DATA:  Defibrillator placement EXAM: PORTABLE CHEST 1 VIEW COMPARISON:  12/15/2012 FINDINGS: Endotracheal tube and gastric catheter have been removed. New defibrillator is noted in satisfactory position. No pneumothorax is noted. Mild left basilar atelectasis is seen. No bony abnormality is noted. IMPRESSION: No pneumothorax following defibrillator placement Electronically Signed   By: Inez Catalina M.D.   On: 12/20/2022 21:18   EP PPM/ICD IMPLANT  Result Date: 12/20/2022 CONCLUSIONS:  1. 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  2. Successful ICD implantation.  3. No early apparent complications. ICD Criteria Current LVEF:50%%. Within 12 months prior to implant: Yes Heart failure history: Yes, Class II Cardiomyopathy history: Yes, Ischemic Cardiomyopathy - Prior MI. Atrial Fibrillation/Atrial Flutter: Yes, Paroxysmal. Ventricular tachycardia history: No. Cardiac arrest history: Yes, Ventricular Fibrillation. History of syndromes with risk of sudden death: No. Previous ICD: No. Current ICD indication: Secondary PPM indication: No.  Class I or II Bradycardia indication present: No Beta Blocker therapy for 3 or more months: yes. Ace Inhibitor/ARB therapy for 3 or more months: No, medical reason. Cristopher Peru, MD 4:28 PM 12/20/2022        Scheduled Meds:  aspirin EC  81 mg Oral Daily   Chlorhexidine Gluconate Cloth  6 each Topical Q0600   ezetimibe  10 mg Oral Daily   levothyroxine  125 mcg Oral Daily  lidocaine  1 patch Transdermal Q24H   metoprolol succinate  25 mg Oral Daily   pantoprazole  40 mg Oral Daily   QUEtiapine  25 mg Oral QHS   sodium chloride  flush  3 mL Intravenous Q12H   sodium chloride flush  3 mL Intravenous Q12H   venlafaxine XR  112.5 mg Oral Daily   Continuous Infusions:   LOS: 7 days    Time spent: 25 minutes    Barb Merino, MD [facility] Pager [phone]

## 2022-12-22 NOTE — TOC CM/SW Note (Signed)
TOC CM spoke received notification for attending to verify dc home is safe plan. CM contacted pt's son and son states his sister is coming in from [place] to stay with patient for the next few days. He will contact pt's neighbor to bring clothes and provide transportation home. Discussed with pt's son long term plan for pt living alone in home such as ALF, private duty caregiver, moving closer to him in [place], or Life Alert montior along with cameras for son to monitor pt in the home. He was agreeable to arranging a long term plan for patient living in the home. Pt was oriented to place, time and situation. He feels confident he can resume his care at home. [place], Heart Failure TOC CM [phone]

## 2022-12-22 NOTE — Progress Notes (Signed)
Occupational Therapy Treatment Patient Details Name: Trisha Morandi MRN: [id number] DOB: 25-Nov-1951 Today's Date: 12/22/2022   History of present illness 72 year old man presenting with witnessed OOH cardiac arrest +fall witnessed by neighbor. Total arrest time 8-9 minutes with 3 AED shocks. PMHx of CAD, prior STEMI, HLD, GERD, OSA, hypothyroidism, GB issues   OT comments  [name] has plans to d/c home alone today with support of neighbor. He continues to present with STM deficits, unable to recall OT session from previous date or ICD precautions. Session focused on ICD handout with education. Thorough review completed and pt verbalized great understanding however he continued to required cues to carryover during functional tasks as pt is LHD (I.e. UB dressing, cell phone use, weight bearing and ROM). OT to continue to follow. Recommend maximal home health services and 24/7 superivsion initially and daily supervision of IADLs.    Recommendations for follow up therapy are one component of a multi-disciplinary discharge planning process, led by the attending physician.  Recommendations may be updated based on patient status, additional functional criteria and insurance authorization.    Follow Up Recommendations  Home health OT (with 24/7 supervision A)     Assistance Recommended at Discharge Frequent or constant Supervision/Assistance  Patient can return home with the following  A little help with walking and/or transfers;A little help with bathing/dressing/bathroom;Assistance with cooking/housework;Assistance with feeding;Direct supervision/assist for medications management;Direct supervision/assist for financial management;Assist for transportation;Help with stairs or ramp for entrance   Equipment Recommendations  Tub/shower seat       Precautions / Restrictions Precautions Precautions: Fall Precaution Comments: ICD 1/15 Restrictions Weight Bearing Restrictions: No Other  Position/Activity Restrictions: LUE precautions       Mobility Bed Mobility Overal bed mobility: Needs Assistance Bed Mobility: Supine to Sit     Supine to sit: Supervision Sit to supine: Supervision   General bed mobility comments: with cues to maintain LUE precautions    Transfers       Balance Overall balance assessment: Mild deficits observed, not formally tested             ADL either performed or assessed with clinical judgement   ADL Overall ADL's : Needs assistance/impaired                 Upper Body Dressing : Minimal assistance;Sitting Upper Body Dressing Details (indicate cue type and reason): min A for very cues for compensatory techniques. LUE in first, no over head                 Functional mobility during ADLs: Minimal assistance General ADL Comments: min A for verbal cues to maintain LUE precautions. no physical assist needed    Extremity/Trunk Assessment Upper Extremity Assessment Upper Extremity Assessment: LUE deficits/detail LUE Deficits / Details: ICD restrictions LUE Coordination: decreased gross motor   Lower Extremity Assessment Lower Extremity Assessment: Defer to PT evaluation        Vision   Vision Assessment?: No apparent visual deficits   Perception Perception Perception: Within Functional Limits   Praxis Praxis Praxis: Intact    Cognition Arousal/Alertness: Awake/alert Behavior During Therapy: WFL for tasks assessed/performed Overall Cognitive Status: Impaired/Different from baseline Area of Impairment: Memory, Safety/judgement                     Memory: Decreased recall of precautions, Decreased short-term memory   Safety/Judgement: Decreased awareness of safety, Decreased awareness of deficits     General Comments: alert and oriented. unable  to recall OT session from [facility] prior (medication management task). Session focused on ICD education with paperwork provided - pt asking great questions  however continues to have difficulty wtih self-monitoring and carrying over LUE precautions              General Comments VSS on RA. Pt asking questions in regard to the route of his cardiac arresst, deferred to MD. ICE precaution hand out provided    Pertinent Vitals/ Pain       Pain Assessment Pain Assessment: No/denies pain Pain Intervention(s): Monitored during session         Frequency  Min 2X/week        Progress Toward Goals  OT Goals(current goals can now be found in the care plan section)  Progress towards OT goals: Progressing toward goals  Acute Rehab OT Goals Patient Stated Goal: to d/c home OT Goal Formulation: With patient Time For Goal Achievement: 01/01/23 Potential to Achieve Goals: Good ADL Goals Pt Will Perform Grooming: Independently;standing Pt Will Perform Lower Body Dressing: Independently;sit to/from stand Pt Will Transfer to Toilet: Independently;ambulating Additional ADL Goal #1: pt will indep complete IADL medicaiton management task Additional ADL Goal #2: Pt will complete a 3-step wayfinding task with generalized supervision A  Plan Discharge plan needs to be updated       AM-PAC OT "6 Clicks" Daily Activity     Outcome Measure   Help from another person eating meals?: None Help from another person taking care of personal grooming?: A Little Help from another person toileting, which includes using toliet, bedpan, or urinal?: A Little Help from another person bathing (including washing, rinsing, drying)?: A Little Help from another person to put on and taking off regular upper body clothing?: A Little Help from another person to put on and taking off regular lower body clothing?: A Little 6 Click Score: 19    End of Session    OT Visit Diagnosis: Other abnormalities of gait and mobility (R26.89);Unsteadiness on feet (R26.81);Muscle weakness (generalized) (M62.81)   Activity Tolerance Patient tolerated treatment well   Patient Left  in bed;with call bell/phone within reach   Nurse Communication Mobility status        Time: 5038-8828 OT Time Calculation (min): 25 min  Charges: OT General Charges $OT Visit: 1 Visit OT Treatments $Self Care/Home Management : 8-22 mins    Elliot Cousin 12/22/2022, 9:04 AM

## 2022-12-22 NOTE — Progress Notes (Addendum)
DC note  Patient alert and oriented x4, verbalized understanding of dc instructions. Called and spoke with Seth Bake with home health services and they are planning a visit for Monday, January 22nd. Dr. Sloan Leiter is aware and is comfortable discharging this patient despite OT recommendation of 24/7 supervision.    Krystal Eaton, RN

## 2022-12-22 NOTE — Progress Notes (Addendum)
Nursing DC note  Patient alert and oriented x4, verbalized understanding of dc instructions. All personal belongings returned to patient.Toc meds also given to patient.Primary RN Benjamine Mola will continue to assess home  care needs before patient leaves the department.

## 2022-12-23 ENCOUNTER — Telehealth: Payer: Self-pay

## 2022-12-23 ENCOUNTER — Encounter: Payer: Self-pay | Admitting: Family Medicine

## 2022-12-23 ENCOUNTER — Telehealth: Payer: Self-pay | Admitting: *Deleted

## 2022-12-23 DIAGNOSIS — I469 Cardiac arrest, cause unspecified: Secondary | ICD-10-CM

## 2022-12-23 NOTE — Patient Outreach (Signed)
Care Coordination TOC Note Transition Care Management Follow-up Telephone Call Date of discharge and from where: 12/22/22-[facility]  Dx: "cardiac arrest" How have you been since you were released from the hospital? Patient voices that he is "doing reasonably well' since retuning home. Denies any pain or discomfort. He has been eating well and had a good night sleep last night. He was a little nausea earlier as he took med prior to eating but sxs have resolved and he knows to eat prior to taking meds.  Any questions or concerns? Yes-Patient concerned about instruction that he was given by Md to not drive for 6 months and noted on d/c instructions. He wants to know if the time frame can be changed to sooner. Advised patient that he would have to discuss this with MD during follow up app. Patient also wanting MDs to go over a summary of everything that happened to him while in the hospital and will discuss with MD.  Items Reviewed: Did the pt receive and understand the discharge instructions provided? Yes  Medications obtained and verified? Yes  Other? No  Any new allergies since your discharge? No  Dietary orders reviewed? Yes Do you have support at home?  Sister and brother in law in town to assist pt but leaving tomorrow then patient will be alone. States he has active life alert system in place.  Home Care and Equipment/Supplies: Were home health services ordered? Yes-RN If so, what is the name of the agency? [facility]  Has the agency set up a time to come to the patient's home? No-Discussed to follow up with agency if no call from them within 48-72hrs post-discharge. Confirmed that they have agency contact info.  Were any new equipment or medical supplies ordered?  No What is the name of the medical supply agency? N/A Were you able to get the supplies/equipment? not applicable Do you have any questions related to the use of the equipment or supplies? No  Functional Questionnaire: (I =  Independent and D = Dependent) ADLs: I  Bathing/Dressing- I  Meal Prep- I  Eating- I  Maintaining continence- I  Transferring/Ambulation- I  Managing Meds- I  Follow up appointments reviewed:  PCP Hospital f/u appt confirmed? No  .Patient agreeable to care guide calling to assist with scheduling. [facility] f/u appt confirmed? Yes  Scheduled to see Pharmacist visit on /30/24 at [facility], Chalmers Cater on 01/04/23 at 2:45pm, Lorretta Harp on 01/06/23 @ 2:45pm. Are transportation arrangements needed? Yes  If their condition worsens, is the pt aware to call PCP or go to the Emergency Dept.? Yes Was the patient provided with contact information for the PCP's office or ED? Yes Was to pt encouraged to call back with questions or concerns? Yes  SDOH assessments and interventions completed:   Yes SDOH Interventions Today    Flowsheet Row Most Recent Value  SDOH Interventions   Food Insecurity Interventions Intervention Not Indicated  Transportation Interventions Other (Comment)  [pt unable to drive due to current medical condition-needs transportation resources to get him to appts-urgent referral sent to care guide, pt will call [employer] to see if he has transportation benefit as well]       Care Coordination Interventions:  PCP follow up appointment requested Transportation arranged Referred for Care Coordination Services:  RN Care Coordinator Education provided     Encounter Outcome:  Pt. Visit Completed     Enzo Montgomery, RN,BSN,CCM [facility] Management Telephonic Care Management Coordinator Direct Phone: [phone] Toll Free:  [phone] Fax: [phone]

## 2022-12-23 NOTE — Progress Notes (Signed)
  Care Coordination  Note  12/23/2022 Name: Fumio Vandam MRN: [id number] DOB: [birth year]  Verlin Grills Heady is a 72 y.o. year old primary care patient of Burchette, Alinda Sierras, MD. I reached out to Verlin Grills Rod by phone today to assist with scheduling a follow up appointment. Verlin Grills Teel verbally consented to my assistance.       Follow up plan: Hospital Follow Up appointment scheduled with (Dr Elease Hashimoto) on (12/28/2022) at (330pm).  Julian Hy, [place] Direct Dial: [phone]

## 2022-12-24 ENCOUNTER — Telehealth: Payer: Self-pay | Admitting: Family Medicine

## 2022-12-24 ENCOUNTER — Telehealth: Payer: Self-pay | Admitting: *Deleted

## 2022-12-24 DIAGNOSIS — I469 Cardiac arrest, cause unspecified: Secondary | ICD-10-CM

## 2022-12-24 NOTE — Telephone Encounter (Signed)
Pt's daughter states pt was supposed to get [place] after an ICU stay for a pacemaker.  They haven't heard anything and are really needing this since he lives alone.

## 2022-12-27 ENCOUNTER — Telehealth: Payer: Self-pay | Admitting: *Deleted

## 2022-12-27 DIAGNOSIS — Z7982 Long term (current) use of aspirin: Secondary | ICD-10-CM | POA: Diagnosis not present

## 2022-12-27 DIAGNOSIS — M8580 Other specified disorders of bone density and structure, unspecified site: Secondary | ICD-10-CM | POA: Diagnosis not present

## 2022-12-27 DIAGNOSIS — H409 Unspecified glaucoma: Secondary | ICD-10-CM | POA: Diagnosis not present

## 2022-12-27 DIAGNOSIS — M720 Palmar fascial fibromatosis [Dupuytren]: Secondary | ICD-10-CM | POA: Diagnosis not present

## 2022-12-27 DIAGNOSIS — K219 Gastro-esophageal reflux disease without esophagitis: Secondary | ICD-10-CM | POA: Diagnosis not present

## 2022-12-27 DIAGNOSIS — E785 Hyperlipidemia, unspecified: Secondary | ICD-10-CM | POA: Diagnosis not present

## 2022-12-27 DIAGNOSIS — E039 Hypothyroidism, unspecified: Secondary | ICD-10-CM | POA: Diagnosis not present

## 2022-12-27 DIAGNOSIS — I2510 Atherosclerotic heart disease of native coronary artery without angina pectoris: Secondary | ICD-10-CM | POA: Diagnosis not present

## 2022-12-27 DIAGNOSIS — K760 Fatty (change of) liver, not elsewhere classified: Secondary | ICD-10-CM | POA: Diagnosis not present

## 2022-12-27 DIAGNOSIS — M47812 Spondylosis without myelopathy or radiculopathy, cervical region: Secondary | ICD-10-CM | POA: Diagnosis not present

## 2022-12-27 DIAGNOSIS — Z86010 Personal history of colonic polyps: Secondary | ICD-10-CM | POA: Diagnosis not present

## 2022-12-27 DIAGNOSIS — H90A32 Mixed conductive and sensorineural hearing loss, unilateral, left ear with restricted hearing on the contralateral side: Secondary | ICD-10-CM | POA: Diagnosis not present

## 2022-12-27 DIAGNOSIS — I255 Ischemic cardiomyopathy: Secondary | ICD-10-CM | POA: Diagnosis not present

## 2022-12-27 DIAGNOSIS — K2270 Barrett's esophagus without dysplasia: Secondary | ICD-10-CM | POA: Diagnosis not present

## 2022-12-27 DIAGNOSIS — M4312 Spondylolisthesis, cervical region: Secondary | ICD-10-CM | POA: Diagnosis not present

## 2022-12-27 DIAGNOSIS — K589 Irritable bowel syndrome without diarrhea: Secondary | ICD-10-CM | POA: Diagnosis not present

## 2022-12-27 DIAGNOSIS — L719 Rosacea, unspecified: Secondary | ICD-10-CM | POA: Diagnosis not present

## 2022-12-27 DIAGNOSIS — I252 Old myocardial infarction: Secondary | ICD-10-CM | POA: Diagnosis not present

## 2022-12-27 DIAGNOSIS — I442 Atrioventricular block, complete: Secondary | ICD-10-CM | POA: Diagnosis not present

## 2022-12-27 DIAGNOSIS — G4733 Obstructive sleep apnea (adult) (pediatric): Secondary | ICD-10-CM | POA: Diagnosis not present

## 2022-12-27 DIAGNOSIS — Z48812 Encounter for surgical aftercare following surgery on the circulatory system: Secondary | ICD-10-CM | POA: Diagnosis not present

## 2022-12-27 DIAGNOSIS — G931 Anoxic brain damage, not elsewhere classified: Secondary | ICD-10-CM | POA: Diagnosis not present

## 2022-12-27 NOTE — Telephone Encounter (Signed)
Referral placed and left detailed message on patient's voicemail informing him of this.

## 2022-12-27 NOTE — Telephone Encounter (Signed)
Patient has been scheduled for HFU on 12/28/2022. Home health referral placed

## 2022-12-27 NOTE — Telephone Encounter (Signed)
   Telephone encounter was:  Successful.  12/27/2022 Name: Matthew Pratt MRN: [id number] DOB: 1951-08-15  Matthew Pratt is a 72 y.o. year old male who is a primary care patient of Burchette, Alinda Sierras, MD . The community resource team was consulted for assistance with Transportation Needs   Care guide performed the following interventions: Patient provided with information about care guide support team and interviewed to confirm resource needs.  Follow Up Plan:Booked appt for 12/28/2022 with [facility] and tried to book upcoming appts with [facility] and had difficulty with the call patient did not want to wait so hung up and will try to book without the patient online as he was impatient with the [facility] [address] [place] , [place] [address] Email : Ashby Dawes. [email or web address]-moran [email or web address]

## 2022-12-27 NOTE — Telephone Encounter (Signed)
   Telephone encounter was:  Unsuccessful.  12/27/2022 Name: Matthew Pratt MRN: [id number] DOB: Jun 29, 1951  Unsuccessful outbound call made today to assist with:  Transportation Needs   Outreach Attempt:  1st Attempt  A HIPAA compliant voice message was left requesting a return call.  Instructed patient to call back at [phone].  [facility] [phone] , [place] [address] Email : Ashby Dawes. Greenauer-moran [email or web address]

## 2022-12-28 ENCOUNTER — Telehealth: Payer: Self-pay | Admitting: Family Medicine

## 2022-12-28 ENCOUNTER — Telehealth: Payer: Self-pay | Admitting: *Deleted

## 2022-12-28 ENCOUNTER — Encounter: Payer: Medicare Other | Admitting: *Deleted

## 2022-12-28 ENCOUNTER — Inpatient Hospital Stay: Payer: Medicare Other | Admitting: Family Medicine

## 2022-12-28 NOTE — Telephone Encounter (Signed)
Matthew Pratt from [facility] called to ask a couple of questions concerning this pt that was referred to their office. He stated if you all can give him a call that would be greatly appreciated.   [phone]  Please advise

## 2022-12-28 NOTE — Telephone Encounter (Signed)
I spoke with Matthew Pratt with [facility] home health and he stated that the patient may benefit from having outpatient PT as this will provide an opportunity for more visit and time with the patient. Matthew Pratt just wanted to notify us of this option and he states that this may have some benefit to the patient.

## 2022-12-28 NOTE — Telephone Encounter (Signed)
Left message for Matthew Pratt to return my call.

## 2022-12-28 NOTE — Telephone Encounter (Signed)
Says returning a call to Matthew Pratt

## 2022-12-28 NOTE — Telephone Encounter (Signed)
Please see previous note.

## 2022-12-28 NOTE — Telephone Encounter (Signed)
Matthew Pratt DOB: 1951/02/27 MRN: [id number]   RIDER WAIVER AND RELEASE OF LIABILITY  For purposes of improving physical access to our facilities, [facility] is pleased to partner with third parties to provide [facility] patients or other authorized individuals the option of convenient, on-demand ground transportation services (the [place]") through use of the technology service that enables users to request on-demand ground transportation from independent third-party providers.  By opting to use and accept these [employer], I, the undersigned, hereby agree on behalf of myself, and on behalf of any minor child using the Government social research officer for whom I am the parent or legal guardian, as follows:  Government social research officer provided to me are provided by independent third-party transportation providers who are not [employer] or employees and who are unaffiliated with [employer]. [facility] is neither a transportation carrier nor a common or public carrier. [facility] has no control over the quality or safety of the transportation that occurs as a result of the [employer]. [facility] cannot guarantee that any third-party transportation provider will complete any arranged transportation service. [facility] makes no representation, warranty, or guarantee regarding the reliability, timeliness, quality, safety, suitability, or availability of any of the Transport Services or that they will be error free. I fully understand that traveling by vehicle involves risks and dangers of serious bodily injury, including permanent disability, paralysis, and death. I agree, on behalf of myself and on behalf of any minor child using the Transport Services for whom I am the parent or legal guardian, that the entire risk arising out of my use of the [employer] remains solely with me, to the maximum extent permitted under applicable law. The [employer] are provided  "as is" and "as available." [facility] disclaims all representations and warranties, express, implied or statutory, not expressly set out in these terms, including the implied warranties of merchantability and fitness for a particular purpose. I hereby waive and release [facility], its agents, employees, officers, directors, representatives, insurers, attorneys, assigns, successors, subsidiaries, and affiliates from any and all past, present, or future claims, demands, liabilities, actions, causes of action, or suits of any kind directly or indirectly arising from acceptance and use of the [employer]. I further waive and release [facility] and its affiliates from all present and future liability and responsibility for any injury or death to persons or damages to property caused by or related to the use of the [employer]. I have read this Waiver and Release of Liability, and I understand the terms used in it and their legal significance. This Waiver is freely and voluntarily given with the understanding that my right (as well as the right of any minor child for whom I am the parent or legal guardian using the [employer]) to legal recourse against [facility] in connection with the [employer] is knowingly surrendered in return for use of these services.   I attest that I read the consent document to Matthew Grills Musial, gave Mr. [place] the opportunity to ask questions and answered the questions asked (if any). I affirm that Matthew Grills Hosea then provided consent for he's participation in this program.     Bonnielee Haff      Telephone encounter was:  Successful.  12/28/2022 Name: Matthew Pratt MRN: [id number] DOB: [birth year]  Matthew Pratt is a 72 y.o. year old male who is a primary care patient of Burchette, Alinda Sierras, MD . The community  resource team was consulted for assistance with Transportation Needs   Care guide performed the following  interventions: Patient provided with information about care guide support team and interviewed to confirm resource needs. Patient booked through [facility] and signed waiver , provided information on booking through [facility] in the future as they would not let me book his appts with out him to give permission  Follow Up Plan:  No further follow up planned at this time. The patient has been provided with needed resources.  [facility] [phone] [address] , [place] [address] Email : Ashby Dawes. Greenauer-moran '@[email or web address]

## 2022-12-29 ENCOUNTER — Encounter: Payer: Self-pay | Admitting: Pharmacist

## 2022-12-29 DIAGNOSIS — L719 Rosacea, unspecified: Secondary | ICD-10-CM | POA: Diagnosis not present

## 2022-12-29 DIAGNOSIS — K2270 Barrett's esophagus without dysplasia: Secondary | ICD-10-CM | POA: Diagnosis not present

## 2022-12-29 DIAGNOSIS — H90A32 Mixed conductive and sensorineural hearing loss, unilateral, left ear with restricted hearing on the contralateral side: Secondary | ICD-10-CM | POA: Diagnosis not present

## 2022-12-29 DIAGNOSIS — M47812 Spondylosis without myelopathy or radiculopathy, cervical region: Secondary | ICD-10-CM | POA: Diagnosis not present

## 2022-12-29 DIAGNOSIS — I252 Old myocardial infarction: Secondary | ICD-10-CM | POA: Diagnosis not present

## 2022-12-29 DIAGNOSIS — I2510 Atherosclerotic heart disease of native coronary artery without angina pectoris: Secondary | ICD-10-CM | POA: Diagnosis not present

## 2022-12-29 DIAGNOSIS — I442 Atrioventricular block, complete: Secondary | ICD-10-CM | POA: Diagnosis not present

## 2022-12-29 DIAGNOSIS — G931 Anoxic brain damage, not elsewhere classified: Secondary | ICD-10-CM | POA: Diagnosis not present

## 2022-12-29 DIAGNOSIS — M8580 Other specified disorders of bone density and structure, unspecified site: Secondary | ICD-10-CM | POA: Diagnosis not present

## 2022-12-29 DIAGNOSIS — K219 Gastro-esophageal reflux disease without esophagitis: Secondary | ICD-10-CM | POA: Diagnosis not present

## 2022-12-29 DIAGNOSIS — G720 Drug-induced myopathy: Secondary | ICD-10-CM

## 2022-12-29 DIAGNOSIS — M720 Palmar fascial fibromatosis [Dupuytren]: Secondary | ICD-10-CM | POA: Diagnosis not present

## 2022-12-29 DIAGNOSIS — E039 Hypothyroidism, unspecified: Secondary | ICD-10-CM | POA: Diagnosis not present

## 2022-12-29 DIAGNOSIS — H409 Unspecified glaucoma: Secondary | ICD-10-CM | POA: Diagnosis not present

## 2022-12-29 DIAGNOSIS — Z86010 Personal history of colonic polyps: Secondary | ICD-10-CM | POA: Diagnosis not present

## 2022-12-29 DIAGNOSIS — Z48812 Encounter for surgical aftercare following surgery on the circulatory system: Secondary | ICD-10-CM | POA: Diagnosis not present

## 2022-12-29 DIAGNOSIS — M4312 Spondylolisthesis, cervical region: Secondary | ICD-10-CM | POA: Diagnosis not present

## 2022-12-29 DIAGNOSIS — K589 Irritable bowel syndrome without diarrhea: Secondary | ICD-10-CM | POA: Diagnosis not present

## 2022-12-29 DIAGNOSIS — I255 Ischemic cardiomyopathy: Secondary | ICD-10-CM | POA: Diagnosis not present

## 2022-12-29 DIAGNOSIS — E785 Hyperlipidemia, unspecified: Secondary | ICD-10-CM | POA: Diagnosis not present

## 2022-12-29 DIAGNOSIS — Z7982 Long term (current) use of aspirin: Secondary | ICD-10-CM | POA: Diagnosis not present

## 2022-12-29 DIAGNOSIS — K760 Fatty (change of) liver, not elsewhere classified: Secondary | ICD-10-CM | POA: Diagnosis not present

## 2022-12-29 DIAGNOSIS — G4733 Obstructive sleep apnea (adult) (pediatric): Secondary | ICD-10-CM | POA: Diagnosis not present

## 2022-12-29 NOTE — Progress Notes (Addendum)
[facility])                                            [place] Team                                        Statin Quality Measure Assessment    12/29/2022  Marcus Schwandt Laban March 28, 1951 [id number]  Per review of chart and payor information, patient has a diagnosis of cardiovascular disease but is not currently filling a statin prescription.  This places patient into the [facility] (Statin Use In Patients with Cardiovascular Disease) measure for CMS.    Patient has documented statin intolerance but does not have a statin exclusion code associated with a visit for the current benefit year.    If deemed therapeutically appropriate, a statin exclusion code could be associated with the upcoming PCP visit.     Component Value Date/Time   CHOL 170 12/18/2022 0424   CHOL 163 03/25/2021 0954   TRIG 123 12/18/2022 0424   HDL 45 12/18/2022 0424   HDL 59 03/25/2021 0954   CHOLHDL 3.8 12/18/2022 0424   VLDL 25 12/18/2022 0424   LDLCALC 100 (H) 12/18/2022 0424   LDLCALC 86 03/25/2021 0954     Please consider ONE of the following recommendations:  Initiate high intensity statin Atorvastatin '40mg'$  once daily, #90, 3 refills   Rosuvastatin '20mg'$  once daily, #90, 3 refills    Initiate moderate intensity  statin with reduced frequency if prior  statin intolerance 1x weekly, #13, 3 refills   2x weekly, #26, 3 refills   3x weekly, #39, 3 refills    Code for past statin intolerance  (required annually)   Provider Requirements:  Must asociate code during an office visit or telehealth encounter   Drug Induced Myopathy G72.0   Myalgia M79.1   Myositis, unspecified M60.9   Myopathy, unspecified G72.9   Rhabdomyolysis M62.82     Plan: Route note to PCP.  Elayne Guerin, PharmD, [facility] Clinical Pharmacist [phone]  ADDENDUM Note sent to provider prior to the 01/03/23 appt.  Elayne Guerin, PharmD, [place]  Clinical Pharmacist [phone]

## 2022-12-29 NOTE — Telephone Encounter (Signed)
I spoke with [place] and informed him of the message below.

## 2022-12-30 ENCOUNTER — Encounter: Payer: Self-pay | Admitting: *Deleted

## 2022-12-30 ENCOUNTER — Telehealth: Payer: Self-pay | Admitting: *Deleted

## 2022-12-30 ENCOUNTER — Telehealth: Payer: Self-pay | Admitting: Family Medicine

## 2022-12-30 NOTE — Telephone Encounter (Signed)
Matthew Pratt informed of verbal orders

## 2022-12-30 NOTE — Patient Outreach (Signed)
  Care Coordination   Initial Visit Note   12/30/2022 Name: Eliezer Khawaja MRN: [id number] DOB: 1950/12/26  Verlin Grills Stenner is a 72 y.o. year old male who sees Burchette, Alinda Sierras, MD for primary care. I spoke with  Verlin Grills Mcweeney by phone today.  What matters to the patients health and wellness today?  Cardiac management and navigating through his recovery    Goals Addressed               This Visit's Progress     Post-op hospital D/C (CAD management)) (pt-stated)        Care Coordination Interventions: Provided education on importance of blood pressure control in management of CAD Provided education on Importance of limiting foods high in cholesterol Counseled on importance of regular laboratory monitoring as prescribed Reviewed Importance of taking all medications as prescribed Reviewed Importance of attending all scheduled provider appointments Advised to report any changes in symptoms or exercise tolerance Advised patient to discuss pain medication, time limit on driving based upon post-op instructions with provider Screening for signs and symptoms of depression related to chronic disease state Assessed social determinant of health barriers Educated on care management services related to utilizing nurse care manager, pharmacy and social workers. Pt receptive to care management program and agreed to the discussed plan of care.  Incisional site continues to heal with no reported issues and pt is aware on who to call with any encountered issues.          SDOH assessments and interventions completed:  Yes  SDOH Interventions Today    Flowsheet Row Most Recent Value  SDOH Interventions   Food Insecurity Interventions Intervention Not Indicated  Housing Interventions Intervention Not Indicated  Transportation Interventions Patient Refused  [Pt refused transportation resources at this time.]  Utilities Interventions Intervention Not Indicated        Care  Coordination Interventions:  Yes, provided   Follow up plan: Follow up call scheduled for 01/21/2023 @ 0900    Encounter Outcome:  Pt. Visit Completed   Raina Mina, RN Care Management Coordinator [place] Office [phone]

## 2022-12-30 NOTE — Telephone Encounter (Signed)
Nursing cardiac care 1x6

## 2022-12-30 NOTE — Patient Instructions (Signed)
Visit Information  Thank you for taking time to visit with me today. Please don't hesitate to contact me if I can be of assistance to you.   Following are the goals we discussed today:   Goals Addressed               This Visit's Progress     Post-op hospital D/C (CAD management)) (pt-stated)        Care Coordination Interventions: Provided education on importance of blood pressure control in management of CAD Provided education on Importance of limiting foods high in cholesterol Counseled on importance of regular laboratory monitoring as prescribed Reviewed Importance of taking all medications as prescribed Reviewed Importance of attending all scheduled provider appointments Advised to report any changes in symptoms or exercise tolerance Advised patient to discuss pain medication, time limit on driving based upon post-op instructions with provider Screening for signs and symptoms of depression related to chronic disease state Assessed social determinant of health barriers Educated on care management services related to utilizing nurse care manager, pharmacy and social workers. Pt receptive to care management program and agreed to the discussed plan of care.  Incisional site continues to heal with no reported issues and pt is aware on who to call with any encountered issues.          Our next appointment is by telephone on 01/21/2023 at 0900  Please call the care guide team at [phone] if you need to cancel or reschedule your appointment.   If you are experiencing a Mental Health or [place] or need someone to talk to, please call the Suicide and Crisis Lifeline: 988 call the [place] National Suicide Prevention Lifeline: (380)371-9725 or TTY: 8381424768 TTY (223) 172-0975) to talk to a trained counselor call 1-800-273-TALK (toll free, 24 hour hotline)  Patient verbalizes understanding of instructions and care plan provided today and agrees to view in [place].  Active MyChart status and patient understanding of how to access instructions and care plan via MyChart confirmed with patient.     Raina Mina, RN Care Management Coordinator [place] Office [phone]

## 2023-01-02 NOTE — Progress Notes (Unsigned)
Cardiology Office Note Date:  01/02/2023  Patient ID:  Matthew Pratt, DOB 29-Jan-1951, MRN [id number] PCP:  Matthew Post, MD  Cardiologist:  Dr. Margaretann Loveless Electrophysiologist: Dr. Lovena Le  ***refresh   Chief Complaint: *** wound check  History of Present Illness: Matthew Pratt is a 72 y.o. male with history of CAD (prior STEMI 2011 w/PCI to OM1), BPH, HLD, OSA w/CPAP    Admitted 12/15/22 after a witnessed collapse, by chart record, started CPR immediately.  Did CPR for 8 minutes and delivered 3 defibrillatory shocks. By the time EMS had arrived, ROSC had been achieved. Intubated in the field Presenting HS Trop only 60 EMS reviewed Pt on their arrival was unresponsive though had pulse and BP Reported AED defibrillations x3 prior to their arrival No ACLS meds LHC noted a CTO of the OM, with collaterals and non-obstructive disease otherwise, TTE noted preserved LVEF  He was extubated, off pressors, developed bradycardia with back to back V pausing/standstill events w/CHB 11.4 sec > 5.1 0 seconds  Initially with Pratt arrest encephalopathy that did eventually improve Underwent ICD implant 12/20/22 Discharged 12/21/22  *** site *** restrictions *** no driving 6 mo *** cards f/u 01/06/23   Device information Abbot dual chamber ICD implanted 12/20/22 Secondary prevention   Past Medical History:  Diagnosis Date   Anxiety    Arthritis    BPH (benign prostatic hypertrophy)    CAD (coronary artery disease)    s/p STEMI 10/15/10 w/ Promus DES placed x1 in the first OM   Colon polyps    Complication of anesthesia    pt. reported 2015 penile inplant when getting some anesthesia had chest pain,they readjusted  the meds. and continued with surgery. Had no further problems di not have to follow up with anyone.   Depression    Depression with anxiety    Dupuytren's contracture of hand    ED (erectile dysfunction)    GERD (gastroesophageal reflux disease)    Glaucoma    HLD  (hyperlipidemia)    Hypocontractile bladder    Hypothyroidism    MI (myocardial infarction) ([facility])    denies   OSA on CPAP    Peyronie's disease    Rosacea    Skin cancer    BCC    Past Surgical History:  Procedure Laterality Date   CHOLECYSTECTOMY N/A 02/10/2021   Procedure: LAPAROSCOPIC CHOLECYSTECTOMY;  Surgeon: Coralie Keens, MD;  Location: [facility];  Service: General;  Laterality: N/A;   COLONOSCOPY WITH ESOPHAGOGASTRODUODENOSCOPY (EGD)     multiple   CORONARY STENT PLACEMENT     ICD IMPLANT N/A 12/20/2022   Procedure: ICD IMPLANT;  Surgeon: Evans Lance, MD;  Location: [facility];  Service: Cardiovascular;  Laterality: N/A;   INNER EAR SURGERY     LAPAROSCOPIC CHOLECYSTECTOMY Bilateral 02/09/2021   Dr.blackmon   LASIK  02/1998   LEFT HEART CATH AND CORONARY ANGIOGRAPHY N/A 12/15/2022   Procedure: LEFT HEART CATH AND CORONARY ANGIOGRAPHY;  Surgeon: Leonie Man, MD;  Location: [facility];  Service: Cardiovascular;  Laterality: N/A;   PENILE PROSTHESIS IMPLANT     TONSILECTOMY, ADENOIDECTOMY, BILATERAL MYRINGOTOMY AND TUBES     TONSILLECTOMY     VASECTOMY     WISDOM TOOTH EXTRACTION      Current Outpatient Medications  Medication Sig Dispense Refill   acetaminophen (TYLENOL) 500 MG tablet Take 500 mg by mouth every 6 (six) hours as needed for mild pain.  aspirin EC 81 MG tablet Take 81 mg by mouth every morning.     Digestive Enzymes (SUPER ENZYMES PO) Take 1 capsule by mouth daily.     ezetimibe (ZETIA) 10 MG tablet Take 10 mg by mouth daily.     metoprolol succinate (TOPROL-XL) 25 MG 24 hr tablet Take 1 tablet (25 mg total) by mouth daily. 30 tablet 0   nitroGLYCERIN (NITROSTAT) 0.4 MG SL tablet Place 1 tablet (0.4 mg total) under the tongue every 5 (five) minutes as needed for chest pain. 25 tablet 1   Selenium 100 MCG TABS Take 100 mcg by mouth daily.     senna (SENOKOT) 8.6 MG tablet Take 1 tablet by mouth as needed for constipation.      SYNTHROID 125 MCG tablet TAKE 1 TABLET BY MOUTH EVERY DAY 90 tablet 0   timolol (TIMOPTIC) 0.5 % ophthalmic solution Place 1 drop into both eyes 2 (two) times daily.     traZODone (DESYREL) 50 MG tablet TAKE 1/2 TO 1 TABLET BY MOUTH AT NIGHT AS NEEDED FOR INSOMNIA (Patient taking differently: Take 25-50 mg by mouth at bedtime as needed (insomnia).) 90 tablet 0   TURMERIC PO Take 1,500 mg by mouth daily.     venlafaxine XR (EFFEXOR-XR) 37.5 MG 24 hr capsule TAKE 3 CAPSULES BY MOUTH EVERY DAY (Patient taking differently: Take 112.5 mg by mouth daily.) 270 capsule 3   zinc gluconate 50 MG tablet Take 50 mg by mouth daily at 6 PM.     No current facility-administered medications for this visit.    Allergies:   Bee venom, Crestor [rosuvastatin], Iodinated contrast media, Sulfa antibiotics, and Sulfonamide derivatives   Social History:  The patient  reports that he has never smoked. He has never used smokeless tobacco. He reports that he does not drink alcohol and does not use drugs.   Family History:  The patient's family history includes Arthritis in his paternal grandfather; Basal cell carcinoma in his father and sister; COPD in his mother and another family member; Cancer in his father; Colon cancer in his paternal grandfather and paternal grandmother; Heart failure in his maternal grandfather; Liver cancer in his father; Melanoma in his sister; Rheum arthritis in his maternal grandmother; Varicose Veins in his father.  ROS:  Please see the history of present illness.    All other systems are reviewed and otherwise negative.   PHYSICAL EXAM:  VS:  There were no vitals taken for this visit. BMI: There is no height or weight on file to calculate BMI. Well nourished, well developed, in no acute distress HEENT: normocephalic, atraumatic Neck: no JVD, carotid bruits or masses Cardiac:  *** RRR; no significant murmurs, no rubs, or gallops Lungs:  *** CTA b/l, no wheezing, rhonchi or rales Abd:  soft, nontender MS: no deformity or *** atrophy Ext: *** no edema Skin: warm and dry, no rash Neuro:  No gross deficits appreciated Psych: euthymic mood, full affect  *** ICD site is stable, no tethering or discomfort   EKG:  not done today  Device interrogation done today and reviewed by myself:  ***    12/15/22: TTE 1. Left ventricular ejection fraction, by estimation, is 55%. The left  ventricle has low normal function. The left ventricle demonstrates  regional wall motion abnormalities (see scoring diagram/findings for  description). Left ventricular diastolic  parameters were normal.   2. Right ventricular systolic function is mildly reduced. The right  ventricular size is mildly enlarged. Tricuspid regurgitation signal  is  inadequate for assessing PA pressure.   3. Right atrial size was mildly dilated.   4. The mitral valve is grossly normal. Mild mitral valve regurgitation.  No evidence of mitral stenosis.   5. The aortic valve is grossly normal. There is mild thickening of the  aortic valve. Aortic valve regurgitation is not visualized. No aortic  stenosis is present.   6. The inferior vena cava is normal in size with greater than 50%  respiratory variability, suggesting right atrial pressure of 3 mmHg.        12/15/22: LHC  Prox LAD to Mid LAD lesion is 55% stenosed.  Dist LAD lesion is 45% stenosed.   Prox Cx to Mid Cx lesion is 55% stenosed.   1st Mrg (previously placed Drug Eluting stent is 100% stenosed -> CTO with left to left and right left lateral   Prox RCA lesion is 60% stenosed. Mid RCA lesion is 55% stenosed.   LV end diastolic pressure is low.   There is no aortic valve stenosis.   Pratt-CATH DIAGNOSES Diffuse moderate to severe disease with calcification in all the proximal vessels, and likely CTO of previous OM1 stent filling via collaterals from both left to right and right to left. No obvious culprit lesion to explain cardiac arrest. Low LVEDP of  6 mmHg. A-line pressures were in the high 80s to low 90s compared to cuff . Hypokalemia noted on labs-potassium 2.9.  Was started on IV potassium 10 mg once    Recent Labs: 02/22/2022: TSH 3.82 12/17/2022: ALT 107; Magnesium 1.9 12/21/2022: BUN 20; Creatinine, Ser 1.10; Hemoglobin 13.9; Platelets 152; Potassium 4.2; Sodium 136  12/18/2022: Cholesterol 170; HDL 45; LDL Cholesterol 100; Total CHOL/HDL Ratio 3.8; Triglycerides 123; VLDL 25   Estimated Creatinine Clearance: 67.6 mL/min (by C-G formula based on SCr of 1.1 mg/dL).   Wt Readings from Last 3 Encounters:  12/20/22 185 lb 3 oz (84 kg)  12/15/22 190 lb (86.2 kg)  08/13/22 190 lb 6.4 oz (86.4 kg)     Other studies reviewed: Additional studies/records reviewed today include: summarized above  ASSESSMENT AND PLAN:  ICD ***  Cardiac arrest *** No driving 6 months  CAD *** C/w Dr. Heidi Dach  Disposition: F/u with ***  Current medicines are reviewed at length with the patient today.  The patient did not have any concerns regarding medicines.  Venetia Night, PA-C 01/02/2023 2:23 PM     [address] [place] [address] [phone] (office)  [phone] (fax)

## 2023-01-03 ENCOUNTER — Ambulatory Visit (INDEPENDENT_AMBULATORY_CARE_PROVIDER_SITE_OTHER): Payer: Medicare Other | Admitting: Family Medicine

## 2023-01-03 ENCOUNTER — Encounter: Payer: Self-pay | Admitting: Family Medicine

## 2023-01-03 VITALS — BP 114/62 | HR 62 | Temp 98.4°F | Ht 73.0 in | Wt 195.8 lb

## 2023-01-03 DIAGNOSIS — I442 Atrioventricular block, complete: Secondary | ICD-10-CM

## 2023-01-03 DIAGNOSIS — R0789 Other chest pain: Secondary | ICD-10-CM | POA: Diagnosis not present

## 2023-01-03 DIAGNOSIS — G720 Drug-induced myopathy: Secondary | ICD-10-CM | POA: Diagnosis not present

## 2023-01-03 DIAGNOSIS — T466X5A Adverse effect of antihyperlipidemic and antiarteriosclerotic drugs, initial encounter: Secondary | ICD-10-CM | POA: Diagnosis not present

## 2023-01-03 DIAGNOSIS — E785 Hyperlipidemia, unspecified: Secondary | ICD-10-CM | POA: Diagnosis not present

## 2023-01-03 DIAGNOSIS — I469 Cardiac arrest, cause unspecified: Secondary | ICD-10-CM | POA: Diagnosis not present

## 2023-01-03 DIAGNOSIS — E039 Hypothyroidism, unspecified: Secondary | ICD-10-CM

## 2023-01-03 MED ORDER — HYDROCODONE-ACETAMINOPHEN 5-325 MG PO TABS: 1.0000 | ORAL_TABLET | Freq: Four times a day (QID) | ORAL | 0 refills | Status: DC | PRN

## 2023-01-03 NOTE — Progress Notes (Signed)
Established Patient Office Visit  Subjective   Patient ID: Matthew Pratt, male    DOB: 29-Apr-1951  Age: 72 y.o. MRN: [id number]  Chief Complaint  Patient presents with   Hospitalization Follow-up    HPI   Matthew Pratt is seen following recent hospitalization after witnessed out of hospital cardiac arrest.  Patient had been seen by cardiology on the 10th and was asymptomatic and later that day was at home and apparently collapsed.  [place] apparently [place] and someone witnessed his collapse and probably administered CPR.  Return of spontaneous circulation was achieved after 9 minutes.  He was admitted to ICU.  Apparently had episode of complete heart block and ICU.  No PE.  Troponin rose to 910.  Left ventricular ejection fraction by echo 55%.  No aortic stenosis.  Cardiac catheterization revealed diffuse moderate to severe disease without any obvious culprit lesion to explain cardiac arrest.  He had pacemaker and ICD placed on the 15th.  He had history of STEMI 2011 with stent obtuse marginal branch at that time.  History of intolerance to multiple statins.  He also apparently did not tolerate Repatha but cannot recall exact details.  He has scheduled follow-up with lipid clinic through cardiology.  There have been some discussion previously of using Inclisiran.  He states he is not taking Zetia.  He is on aspirin.  Also started on beta-blocker.  No chest pain since discharge.  No further syncope.  His main complaint is chest wall soreness from CPR.  He has tried Tylenol without relief.  Severe night pain.  Pending follow-up with cardiology.  He was also told not to drive for 6 months.  He had some significant transportation issues.  Was brought here by his sister lives in [place] today.  Other labs reviewed.  Recent A1c 5.3%.  He does have home PT and nursing set up currently.  Past Medical History:  Diagnosis Date   Anxiety    Arthritis    BPH (benign prostatic  hypertrophy)    CAD (coronary artery disease)    s/p STEMI 10/15/10 w/ Promus DES placed x1 in the first OM   Colon polyps    Complication of anesthesia    pt. reported 2015 penile inplant when getting some anesthesia had chest pain,they readjusted  the meds. and continued with surgery. Had no further problems di not have to follow up with anyone.   Depression    Depression with anxiety    Dupuytren's contracture of hand    ED (erectile dysfunction)    GERD (gastroesophageal reflux disease)    Glaucoma    HLD (hyperlipidemia)    Hypocontractile bladder    Hypothyroidism    MI (myocardial infarction) ([facility])    denies   OSA on CPAP    Peyronie's disease    Rosacea    Skin cancer    BCC   Past Surgical History:  Procedure Laterality Date   CHOLECYSTECTOMY N/A 02/10/2021   Procedure: LAPAROSCOPIC CHOLECYSTECTOMY;  Surgeon: Coralie Keens, MD;  Location: [facility];  Service: General;  Laterality: N/A;   COLONOSCOPY WITH ESOPHAGOGASTRODUODENOSCOPY (EGD)     multiple   CORONARY STENT PLACEMENT     ICD IMPLANT N/A 12/20/2022   Procedure: ICD IMPLANT;  Surgeon: Evans Lance, MD;  Location: [facility];  Service: Cardiovascular;  Laterality: N/A;   INNER EAR SURGERY     LAPAROSCOPIC CHOLECYSTECTOMY Bilateral 02/09/2021   Dr.blackmon   LASIK  02/1998   LEFT HEART CATH AND CORONARY ANGIOGRAPHY N/A 12/15/2022   Procedure: LEFT HEART CATH AND CORONARY ANGIOGRAPHY;  Surgeon: Leonie Man, MD;  Location: [facility];  Service: Cardiovascular;  Laterality: N/A;   PENILE PROSTHESIS IMPLANT     TONSILECTOMY, ADENOIDECTOMY, BILATERAL MYRINGOTOMY AND TUBES     TONSILLECTOMY     VASECTOMY     WISDOM TOOTH EXTRACTION      reports that he has never smoked. He has never used smokeless tobacco. He reports that he does not drink alcohol and does not use drugs. family history includes Arthritis in his paternal grandfather; Basal cell carcinoma in his father and sister; COPD in his  mother and another family member; Cancer in his father; Colon cancer in his paternal grandfather and paternal grandmother; Heart failure in his maternal grandfather; Liver cancer in his father; Melanoma in his sister; Rheum arthritis in his maternal grandmother; Varicose Veins in his father. Allergies  Allergen Reactions   Bee Venom Hives and Swelling   Crestor [Rosuvastatin] Other (See Comments)    Myalgia.     Iodinated Contrast Media Hives and Itching   Sulfa Antibiotics Rash   Sulfonamide Derivatives Rash    Review of Systems  Constitutional:  Positive for malaise/fatigue. Negative for chills and fever.  Eyes:  Negative for blurred vision.  Respiratory:  Negative for shortness of breath.   Cardiovascular:  Negative for chest pain.  Gastrointestinal:  Negative for abdominal pain.  Genitourinary:  Negative for dysuria.  Neurological:  Negative for dizziness, weakness and headaches.      Objective:     BP 114/62 (BP Location: Left Arm, Patient Position: Sitting, Cuff Size: Normal)   Pulse 62   Temp 98.4 F (36.9 C) (Oral)   Ht '6\' 1"'$  (1.854 m)   Wt 195 lb 12.8 oz (88.8 kg)   SpO2 98%   BMI 25.83 kg/m  BP Readings from Last 3 Encounters:  01/03/23 114/62  12/22/22 97/70  12/15/22 108/72   Wt Readings from Last 3 Encounters:  01/03/23 195 lb 12.8 oz (88.8 kg)  12/20/22 185 lb 3 oz (84 kg)  12/15/22 190 lb (86.2 kg)      Physical Exam Vitals reviewed.  Constitutional:      General: He is not in acute distress.    Appearance: He is well-developed.  Eyes:     Pupils: Pupils are equal, round, and reactive to light.  Neck:     Thyroid: No thyromegaly.  Cardiovascular:     Rate and Rhythm: Normal rate and regular rhythm.     Comments: Steri-Strips in place over wound left upper chest wall from recent ICD and pacemaker insertion Pulmonary:     Effort: Pulmonary effort is normal. No respiratory distress.     Breath sounds: Normal breath sounds. No wheezing or  rales.  Musculoskeletal:     Cervical back: Neck supple.     Right lower leg: No edema.     Left lower leg: No edema.  Neurological:     Mental Status: He is alert and oriented to person, place, and time.      No results found for any visits on 01/03/23.  Last CBC Lab Results  Component Value Date   WBC 9.1 12/21/2022   HGB 13.9 12/21/2022   HCT 39.1 12/21/2022   MCV 99.7 12/21/2022   MCH 35.5 (H) 12/21/2022   RDW 11.9 12/21/2022   PLT 152 46/65/9935   Last metabolic panel Lab Results  Component Value Date   GLUCOSE 160 (H) 12/21/2022   NA 136 12/21/2022   K 4.2 12/21/2022   CL 103 12/21/2022   CO2 25 12/21/2022   BUN 20 12/21/2022   CREATININE 1.10 12/21/2022   GFRNONAA >60 12/21/2022   CALCIUM 9.1 12/21/2022   PHOS 2.3 (L) 12/17/2022   PROT 5.9 (L) 12/17/2022   ALBUMIN 3.3 (L) 12/17/2022   BILITOT 1.4 (H) 12/17/2022   ALKPHOS 59 12/17/2022   AST 76 (H) 12/17/2022   ALT 107 (H) 12/17/2022   ANIONGAP 8 12/21/2022   Last lipids Lab Results  Component Value Date   CHOL 170 12/18/2022   HDL 45 12/18/2022   LDLCALC 100 (H) 12/18/2022   TRIG 123 12/18/2022   CHOLHDL 3.8 12/18/2022   Last hemoglobin A1c Lab Results  Component Value Date   HGBA1C 5.3 12/18/2022   Last thyroid functions Lab Results  Component Value Date   TSH 3.82 02/22/2022      The ASCVD Risk score (Arnett DK, et al., 2019) failed to calculate for the following reasons:   The patient has a prior MI or stroke diagnosis    Assessment & Plan:   #1 recent cardiac arrest in a patient with known history of CAD.  VF noted by EMS.  Stable ejection fraction.  Now has [employer] dual-chamber ICD.  Also had intermittent AV block with brief complete heart block during hospitalization.  On low-dose beta-blocker.  He was told not to drive for 6 months.  We have recommended he discuss this further with cardiology to get their input  #2 chest wall pain following CPR.  He is requesting pain  medication for that.  We wrote for limited hydrocodone 5/325 mg 1 every 6 hours as needed for severe pain #20 with no refill.  He is cautioned about potential side effects including constipation, dizziness, sedation  #3 hyperlipidemia.  Intolerance to statins and apparently also did not tolerate Repatha.  He has pending follow-up with pharmacist with cardiology to discuss other potential treatment options.  #4 hypothyroidism managed by endocrinology  40 minutes were spent in face-to-face and non-face-to-face time reviewing recent hospitalization events discussing his acute and chronic needs.  [name], MD

## 2023-01-04 ENCOUNTER — Ambulatory Visit: Payer: Medicare Other | Admitting: Family Medicine

## 2023-01-04 ENCOUNTER — Ambulatory Visit: Payer: Medicare Other | Admitting: Physician Assistant

## 2023-01-04 ENCOUNTER — Ambulatory Visit: Payer: Medicare Other | Attending: Cardiology | Admitting: Pharmacist Clinician (PhC)/ Clinical Pharmacy Specialist

## 2023-01-04 ENCOUNTER — Encounter: Payer: Self-pay | Admitting: Physician Assistant

## 2023-01-04 VITALS — BP 120/78 | HR 98 | Ht 73.0 in | Wt 197.8 lb

## 2023-01-04 DIAGNOSIS — I469 Cardiac arrest, cause unspecified: Secondary | ICD-10-CM

## 2023-01-04 DIAGNOSIS — I2510 Atherosclerotic heart disease of native coronary artery without angina pectoris: Secondary | ICD-10-CM | POA: Diagnosis not present

## 2023-01-04 DIAGNOSIS — E785 Hyperlipidemia, unspecified: Secondary | ICD-10-CM

## 2023-01-04 DIAGNOSIS — Z95810 Presence of automatic (implantable) cardiac defibrillator: Secondary | ICD-10-CM

## 2023-01-04 DIAGNOSIS — Z5189 Encounter for other specified aftercare: Secondary | ICD-10-CM | POA: Diagnosis not present

## 2023-01-04 LAB — CUP PACEART INCLINIC DEVICE CHECK
Battery Remaining Longevity: 78 mo
Brady Statistic RA Percent Paced: 66 %
Brady Statistic RV Percent Paced: 0.02 %
Date Time Interrogation Session: 20240130171714
HighPow Impedance: 59.625
Implantable Lead Connection Status: 753985
Implantable Lead Connection Status: 753985
Implantable Lead Implant Date: 20240115
Implantable Lead Implant Date: 20240115
Implantable Lead Location: 753859
Implantable Lead Location: 753860
Implantable Lead Model: 7122
Implantable Pulse Generator Implant Date: 20240115
Lead Channel Impedance Value: 475 Ohm
Lead Channel Impedance Value: 512.5 Ohm
Lead Channel Pacing Threshold Amplitude: 0.5 V
Lead Channel Pacing Threshold Amplitude: 0.5 V
Lead Channel Pacing Threshold Amplitude: 0.5 V
Lead Channel Pacing Threshold Amplitude: 0.5 V
Lead Channel Pacing Threshold Pulse Width: 0.5 ms
Lead Channel Pacing Threshold Pulse Width: 0.5 ms
Lead Channel Pacing Threshold Pulse Width: 0.5 ms
Lead Channel Pacing Threshold Pulse Width: 0.5 ms
Lead Channel Sensing Intrinsic Amplitude: 5 mV
Lead Channel Sensing Intrinsic Amplitude: 8 mV
Lead Channel Setting Pacing Amplitude: 3.5 V
Lead Channel Setting Pacing Amplitude: 3.5 V
Lead Channel Setting Pacing Pulse Width: 0.5 ms
Lead Channel Setting Sensing Sensitivity: 0.5 mV
Pulse Gen Serial Number: 8956372

## 2023-01-04 NOTE — Assessment & Plan Note (Signed)
Assessment: Last LDL was uncontrolled at 100 mg/dl; goal <70 due to previous ASCVD Patient unable to tolerate ezetimibe and statins due to joint pain and increased LFTs Patient previously unable to tolerate Repatha due to tiredness but is willing to try it again Reviewed options for lowering LDL cholesterol including PCSK-9 inhibitors and inclisiran. Discussed mechanisms of action, dosing, side effects, potential decreases in LDL cholesterol and costs. Also reviewed potential options for patient assistance.  Plan: Patient agreeable to starting the process of getting inclisiran [place] Downer) covered by insurance  If insurance does not cover Leqvio or if cost is too much, patient is willing to try Repatha again Repeat cholesterol labs 2-3 months after cholesterol medication is started Follow up with Dr. Margaretann Loveless at next cardiology visit

## 2023-01-04 NOTE — Progress Notes (Signed)
Office Visit    Patient Name: Matthew Pratt Date of Encounter: 01/04/2023  Primary Care Provider:  [name], MD Primary Cardiologist:  Elouise Munroe, MD  Chief Complaint    Hyperlipidemia   Significant Past Medical History   ASCVD LDL 100; ASA 81 mg daily  OSA on CPAP  Hypothyroidism Synthroid 125 mcg daily   Heart Block metoprolol succinate 25 mg daily      Allergies  Allergen Reactions   Bee Venom Hives and Swelling   Crestor [Rosuvastatin] Other (See Comments)    Myalgia.     Iodinated Contrast Media Hives and Itching   Sulfa Antibiotics Rash   Sulfonamide Derivatives Rash    History of Present Illness    Matthew Pratt is a 72 y.o. male patient of Dr Dr Margaretann Loveless. On 12/15/21 he was admitted for cardiac arrest (CPR administered by a bystander) and a heart cath was done (results below). In the hospital he had a complete heart block, pacemaker and ICD were placed on 12/20/21. He has a history of STEMI with PCI and DES to first OM in 2011. He is intolerant to ezetimibe and multiple statins (atorvastatin and rosuvastatin) due to joint pain and increased LFTs. In 2022 he was started on Repatha and stopped taking it after 2 months due to unusual tiredness and weakness. He reports that he no longer had to take naps to compensate for his tiredness after he stopped taking Repatha. The Repatha did lower his LDL from 168 to 64. His current LDL is 100 without any cholesterol medication. Patient reports that he is still very sore in the sternum area after chest compressions. He also says that he experienced short-term memory loss after being in the ICU. After everything he has been through he is willing to try Repatha again and consider other options.   Heart cath results: Prox LAD to Mid LAD lesion is 55% stenosed.  Dist LAD lesion is 45% stenosed. Prox Cx to Mid Cx lesion is 55% stenosed. 1st Mrg (previously placed Drug Eluting stent is 100% stenosed -> CTO with  left to left and right left lateral Prox RCA lesion is 60% stenosed. Mid RCA lesion is 55% stenosed.  Current Medications: none  Adherence Assessment  Do you ever forget to take your medication?  '[]'$ Yes '[x]'$ No  Do you ever skip doses due to side effects?  '[]'$ Yes '[x]'$ No  Do you have trouble affording your medicines? '[]'$ Yes '[]'$ No  Are you ever unable to pick up your medication due to transportation difficulties? '[]'$ Yes '[]'$ No  Do you ever stop taking your medications because you don't believe they are helping? '[]'$ Yes '[]'$ No  Do you check your weight daily? '[]'$ Yes '[]'$ No   Previously tried:    Atorvastatin, rosuvastatin - myalgia, increase in LFTs to > 3x UNL Ezetimibe Repatha - Unusual tiredness or weakness at the 2 months medication interval (napping to compensate - 2 naps per day, 45 min.)  Family Hx: no family history of MI; cancer more of family history (dad at 81, mom died COPD/smoker), siblings mostly healthy (2 sisters), 2 sons don't know much  Diet: avoids fats (butters, margarines), decrease in salad dressings; eats plenty of salad, all proteins (avoids pork); avoids snacking, tries to stay away from ice cream  Social Hx:     Tobacco: none Alcohol: none  Exercise: recumbent exercise bike 40-60 minutes/day 4/5 days per week; warmer weather walks 2.2 miles per day  Accessory Clinical Findings   Lab Results  Component  Value Date   CHOL 170 12/18/2022   HDL 45 12/18/2022   LDLCALC 100 (H) 12/18/2022   TRIG 123 12/18/2022   CHOLHDL 3.8 12/18/2022    Lab Results  Component Value Date   ALT 107 (H) 12/17/2022   AST 76 (H) 12/17/2022   ALKPHOS 59 12/17/2022   BILITOT 1.4 (H) 12/17/2022   Lab Results  Component Value Date   CREATININE 1.10 12/21/2022   BUN 20 12/21/2022   NA 136 12/21/2022   K 4.2 12/21/2022   CL 103 12/21/2022   CO2 25 12/21/2022   Lab Results  Component Value Date   HGBA1C 5.3 12/18/2022    Home Medications    Current Outpatient Medications   Medication Sig Dispense Refill   acetaminophen (TYLENOL) 500 MG tablet Take 500 mg by mouth every 6 (six) hours as needed for mild pain.     aspirin EC 81 MG tablet Take 81 mg by mouth every morning.     Digestive Enzymes (SUPER ENZYMES PO) Take 1 capsule by mouth daily.     HYDROcodone-acetaminophen (NORCO/VICODIN) 5-325 MG tablet Take 1 tablet by mouth every 6 (six) hours as needed for moderate pain. 20 tablet 0   metoprolol succinate (TOPROL-XL) 25 MG 24 hr tablet Take 1 tablet (25 mg total) by mouth daily. 30 tablet 0   nitroGLYCERIN (NITROSTAT) 0.4 MG SL tablet Place 1 tablet (0.4 mg total) under the tongue every 5 (five) minutes as needed for chest pain. 25 tablet 1   Selenium 100 MCG TABS Take 100 mcg by mouth daily.     senna (SENOKOT) 8.6 MG tablet Take 1 tablet by mouth as needed for constipation.     SYNTHROID 125 MCG tablet TAKE 1 TABLET BY MOUTH EVERY DAY 90 tablet 0   timolol (TIMOPTIC) 0.5 % ophthalmic solution Place 1 drop into both eyes 2 (two) times daily.     traZODone (DESYREL) 50 MG tablet TAKE 1/2 TO 1 TABLET BY MOUTH AT NIGHT AS NEEDED FOR INSOMNIA (Patient taking differently: Take 25-50 mg by mouth at bedtime as needed (insomnia).) 90 tablet 0   TURMERIC PO Take 1,500 mg by mouth daily.     venlafaxine XR (EFFEXOR-XR) 37.5 MG 24 hr capsule TAKE 3 CAPSULES BY MOUTH EVERY DAY (Patient taking differently: Take 112.5 mg by mouth daily.) 270 capsule 3   zinc gluconate 50 MG tablet Take 50 mg by mouth daily at 6 PM.     No current facility-administered medications for this visit.     Assessment & Plan    Hyperlipidemia Assessment: Last LDL was uncontrolled at 100 mg/dl; goal <70 due to previous ASCVD Patient unable to tolerate ezetimibe and statins due to joint pain and increased LFTs Patient previously unable to tolerate Repatha due to tiredness but is willing to try it again Reviewed options for lowering LDL cholesterol including PCSK-9 inhibitors and inclisiran.  Discussed mechanisms of action, dosing, side effects, potential decreases in LDL cholesterol and costs. Also reviewed potential options for patient assistance.  Plan: Patient agreeable to starting the process of getting inclisiran Marion Downer) covered by insurance  If insurance does not cover Leqvio or if cost is too much, patient is willing to try Repatha again Repeat cholesterol labs 2-3 months after cholesterol medication is started Follow up with Dr. Margaretann Loveless at next cardiology visit  [employer]  Class of 2024  I was with student and patient for entire appointment and agree with above  assessment and plan.  Tommy Medal, PharmD CPP [facility] [address] [place], [place] [address] [phone]  01/04/2023, 11:45 AM

## 2023-01-04 NOTE — Patient Instructions (Signed)
Medication Instructions:   Your physician recommends that you continue on your current medications as directed. Please refer to the Current Medication list given to you today.  *If you need a refill on your cardiac medications before your next appointment, please call your pharmacy*   Lab Work:  NONE ORDERED  TODAY    If you have labs (blood work) drawn today and your tests are completely normal, you will receive your results only by: MyChart Message (if you have MyChart) OR A paper copy in the mail If you have any lab test that is abnormal or we need to change your treatment, we will call you to review the results.   Testing/Procedures:  NONE ORDERED  TODAY   Follow-Up: At [facility], you and your health needs are our priority.  As part of our continuing mission to provide you with exceptional heart care, we have created designated Provider Care Teams.  These Care Teams include your primary Cardiologist (physician) and Advanced Practice Providers (APPs -  Physician Assistants and Nurse Practitioners) who all work together to provide you with the care you need, when you need it.  We recommend signing up for the patient portal called "MyChart".  Sign up information is provided on this After Visit Summary.  MyChart is used to connect with patients for Virtual Visits (Telemedicine).  Patients are able to view lab/test results, encounter notes, upcoming appointments, etc.  Non-urgent messages can be sent to your provider as well.   To learn more about what you can do with MyChart, go to [email or web address].    Your next appointment:   AS SCHEDULED   Provider:   Gregg Taylor, MD   Other Instructions   

## 2023-01-04 NOTE — Patient Instructions (Addendum)
Your Results:             Your most recent labs Goal  Total Cholesterol 170 < 200  Triglycerides 123 < 150  HDL (happy/good cholesterol) 45 > 40  LDL (lousy/bad cholesterol 100 < 70   Medication changes:  We will start the process to get Leqvio (inclisiran) covered by your insurance.  Once the prior authorization is complete, I will call/send a MyChart message to let you know and confirm pharmacy information.   You will get one injection on day 1, then one 90 days later.  After that you will get one injection every 6 months.    Lab orders:  We want to repeat labs after 2-3 months.  We will send you a lab order to remind you once we get closer to that time.                    Thank you for choosing [facility]

## 2023-01-05 ENCOUNTER — Ambulatory Visit: Payer: Medicare Other | Admitting: Family Medicine

## 2023-01-05 DIAGNOSIS — I252 Old myocardial infarction: Secondary | ICD-10-CM | POA: Diagnosis not present

## 2023-01-05 DIAGNOSIS — H409 Unspecified glaucoma: Secondary | ICD-10-CM | POA: Diagnosis not present

## 2023-01-05 DIAGNOSIS — M8580 Other specified disorders of bone density and structure, unspecified site: Secondary | ICD-10-CM | POA: Diagnosis not present

## 2023-01-05 DIAGNOSIS — M4312 Spondylolisthesis, cervical region: Secondary | ICD-10-CM | POA: Diagnosis not present

## 2023-01-05 DIAGNOSIS — M47812 Spondylosis without myelopathy or radiculopathy, cervical region: Secondary | ICD-10-CM | POA: Diagnosis not present

## 2023-01-05 DIAGNOSIS — K219 Gastro-esophageal reflux disease without esophagitis: Secondary | ICD-10-CM | POA: Diagnosis not present

## 2023-01-05 DIAGNOSIS — K2270 Barrett's esophagus without dysplasia: Secondary | ICD-10-CM | POA: Diagnosis not present

## 2023-01-05 DIAGNOSIS — I442 Atrioventricular block, complete: Secondary | ICD-10-CM | POA: Diagnosis not present

## 2023-01-05 DIAGNOSIS — M720 Palmar fascial fibromatosis [Dupuytren]: Secondary | ICD-10-CM | POA: Diagnosis not present

## 2023-01-05 DIAGNOSIS — I255 Ischemic cardiomyopathy: Secondary | ICD-10-CM | POA: Diagnosis not present

## 2023-01-05 DIAGNOSIS — L719 Rosacea, unspecified: Secondary | ICD-10-CM | POA: Diagnosis not present

## 2023-01-05 DIAGNOSIS — G4733 Obstructive sleep apnea (adult) (pediatric): Secondary | ICD-10-CM | POA: Diagnosis not present

## 2023-01-05 DIAGNOSIS — E039 Hypothyroidism, unspecified: Secondary | ICD-10-CM | POA: Diagnosis not present

## 2023-01-05 DIAGNOSIS — I2510 Atherosclerotic heart disease of native coronary artery without angina pectoris: Secondary | ICD-10-CM | POA: Diagnosis not present

## 2023-01-05 DIAGNOSIS — K589 Irritable bowel syndrome without diarrhea: Secondary | ICD-10-CM | POA: Diagnosis not present

## 2023-01-05 DIAGNOSIS — Z86010 Personal history of colonic polyps: Secondary | ICD-10-CM | POA: Diagnosis not present

## 2023-01-05 DIAGNOSIS — E785 Hyperlipidemia, unspecified: Secondary | ICD-10-CM | POA: Diagnosis not present

## 2023-01-05 DIAGNOSIS — G931 Anoxic brain damage, not elsewhere classified: Secondary | ICD-10-CM | POA: Diagnosis not present

## 2023-01-05 DIAGNOSIS — Z48812 Encounter for surgical aftercare following surgery on the circulatory system: Secondary | ICD-10-CM | POA: Diagnosis not present

## 2023-01-05 DIAGNOSIS — K760 Fatty (change of) liver, not elsewhere classified: Secondary | ICD-10-CM | POA: Diagnosis not present

## 2023-01-05 DIAGNOSIS — H90A32 Mixed conductive and sensorineural hearing loss, unilateral, left ear with restricted hearing on the contralateral side: Secondary | ICD-10-CM | POA: Diagnosis not present

## 2023-01-05 DIAGNOSIS — Z7982 Long term (current) use of aspirin: Secondary | ICD-10-CM | POA: Diagnosis not present

## 2023-01-06 ENCOUNTER — Encounter: Payer: Self-pay | Admitting: Internal Medicine

## 2023-01-06 ENCOUNTER — Ambulatory Visit: Payer: Medicare Other | Attending: Nurse Practitioner | Admitting: Nurse Practitioner

## 2023-01-06 ENCOUNTER — Encounter: Payer: Self-pay | Admitting: Nurse Practitioner

## 2023-01-06 VITALS — BP 104/68 | HR 60 | Ht 73.0 in | Wt 196.8 lb

## 2023-01-06 DIAGNOSIS — Z95810 Presence of automatic (implantable) cardiac defibrillator: Secondary | ICD-10-CM | POA: Diagnosis not present

## 2023-01-06 DIAGNOSIS — I2510 Atherosclerotic heart disease of native coronary artery without angina pectoris: Secondary | ICD-10-CM | POA: Diagnosis not present

## 2023-01-06 DIAGNOSIS — I469 Cardiac arrest, cause unspecified: Secondary | ICD-10-CM

## 2023-01-06 DIAGNOSIS — E785 Hyperlipidemia, unspecified: Secondary | ICD-10-CM | POA: Diagnosis not present

## 2023-01-06 DIAGNOSIS — G4733 Obstructive sleep apnea (adult) (pediatric): Secondary | ICD-10-CM

## 2023-01-06 DIAGNOSIS — I442 Atrioventricular block, complete: Secondary | ICD-10-CM

## 2023-01-06 MED ORDER — METOPROLOL SUCCINATE ER 25 MG PO TB24: 25.0000 mg | ORAL_TABLET | Freq: Every day | ORAL | 3 refills | Status: DC

## 2023-01-06 MED ORDER — NITROGLYCERIN 0.4 MG SL SUBL: 0.4000 mg | SUBLINGUAL_TABLET | SUBLINGUAL | 3 refills | Status: DC | PRN

## 2023-01-06 NOTE — Patient Instructions (Signed)
Medication Instructions:  Your physician recommends that you continue on your current medications as directed. Please refer to the Current Medication list given to you today.   *If you need a refill on your cardiac medications before your next appointment, please call your pharmacy*   Lab Work: NONE ordered at this time of appointment   If you have labs (blood work) drawn today and your tests are completely normal, you will receive your results only by: New Site (if you have MyChart) OR A paper copy in the mail If you have any lab test that is abnormal or we need to change your treatment, we will call you to review the results.   Testing/Procedures: NONE ordered at this time of appointment     Follow-Up: At [facility], you and your health needs are our priority.  As part of our continuing mission to provide you with exceptional heart care, we have created designated Provider Care Teams.  These Care Teams include your primary Cardiologist (physician) and Advanced Practice Providers (APPs -  Physician Assistants and Nurse Practitioners) who all work together to provide you with the care you need, when you need it.  We recommend signing up for the patient portal called "MyChart".  Sign up information is provided on this After Visit Summary.  MyChart is used to connect with patients for Virtual Visits (Telemedicine).  Patients are able to view lab/test results, encounter notes, upcoming appointments, etc.  Non-urgent messages can be sent to your provider as well.   To learn more about what you can do with MyChart, go to [email or web address].    Your next appointment:   4-6 month(s)  Provider:   Elouise Munroe, MD     Other Instructions

## 2023-01-06 NOTE — Progress Notes (Signed)
Office Visit    Patient Name: Matthew Pratt Date of Encounter: 01/06/2023  Primary Care Provider:  [name], MD Primary Cardiologist:  Elouise Munroe, MD  Chief Complaint    72 year old male with a history of cardiac arrest s/p ICD in 12/2022, CAD s/p STEMI in 2011, DES-OM1, hyperlipidemia, OSA, and BPH who presents for hospital follow-up related to CAD and cardiac arrest s/p ICD.  Past Medical History    Past Medical History:  Diagnosis Date   Anxiety    Arthritis    BPH (benign prostatic hypertrophy)    CAD (coronary artery disease)    s/p STEMI 10/15/10 w/ Promus DES placed x1 in the first OM   Colon polyps    Complication of anesthesia    pt. reported 2015 penile inplant when getting some anesthesia had chest pain,they readjusted  the meds. and continued with surgery. Had no further problems di not have to follow up with anyone.   Depression    Depression with anxiety    Dupuytren's contracture of hand    ED (erectile dysfunction)    GERD (gastroesophageal reflux disease)    Glaucoma    HLD (hyperlipidemia)    Hypocontractile bladder    Hypothyroidism    MI (myocardial infarction) ([place])    denies   OSA on CPAP    Peyronie's disease    Rosacea    Skin cancer    BCC   Past Surgical History:  Procedure Laterality Date   CHOLECYSTECTOMY N/A 02/10/2021   Procedure: LAPAROSCOPIC CHOLECYSTECTOMY;  Surgeon: Coralie Keens, MD;  Location: [facility];  Service: General;  Laterality: N/A;   COLONOSCOPY WITH ESOPHAGOGASTRODUODENOSCOPY (EGD)     multiple   CORONARY STENT PLACEMENT     ICD IMPLANT N/A 12/20/2022   Procedure: ICD IMPLANT;  Surgeon: Evans Lance, MD;  Location: [place];  Service: Cardiovascular;  Laterality: N/A;   INNER EAR SURGERY     LAPAROSCOPIC CHOLECYSTECTOMY Bilateral 02/09/2021   Dr.[email or web address]   LASIK  02/1998   LEFT HEART CATH AND CORONARY ANGIOGRAPHY N/A 12/15/2022   Procedure: LEFT HEART CATH AND CORONARY ANGIOGRAPHY;   Surgeon: Leonie Man, MD;  Location: [place] [facility];  Service: Cardiovascular;  Laterality: N/A;   PENILE PROSTHESIS IMPLANT     TONSILECTOMY, ADENOIDECTOMY, BILATERAL MYRINGOTOMY AND TUBES     TONSILLECTOMY     VASECTOMY     WISDOM TOOTH EXTRACTION      Allergies  Allergies  Allergen Reactions   Bee Venom Hives and Swelling   Crestor [Rosuvastatin] Other (See Comments)    Myalgia.     Iodinated Contrast Media Hives and Itching   Sulfa Antibiotics Rash   Sulfonamide Derivatives Rash     Labs/Other Studies Reviewed    The following studies were reviewed today: Echo 12/15/2022: IMPRESSIONS   1. Left ventricular ejection fraction, by estimation, is 55%. The left  ventricle has low normal function. The left ventricle demonstrates  regional wall motion abnormalities (see scoring diagram/findings for  description). Left ventricular diastolic  parameters were normal.   2. Right ventricular systolic function is mildly reduced. The right  ventricular size is mildly enlarged. Tricuspid regurgitation signal is  inadequate for assessing PA pressure.   3. Right atrial size was mildly dilated.   4. The mitral valve is grossly normal. Mild mitral valve regurgitation.  No evidence of mitral stenosis.   5. The aortic valve is grossly normal. There is mild thickening of the  aortic valve. Aortic valve regurgitation is not visualized. No aortic  stenosis is present.   6. The inferior vena cava is normal in size with greater than 50%  respiratory variability, suggesting right atrial pressure of 3 mmHg.    LHC 12/15/2022:    Prox LAD to Mid LAD lesion is 55% stenosed.  Dist LAD lesion is 45% stenosed.   Prox Cx to Mid Cx lesion is 55% stenosed.   1st Mrg (previously placed Drug Eluting stent is 100% stenosed -> CTO with left to left and right left lateral   Prox RCA lesion is 60% stenosed. Mid RCA lesion is 55% stenosed.   LV end diastolic pressure is low.   There is no aortic  valve stenosis.   POST-CATH DIAGNOSES Diffuse moderate to severe disease with calcification in all the proximal vessels, and likely CTO of previous OM1 stent filling via collaterals from both left to right and right to left. No obvious culprit lesion to explain cardiac arrest. Low LVEDP of 6 mmHg. A-line pressures were in the high 80s to low 90s compared to cuff . Hypokalemia noted on labs-potassium 2.9.  Was started on IV potassium 10 mg once     RECOMMENDATION Monitor clinically for signs of recovery per PCCM. I suspect regional wall motion abnormality on echo was related to the CTO of the OM.  Glenetta Hew, MD   Recent Labs: 02/22/2022: TSH 3.82 12/17/2022: ALT 107; Magnesium 1.9 12/21/2022: BUN 20; Creatinine, Ser 1.10; Hemoglobin 13.9; Platelets 152; Potassium 4.2; Sodium 136  Recent Lipid Panel    Component Value Date/Time   CHOL 170 12/18/2022 0424   CHOL 163 03/25/2021 0954   TRIG 123 12/18/2022 0424   HDL 45 12/18/2022 0424   HDL 59 03/25/2021 0954   CHOLHDL 3.8 12/18/2022 0424   VLDL 25 12/18/2022 0424   LDLCALC 100 (H) 12/18/2022 0424   LDLCALC 86 03/25/2021 0954    History of Present Illness    72 year old male with the above past medical history including cardiac arrest s/p ICD in 12/2022, CAD s/p STEMI in 2011, DES-OM1, hyperlipidemia, OSA, and BPH.  He was admitted to the hospital on 12/15/2022 following a witnessed collapse.  CPR was initiated immediately and he underwent 8 minutes of CPR with 3 defibrillatory shocks, by the time EMS arrived, ROSC was achieved.  He was intubated in the field.  He underwent cardiac catheterization which revealed a CTO of the OM with collaterals, otherwise nonobstructive CAD.  Echocardiogram showed preserved LVEF.  He developed bradycardia with back-to-back VT pausing/standstill events with complete heart block.  He did experience some postarrest encephalopathy that improved.  He underwent ICD implantation on 12/20/2022.  He was  discharged home in stable addition 12/21/2022.  He was last seen in the office by EP on 01/04/2023 was stable from a cardiac standpoint.  He presents today for follow-up.  Since his last visit he has done well from a cardiac standpoint.  He does have some residual chest discomfort following CPR.  He is eager to resume exercise.  Overall, he reports feeling well.  Home Medications    Current Outpatient Medications  Medication Sig Dispense Refill   acetaminophen (TYLENOL) 500 MG tablet Take 500 mg by mouth every 6 (six) hours as needed for mild pain.     aspirin EC 81 MG tablet Take 81 mg by mouth every morning.     Digestive Enzymes (SUPER ENZYMES PO) Take 1 capsule by mouth daily.     HYDROcodone-acetaminophen (NORCO/VICODIN) 5-325  MG tablet Take 1 tablet by mouth every 6 (six) hours as needed for moderate pain. 20 tablet 0   Selenium 100 MCG TABS Take 100 mcg by mouth daily.     senna (SENOKOT) 8.6 MG tablet Take 1 tablet by mouth as needed for constipation.     SYNTHROID 125 MCG tablet TAKE 1 TABLET BY MOUTH EVERY DAY 90 tablet 0   timolol (TIMOPTIC) 0.5 % ophthalmic solution Place 1 drop into both eyes 2 (two) times daily.     traZODone (DESYREL) 50 MG tablet TAKE 1/2 TO 1 TABLET BY MOUTH AT NIGHT AS NEEDED FOR INSOMNIA (Patient taking differently: Take 25-50 mg by mouth at bedtime as needed (insomnia).) 90 tablet 0   TURMERIC PO Take 1,500 mg by mouth daily.     venlafaxine XR (EFFEXOR-XR) 37.5 MG 24 hr capsule TAKE 3 CAPSULES BY MOUTH EVERY DAY (Patient taking differently: Take 112.5 mg by mouth daily.) 270 capsule 3   zinc gluconate 50 MG tablet Take 50 mg by mouth daily at 6 PM.     metoprolol succinate (TOPROL-XL) 25 MG 24 hr tablet Take 1 tablet (25 mg total) by mouth daily. 90 tablet 3   nitroGLYCERIN (NITROSTAT) 0.4 MG SL tablet Place 1 tablet (0.4 mg total) under the tongue every 5 (five) minutes as needed for chest pain. 25 tablet 3   No current facility-administered medications for  this visit.     Review of Systems    He denies chest pain, palpitations, dyspnea, pnd, orthopnea, n, v, dizziness, syncope, edema, weight gain, or early satiety. All other systems reviewed and are otherwise negative except as noted above.   Physical Exam    VS:  BP 104/68 (BP Location: Left Arm, Patient Position: Sitting, Cuff Size: Normal)   Pulse 60   Ht '6\' 1"'$  (1.854 m)   Wt 196 lb 12.8 oz (89.3 kg)   SpO2 99%   BMI 25.96 kg/m  GEN: Well nourished, well developed, in no acute distress. HEENT: normal. Neck: Supple, no JVD, carotid bruits, or masses. Cardiac: RRR, no murmurs, rubs, or gallops. No clubbing, cyanosis, edema.  Radials/DP/PT 2+ and equal bilaterally.  Respiratory:  Respirations regular and unlabored, clear to auscultation bilaterally. GI: Soft, nontender, nondistended, BS + x 4. MS: no deformity or atrophy. Skin: warm and dry, no rash. Neuro:  Strength and sensation are intact. Psych: Normal affect.  Accessory Clinical Findings    ECG personally reviewed by me today -atrial paced, 60 bpm- no acute changes.   Lab Results  Component Value Date   WBC 9.1 12/21/2022   HGB 13.9 12/21/2022   HCT 39.1 12/21/2022   MCV 99.7 12/21/2022   PLT 152 12/21/2022   Lab Results  Component Value Date   CREATININE 1.10 12/21/2022   BUN 20 12/21/2022   NA 136 12/21/2022   K 4.2 12/21/2022   CL 103 12/21/2022   CO2 25 12/21/2022   Lab Results  Component Value Date   ALT 107 (H) 12/17/2022   AST 76 (H) 12/17/2022   ALKPHOS 59 12/17/2022   BILITOT 1.4 (H) 12/17/2022   Lab Results  Component Value Date   CHOL 170 12/18/2022   HDL 45 12/18/2022   LDLCALC 100 (H) 12/18/2022   TRIG 123 12/18/2022   CHOLHDL 3.8 12/18/2022    Lab Results  Component Value Date   HGBA1C 5.3 12/18/2022    Assessment & Plan   1. Cardiac arrest: Cath revealed CTO of OM with collaterals, otherwise nonobstructive  CAD.  Echocardiogram showed preserved LVEF.  S/p ICD.  No arrhythmias  detected on device per EP.  2. Intermittent AV block: S/p ICD.  A-paced on EKG today.  Following with EP.   3. CAD: S/p NSTEMI, DES-OM1 in 2011.  Recent cath in the setting of cardiac arrest revealed CTO of OM with collaterals, otherwise nonobstructive CAD.  Stable with no anginal symptoms.  Encouraged increased activity as tolerated.  Continue aspirin, metoprolol.  4. Hyperlipidemia: LDL was 100 and 12/2022.  He is statin intolerant.  He did not tolerate Repatha.  He is following with our lipid clinic Pharm.D. for consideration of Inclisiran.   5. OSA: Adherent to CPAP.  6. Disposition: Follow-up as scheduled with Dr. Lovena Le in 04/2023.  Follow-up in 4 to 6 months with Dr. Margaretann Loveless.     Lenna Sciara, NP 01/06/2023, 5:47 PM

## 2023-01-07 DIAGNOSIS — G4733 Obstructive sleep apnea (adult) (pediatric): Secondary | ICD-10-CM | POA: Diagnosis not present

## 2023-01-07 DIAGNOSIS — I442 Atrioventricular block, complete: Secondary | ICD-10-CM | POA: Diagnosis not present

## 2023-01-07 DIAGNOSIS — H409 Unspecified glaucoma: Secondary | ICD-10-CM | POA: Diagnosis not present

## 2023-01-07 DIAGNOSIS — E785 Hyperlipidemia, unspecified: Secondary | ICD-10-CM | POA: Diagnosis not present

## 2023-01-07 DIAGNOSIS — K219 Gastro-esophageal reflux disease without esophagitis: Secondary | ICD-10-CM | POA: Diagnosis not present

## 2023-01-07 DIAGNOSIS — K589 Irritable bowel syndrome without diarrhea: Secondary | ICD-10-CM | POA: Diagnosis not present

## 2023-01-07 DIAGNOSIS — Z86010 Personal history of colonic polyps: Secondary | ICD-10-CM | POA: Diagnosis not present

## 2023-01-07 DIAGNOSIS — I2510 Atherosclerotic heart disease of native coronary artery without angina pectoris: Secondary | ICD-10-CM | POA: Diagnosis not present

## 2023-01-07 DIAGNOSIS — M8580 Other specified disorders of bone density and structure, unspecified site: Secondary | ICD-10-CM | POA: Diagnosis not present

## 2023-01-07 DIAGNOSIS — Z48812 Encounter for surgical aftercare following surgery on the circulatory system: Secondary | ICD-10-CM | POA: Diagnosis not present

## 2023-01-07 DIAGNOSIS — K2270 Barrett's esophagus without dysplasia: Secondary | ICD-10-CM | POA: Diagnosis not present

## 2023-01-07 DIAGNOSIS — G931 Anoxic brain damage, not elsewhere classified: Secondary | ICD-10-CM | POA: Diagnosis not present

## 2023-01-07 DIAGNOSIS — E039 Hypothyroidism, unspecified: Secondary | ICD-10-CM | POA: Diagnosis not present

## 2023-01-07 DIAGNOSIS — I252 Old myocardial infarction: Secondary | ICD-10-CM | POA: Diagnosis not present

## 2023-01-07 DIAGNOSIS — L719 Rosacea, unspecified: Secondary | ICD-10-CM | POA: Diagnosis not present

## 2023-01-07 DIAGNOSIS — M4312 Spondylolisthesis, cervical region: Secondary | ICD-10-CM | POA: Diagnosis not present

## 2023-01-07 DIAGNOSIS — I255 Ischemic cardiomyopathy: Secondary | ICD-10-CM | POA: Diagnosis not present

## 2023-01-07 DIAGNOSIS — M47812 Spondylosis without myelopathy or radiculopathy, cervical region: Secondary | ICD-10-CM | POA: Diagnosis not present

## 2023-01-07 DIAGNOSIS — H90A32 Mixed conductive and sensorineural hearing loss, unilateral, left ear with restricted hearing on the contralateral side: Secondary | ICD-10-CM | POA: Diagnosis not present

## 2023-01-07 DIAGNOSIS — K760 Fatty (change of) liver, not elsewhere classified: Secondary | ICD-10-CM | POA: Diagnosis not present

## 2023-01-07 DIAGNOSIS — Z7982 Long term (current) use of aspirin: Secondary | ICD-10-CM | POA: Diagnosis not present

## 2023-01-07 DIAGNOSIS — M720 Palmar fascial fibromatosis [Dupuytren]: Secondary | ICD-10-CM | POA: Diagnosis not present

## 2023-01-10 DIAGNOSIS — M720 Palmar fascial fibromatosis [Dupuytren]: Secondary | ICD-10-CM | POA: Diagnosis not present

## 2023-01-10 DIAGNOSIS — M8580 Other specified disorders of bone density and structure, unspecified site: Secondary | ICD-10-CM | POA: Diagnosis not present

## 2023-01-10 DIAGNOSIS — H409 Unspecified glaucoma: Secondary | ICD-10-CM | POA: Diagnosis not present

## 2023-01-10 DIAGNOSIS — K760 Fatty (change of) liver, not elsewhere classified: Secondary | ICD-10-CM | POA: Diagnosis not present

## 2023-01-10 DIAGNOSIS — K589 Irritable bowel syndrome without diarrhea: Secondary | ICD-10-CM | POA: Diagnosis not present

## 2023-01-10 DIAGNOSIS — Z7982 Long term (current) use of aspirin: Secondary | ICD-10-CM | POA: Diagnosis not present

## 2023-01-10 DIAGNOSIS — Z48812 Encounter for surgical aftercare following surgery on the circulatory system: Secondary | ICD-10-CM | POA: Diagnosis not present

## 2023-01-10 DIAGNOSIS — I442 Atrioventricular block, complete: Secondary | ICD-10-CM | POA: Diagnosis not present

## 2023-01-10 DIAGNOSIS — M47812 Spondylosis without myelopathy or radiculopathy, cervical region: Secondary | ICD-10-CM | POA: Diagnosis not present

## 2023-01-10 DIAGNOSIS — I252 Old myocardial infarction: Secondary | ICD-10-CM | POA: Diagnosis not present

## 2023-01-10 DIAGNOSIS — K219 Gastro-esophageal reflux disease without esophagitis: Secondary | ICD-10-CM | POA: Diagnosis not present

## 2023-01-10 DIAGNOSIS — E785 Hyperlipidemia, unspecified: Secondary | ICD-10-CM | POA: Diagnosis not present

## 2023-01-10 DIAGNOSIS — G4733 Obstructive sleep apnea (adult) (pediatric): Secondary | ICD-10-CM | POA: Diagnosis not present

## 2023-01-10 DIAGNOSIS — M4312 Spondylolisthesis, cervical region: Secondary | ICD-10-CM | POA: Diagnosis not present

## 2023-01-10 DIAGNOSIS — I2510 Atherosclerotic heart disease of native coronary artery without angina pectoris: Secondary | ICD-10-CM | POA: Diagnosis not present

## 2023-01-10 DIAGNOSIS — G931 Anoxic brain damage, not elsewhere classified: Secondary | ICD-10-CM | POA: Diagnosis not present

## 2023-01-10 DIAGNOSIS — H90A32 Mixed conductive and sensorineural hearing loss, unilateral, left ear with restricted hearing on the contralateral side: Secondary | ICD-10-CM | POA: Diagnosis not present

## 2023-01-10 DIAGNOSIS — K2270 Barrett's esophagus without dysplasia: Secondary | ICD-10-CM | POA: Diagnosis not present

## 2023-01-10 DIAGNOSIS — E039 Hypothyroidism, unspecified: Secondary | ICD-10-CM | POA: Diagnosis not present

## 2023-01-10 DIAGNOSIS — I255 Ischemic cardiomyopathy: Secondary | ICD-10-CM | POA: Diagnosis not present

## 2023-01-10 DIAGNOSIS — Z86010 Personal history of colonic polyps: Secondary | ICD-10-CM | POA: Diagnosis not present

## 2023-01-10 DIAGNOSIS — L719 Rosacea, unspecified: Secondary | ICD-10-CM | POA: Diagnosis not present

## 2023-01-12 ENCOUNTER — Encounter: Payer: Self-pay | Admitting: Internal Medicine

## 2023-01-17 ENCOUNTER — Other Ambulatory Visit: Payer: Self-pay | Admitting: Pharmacist Clinician (PhC)/ Clinical Pharmacy Specialist

## 2023-01-17 ENCOUNTER — Telehealth: Payer: Self-pay | Admitting: Pharmacy Technician

## 2023-01-17 DIAGNOSIS — K589 Irritable bowel syndrome without diarrhea: Secondary | ICD-10-CM | POA: Diagnosis not present

## 2023-01-17 DIAGNOSIS — Z86010 Personal history of colonic polyps: Secondary | ICD-10-CM | POA: Diagnosis not present

## 2023-01-17 DIAGNOSIS — M47812 Spondylosis without myelopathy or radiculopathy, cervical region: Secondary | ICD-10-CM | POA: Diagnosis not present

## 2023-01-17 DIAGNOSIS — Z7982 Long term (current) use of aspirin: Secondary | ICD-10-CM | POA: Diagnosis not present

## 2023-01-17 DIAGNOSIS — L719 Rosacea, unspecified: Secondary | ICD-10-CM | POA: Diagnosis not present

## 2023-01-17 DIAGNOSIS — M720 Palmar fascial fibromatosis [Dupuytren]: Secondary | ICD-10-CM | POA: Diagnosis not present

## 2023-01-17 DIAGNOSIS — G931 Anoxic brain damage, not elsewhere classified: Secondary | ICD-10-CM | POA: Diagnosis not present

## 2023-01-17 DIAGNOSIS — M4312 Spondylolisthesis, cervical region: Secondary | ICD-10-CM | POA: Diagnosis not present

## 2023-01-17 DIAGNOSIS — H90A32 Mixed conductive and sensorineural hearing loss, unilateral, left ear with restricted hearing on the contralateral side: Secondary | ICD-10-CM | POA: Diagnosis not present

## 2023-01-17 DIAGNOSIS — E039 Hypothyroidism, unspecified: Secondary | ICD-10-CM | POA: Diagnosis not present

## 2023-01-17 DIAGNOSIS — E785 Hyperlipidemia, unspecified: Secondary | ICD-10-CM | POA: Diagnosis not present

## 2023-01-17 DIAGNOSIS — M8580 Other specified disorders of bone density and structure, unspecified site: Secondary | ICD-10-CM | POA: Diagnosis not present

## 2023-01-17 DIAGNOSIS — I252 Old myocardial infarction: Secondary | ICD-10-CM | POA: Diagnosis not present

## 2023-01-17 DIAGNOSIS — G4733 Obstructive sleep apnea (adult) (pediatric): Secondary | ICD-10-CM | POA: Diagnosis not present

## 2023-01-17 DIAGNOSIS — K760 Fatty (change of) liver, not elsewhere classified: Secondary | ICD-10-CM | POA: Diagnosis not present

## 2023-01-17 DIAGNOSIS — I2510 Atherosclerotic heart disease of native coronary artery without angina pectoris: Secondary | ICD-10-CM | POA: Diagnosis not present

## 2023-01-17 DIAGNOSIS — I442 Atrioventricular block, complete: Secondary | ICD-10-CM | POA: Diagnosis not present

## 2023-01-17 DIAGNOSIS — H409 Unspecified glaucoma: Secondary | ICD-10-CM | POA: Diagnosis not present

## 2023-01-17 DIAGNOSIS — I255 Ischemic cardiomyopathy: Secondary | ICD-10-CM | POA: Diagnosis not present

## 2023-01-17 DIAGNOSIS — Z48812 Encounter for surgical aftercare following surgery on the circulatory system: Secondary | ICD-10-CM | POA: Diagnosis not present

## 2023-01-17 DIAGNOSIS — K2270 Barrett's esophagus without dysplasia: Secondary | ICD-10-CM | POA: Diagnosis not present

## 2023-01-17 DIAGNOSIS — K219 Gastro-esophageal reflux disease without esophagitis: Secondary | ICD-10-CM | POA: Diagnosis not present

## 2023-01-17 NOTE — Telephone Encounter (Signed)
Matthew Pratt note:  Auth Submission: APPROVED Payer: [employer] MEDICARE Medication & CPT/J Code(s) submitted: Leqvio (Christin Bach) [phone] Route of submission (phone, fax, portal):  Phone # Fax # Auth type: Buy/Bill Units/visits requested: X3 Reference number: [id number] Approval from: 01/17/23 to 01/18/24   Patient has only met $33.80 of his $3600 deductible and will be responsible for 20%. Patient may possibly  be eligible for NPAF. Email has been sent to [place] Once I have a response from NPAF patient will be scheduled.

## 2023-01-18 ENCOUNTER — Telehealth: Payer: Self-pay | Admitting: Family Medicine

## 2023-01-18 NOTE — Telephone Encounter (Signed)
Lorena RN with [facility] is calling to get VO for early dismissed for SN

## 2023-01-19 NOTE — Telephone Encounter (Signed)
Lorena informed of Ok

## 2023-01-20 NOTE — Telephone Encounter (Signed)
Pt stated that when he left the hospital, after having ICD implant, he does not remember any patient education on the device or what he is to expect when it fires. Pt would like Dr. Tanna Furry nurse to call him.

## 2023-01-21 ENCOUNTER — Ambulatory Visit: Payer: Self-pay

## 2023-01-21 NOTE — Patient Instructions (Signed)
Visit Information  Thank you for taking time to visit with me today. Please don't hesitate to contact me if I can be of assistance to you.   Following are the goals we discussed today:   Goals Addressed               This Visit's Progress     Post-op hospital D/C (CAD management)) (pt-stated)        Care Coordination Interventions: Provided education on importance of blood pressure control in management of CAD Provided education on Importance of limiting foods high in cholesterol Reviewed Importance of taking all medications as prescribed Reviewed Importance of attending all scheduled provider appointments Advised to report any changes in symptoms or exercise tolerance Advised patient to discuss pain medication, time limit on driving based upon post-op instructions with provider Screening for signs and symptoms of depression related to chronic disease state Assessed social determinant of health barriers Educated on care management services related to utilizing nurse care manager, pharmacy and social workers. Pt receptive to care management program and agreed to the discussed plan of care.  Incisional site continues to heal with no reported issues and pt is aware on who to call with any encountered issues.  Interventions Today    Flowsheet Row Most Recent Value  Chronic Disease   Chronic disease during today's visit Hypertension (HTN)  General Interventions   General Interventions Discussed/Reviewed General Interventions Discussed  Education Interventions   Education Provided Provided Education  Provided Verbal Education On Exercise, Medication, Mental Health/Coping with Illness  Mental Health Interventions   Mental Health Discussed/Reviewed Mental Health Discussed  Nutrition Interventions   Nutrition Discussed/Reviewed Nutrition Discussed, Adding fruits and vegetables, Decreasing fats, Decreasing salt             Our next appointment is by telephone on 02/18/23 at  1000  Please call the care guide team at [phone] if you need to cancel or reschedule your appointment.   If you are experiencing a Mental Health or [place] or need someone to talk to, please call the Suicide and Crisis Lifeline: 988   Patient verbalizes understanding of instructions and care plan provided today and agrees to view in [place]. Active MyChart status and patient understanding of how to access instructions and care plan via MyChart confirmed with patient.     The patient has been provided with contact information for the care management team and has been advised to call with any health related questions or concerns.   Jone Baseman, RN, MSN [facility] [facility] Care Management Coordinator Direct Line [phone]

## 2023-01-21 NOTE — Patient Outreach (Signed)
  Care Coordination   Follow Up Visit Note   01/21/2023 Name: Matthew Pratt MRN: [id number] DOB: 07-22-1951  Matthew Pratt is a 72 y.o. year old male who sees Burchette, Alinda Sierras, MD for primary care. I spoke with  Matthew Pratt by phone today.  What matters to the patients health and wellness today?  Continuing to heal.    Goals Addressed               This Visit's Progress     Post-op hospital D/C (CAD management)) (pt-stated)        Care Coordination Interventions: Provided education on importance of blood pressure control in management of CAD Provided education on Importance of limiting foods high in cholesterol Reviewed Importance of taking all medications as prescribed Reviewed Importance of attending all scheduled provider appointments Advised to report any changes in symptoms or exercise tolerance Advised patient to discuss pain medication, time limit on driving based upon post-op instructions with provider Screening for signs and symptoms of depression related to chronic disease state Assessed social determinant of health barriers Educated on care management services related to utilizing nurse care manager, pharmacy and social workers. Pt receptive to care management program and agreed to the discussed plan of care.  Incisional site continues to heal with no reported issues and pt is aware on who to call with any encountered issues.  Interventions Today    Flowsheet Row Most Recent Value  Chronic Disease   Chronic disease during today's visit Hypertension (HTN)  General Interventions   General Interventions Discussed/Reviewed General Interventions Discussed  Education Interventions   Education Provided Provided Education  Provided Verbal Education On Exercise, Medication, Mental Health/Coping with Illness  Mental Health Interventions   Mental Health Discussed/Reviewed Mental Health Discussed  Nutrition Interventions   Nutrition Discussed/Reviewed  Nutrition Discussed, Adding fruits and vegetables, Decreasing fats, Decreasing salt             SDOH assessments and interventions completed:  Yes  SDOH Interventions Today    Flowsheet Row Most Recent Value  SDOH Interventions   Transportation Interventions Other (Comment)  [No needs]        Care Coordination Interventions:  Yes, provided   Follow up plan: Follow up call scheduled for March    Encounter Outcome:  Pt. Visit Completed   Jone Baseman, RN, MSN [place] [facility] Care Management Coordinator Direct Line [phone]

## 2023-01-24 DIAGNOSIS — I252 Old myocardial infarction: Secondary | ICD-10-CM | POA: Diagnosis not present

## 2023-01-24 DIAGNOSIS — G4733 Obstructive sleep apnea (adult) (pediatric): Secondary | ICD-10-CM | POA: Diagnosis not present

## 2023-01-24 DIAGNOSIS — K219 Gastro-esophageal reflux disease without esophagitis: Secondary | ICD-10-CM | POA: Diagnosis not present

## 2023-01-24 DIAGNOSIS — L719 Rosacea, unspecified: Secondary | ICD-10-CM | POA: Diagnosis not present

## 2023-01-24 DIAGNOSIS — H90A32 Mixed conductive and sensorineural hearing loss, unilateral, left ear with restricted hearing on the contralateral side: Secondary | ICD-10-CM | POA: Diagnosis not present

## 2023-01-24 DIAGNOSIS — K760 Fatty (change of) liver, not elsewhere classified: Secondary | ICD-10-CM | POA: Diagnosis not present

## 2023-01-24 DIAGNOSIS — M720 Palmar fascial fibromatosis [Dupuytren]: Secondary | ICD-10-CM | POA: Diagnosis not present

## 2023-01-24 DIAGNOSIS — I255 Ischemic cardiomyopathy: Secondary | ICD-10-CM | POA: Diagnosis not present

## 2023-01-24 DIAGNOSIS — M47812 Spondylosis without myelopathy or radiculopathy, cervical region: Secondary | ICD-10-CM | POA: Diagnosis not present

## 2023-01-24 DIAGNOSIS — I442 Atrioventricular block, complete: Secondary | ICD-10-CM | POA: Diagnosis not present

## 2023-01-24 DIAGNOSIS — I2510 Atherosclerotic heart disease of native coronary artery without angina pectoris: Secondary | ICD-10-CM | POA: Diagnosis not present

## 2023-01-24 DIAGNOSIS — E785 Hyperlipidemia, unspecified: Secondary | ICD-10-CM | POA: Diagnosis not present

## 2023-01-24 DIAGNOSIS — H409 Unspecified glaucoma: Secondary | ICD-10-CM | POA: Diagnosis not present

## 2023-01-24 DIAGNOSIS — M4312 Spondylolisthesis, cervical region: Secondary | ICD-10-CM | POA: Diagnosis not present

## 2023-01-24 DIAGNOSIS — Z86010 Personal history of colonic polyps: Secondary | ICD-10-CM | POA: Diagnosis not present

## 2023-01-24 DIAGNOSIS — E039 Hypothyroidism, unspecified: Secondary | ICD-10-CM | POA: Diagnosis not present

## 2023-01-24 DIAGNOSIS — Z7982 Long term (current) use of aspirin: Secondary | ICD-10-CM | POA: Diagnosis not present

## 2023-01-24 DIAGNOSIS — K589 Irritable bowel syndrome without diarrhea: Secondary | ICD-10-CM | POA: Diagnosis not present

## 2023-01-24 DIAGNOSIS — G931 Anoxic brain damage, not elsewhere classified: Secondary | ICD-10-CM | POA: Diagnosis not present

## 2023-01-24 DIAGNOSIS — Z48812 Encounter for surgical aftercare following surgery on the circulatory system: Secondary | ICD-10-CM | POA: Diagnosis not present

## 2023-01-24 DIAGNOSIS — K2270 Barrett's esophagus without dysplasia: Secondary | ICD-10-CM | POA: Diagnosis not present

## 2023-01-24 DIAGNOSIS — M8580 Other specified disorders of bone density and structure, unspecified site: Secondary | ICD-10-CM | POA: Diagnosis not present

## 2023-02-07 ENCOUNTER — Encounter: Payer: Self-pay | Admitting: Pharmacist Clinician (PhC)/ Clinical Pharmacy Specialist

## 2023-02-07 NOTE — Telephone Encounter (Signed)
Hello Mr Wiegman, Your Heart rate is going to go up with activity then go back down upon resting. I will forward your message for review.

## 2023-02-08 DIAGNOSIS — K2270 Barrett's esophagus without dysplasia: Secondary | ICD-10-CM | POA: Diagnosis not present

## 2023-02-08 DIAGNOSIS — K589 Irritable bowel syndrome without diarrhea: Secondary | ICD-10-CM | POA: Diagnosis not present

## 2023-02-08 DIAGNOSIS — E039 Hypothyroidism, unspecified: Secondary | ICD-10-CM | POA: Diagnosis not present

## 2023-02-08 DIAGNOSIS — M4312 Spondylolisthesis, cervical region: Secondary | ICD-10-CM | POA: Diagnosis not present

## 2023-02-08 DIAGNOSIS — I252 Old myocardial infarction: Secondary | ICD-10-CM | POA: Diagnosis not present

## 2023-02-08 DIAGNOSIS — K219 Gastro-esophageal reflux disease without esophagitis: Secondary | ICD-10-CM | POA: Diagnosis not present

## 2023-02-08 DIAGNOSIS — Z86010 Personal history of colonic polyps: Secondary | ICD-10-CM | POA: Diagnosis not present

## 2023-02-08 DIAGNOSIS — E785 Hyperlipidemia, unspecified: Secondary | ICD-10-CM | POA: Diagnosis not present

## 2023-02-08 DIAGNOSIS — M8580 Other specified disorders of bone density and structure, unspecified site: Secondary | ICD-10-CM | POA: Diagnosis not present

## 2023-02-08 DIAGNOSIS — I442 Atrioventricular block, complete: Secondary | ICD-10-CM | POA: Diagnosis not present

## 2023-02-08 DIAGNOSIS — I2510 Atherosclerotic heart disease of native coronary artery without angina pectoris: Secondary | ICD-10-CM | POA: Diagnosis not present

## 2023-02-08 DIAGNOSIS — L719 Rosacea, unspecified: Secondary | ICD-10-CM | POA: Diagnosis not present

## 2023-02-08 DIAGNOSIS — K760 Fatty (change of) liver, not elsewhere classified: Secondary | ICD-10-CM | POA: Diagnosis not present

## 2023-02-08 DIAGNOSIS — Z7982 Long term (current) use of aspirin: Secondary | ICD-10-CM | POA: Diagnosis not present

## 2023-02-08 DIAGNOSIS — H409 Unspecified glaucoma: Secondary | ICD-10-CM | POA: Diagnosis not present

## 2023-02-08 DIAGNOSIS — G4733 Obstructive sleep apnea (adult) (pediatric): Secondary | ICD-10-CM | POA: Diagnosis not present

## 2023-02-08 DIAGNOSIS — G931 Anoxic brain damage, not elsewhere classified: Secondary | ICD-10-CM | POA: Diagnosis not present

## 2023-02-08 DIAGNOSIS — H90A32 Mixed conductive and sensorineural hearing loss, unilateral, left ear with restricted hearing on the contralateral side: Secondary | ICD-10-CM | POA: Diagnosis not present

## 2023-02-08 DIAGNOSIS — M720 Palmar fascial fibromatosis [Dupuytren]: Secondary | ICD-10-CM | POA: Diagnosis not present

## 2023-02-08 DIAGNOSIS — M47812 Spondylosis without myelopathy or radiculopathy, cervical region: Secondary | ICD-10-CM | POA: Diagnosis not present

## 2023-02-08 DIAGNOSIS — Z48812 Encounter for surgical aftercare following surgery on the circulatory system: Secondary | ICD-10-CM | POA: Diagnosis not present

## 2023-02-08 DIAGNOSIS — I255 Ischemic cardiomyopathy: Secondary | ICD-10-CM | POA: Diagnosis not present

## 2023-02-10 NOTE — Telephone Encounter (Signed)
Erasmo Downer,  Patient has been approved for [employer].  We will schedule patient as soon as possible. Id: [id number] Gr: [id number] Bin: [id number] Pcn: [id number] $2500 EXP: 01/09/24.

## 2023-02-14 ENCOUNTER — Encounter: Payer: Self-pay | Admitting: Family Medicine

## 2023-02-14 ENCOUNTER — Ambulatory Visit (INDEPENDENT_AMBULATORY_CARE_PROVIDER_SITE_OTHER): Payer: Medicare Other | Admitting: Family Medicine

## 2023-02-14 VITALS — BP 104/60 | HR 62 | Temp 97.5°F | Ht 73.0 in | Wt 200.5 lb

## 2023-02-14 DIAGNOSIS — R7303 Prediabetes: Secondary | ICD-10-CM

## 2023-02-14 DIAGNOSIS — F339 Major depressive disorder, recurrent, unspecified: Secondary | ICD-10-CM | POA: Diagnosis not present

## 2023-02-14 DIAGNOSIS — I2510 Atherosclerotic heart disease of native coronary artery without angina pectoris: Secondary | ICD-10-CM | POA: Diagnosis not present

## 2023-02-14 DIAGNOSIS — I442 Atrioventricular block, complete: Secondary | ICD-10-CM | POA: Diagnosis not present

## 2023-02-14 NOTE — Progress Notes (Signed)
Established Patient Office Visit  Subjective   Patient ID: Matthew Pratt, male    DOB: [birth year]  Age: 72 y.o. MRN: [id number]  Chief Complaint  Patient presents with   Medical Management of Chronic Issues    HPI  {History (Optional):23778} Matthew Pratt is seen for medical follow-up.  He is here predominantly with questions about diet.  We again reviewed issues regarding his recent out-of-hospital cardiac arrest with complete heart block.  In the past, he has had success with keto type diets.  He is also mindful of the importance of heart healthy diet and is trying to reconcile these dietary approaches..  One of his major concerns regarding recent events was his short-term memory loss.  He does feel like cognitively he is returning to baseline at this time.  He is very pleased with that.  Still has some anterior chest soreness but overall much improved.  Has significant pain with sneezing.  No recent cough.  Denies any recent dizziness or dyspnea with day-to-day activities.  Medications reviewed.  He remains on Synthroid per endocrinology.  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  He also had some concerns regarding elevated blood sugars in looking over his labs from recent hospitalization.  We did point out that his A1c was actually stable at 5.7% and elevated glucose levels may have been partly related to IV fluids  Past Medical History:  Diagnosis Date   Anxiety    Arthritis    BPH (benign prostatic hypertrophy)    CAD (coronary artery disease)    s/p STEMI 10/15/10 w/ Promus DES placed x1 in the first OM   Colon polyps    Complication of anesthesia    pt. reported 2015 penile inplant when getting some anesthesia had chest pain,they readjusted  the meds. and continued with surgery. Had no further problems di not have to follow up with anyone.   Depression    Depression with anxiety    Dupuytren's contracture of hand    ED (erectile  dysfunction)    GERD (gastroesophageal reflux disease)    Glaucoma    HLD (hyperlipidemia)    Hypocontractile bladder    Hypothyroidism    MI (myocardial infarction) ([place])    denies   OSA on CPAP    Peyronie's disease    Rosacea    Skin cancer    BCC   Past Surgical History:  Procedure Laterality Date   CHOLECYSTECTOMY N/A 02/10/2021   Procedure: LAPAROSCOPIC CHOLECYSTECTOMY;  Surgeon: Coralie Keens, MD;  Location: [facility];  Service: General;  Laterality: N/A;   COLONOSCOPY WITH ESOPHAGOGASTRODUODENOSCOPY (EGD)     multiple   CORONARY STENT PLACEMENT     ICD IMPLANT N/A 12/20/2022   Procedure: ICD IMPLANT;  Surgeon: Evans Lance, MD;  Location: [facility];  Service: Cardiovascular;  Laterality: N/A;   INNER EAR SURGERY     LAPAROSCOPIC CHOLECYSTECTOMY Bilateral 02/09/2021   Dr.blackmon   LASIK  02/1998   LEFT HEART CATH AND CORONARY ANGIOGRAPHY N/A 12/15/2022   Procedure: LEFT HEART CATH AND CORONARY ANGIOGRAPHY;  Surgeon: Leonie Man, MD;  Location: [facility];  Service: Cardiovascular;  Laterality: N/A;   PENILE PROSTHESIS IMPLANT     TONSILECTOMY, ADENOIDECTOMY, BILATERAL MYRINGOTOMY AND TUBES     TONSILLECTOMY     VASECTOMY     WISDOM TOOTH EXTRACTION      reports that he has never smoked. He has never used smokeless tobacco. He reports  that he does not drink alcohol and does not use drugs. family history includes Arthritis in his paternal grandfather; Basal cell carcinoma in his father and sister; COPD in his mother and another family member; Cancer in his father; Colon cancer in his paternal grandfather and paternal grandmother; Heart failure in his maternal grandfather; Liver cancer in his father; Melanoma in his sister; Rheum arthritis in his maternal grandmother; Varicose Veins in his father. Allergies  Allergen Reactions   Bee Venom Hives and Swelling   Crestor [Rosuvastatin] Other (See Comments)    Myalgia.     Iodinated Contrast Media  Hives and Itching   Sulfa Antibiotics Rash   Sulfonamide Derivatives Rash    Review of Systems  Constitutional:  Negative for fever and malaise/fatigue.  Eyes:  Negative for blurred vision.  Respiratory:  Negative for shortness of breath.   Cardiovascular:  Negative for chest pain.  Neurological:  Negative for dizziness, weakness and headaches.      Objective:     BP 104/60 (BP Location: Left Arm, Patient Position: Sitting, Cuff Size: Normal)   Pulse 62   Temp (!) 97.5 F (36.4 C) (Oral)   Ht '6\' 1"'$  (1.854 m)   Wt 200 lb 8 oz (90.9 kg)   SpO2 98%   BMI 26.45 kg/m  BP Readings from Last 3 Encounters:  02/14/23 104/60  01/06/23 104/68  01/04/23 120/78   Wt Readings from Last 3 Encounters:  02/14/23 200 lb 8 oz (90.9 kg)  01/06/23 196 lb 12.8 oz (89.3 kg)  01/04/23 197 lb 12.8 oz (89.7 kg)      Physical Exam Vitals reviewed.  Constitutional:      General: He is not in acute distress. Cardiovascular:     Rate and Rhythm: Normal rate and regular rhythm.  Pulmonary:     Effort: Pulmonary effort is normal.     Breath sounds: Normal breath sounds. No wheezing or rales.  Musculoskeletal:     Right lower leg: No edema.     Left lower leg: No edema.  Neurological:     Mental Status: He is alert.      No results found for any visits on 02/14/23.  Last CBC Lab Results  Component Value Date   WBC 9.1 12/21/2022   HGB 13.9 12/21/2022   HCT 39.1 12/21/2022   MCV 99.7 12/21/2022   MCH 35.5 (H) 12/21/2022   RDW 11.9 12/21/2022   PLT 152 [id number]   Last metabolic panel Lab Results  Component Value Date   GLUCOSE 160 (H) 12/21/2022   NA 136 12/21/2022   K 4.2 12/21/2022   CL 103 12/21/2022   CO2 25 12/21/2022   BUN 20 12/21/2022   CREATININE 1.10 12/21/2022   GFRNONAA >60 12/21/2022   CALCIUM 9.1 12/21/2022   PHOS 2.3 (L) 12/17/2022   PROT 5.9 (L) 12/17/2022   ALBUMIN 3.3 (L) 12/17/2022   BILITOT 1.4 (H) 12/17/2022   ALKPHOS 59 12/17/2022   AST 76  (H) 12/17/2022   ALT 107 (H) 12/17/2022   ANIONGAP 8 12/21/2022   Last lipids Lab Results  Component Value Date   CHOL 170 12/18/2022   HDL 45 12/18/2022   LDLCALC 100 (H) 12/18/2022   TRIG 123 12/18/2022   CHOLHDL 3.8 12/18/2022   Last hemoglobin A1c Lab Results  Component Value Date   HGBA1C 5.3 12/18/2022   Last thyroid functions Lab Results  Component Value Date   TSH 3.82 02/22/2022      The ASCVD Risk  score (Arnett DK, et al., 2019) failed to calculate for the following reasons:   The patient has a prior MI or stroke diagnosis    Assessment & Plan:   #1 history of CAD with recent cardiac catheterization showing no obvious culprit lesion to explain cardiac arrest.  Discussed heart healthy eating plan with plenty of fruits,vegetables, legumes, nuts and lean proteins  #2 history of complete heart block-now with pacemaker and defibrillator.  Denies any recent dizziness.  No further syncopal episodes..  Continue close follow-up with cardiology.  #3 history of recurrent depression currently stable on Effexor 112 mg/day.  He had some mood instability following recent hospitalization but this seems to have stabilized.  [name], MD

## 2023-02-15 ENCOUNTER — Encounter: Payer: Self-pay | Admitting: Internal Medicine

## 2023-02-16 ENCOUNTER — Other Ambulatory Visit: Payer: Self-pay | Admitting: Family Medicine

## 2023-02-17 ENCOUNTER — Telehealth: Payer: Self-pay | Admitting: Internal Medicine

## 2023-02-17 NOTE — Telephone Encounter (Signed)
Called pt. He states "I think we are all squared aware. I had a miss call but I know I had my appt moved up. I think we are good." Pt thanked me for calling him back.

## 2023-02-17 NOTE — Telephone Encounter (Signed)
Pt is returning phone call ° °

## 2023-02-18 ENCOUNTER — Ambulatory Visit: Payer: Self-pay

## 2023-02-18 ENCOUNTER — Ambulatory Visit (INDEPENDENT_AMBULATORY_CARE_PROVIDER_SITE_OTHER): Payer: Medicare Other

## 2023-02-18 VITALS — BP 115/76 | HR 60 | Temp 98.1°F | Resp 16 | Ht 73.0 in | Wt 200.0 lb

## 2023-02-18 DIAGNOSIS — E785 Hyperlipidemia, unspecified: Secondary | ICD-10-CM | POA: Diagnosis not present

## 2023-02-18 DIAGNOSIS — E039 Hypothyroidism, unspecified: Secondary | ICD-10-CM | POA: Diagnosis not present

## 2023-02-18 MED ORDER — INCLISIRAN SODIUM 284 MG/1.5ML ~~LOC~~ SOSY
284.0000 mg | PREFILLED_SYRINGE | Freq: Once | SUBCUTANEOUS | Status: AC
  Administered 2023-02-18: 284 mg via SUBCUTANEOUS

## 2023-02-18 NOTE — Progress Notes (Signed)
Diagnosis: Hyperlipidemia  Provider:  Marshell Garfinkel MD  Procedure: Injection  Leqvio (inclisiran), Dose: 284 mg, Site: subcutaneous, Number of injections: 1  Administered in right arm.  Post Care: Observation period completed  Discharge: Condition: Good, Destination: Home . AVS Declined  Performed by:  Binnie Kand, RN

## 2023-02-18 NOTE — Patient Instructions (Signed)
Visit Information  Thank you for taking time to visit with me today. Please don't hesitate to contact me if I can be of assistance to you.   Following are the goals we discussed today:   Goals Addressed               This Visit's Progress     Post-op hospital D/C (CAD management)) (pt-stated)        Care Coordination Interventions: Provided education on importance of blood pressure control in management of CAD Provided education on Importance of limiting foods high in cholesterol Reviewed Importance of taking all medications as prescribed Reviewed Importance of attending all scheduled provider appointments Advised to report any changes in symptoms or exercise tolerance Advised patient to discuss pain medication, time limit on driving based upon post-op instructions with provider Screening for signs and symptoms of depression related to chronic disease state Assessed social determinant of health barriers Educated on care management services related to utilizing nurse care manager, pharmacy and social workers. Pt receptive to care management program and agreed to the discussed plan of care.  Incisional site continues to heal with no reported issues.  Interventions Today    Flowsheet Row Most Recent Value  Chronic Disease   Chronic disease during today's visit Hypertension (HTN)  General Interventions   General Interventions Discussed/Reviewed General Interventions Reviewed, Doctor Visits  Doctor Visits Discussed/Reviewed Doctor Visits Discussed  Education Interventions   Education Provided Provided Education  [HTN and CAD]  Provided Verbal Education On Mental Health/Coping with Illness, Nutrition, Medication  Nutrition Interventions   Nutrition Discussed/Reviewed Nutrition Reviewed, Decreasing fats  Pharmacy Interventions   Pharmacy Dicussed/Reviewed Medications and their functions             Our next appointment is by telephone on 04/15/23 at 1000  Please call the  care guide team at [phone] if you need to cancel or reschedule your appointment.   If you are experiencing a Mental Health or [place] or need someone to talk to, please call the Suicide and Crisis Lifeline: 988   Patient verbalizes understanding of instructions and care plan provided today and agrees to view in [place]. Active MyChart status and patient understanding of how to access instructions and care plan via MyChart confirmed with patient.     The patient has been provided with contact information for the care management team and has been advised to call with any health related questions or concerns.   Jone Baseman, RN, MSN [facility] [facility] Care Management Coordinator Direct Line [phone]

## 2023-02-18 NOTE — Patient Outreach (Signed)
  Care Coordination   Follow Up Visit Note   02/18/2023 Name: Matthew Pratt MRN: [id number] DOB: 22-Feb-1951  Matthew Pratt is a 72 y.o. year old male who sees Burchette, Alinda Sierras, MD for primary care. I spoke with  Matthew Pratt by phone today.  What matters to the patients health and wellness today?  Getting better daily    Goals Addressed               This Visit's Progress     Post-op hospital D/C (CAD management)) (pt-stated)        Care Coordination Interventions: Provided education on importance of blood pressure control in management of CAD Provided education on Importance of limiting foods high in cholesterol Reviewed Importance of taking all medications as prescribed Reviewed Importance of attending all scheduled provider appointments Advised to report any changes in symptoms or exercise tolerance Advised patient to discuss pain medication, time limit on driving based upon post-op instructions with provider Screening for signs and symptoms of depression related to chronic disease state Assessed social determinant of health barriers Educated on care management services related to utilizing nurse care manager, pharmacy and social workers. Pt receptive to care management program and agreed to the discussed plan of care.  Incisional site continues to heal with no reported issues.  Interventions Today    Flowsheet Row Most Recent Value  Chronic Disease   Chronic disease during today's visit Hypertension (HTN)  General Interventions   General Interventions Discussed/Reviewed General Interventions Reviewed, Doctor Visits  Doctor Visits Discussed/Reviewed Doctor Visits Discussed  Education Interventions   Education Provided Provided Education  [HTN and CAD]  Provided Verbal Education On Mental Health/Coping with Illness, Nutrition, Medication  Nutrition Interventions   Nutrition Discussed/Reviewed Nutrition Reviewed, Decreasing fats  Pharmacy Interventions    Pharmacy Dicussed/Reviewed Medications and their functions             SDOH assessments and interventions completed:  Yes     Care Coordination Interventions:  Yes, provided   Follow up plan: Follow up call scheduled for May    Encounter Outcome:  Pt. Visit Completed   Jone Baseman, RN, MSN [place] [facility] Care Management Coordinator Direct Line [phone]

## 2023-02-20 ENCOUNTER — Encounter: Payer: Self-pay | Admitting: Pharmacist Clinician (PhC)/ Clinical Pharmacy Specialist

## 2023-03-04 NOTE — Telephone Encounter (Signed)
Spoke with patient.  Had injection on March 15 ,has had itching and bumps on skin since then.  Will see Dr. Margaretann Loveless next week, if they are still there we can discuss whether to proceed with next injection - due mid June

## 2023-03-09 NOTE — Progress Notes (Signed)
Cardiology Office Note:    Date:  03/10/2023  ID:  Matthew Pratt, DOB August 06, 1951, MRN [id number]  PCP:  Kristian Covey, MD  Cardiologist:  Parke Poisson, MD  Electrophysiologist:  None   Referring MD: Kristian Covey, MD   Chief Complaint/Reason for Referral: Follow-up evaluation  History of Present Illness:    Matthew Pratt is a 72 y.o. male with a history of STEMI 10/15/10 with Promus DES placed x 1 in OM1, BPH, HLD, OSA on CPAP.   Patient was seen by me on 12/30/2020 and he was able to exert himself >4 METS with no chest pain, no DOE. At time of MI in 2011, he had typical crushing chest pain. He had had no recurrence of this quality of pain. He felt he had been well since his last cardiology visit. Used his recumbent bike at at least a moderate level with no symptoms. He had OSA and used CPAP. One lifetime episode of syncope, 35 years ago at work, remembers having tunnel vision. Noted significant myalgias with statins. Discussed PCSK9I with indication of secondary prevention of CAD. He wanted to consider and we would arrange CVRR visit. An echocardiogram was to be obtained for cardioavascular function prior to surgery, with no concerning findings.  He followed up with [name], RPH on 01/07/2021 for follow-up evaluation of his hyperlipidemia and cholesterol labs were ordered that day and repeated after 5-6 doses of Repatha.  He presented to the ED on 05/05/2022 for chest pain ongoing for about 2 weeks intermittently. Described as mid to left-sided chest pain. Nonradiating. He had an episode the night before. Symptoms lasted for several hours and continued that morning as well. Trops x 2 negative. Cardiac ischemia felt less likely.   He was seen by Ronney Asters, NP on 05/11/22 for follow-up evaluation. He was doing well overall with no medical concerns.  At his last appointment he had lost some weight and his chest pain had resolved. He denied any falls but  complained of imbalance issues when turning rapidly. His liver enzymes September 2023 had normalized. Electrolytes and kidney function normal. WBC slightly low, but overall unchanged. His lab work revealed an LDL of 160. His cholesterol improved on Repatha but he did not tolerate, he cannot remember why.  On 12/15/2022 he was admitted to the hospital with witnessed cardiac arrest of unknown etiology. ROSC achieved after 9 minutes admitted to ICU 1/10, and successfully extubated 1/11. While sitting up he had a prolonged period of ventricular standstill and complete heart block with dizziness. No PE on CTA. Initial troponin rose to 910, not felt to be primary event. LVEF preserved at 55%. Cardiac catheterization revealed diffuse moderate-severe disease without obvious culprit lesion to explain cardiac arrest. EP ultimately placed dual chamber ICD 1/15. He was discharged on metoprolol.  Today, we reviewed his hospitalization 12/2022 and subsequent ICD implant. He states his prepectoral pocket has healed well. Discussed red flag symptoms of device infection or defibrillator therapies.   In the past, he has had 3 syncopal episodes. Prior to that, the last episode was 20+ years ago in the setting of being in the car and feeling very fatigued. In January he had also been very fatigued from work, and states that he couldn't rest when he went home as they had a Manufacturing systems engineer. While loading the dishwasher he blacked out and fell over backwards. Previously he had thought his blackouts were related to taking desvenlafaxine.   Additionally he had recently  been struggling with multiple side effects including Itching, stiffness of joints in the lower back, blushing, fatigue, depression, loose stools, and a pressure in his face. He attributed these symptoms to his metoprolol. After stopping the metoprolol he has been feeling better and it seems that lately his body is adapting. Generally he feels good today, but states last Saturday  he had gastric pains.  He denies any shortness of breath, peripheral edema, headaches, orthopnea, or PND.   Past Medical History:  Diagnosis Date   Anxiety    Arthritis    BPH (benign prostatic hypertrophy)    CAD (coronary artery disease)    s/p STEMI 10/15/10 w/ Promus DES placed x1 in the first OM   Colon polyps    Complication of anesthesia    pt. reported 2015 penile inplant when getting some anesthesia had chest pain,they readjusted  the meds. and continued with surgery. Had no further problems di not have to follow up with anyone.   Depression    Depression with anxiety    Dupuytren's contracture of hand    ED (erectile dysfunction)    GERD (gastroesophageal reflux disease)    Glaucoma    HLD (hyperlipidemia)    Hypocontractile bladder    Hypothyroidism    MI (myocardial infarction)    denies   OSA on CPAP    Peyronie's disease    Rosacea    Skin cancer    BCC    Past Surgical History:  Procedure Laterality Date   CHOLECYSTECTOMY N/A 02/10/2021   Procedure: LAPAROSCOPIC CHOLECYSTECTOMY;  Surgeon: Abigail Miyamoto, MD;  Location: [facility] OR;  Service: General;  Laterality: N/A;   COLONOSCOPY WITH ESOPHAGOGASTRODUODENOSCOPY (EGD)     multiple   CORONARY STENT PLACEMENT     ICD IMPLANT N/A 12/20/2022   Procedure: ICD IMPLANT;  Surgeon: Marinus Maw, MD;  Location: [facility] INVASIVE CV LAB;  Service: Cardiovascular;  Laterality: N/A;   INNER EAR SURGERY     LAPAROSCOPIC CHOLECYSTECTOMY Bilateral 02/09/2021   Dr.blackmon   LASIK  02/1998   LEFT HEART CATH AND CORONARY ANGIOGRAPHY N/A 12/15/2022   Procedure: LEFT HEART CATH AND CORONARY ANGIOGRAPHY;  Surgeon: Marykay Lex, MD;  Location: [facility]ASIVE CV LAB;  Service: Cardiovascular;  Laterality: N/A;   PENILE PROSTHESIS IMPLANT     TONSILECTOMY, ADENOIDECTOMY, BILATERAL MYRINGOTOMY AND TUBES     TONSILLECTOMY     VASECTOMY     WISDOM TOOTH EXTRACTION      Current Medications: Current Meds  Medication Sig    acetaminophen (TYLENOL) 500 MG tablet Take 500 mg by mouth every 6 (six) hours as needed for mild pain.   aspirin EC 81 MG tablet Take 81 mg by mouth every morning.   Calcium Carb-Cholecalciferol (OYSTER SHELL CALCIUM W/D) 500-5 MG-MCG TABS Take by mouth daily in the afternoon.   latanoprost (XALATAN) 0.005 % ophthalmic solution Place 1 drop into both eyes at bedtime.   omeprazole (PRILOSEC) 20 MG capsule Take 20 mg by mouth daily.   Probiotic Product (PROBIOTIC DAILY PO) Take 1 capsule by mouth daily in the afternoon.   Selenium 100 MCG TABS Take 100 mcg by mouth daily.   senna (SENOKOT) 8.6 MG tablet Take 1 tablet by mouth as needed for constipation.   Sodium Caprylate POWD Take 1 capsule by mouth daily in the afternoon.   SYNTHROID 137 MCG tablet Take 137 mcg by mouth daily before breakfast.   timolol (TIMOPTIC) 0.5 % ophthalmic solution Place 1 drop into both eyes  2 (two) times daily.   traZODone (DESYREL) 50 MG tablet TAKE 1/2 TO 1 TABLET BY MOUTH AT NIGHT AS NEEDED FOR INSOMNIA (Patient taking differently: Take 25-50 mg by mouth at bedtime as needed (insomnia).)   TURMERIC PO Take 1,500 mg by mouth daily.   venlafaxine XR (EFFEXOR-XR) 37.5 MG 24 hr capsule TAKE 3 CAPSULES BY MOUTH EVERY DAY (Patient taking differently: Take 112.5 mg by mouth daily.)   zinc gluconate 50 MG tablet Take 50 mg by mouth daily at 6 PM.     Allergies:   Bee venom, Crestor [rosuvastatin], Iodinated contrast media, Sulfa antibiotics, and Sulfonamide derivatives   Social History   Tobacco Use   Smoking status: Never   Smokeless tobacco: Never  Vaping Use   Vaping Use: Never used  Substance Use Topics   Alcohol use: No   Drug use: No     Family History: The patient's family history includes Arthritis in his paternal grandfather; Basal cell carcinoma in his father and sister; COPD in his mother and another family member; Cancer in his father; Colon cancer in his paternal grandfather and paternal  grandmother; Heart failure in his maternal grandfather; Liver cancer in his father; Melanoma in his sister; Rheum arthritis in his maternal grandmother; Varicose Veins in his father.  ROS:   Please see the history of present illness.    All other systems reviewed and are negative.  EKGs/Labs/Other Studies Reviewed:    The following studies were reviewed today:  ICD Implant  12/20/2022: CONCLUSIONS:   1. 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   2. Successful ICD implantation.   3. No early apparent complications.   Left Heart Cath  12/15/2022:   Prox LAD to Mid LAD lesion is 55% stenosed.  Dist LAD lesion is 45% stenosed.   Prox Cx to Mid Cx lesion is 55% stenosed.   1st Mrg (previously placed Drug Eluting stent is 100% stenosed -> CTO with left to left and right left lateral   Prox RCA lesion is 60% stenosed. Mid RCA lesion is 55% stenosed.   LV end diastolic pressure is low.   There is no aortic valve stenosis.   POST-CATH DIAGNOSES Diffuse moderate to severe disease with calcification in all the proximal vessels, and likely CTO of previous OM1 stent filling via collaterals from both left to right and right to left. No obvious culprit lesion to explain cardiac arrest. Low LVEDP of 6 mmHg. A-line pressures were in the high 80s to low 90s compared to cuff . Hypokalemia noted on labs-potassium 2.9.  Was started on IV potassium 10 mg once    RECOMMENDATION Monitor clinically for signs of recovery per PCCM. I suspect regional wall motion abnormality on echo was related to the CTO of the OM.  Diagnostic Dominance: Right   Echocardiogram  12/15/2022:  1. Left ventricular ejection fraction, by estimation, is 55%. The left  ventricle has low normal function. The left ventricle demonstrates  regional wall motion abnormalities (see scoring diagram/findings for  description). Left ventricular diastolic  parameters were  normal.   2. Right ventricular systolic function is mildly reduced. The right  ventricular size is mildly enlarged. Tricuspid regurgitation signal is  inadequate for assessing PA pressure.   3. Right atrial size was mildly dilated.   4. The mitral valve is grossly normal. Mild mitral valve regurgitation.  No evidence of mitral stenosis.   5. The aortic valve is grossly normal. There  is mild thickening of the  aortic valve. Aortic valve regurgitation is not visualized. No aortic  stenosis is present.   6. The inferior vena cava is normal in size with greater than 50%  respiratory variability, suggesting right atrial pressure of 3 mmHg.   ECHO 01/20/2021:  IMPRESSIONS   1. Left ventricular ejection fraction, by estimation, is 55 to 60%. The  left ventricle has normal function. The left ventricle has no regional  wall motion abnormalities. There is mild left ventricular hypertrophy.  Left ventricular diastolic parameters  are indeterminate.   2. Right ventricular systolic function is normal. The right ventricular  size is normal.   3. The mitral valve is normal in structure. Trivial mitral valve  regurgitation. No evidence of mitral stenosis.   4. The aortic valve is normal in structure. Aortic valve regurgitation is  mild. No aortic stenosis is present.    EKG:   EKG is personally reviewed. 03/10/2023:  Atrial-paced rhythm. 12/15/22: Sinus rhythm 60 bpm, nonspecific ST abnormality, grossly unchanged from May. 12/30/20: Sinus bradycardia 48 bpm  Imaging studies that I have independently reviewed today: n/a  Recent Labs: 12/17/2022: ALT 107; Magnesium 1.9 12/21/2022: BUN 20; Creatinine, Ser 1.10; Hemoglobin 13.9; Platelets 152; Potassium 4.2; Sodium 136   Recent Lipid Panel    Component Value Date/Time   CHOL 170 12/18/2022 0424   CHOL 163 03/25/2021 0954   TRIG 123 12/18/2022 0424   HDL 45 12/18/2022 0424   HDL 59 03/25/2021 0954   CHOLHDL 3.8 12/18/2022 0424   VLDL 25  12/18/2022 0424   LDLCALC 100 (H) 12/18/2022 0424   LDLCALC 86 03/25/2021 0954    Physical Exam:    VS:  BP 110/74 (BP Location: Left Arm, Patient Position: Sitting, Cuff Size: Normal)   Pulse 60   Ht 6\' 1"  (1.854 m)   Wt 201 lb 3.2 oz (91.3 kg)   SpO2 99%   BMI 26.55 kg/m     Wt Readings from Last 5 Encounters:  03/10/23 201 lb 3.2 oz (91.3 kg)  02/18/23 200 lb (90.7 kg)  02/14/23 200 lb 8 oz (90.9 kg)  01/06/23 196 lb 12.8 oz (89.3 kg)  01/04/23 197 lb 12.8 oz (89.7 kg)    Constitutional: No acute distress Eyes: sclera non-icteric, normal conjunctiva and lids ENMT: normal dentition, moist mucous membranes Cardiovascular: regular rhythm, normal rate, no murmur. S1 and S2 normal. No jugular venous distention.  ICD pocket well healed, incision c/d/I. Respiratory: clear to auscultation bilaterally GI : normal bowel sounds, soft and nontender. No distention.   MSK: extremities warm, well perfused. No edema.  NEURO: grossly nonfocal exam, moves all extremities. PSYCH: alert and oriented x 3, normal mood and affect.   ASSESSMENT:    1. Cardiac arrest   2. Cardiac defibrillator in situ   3. Heart block AV complete   4. SINUS BRADYCARDIA   5. Coronary artery disease involving native coronary artery of native heart without angina pectoris   6. History of MI (myocardial infarction)   7. Hyperlipidemia LDL goal <70   8. OSA (obstructive sleep apnea)   9. Atypical chest pain   10. Statin myopathy     PLAN:    Cardiac arrest, PEA ICD implant High grade AV block s/p ICD implant -no culprit CAD - PPM/ICD implant due to conduction system disease and arrest - brain fog post arrest has essentially resolved, feeling much better. - did not tolerate metoprolol, ok to d/c. - echo demonstrates preserved EF but  regionals in the distribution of occluded OM stent.   History of MI (myocardial infarction) - Plan: EKG 12-Lead Atherosclerosis of native coronary artery of native heart  without angina pectoris Possible cardiac chest pain episode, June 2023 -history of anterolateral STEMI with occluded first OM, s/p DES. Stent is now noted to be occluded with CTO and L-L and R-L collaterals -repeat cath at time of arrest demonstrated moderate residual disease LAD, LCX, RCA with no culprit for arrest -continue Aspirin 81 mg without interruption, daily.  - BP grossly normal.  Hyperlipidemia, unspecified hyperlipidemia type - statin myalgias, statin intolerant - PCSK9I not tolerated, LDL 100, CAD noted, needs aggressive seconary prevention.  - started inclisiran, but feels he may be reacting. Recommend observation and follow up with CVRR.   OSA (obstructive sleep apnea) -encourage compliance with CPAP.   Follow-up:  6 months.  Total time of encounter: 40 minutes total time of encounter, including 30 minutes spent in face-to-face patient care on the date of this encounter. This time includes coordination of care and counseling regarding above mentioned problem list. Remainder of non-face-to-face time involved reviewing chart documents/testing relevant to the patient encounter and documentation in the medical record. I have independently reviewed documentation from referring provider.   Weston Brass, MD, [facility] [place]  [facility]   Medication Adjustments/Labs and Tests Ordered: Current medicines are reviewed at length with the patient today.  Concerns regarding medicines are outlined above.   Orders Placed This Encounter  Procedures   EKG 12-Lead   No orders of the defined types were placed in this encounter.  Patient Instructions  Medication Instructions:  No Changes In Medications at this time.  *If you need a refill on your cardiac medications before your next appointment, please call your pharmacy*  Follow-Up: At [facility], you and your health needs are our priority.  As part of our continuing mission to provide you with exceptional heart  care, we have created designated Provider Care Teams.  These Care Teams include your primary Cardiologist (physician) and Advanced Practice Providers (APPs -  Physician Assistants and Nurse Practitioners) who all work together to provide you with the care you need, when you need it.  Your next appointment:   6 month(s)  Provider:   Parke Poisson, MD      [facility],acting as a scribe for Parke Poisson, MD.,have documented all relevant documentation on the behalf of Parke Poisson, MD,as directed by  Parke Poisson, MD while in the presence of Parke Poisson, MD.  I, Parke Poisson, MD, have reviewed all documentation for this visit. The documentation on 03/10/2023 for the exam, diagnosis, procedures, and orders are all accurate and complete.

## 2023-03-10 ENCOUNTER — Encounter: Payer: Self-pay | Admitting: Internal Medicine

## 2023-03-10 ENCOUNTER — Ambulatory Visit: Payer: Medicare Other | Attending: Internal Medicine | Admitting: Internal Medicine

## 2023-03-10 VITALS — BP 110/74 | HR 60 | Ht 73.0 in | Wt 201.2 lb

## 2023-03-10 DIAGNOSIS — Z95810 Presence of automatic (implantable) cardiac defibrillator: Secondary | ICD-10-CM | POA: Diagnosis not present

## 2023-03-10 DIAGNOSIS — I495 Sick sinus syndrome: Secondary | ICD-10-CM

## 2023-03-10 DIAGNOSIS — I442 Atrioventricular block, complete: Secondary | ICD-10-CM

## 2023-03-10 DIAGNOSIS — G4733 Obstructive sleep apnea (adult) (pediatric): Secondary | ICD-10-CM | POA: Diagnosis not present

## 2023-03-10 DIAGNOSIS — I2510 Atherosclerotic heart disease of native coronary artery without angina pectoris: Secondary | ICD-10-CM | POA: Diagnosis not present

## 2023-03-10 DIAGNOSIS — G720 Drug-induced myopathy: Secondary | ICD-10-CM | POA: Diagnosis not present

## 2023-03-10 DIAGNOSIS — T466X5A Adverse effect of antihyperlipidemic and antiarteriosclerotic drugs, initial encounter: Secondary | ICD-10-CM

## 2023-03-10 DIAGNOSIS — E785 Hyperlipidemia, unspecified: Secondary | ICD-10-CM

## 2023-03-10 DIAGNOSIS — I469 Cardiac arrest, cause unspecified: Secondary | ICD-10-CM | POA: Diagnosis not present

## 2023-03-10 DIAGNOSIS — I252 Old myocardial infarction: Secondary | ICD-10-CM

## 2023-03-10 DIAGNOSIS — R0789 Other chest pain: Secondary | ICD-10-CM | POA: Diagnosis not present

## 2023-03-10 NOTE — Patient Instructions (Signed)
Medication Instructions:  No Changes In Medications at this time.  *If you need a refill on your cardiac medications before your next appointment, please call your pharmacy*  Follow-Up: At [facility], you and your health needs are our priority.  As part of our continuing mission to provide you with exceptional heart care, we have created designated Provider Care Teams.  These Care Teams include your primary Cardiologist (physician) and Advanced Practice Providers (APPs -  Physician Assistants and Nurse Practitioners) who all work together to provide you with the care you need, when you need it.  Your next appointment:   6 month(s)  Provider:   Gayatri A Acharya, MD    

## 2023-03-14 DIAGNOSIS — R21 Rash and other nonspecific skin eruption: Secondary | ICD-10-CM | POA: Diagnosis not present

## 2023-03-14 DIAGNOSIS — Z862 Personal history of diseases of the blood and blood-forming organs and certain disorders involving the immune mechanism: Secondary | ICD-10-CM | POA: Diagnosis not present

## 2023-03-14 DIAGNOSIS — E039 Hypothyroidism, unspecified: Secondary | ICD-10-CM | POA: Diagnosis not present

## 2023-03-14 DIAGNOSIS — Z872 Personal history of diseases of the skin and subcutaneous tissue: Secondary | ICD-10-CM | POA: Diagnosis not present

## 2023-03-16 DIAGNOSIS — Z872 Personal history of diseases of the skin and subcutaneous tissue: Secondary | ICD-10-CM | POA: Diagnosis not present

## 2023-03-16 DIAGNOSIS — Z862 Personal history of diseases of the blood and blood-forming organs and certain disorders involving the immune mechanism: Secondary | ICD-10-CM | POA: Diagnosis not present

## 2023-03-16 DIAGNOSIS — Z0130 Encounter for examination of blood pressure without abnormal findings: Secondary | ICD-10-CM | POA: Diagnosis not present

## 2023-03-16 DIAGNOSIS — Z711 Person with feared health complaint in whom no diagnosis is made: Secondary | ICD-10-CM | POA: Diagnosis not present

## 2023-03-16 DIAGNOSIS — E039 Hypothyroidism, unspecified: Secondary | ICD-10-CM | POA: Diagnosis not present

## 2023-03-18 DIAGNOSIS — Z862 Personal history of diseases of the blood and blood-forming organs and certain disorders involving the immune mechanism: Secondary | ICD-10-CM | POA: Diagnosis not present

## 2023-03-20 ENCOUNTER — Encounter: Payer: Self-pay | Admitting: Internal Medicine

## 2023-03-21 ENCOUNTER — Telehealth: Payer: Medicare Other | Admitting: Nurse Practitioner

## 2023-03-21 ENCOUNTER — Other Ambulatory Visit: Payer: Self-pay | Admitting: Family Medicine

## 2023-03-21 ENCOUNTER — Encounter: Payer: Self-pay | Admitting: Family Medicine

## 2023-03-21 DIAGNOSIS — R21 Rash and other nonspecific skin eruption: Secondary | ICD-10-CM

## 2023-03-21 NOTE — Progress Notes (Signed)
Matthew Pratt,  I see you have reached out to your prescribing physician about the reaction back in March. I would recommend a follow up with them to discuss the possible reaction to your Leqvio.    I feel your condition warrants further evaluation and I recommend that you be seen for a face to face visit.  Please contact your primary care physician practice to be seen. Many offices offer virtual options to be seen via video if you are not comfortable going in person to a medical facility at this time.  NOTE: You will NOT be charged for this eVisit.  If you do not have a PCP, [facility] offers a free physician referral service available at [phone]. Our trained staff has the experience, knowledge and resources to put you in touch with a physician who is right for you.    If you are having a true medical emergency please call 911.   Your e-visit answers were reviewed by a board certified advanced clinical practitioner to complete your personal care plan.  Thank you for using e-Visits.

## 2023-03-22 ENCOUNTER — Encounter: Payer: Self-pay | Admitting: Family

## 2023-03-22 ENCOUNTER — Ambulatory Visit (INDEPENDENT_AMBULATORY_CARE_PROVIDER_SITE_OTHER): Payer: Medicare Other | Admitting: Family

## 2023-03-22 VITALS — BP 100/60 | HR 60 | Temp 97.7°F | Ht 73.0 in | Wt 197.5 lb

## 2023-03-22 DIAGNOSIS — L239 Allergic contact dermatitis, unspecified cause: Secondary | ICD-10-CM

## 2023-03-22 MED ORDER — TRAZODONE HCL 50 MG PO TABS: 25.0000 mg | ORAL_TABLET | Freq: Every evening | ORAL | 1 refills | Status: DC | PRN

## 2023-03-22 MED ORDER — PANTOPRAZOLE SODIUM 40 MG PO TBEC: 40.0000 mg | DELAYED_RELEASE_TABLET | Freq: Every day | ORAL | 3 refills | Status: DC

## 2023-03-22 NOTE — Telephone Encounter (Signed)
yes

## 2023-03-22 NOTE — Progress Notes (Signed)
Acute Office Visit  Subjective:     Patient ID: Matthew Pratt, male    DOB: Nov 16, 1951, 72 y.o.   MRN: [id number]  Chief Complaint  Patient presents with   Rash    Patient complains of rash, X1 month    HPI Patient is in today with concerns of dry, patchy, itchy rash to the face and arms. He is concerned that is could be related to taking Omeprazole or an injectable cholesterol medication. He reports the pharmacist suggests he try protonix over omeprazole. The rash has been ongoing x 3 months. Has been taking Claritin that helps.  Also needs a refill of Trazadone.   Review of Systems  Constitutional: Negative.   Respiratory: Negative.    Cardiovascular: Negative.   Musculoskeletal: Negative.   Skin:  Positive for itching and rash.  Psychiatric/Behavioral: Negative.    All other systems reviewed and are negative.  Past Medical History:  Diagnosis Date   Anxiety    Arthritis    BPH (benign prostatic hypertrophy)    CAD (coronary artery disease)    s/p STEMI 10/15/10 w/ Promus DES placed x1 in the first OM   Colon polyps    Complication of anesthesia    pt. reported 2015 penile inplant when getting some anesthesia had chest pain,they readjusted  the meds. and continued with surgery. Had no further problems di not have to follow up with anyone.   Depression    Depression with anxiety    Dupuytren's contracture of hand    ED (erectile dysfunction)    GERD (gastroesophageal reflux disease)    Glaucoma    HLD (hyperlipidemia)    Hypocontractile bladder    Hypothyroidism    MI (myocardial infarction)    denies   OSA on CPAP    Peyronie's disease    Rosacea    Skin cancer    BCC    Social History   Socioeconomic History   Marital status: Divorced    Spouse name: Not on file   Number of children: 2   Years of education: Not on file   Highest education level: Bachelor's degree (e.g., BA, AB, BS)  Occupational History   Occupation: Geologist, engineering    Comment: retired  Tobacco Use   Smoking status: Never   Smokeless tobacco: Never  Vaping Use   Vaping Use: Never used  Substance and Sexual Activity   Alcohol use: No   Drug use: No   Sexual activity: Not on file  Other Topics Concern   Not on file  Social History Narrative   Divorced, 2 children   Retired Art gallery manager   No EtOH, tobacco, drugs   Social Determinants of Health   Financial Resource Strain: Low Risk  (12/29/2022)   Overall Financial Resource Strain (CARDIA)    Difficulty of Paying Living Expenses: Not very hard  Food Insecurity: No Food Insecurity (12/30/2022)   Hunger Vital Sign    Worried About Running Out of Food in the Last Year: Never true    Ran Out of Food in the Last Year: Never true  Transportation Needs: Unmet Transportation Needs (01/21/2023)   PRAPARE - Administrator, Civil Service (Medical): Yes    Lack of Transportation (Non-Medical): No  Physical Activity: Inactive (12/29/2022)   Exercise Vital Sign    Days of Exercise per Week: 0 days    Minutes of Exercise per Session: 0 min  Stress: Stress Concern Present (12/29/2022)   [place]  Institute of Occupational Health - Occupational Stress Questionnaire    Feeling of Stress : To some extent  Social Connections: Moderately Integrated (12/29/2022)   Social Connection and Isolation Panel [NHANES]    Frequency of Communication with Friends and Family: More than three times a week    Frequency of Social Gatherings with Friends and Family: Twice a week    Attends Religious Services: More than 4 times per year    Active Member of Clubs or Organizations: Yes    Attends Banker Meetings: More than 4 times per year    Marital Status: Divorced  Intimate Partner Violence: Not At Risk (12/16/2022)   Humiliation, Afraid, Rape, and Kick questionnaire    Fear of Current or Ex-Partner: No    Emotionally Abused: No    Physically Abused: No    Sexually Abused: No    Past Surgical  History:  Procedure Laterality Date   CHOLECYSTECTOMY N/A 02/10/2021   Procedure: LAPAROSCOPIC CHOLECYSTECTOMY;  Surgeon: Abigail Miyamoto, MD;  Location: [facility] OR;  Service: General;  Laterality: N/A;   COLONOSCOPY WITH ESOPHAGOGASTRODUODENOSCOPY (EGD)     multiple   CORONARY STENT PLACEMENT     ICD IMPLANT N/A 12/20/2022   Procedure: ICD IMPLANT;  Surgeon: Marinus Maw, MD;  Location: [facility] INVASIVE CV LAB;  Service: Cardiovascular;  Laterality: N/A;   INNER EAR SURGERY     LAPAROSCOPIC CHOLECYSTECTOMY Bilateral 02/09/2021   Dr.blackmon   LASIK  02/1998   LEFT HEART CATH AND CORONARY ANGIOGRAPHY N/A 12/15/2022   Procedure: LEFT HEART CATH AND CORONARY ANGIOGRAPHY;  Surgeon: Marykay Lex, MD;  Location: [facility] INVASIVE CV LAB;  Service: Cardiovascular;  Laterality: N/A;   PENILE PROSTHESIS IMPLANT     TONSILECTOMY, ADENOIDECTOMY, BILATERAL MYRINGOTOMY AND TUBES     TONSILLECTOMY     VASECTOMY     WISDOM TOOTH EXTRACTION      Family History  Problem Relation Age of Onset   COPD Mother    Cancer Father        gallbladder cancer   Varicose Veins Father    Liver cancer Father    Basal cell carcinoma Father    Basal cell carcinoma Sister    Melanoma Sister    Rheum arthritis Maternal Grandmother    Heart failure Maternal Grandfather    Colon cancer Paternal Grandmother    Arthritis Paternal Grandfather    Colon cancer Paternal Grandfather    COPD Other     Allergies  Allergen Reactions   Bee Venom Hives and Swelling   Crestor [Rosuvastatin] Other (See Comments)    Myalgia.     Iodinated Contrast Media Hives and Itching   Sulfa Antibiotics Rash   Sulfonamide Derivatives Rash    Current Outpatient Medications on File Prior to Visit  Medication Sig Dispense Refill   acetaminophen (TYLENOL) 500 MG tablet Take 500 mg by mouth every 6 (six) hours as needed for mild pain.     aspirin EC 81 MG tablet Take 81 mg by mouth every morning.     Calcium Carb-Cholecalciferol (OYSTER  SHELL CALCIUM W/D) 500-5 MG-MCG TABS Take by mouth daily in the afternoon.     Digestive Enzymes (SUPER ENZYMES PO) Take 1 capsule by mouth daily. (Patient not taking: Reported on 03/10/2023)     latanoprost (XALATAN) 0.005 % ophthalmic solution Place 1 drop into both eyes at bedtime.     nitroGLYCERIN (NITROSTAT) 0.4 MG SL tablet Place 1 tablet (0.4 mg total) under the tongue every 5 (  five) minutes as needed for chest pain. (Patient not taking: Reported on 03/10/2023) 25 tablet 3   Probiotic Product (PROBIOTIC DAILY PO) Take 1 capsule by mouth daily in the afternoon.     Selenium 100 MCG TABS Take 100 mcg by mouth daily.     senna (SENOKOT) 8.6 MG tablet Take 1 tablet by mouth as needed for constipation.     Sodium Caprylate POWD Take 1 capsule by mouth daily in the afternoon.     SYNTHROID 137 MCG tablet Take 137 mcg by mouth daily before breakfast.     timolol (TIMOPTIC) 0.5 % ophthalmic solution Place 1 drop into both eyes 2 (two) times daily.     TURMERIC PO Take 1,500 mg by mouth daily.     venlafaxine XR (EFFEXOR-XR) 37.5 MG 24 hr capsule TAKE 3 CAPSULES BY MOUTH EVERY DAY (Patient taking differently: Take 112.5 mg by mouth daily.) 270 capsule 3   zinc gluconate 50 MG tablet Take 50 mg by mouth daily at 6 PM.     No current facility-administered medications on file prior to visit.    BP 100/60 (BP Location: Left Arm, Patient Position: Sitting, Cuff Size: Normal)   Pulse 60   Temp 97.7 F (36.5 C) (Oral)   Ht  (1.854 m)   Wt 197 lb 8 oz (89.6 kg)   SpO2 98%   BMI 26.06 kg/m chart      Objective:    BP 100/60 (BP Location: Left Arm, Patient Position: Sitting, Cuff Size: Normal)   Pulse 60   Temp 97.7 F (36.5 C) (Oral)   Ht  (1.854 m)   Wt 197 lb 8 oz (89.6 kg)   SpO2 98%   BMI 26.06 kg/m    Physical Exam Vitals reviewed.  Constitutional:      Appearance: Normal appearance. He is normal weight.  Cardiovascular:     Rate and Rhythm: Normal rate and regular  rhythm.  Pulmonary:     Effort: Pulmonary effort is normal.     Breath sounds: Normal breath sounds.  Musculoskeletal:     Cervical back: Normal range of motion and neck supple.  Skin:    Findings: Rash present.     Comments: Dry, patchy, pruritic areas to the arms and face  Neurological:     General: No focal deficit present.     Mental Status: He is alert and oriented to person, place, and time.  Psychiatric:        Mood and Affect: Mood normal.        Behavior: Behavior normal.    No results found for any visits on 03/22/23.      Assessment & Plan:   Problem List Items Addressed This Visit   None Visit Diagnoses     Allergic dermatitis    -  Primary       Meds ordered this encounter  Medications   traZODone (DESYREL) 50 MG tablet    Sig: Take 0.5-1 tablets (25-50 mg total) by mouth at bedtime as needed (insomnia).    Dispense:  90 tablet    Refill:  1   pantoprazole (PROTONIX) 40 MG tablet    Sig: Take 1 tablet (40 mg total) by mouth daily.    Dispense:  30 tablet    Refill:  3   We will see if stopping omeprazole helps. Start Protonix. Refer to dermatology if no better.  No follow-ups on file.  Eulis Foster, FNP

## 2023-03-22 NOTE — Patient Instructions (Signed)
Stop omeprazole.  I will send a prescription in for Protonix 40 mg.  Let us know within the next 1 to 2 weeks if the rash is better.  I have also renewed your trazodone for sleep at bedtime.

## 2023-03-23 ENCOUNTER — Ambulatory Visit (INDEPENDENT_AMBULATORY_CARE_PROVIDER_SITE_OTHER): Payer: Medicare Other

## 2023-03-23 DIAGNOSIS — I469 Cardiac arrest, cause unspecified: Secondary | ICD-10-CM

## 2023-03-23 LAB — CUP PACEART REMOTE DEVICE CHECK
Battery Remaining Longevity: 64 mo
Battery Remaining Percentage: 93 %
Battery Voltage: 3.17 V
Brady Statistic AP VP Percent: 1 %
Brady Statistic AP VS Percent: 70 %
Brady Statistic AS VP Percent: 1 %
Brady Statistic AS VS Percent: 30 %
Brady Statistic RA Percent Paced: 69 %
Brady Statistic RV Percent Paced: 1 %
Date Time Interrogation Session: 20240417040015
HighPow Impedance: 70 Ohm
HighPow Impedance: 70 Ohm
Implantable Lead Connection Status: 753985
Implantable Lead Connection Status: 753985
Implantable Lead Implant Date: 20240115
Implantable Lead Implant Date: 20240115
Implantable Lead Location: 753859
Implantable Lead Location: 753860
Implantable Lead Model: 7122
Implantable Pulse Generator Implant Date: 20240115
Lead Channel Impedance Value: 450 Ohm
Lead Channel Impedance Value: 480 Ohm
Lead Channel Pacing Threshold Amplitude: 0.5 V
Lead Channel Pacing Threshold Amplitude: 0.5 V
Lead Channel Pacing Threshold Pulse Width: 0.5 ms
Lead Channel Pacing Threshold Pulse Width: 0.5 ms
Lead Channel Sensing Intrinsic Amplitude: 5 mV
Lead Channel Sensing Intrinsic Amplitude: 6.7 mV
Lead Channel Setting Pacing Amplitude: 3.5 V
Lead Channel Setting Pacing Amplitude: 3.5 V
Lead Channel Setting Pacing Pulse Width: 0.5 ms
Lead Channel Setting Sensing Sensitivity: 0.5 mV
Pulse Gen Serial Number: 8956372

## 2023-03-31 ENCOUNTER — Encounter: Payer: Self-pay | Admitting: Pharmacist Clinician (PhC)/ Clinical Pharmacy Specialist

## 2023-03-31 ENCOUNTER — Other Ambulatory Visit: Payer: Self-pay | Admitting: Pharmacy Technician

## 2023-04-04 ENCOUNTER — Telehealth: Payer: Self-pay

## 2023-04-04 NOTE — Progress Notes (Unsigned)
Care Management & Coordination Services Pharmacy Note  04/05/2023 Name:  Matthew Pratt MRN:  [id number] DOB:  [birth year]  Summary: LDL not at goal <70 Denies any episodes of chest pain or signs of bleed C/o of ongoing skin irritation/itching but reports it does seem to be slowly improving   Recommendations/Changes made from today's visit: -Counseled on diet and exercise extensively for cholesterol lowering, as patient has proven to be intolerable of treatment options -Counseled on monitoring for signs of a bleed -Recommended OTC famotidine at bedtime in addition to protonix and claritin already being used  Follow up plan: General 2 months Pharmacist visit in 6-7 months   Subjective: Matthew Pratt is an 72 y.o. year old male who is a primary patient of Burchette, Elberta Fortis, MD.  The care coordination team was consulted for assistance with disease management and care coordination needs.    Engaged with patient by telephone for follow up visit.  Recent office visits: 03/22/23 Birdena Crandall, NP - For allergic dermatitis, stop omeprazole. START protonix  02/14/23 Evelena Peat, MD - For CAD, no med changes  01/03/23 Evelena Peat, MD - Hospital f/u for cardiac arrest and chest wall pain following CPR. Short term Norco started  Recent consult visits: 03/10/23 Weston Brass, MD (Cardio) - For routine follow up. STOP Metoprolol, Recommended continuing Leqvio (pt has since stopped due to skin rxn)  02/18/23 Primus Bravo, RN (Infusion Center) - For HLD, Wilber Bihari administered  01/06/23 Bernadene Person, NP (Cardio) - For cardiac arrest, no medication changes  01/04/23 Francis Dowse, PA-C (Cardio) - For cardiac arrest, no med changes  01/04/23 Phillips Hay, CPP (Cardio) - For HLD, start leqvio  12/15/22 Weston Brass, MD (Cardio) - FOR HLD, recommeded repatha but patient declined, referred to pharmacist to discuss [facility] visits: 12/15/22 Redge Gainer - For cardiac arrest, LOS 6  days, start metoprolol (pt since self-stopped, cardio aware)   Objective:  Lab Results  Component Value Date   CREATININE 1.10 12/21/2022   BUN 20 12/21/2022   GFR 77.45 08/13/2022   EGFR 67 02/16/2022   GFRNONAA >60 12/21/2022   GFRAA >60 07/01/2020   NA 136 12/21/2022   K 4.2 12/21/2022   CALCIUM 9.1 12/21/2022   CO2 25 12/21/2022   GLUCOSE 160 (H) 12/21/2022    Lab Results  Component Value Date/Time   HGBA1C 5.3 12/18/2022 04:24 AM   HGBA1C 5.6 08/13/2022 09:22 AM   GFR 77.45 08/13/2022 09:22 AM   GFR 62.80 11/23/2021 03:01 PM    Last diabetic Eye exam: No results found for: "HMDIABEYEEXA"  Last diabetic Foot exam: No results found for: "HMDIABFOOTEX"   Lab Results  Component Value Date   CHOL 170 12/18/2022   HDL 45 12/18/2022   LDLCALC 100 (H) 12/18/2022   TRIG 123 12/18/2022   CHOLHDL 3.8 12/18/2022       Latest Ref Rng & Units 12/17/2022    5:04 AM 12/16/2022    3:59 AM 12/15/2022    1:10 PM  Hepatic Function  Total Protein 6.5 - 8.1 g/dL 5.9  6.6  6.7   Albumin 3.5 - 5.0 g/dL 3.3  3.7  3.8   AST 15 - 41 U/L 76  135  83   ALT 0 - 44 U/L 107  177  82   Alk Phosphatase 38 - 126 U/L 59  74  80   Total Bilirubin 0.3 - 1.2 mg/dL 1.4  1.0  1.3   Bilirubin, Direct 0.0 - 0.2 mg/dL  0.2  0.2      Lab Results  Component Value Date/Time   TSH 3.82 02/22/2022 10:55 AM   TSH 0.29 (L) 12/15/2021 10:08 AM   FREET4 1.25 12/15/2021 10:08 AM       Latest Ref Rng & Units 12/21/2022    8:07 AM 12/20/2022   12:41 AM 12/19/2022    4:02 AM  CBC  WBC 4.0 - 10.5 K/uL 9.1  5.6  5.6   Hemoglobin 13.0 - 17.0 g/dL 16.1  09.6  04.5   Hematocrit 39.0 - 52.0 % 39.1  40.3  39.1   Platelets 150 - 400 K/uL 152  140  112     Lab Results  Component Value Date/Time   VD25OH 33.61 02/19/2021 09:24 AM   VD25OH 47.44 03/29/2019 12:43 PM   VITAMINB12 501 02/19/2021 09:24 AM   VITAMINB12 439 10/27/2018 10:14 AM    Clinical ASCVD: Yes  The ASCVD Risk score (Arnett DK, et al.,  2019) failed to calculate for the following reasons:   The patient has a prior MI or stroke diagnosis        02/18/2023   10:12 AM 02/14/2023    9:18 AM 01/21/2023    9:12 AM  Depression screen PHQ 2/9  Decreased Interest 0 1 0  Down, Depressed, Hopeless 1 1 1   PHQ - 2 Score 1 2 1   Altered sleeping  0   Tired, decreased energy  1   Change in appetite  0   Feeling bad or failure about yourself   0   Trouble concentrating  0   Moving slowly or fidgety/restless  1   Suicidal thoughts  0   PHQ-9 Score  4   Difficult doing work/chores  Not difficult at all      Social History   Tobacco Use  Smoking Status Never  Smokeless Tobacco Never   BP Readings from Last 3 Encounters:  03/22/23 100/60  03/10/23 110/74  02/18/23 115/76   Pulse Readings from Last 3 Encounters:  03/22/23 60  03/10/23 60  02/18/23 60   Wt Readings from Last 3 Encounters:  03/22/23 197 lb 8 oz (89.6 kg)  03/10/23 201 lb 3.2 oz (91.3 kg)  02/18/23 200 lb (90.7 kg)   BMI Readings from Last 3 Encounters:  03/22/23 26.06 kg/m  03/10/23 26.55 kg/m  02/18/23 26.39 kg/m    Allergies  Allergen Reactions   Bee Venom Hives and Swelling   Crestor [Rosuvastatin] Other (See Comments)    Myalgia.     Iodinated Contrast Media Hives and Itching   Sulfa Antibiotics Rash   Sulfonamide Derivatives Rash    Medications Reviewed Today     Reviewed by Sherrill Raring, RPH (Pharmacist) on 04/05/23 at 1048  Med List Status: <None>   Medication Order Taking? Sig Documenting Provider Last Dose Status Informant  acetaminophen (TYLENOL) 500 MG tablet [id number] No Take 500 mg by mouth every 6 (six) hours as needed for mild pain. [provider] Taking Active Self           Med Note Dione Housekeeper, Everette Rank Mar 10, 2023  2:47 PM) Patient takes 375 mg at night  aspirin EC 81 MG tablet [id number] No Take 81 mg by mouth every morning. [provider] Taking Active Self           Med Note Katrinka Blazing,  JEFFREY W   Fri Jun 27, 2020 11:32 PM)    Calcium Carb-Cholecalciferol (OYSTER SHELL CALCIUM W/D)  500-5 MG-MCG TABS [id number] No Take by mouth daily in the afternoon. [provider] Taking Active   Digestive Enzymes (SUPER ENZYMES PO) [id number] No Take 1 capsule by mouth daily.  Patient not taking: Reported on 03/10/2023   [provider] Not Taking Active Self  latanoprost (XALATAN) 0.005 % ophthalmic solution [id number] No Place 1 drop into both eyes at bedtime. [provider] Taking Active   nitroGLYCERIN (NITROSTAT) 0.4 MG SL tablet [id number] No Place 1 tablet (0.4 mg total) under the tongue every 5 (five) minutes as needed for chest pain.  Patient not taking: Reported on 03/10/2023   Joylene Grapes, NP Not Taking Active            Med Note Marcelina Morel Mar 10, 2023  2:51 PM) Patient has if needed  pantoprazole (PROTONIX) 40 MG tablet [id number]  Take 1 tablet (40 mg total) by mouth daily. Worthy Rancher B, FNP  Active   Probiotic Product (PROBIOTIC DAILY PO) [id number] No Take 1 capsule by mouth daily in the afternoon. [provider] Taking Active   Selenium 100 MCG TABS [id number] No Take 100 mcg by mouth daily. [provider] Taking Active Self  senna (SENOKOT) 8.6 MG tablet [id number] No Take 1 tablet by mouth as needed for constipation. [provider] Taking Active Self  Sodium Caprylate POWD [id number] No Take 1 capsule by mouth daily in the afternoon. [provider] Taking Active   SYNTHROID 137 MCG tablet [id number] No Take 137 mcg by mouth daily before breakfast. [provider] Taking Active   timolol (TIMOPTIC) 0.5 % ophthalmic solution [id number] No Place 1 drop into both eyes 2 (two) times daily. [provider] Taking Active Self  traZODone (DESYREL) 50 MG tablet [id number]  Take 0.5-1 tablets (25-50 mg total) by mouth at bedtime as needed (insomnia). Worthy Rancher B, FNP  Active   TURMERIC PO  [id number] No Take 1,500 mg by mouth daily. [provider] Taking Active Self  venlafaxine XR (EFFEXOR-XR) 37.5 MG 24 hr capsule [id number] No TAKE 3 CAPSULES BY MOUTH EVERY DAY  Patient taking differently: Take 112.5 mg by mouth daily.   Kristian Covey, MD Taking Active Self           Med Note Marcelina Morel Mar 10, 2023  2:46 PM) Patient take 3 capsules daily   zinc gluconate 50 MG tablet [id number] No Take 50 mg by mouth daily at 6 PM. [provider] Taking Active Self            SDOH:  (Social Determinants of Health) assessments and interventions performed: Yes SDOH Interventions    Flowsheet Row Care Coordination from 04/05/2023 in [facility] Care Coordination from 01/21/2023 in [facility] Coordination Telephone from 12/30/2022 in [facility] Coordination Telephone from 12/23/2022 in [facility] Coordination ED to Hosp-Admission (Discharged) from 12/15/2022 in [facility] CARDIOVASCULAR ICU Chronic Care Management from 08/23/2022 in [facility] [place]  SDOH Interventions        Food Insecurity Interventions Intervention Not Indicated -- Intervention Not Indicated Intervention Not Indicated Intervention Not Indicated --  Housing Interventions Intervention Not Indicated -- Intervention Not Indicated -- Intervention Not Indicated --  Transportation Interventions -- Other (Comment)  [No needs] Patient Refused  [Pt refused transportation resources at this time.] Other (Comment)  [pt unable to drive due to current medical condition-needs  transportation resources to get him to appts-urgent referral sent to care guide, pt will call [employer] to see if he has transportation benefit as well] Intervention Not Indicated Intervention Not Indicated  Utilities Interventions -- -- Intervention Not Indicated -- Intervention Not Indicated --       Medication  Assistance: None required.  Patient affirms current coverage meets needs.  Medication Access: Within the past 30 days, how often has patient missed a dose of medication? None Is a pillbox or other method used to improve adherence? Yes  Factors that may affect medication adherence? adverse effects of medications Are meds synced by current pharmacy? No  Are meds delivered by current pharmacy? No  Does patient experience delays in picking up medications due to transportation concerns? No   [facility] Reviewed: Is patient disadvantaged to use [facility]?: No  Current Rx insurance plan: [facility] Name and location of Current pharmacy:  [id number] [place], [place] - [address] AT CORNER OF [facility] [address] [place] [address] Phone: [phone] Fax: [phone]  [facility] [address] [place] [address] Phone: [phone] Fax: [phone]  [facility] [address] [place] [address] Phone: [phone] Fax: [phone]  [facility] services reviewed with patient today?: No  Patient requests to transfer care to [facility]?: No  Reason patient declined to change pharmacies: Disadvantaged due to insurance/mail order  Compliance/Adherence/Medication fill history: Care Gaps: AWV - completed 04/19/2022, scheduled 04/21/2023 Last BP - 100/60 on 03/22/2023  Star-Rating Drugs: None   Assessment/Plan   Hyperlipidemia: (LDL goal < 70) -Uncontrolled -Current treatment: None -Medications previously tried: zetia (?), repatha (fatigue), statins (myopathy), leqvio (skin rxn)  -Current dietary patterns: has added oatmeal, salmon to diet -Current exercise habits: Is working on getting back into walking and strengthening exercises -Educated on Cholesterol goals;  Importance of limiting foods high in cholesterol; Exercise goal of  150 minutes per week; -Counseled on diet and exercise extensively  CAD (Goal: Slow progression of atherosclerosis (plaques / blockages) throughout your body to reduce risk of heart attack and strokes) [place] controlled/uncontrolled: Controlled Current Medication Therapy: Aspirin 81mg  one tab daily Appropriate, Effective, Safe, Accessible Nitroglycerin 0.4mg  prn Appropriate, Effective, Safe, Accessible Medications Previously Tried: Metoprolol (fatigue, depression) Chest Pain in last 3 months: Yes Any signs of bleed? None Counseled to continue medication therapy and notify if any signs of a bleed    Sherrill Raring Clinical Pharmacist [phone]

## 2023-04-04 NOTE — Progress Notes (Signed)
Care Management & Coordination Services Pharmacy Team  Reason for Encounter: Appointment Reminder  Contacted patient to confirm telephone appointment with Delano Metz, PharmD on 04/05/2023 at 10:30. Unsuccessful outreach. Left voicemail for patient to return call.   Care Gaps: AWV - completed 04/19/2022, scheduled 04/21/2023 Last BP - 100/60 on 03/22/2023   Star Rating Drugs: None   Inetta Fermo [facility]  Clinical Pharmacist Assistant [phone]

## 2023-04-05 ENCOUNTER — Ambulatory Visit: Payer: Medicare Other

## 2023-04-10 ENCOUNTER — Encounter: Payer: Medicare Other | Admitting: Nurse Practitioner

## 2023-04-10 ENCOUNTER — Encounter (HOSPITAL_BASED_OUTPATIENT_CLINIC_OR_DEPARTMENT_OTHER): Payer: Self-pay | Admitting: Emergency Medicine

## 2023-04-10 ENCOUNTER — Other Ambulatory Visit: Payer: Self-pay

## 2023-04-10 ENCOUNTER — Emergency Department (HOSPITAL_BASED_OUTPATIENT_CLINIC_OR_DEPARTMENT_OTHER)
Admission: EM | Admit: 2023-04-10 | Discharge: 2023-04-10 | Disposition: A | Discharge status: Home / Self Care | Payer: Medicare Other | Attending: Emergency Medicine | Admitting: Emergency Medicine

## 2023-04-10 DIAGNOSIS — R6884 Jaw pain: Secondary | ICD-10-CM

## 2023-04-10 DIAGNOSIS — M26629 Arthralgia of temporomandibular joint, unspecified side: Secondary | ICD-10-CM | POA: Diagnosis not present

## 2023-04-10 DIAGNOSIS — R9431 Abnormal electrocardiogram [ECG] [EKG]: Secondary | ICD-10-CM | POA: Diagnosis not present

## 2023-04-10 DIAGNOSIS — M26609 Unspecified temporomandibular joint disorder, unspecified side: Secondary | ICD-10-CM | POA: Diagnosis not present

## 2023-04-10 LAB — BASIC METABOLIC PANEL
Anion gap: 7 (ref 5–15)
BUN: 17 mg/dL (ref 8–23)
CO2: 26 mmol/L (ref 22–32)
Calcium: 9.2 mg/dL (ref 8.9–10.3)
Chloride: 102 mmol/L (ref 98–111)
Creatinine, Ser: 1.04 mg/dL (ref 0.61–1.24)
GFR, Estimated: >60 mL/min (ref >60)
Glucose, Bld: 93 mg/dL (ref 70–99)
Potassium: 4.3 mmol/L (ref 3.5–5.1)
Sodium: 135 mmol/L (ref 135–145)

## 2023-04-10 LAB — CBC
HCT: 38.9 % — ABNORMAL LOW (ref 39.0–52.0)
Hemoglobin: 13.4 g/dL (ref 13.0–17.0)
MCH: 34.5 pg — ABNORMAL HIGH (ref 26.0–34.0)
MCHC: 34.4 g/dL (ref 30.0–36.0)
MCV: 100.3 fL — ABNORMAL HIGH (ref 80.0–100.0)
Platelets: 169 10*3/uL (ref 150–400)
RBC: 3.88 MIL/uL — ABNORMAL LOW (ref 4.22–5.81)
RDW: 12.2 % (ref 11.5–15.5)
WBC: 4.6 10*3/uL (ref 4.0–10.5)
nRBC: 0 % (ref 0.0–0.2)

## 2023-04-10 LAB — TROPONIN I (HIGH SENSITIVITY): Troponin I (High Sensitivity): 5 ng/L (ref <18)

## 2023-04-10 NOTE — Progress Notes (Signed)
Absolutely! Wishing you the best.   Because you are experiencing jaw pain and have a history of MI, I feel your condition warrants further evaluation and I recommend that you be seen in a face to face visit.   NOTE: There will be NO CHARGE for this eVisit   If you are having a true medical emergency please call 911.      For an urgent face to face visit, [facility] has eight urgent care centers for your convenience:    NEW!! [facility] Health Urgent Care Center at [facility] Get Driving Directions [phone] [address], [address] [place], [address]     [facility] Health Urgent Care Center at [facility] Get Driving Directions [phone] [address] [place], [place] [address]   [facility] Health Urgent Care Center [facility]) Get Driving Directions [phone] [address] [place], [place] [address]   [facility] Health Urgent Care Center [facility]) Get Driving Directions [phone] [address] [place],  [place]  [address]   [facility] Health Urgent Care Center [facility] - at [employer]  [phone] [address] [place],  [place] [address]     [facility] Health Urgent Care at [facility] Get Driving Directions [phone] [address], [address] [place], [place] [address]   [facility] Health Urgent Care at [facility] Get Driving Directions  [phone] [address] [place], [place] [address]   [facility] Health Urgent Care at [facility] Directions [phone] [address] [place], [place] [address]   Your MyChart E-visit questionnaire answers were reviewed by a board certified advanced clinical practitioner to complete your personal care plan based on your specific symptoms.  Thank you for using e-Visits.

## 2023-04-10 NOTE — ED Triage Notes (Signed)
Pt arrives pov, steady gait with c/o bilateral jaw pain, RT side worse with difficulty opening mouth x 4 days. Pt denies CP, endorses Abbott pacemaker

## 2023-04-10 NOTE — Discharge Instructions (Signed)
You were seen for your jaw pain in the emergency department.  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  Follow-up with your dentist in 2-3 days regarding your visit.    Return immediately to the emergency department if you experience any of the following: Chest pain, shortness of breath, vomiting, unexplained sweating, or any other concerning symptoms.    Thank you for visiting our Emergency Department. It was a pleasure taking care of you today.

## 2023-04-10 NOTE — Progress Notes (Signed)
I have spent 5 minutes in review of e-visit questionnaire, review and updating patient chart, medical decision making and response to patient.  ° °Matthew Pratt Trystin Terhune, NP ° °  °

## 2023-04-10 NOTE — ED Provider Notes (Signed)
[place] EMERGENCY DEPARTMENT AT [facility] Provider Note   CSN: [id number] Arrival date & time: 04/10/23  1053     History  Chief Complaint  Patient presents with   Jaw Pain    Matthew Pratt is a 72 y.o. male.  72 year old male with a history of cardiac arrest status post ICD placement in 12/2022 and CAD status post PCI who presents emergency department with jaw pain.  Patient reports that he has had bilateral jaw pain for the past 4 days.  Says it is worsened with opening his jaw.  Not had any voice changes, sore throat, fevers, or tooth pain.  No chest pain diaphoresis, vomiting or shortness of breath.  Says that the pain is not exertional.  Up-to-date on his tetanus vaccines.  Talk to a helpline who referred him into the emergency department due to his cardiac history and concerns for possible angina causing his symptoms.       Home Medications Prior to Admission medications   Medication Sig Start Date End Date Taking? Authorizing Provider  acetaminophen (TYLENOL) 500 MG tablet Take 500 mg by mouth every 6 (six) hours as needed for mild pain.    [provider]  aspirin EC 81 MG tablet Take 81 mg by mouth every morning.    [provider]  Calcium Carb-Cholecalciferol (OYSTER SHELL CALCIUM W/D) 500-5 MG-MCG TABS Take by mouth daily in the afternoon. 07/15/22   [provider]  Digestive Enzymes (SUPER ENZYMES PO) Take 1 capsule by mouth daily. Patient not taking: Reported on 03/10/2023    [provider]  latanoprost (XALATAN) 0.005 % ophthalmic solution Place 1 drop into both eyes at bedtime. 05/15/20   [provider]  nitroGLYCERIN (NITROSTAT) 0.4 MG SL tablet Place 1 tablet (0.4 mg total) under the tongue every 5 (five) minutes as needed for chest pain. Patient not taking: Reported on 03/10/2023 01/06/23 04/06/23  Joylene Grapes, NP  pantoprazole (PROTONIX) 40 MG tablet Take 1 tablet (40 mg total) by mouth daily. 03/22/23    Eulis Foster, FNP  Probiotic Product (PROBIOTIC DAILY PO) Take 1 capsule by mouth daily in the afternoon.    [provider]  Selenium 100 MCG TABS Take 100 mcg by mouth daily.    [provider]  senna (SENOKOT) 8.6 MG tablet Take 1 tablet by mouth as needed for constipation.    [provider]  Sodium Caprylate POWD Take 1 capsule by mouth daily in the afternoon. 07/15/22   [provider]  SYNTHROID 137 MCG tablet Take 137 mcg by mouth daily before breakfast. 02/18/23   [provider]  timolol (TIMOPTIC) 0.5 % ophthalmic solution Place 1 drop into both eyes 2 (two) times daily.    [provider]  traZODone (DESYREL) 50 MG tablet Take 0.5-1 tablets (25-50 mg total) by mouth at bedtime as needed (insomnia). 03/22/23   Worthy Rancher B, FNP  TURMERIC PO Take 1,500 mg by mouth daily.    [provider]  venlafaxine XR (EFFEXOR-XR) 37.5 MG 24 hr capsule TAKE 3 CAPSULES BY MOUTH EVERY DAY Patient taking differently: Take 112.5 mg by mouth daily. 10/18/22   Burchette, Elberta Fortis, MD  zinc gluconate 50 MG tablet Take 50 mg by mouth daily at 6 PM.    [provider]      Allergies    Bee venom, Crestor [rosuvastatin], Iodinated contrast media, Sulfa antibiotics, and Sulfonamide derivatives    Review of Systems   Review  of Systems  Physical Exam Updated Vital Signs BP 118/83   Pulse 60   Temp 98 F (36.7 C) (Oral)   Resp 14   Ht 6\' 1"  (1.854 m)   Wt 89.4 kg   SpO2 97%   BMI 25.99 kg/m  Physical Exam Vitals and nursing note reviewed.  Constitutional:      General: He is not in acute distress.    Appearance: He is well-developed.  HENT:     Head: Normocephalic and atraumatic.     Right Ear: External ear normal.     Left Ear: External ear normal.     Nose: Nose normal.     Mouth/Throat:     Mouth: Mucous membranes are moist.     Pharynx: Oropharynx is clear. No oropharyngeal exudate or posterior oropharyngeal  erythema.     Comments: No dental tenderness to palpation.  No significant trismus noted.  Small popping sensation on right TMJ when he opens his mouth fully. Eyes:     Extraocular Movements: Extraocular movements intact.     Conjunctiva/sclera: Conjunctivae normal.     Pupils: Pupils are equal, round, and reactive to light.  Cardiovascular:     Rate and Rhythm: Normal rate and regular rhythm.     Heart sounds: Normal heart sounds.  Pulmonary:     Effort: Pulmonary effort is normal. No respiratory distress.     Breath sounds: Normal breath sounds.  Abdominal:     Palpations: Abdomen is soft.  Musculoskeletal:     Cervical back: Normal range of motion and neck supple.     Right lower leg: No edema.     Left lower leg: No edema.  Skin:    General: Skin is warm and dry.  Neurological:     Mental Status: He is alert. Mental status is at baseline.  Psychiatric:        Mood and Affect: Mood normal.        Behavior: Behavior normal.     ED Results / Procedures / Treatments   Labs (all labs ordered are listed, but only abnormal results are displayed) Labs Reviewed  CBC - Abnormal; Notable for the following components:      Result Value   RBC 3.88 (*)    HCT 38.9 (*)    MCV 100.3 (*)    MCH 34.5 (*)    All other components within normal limits  BASIC METABOLIC PANEL  TROPONIN I (HIGH SENSITIVITY)    EKG EKG Interpretation  Date/Time:  Sunday Apr 10 2023 11:02:38 EDT Ventricular Rate:  60 PR Interval:  166 QRS Duration: 93 QT Interval:  422 QTC Calculation: 422 R Axis:   -11 Text Interpretation: Sinus rhythm Low voltage, precordial leads Confirmed by Vonita Moss [phone]) on 04/10/2023 11:17:06 AM  Radiology No results found.  Procedures Procedures   Medications Ordered in ED Medications - No data to display  ED Course/ Medical Decision Making/ A&P                             Medical Decision Making Amount and/or Complexity of Data Reviewed Labs:  ordered.   Matthew Pratt is a 72 y.o. 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  No signs of dental infection on exam that would be causing his symptoms.  Considered MI but is not having any anginal equivalents.  However, since he was referred here for evaluation for MI and does have a significant cardiac history which includes cardiac arrest will obtain a troponin and EKG at this time.  Plan:  Labs Troponin EKG  ED Summary/Re-evaluation:  Patient underwent the above workup and had normal EKG and troponin and labs.  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  This patient presents to the ED for concern of complaints listed in HPI, this involves an extensive number of treatment options, and is a complaint that carries with it a high risk of complications and morbidity. Disposition including potential need for admission considered.   Dispo: DC Home. Return precautions discussed including, but not limited to, those listed in the AVS. Allowed pt time to ask questions which were answered fully prior to dc.  Records reviewed Outpatient Clinic Notes The following labs were independently interpreted: Chemistry and show no acute abnormality I personally reviewed and interpreted the pt's EKG: see above for interpretation  I have reviewed the patients home medications and made adjustments as needed Social Determinants of health:  Elderly  Final Clinical Impression(s) / ED Diagnoses Final diagnoses:  TMJ (temporomandibular joint disorder)    Rx / DC Orders ED Discharge Orders     None         Rondel Baton, MD 04/10/23 1347

## 2023-04-11 ENCOUNTER — Ambulatory Visit: Payer: Medicare Other | Attending: Internal Medicine | Admitting: Internal Medicine

## 2023-04-11 VITALS — BP 110/70 | HR 65 | Ht 73.0 in | Wt 204.2 lb

## 2023-04-11 DIAGNOSIS — I495 Sick sinus syndrome: Secondary | ICD-10-CM

## 2023-04-11 DIAGNOSIS — Z95810 Presence of automatic (implantable) cardiac defibrillator: Secondary | ICD-10-CM | POA: Diagnosis not present

## 2023-04-11 DIAGNOSIS — I442 Atrioventricular block, complete: Secondary | ICD-10-CM

## 2023-04-11 LAB — CUP PACEART INCLINIC DEVICE CHECK
Battery Remaining Longevity: 88 mo
Brady Statistic RA Percent Paced: 68 %
Brady Statistic RV Percent Paced: 0.03 %
Date Time Interrogation Session: 20240506194043
HighPow Impedance: 72 Ohm
HighPow Impedance: 73.125
Implantable Lead Connection Status: 753985
Implantable Lead Connection Status: 753985
Implantable Lead Implant Date: 20240115
Implantable Lead Implant Date: 20240115
Implantable Lead Location: 753859
Implantable Lead Location: 753860
Implantable Lead Model: 7122
Implantable Pulse Generator Implant Date: 20240115
Lead Channel Impedance Value: 462.5 Ohm
Lead Channel Impedance Value: 487.5 Ohm
Lead Channel Pacing Threshold Amplitude: 0.5 V
Lead Channel Pacing Threshold Amplitude: 0.5 V
Lead Channel Pacing Threshold Amplitude: 0.5 V
Lead Channel Pacing Threshold Amplitude: 0.5 V
Lead Channel Pacing Threshold Pulse Width: 0.5 ms
Lead Channel Pacing Threshold Pulse Width: 0.5 ms
Lead Channel Pacing Threshold Pulse Width: 0.5 ms
Lead Channel Pacing Threshold Pulse Width: 0.5 ms
Lead Channel Sensing Intrinsic Amplitude: 5 mV
Lead Channel Sensing Intrinsic Amplitude: 7.1 mV
Lead Channel Setting Pacing Amplitude: 2 V
Lead Channel Setting Pacing Amplitude: 2.5 V
Lead Channel Setting Pacing Pulse Width: 0.5 ms
Lead Channel Setting Sensing Sensitivity: 0.5 mV
Pulse Gen Serial Number: 8956372

## 2023-04-11 NOTE — Patient Instructions (Signed)
Medication Instructions:  Your physician recommends that you continue on your current medications as directed. Please refer to the Current Medication list given to you today.  *If you need a refill on your cardiac medications before your next appointment, please call your pharmacy*  Lab Work: None ordered.  If you have labs (blood work) drawn today and your tests are completely normal, you will receive your results only by: MyChart Message (if you have MyChart) OR A paper copy in the mail If you have any lab test that is abnormal or we need to change your treatment, we will call you to review the results.  Testing/Procedures: None ordered.  Follow-Up: At [facility], you and your health needs are our priority.  As part of our continuing mission to provide you with exceptional heart care, we have created designated Provider Care Teams.  These Care Teams include your primary Cardiologist (physician) and Advanced Practice Providers (APPs -  Physician Assistants and Nurse Practitioners) who all work together to provide you with the care you need, when you need it.  We recommend signing up for the patient portal called "MyChart".  Sign up information is provided on this After Visit Summary.  MyChart is used to connect with patients for Virtual Visits (Telemedicine).  Patients are able to view lab/test results, encounter notes, upcoming appointments, etc.  Non-urgent messages can be sent to your provider as well.   To learn more about what you can do with MyChart, go to [email or web address].    Your next appointment:   1 year(s)  The format for your next appointment:   In Person  Provider:   Lewayne Bunting, MD{or one of the following Advanced Practice Providers on your designated Care Team:   Francis Dowse, [place] Casimiro Needle "Mardelle Matte" Lanna Poche, [place]  Remote monitoring is used to monitor your ICD from home. This monitoring reduces the number of office visits required to check your device to one  time per year. It allows [place] to keep an eye on the functioning of your device to ensure it is working properly. You are scheduled for a device check from home on 7/17. You may send your transmission at any time that day. If you have a wireless device, the transmission will be sent automatically. After your physician reviews your transmission, you will receive a postcard with your next transmission date.

## 2023-04-11 NOTE — Progress Notes (Signed)
HPI [name] returns today for followup. He is a pleasant 72 yo man with an aborted cardiac arrest s/p DDD ICD insertion. He has done well in the interim with no chest pain, sob, or syncope. He has gone back to exercising. He denies any ICD therapies. Allergies  Allergen Reactions   Bee Venom Hives and Swelling   Crestor [Rosuvastatin] Other (See Comments)    Myalgia.     Iodinated Contrast Media Hives and Itching   Sulfa Antibiotics Rash   Sulfonamide Derivatives Rash     Current Outpatient Medications  Medication Sig Dispense Refill   acetaminophen (TYLENOL) 500 MG tablet Take 500 mg by mouth every 6 (six) hours as needed for mild pain.     aspirin EC 81 MG tablet Take 81 mg by mouth every morning.     Calcium Carb-Cholecalciferol (OYSTER SHELL CALCIUM W/D) 500-5 MG-MCG TABS Take by mouth daily in the afternoon.     Digestive Enzymes (SUPER ENZYMES PO) Take 1 capsule by mouth daily.     latanoprost (XALATAN) 0.005 % ophthalmic solution Place 1 drop into both eyes at bedtime.     pantoprazole (PROTONIX) 40 MG tablet Take 1 tablet (40 mg total) by mouth daily. 30 tablet 3   Probiotic Product (PROBIOTIC DAILY PO) Take 1 capsule by mouth daily in the afternoon.     Selenium 100 MCG TABS Take 100 mcg by mouth daily.     senna (SENOKOT) 8.6 MG tablet Take 1 tablet by mouth as needed for constipation.     Sodium Caprylate POWD Take 1 capsule by mouth daily in the afternoon.     SYNTHROID 137 MCG tablet Take 137 mcg by mouth daily before breakfast.     timolol (TIMOPTIC) 0.5 % ophthalmic solution Place 1 drop into both eyes 2 (two) times daily.     traZODone (DESYREL) 50 MG tablet Take 0.5-1 tablets (25-50 mg total) by mouth at bedtime as needed (insomnia). 90 tablet 1   TURMERIC PO Take 1,500 mg by mouth daily.     venlafaxine XR (EFFEXOR-XR) 37.5 MG 24 hr capsule TAKE 3 CAPSULES BY MOUTH EVERY DAY (Patient taking differently: Take 112.5 mg by mouth daily.) 270 capsule 3   zinc  gluconate 50 MG tablet Take 50 mg by mouth daily at 6 PM.     nitroGLYCERIN (NITROSTAT) 0.4 MG SL tablet Place 1 tablet (0.4 mg total) under the tongue every 5 (five) minutes as needed for chest pain. (Patient not taking: Reported on 03/10/2023) 25 tablet 3   No current facility-administered medications for this visit.     Past Medical History:  Diagnosis Date   Anxiety    Arthritis    BPH (benign prostatic hypertrophy)    CAD (coronary artery disease)    s/p STEMI 10/15/10 w/ Promus DES placed x1 in the first OM   Colon polyps    Complication of anesthesia    pt. reported 2015 penile inplant when getting some anesthesia had chest pain,they readjusted  the meds. and continued with surgery. Had no further problems di not have to follow up with anyone.   Depression    Depression with anxiety    Dupuytren's contracture of hand    ED (erectile dysfunction)    GERD (gastroesophageal reflux disease)    Glaucoma    HLD (hyperlipidemia)    Hypocontractile bladder    Hypothyroidism    MI (myocardial infarction) (HCC)    denies   OSA on CPAP  Peyronie's disease    Rosacea    Skin cancer    BCC    ROS:   All systems reviewed and negative except as noted in the HPI.   Past Surgical History:  Procedure Laterality Date   CHOLECYSTECTOMY N/A 02/10/2021   Procedure: LAPAROSCOPIC CHOLECYSTECTOMY;  Surgeon: Abigail Miyamoto, MD;  Location: [facility] OR;  Service: General;  Laterality: N/A;   COLONOSCOPY WITH ESOPHAGOGASTRODUODENOSCOPY (EGD)     multiple   CORONARY STENT PLACEMENT     ICD IMPLANT N/A 12/20/2022   Procedure: ICD IMPLANT;  Surgeon: Marinus Maw, MD;  Location[facility] INVASIVE CV LAB;  Service: Cardiovascular;  Laterality: N/A;   INNER EAR SURGERY     LAPAROSCOPIC CHOLECYSTECTOMY Bilateral 02/09/2021   Dr.blackmon   LASIK  02/1998   LEFT HEART CATH AND CORONARY ANGIOGRAPHY N/A 12/15/2022   Procedure: LEFT HEART CATH AND CORONARY ANGIOGRAPHY;  Surgeon: Marykay Lex, MD;   [facility] INVASIVE CV LAB;  Service: Cardiovascular;  Laterality: N/A;   PENILE PROSTHESIS IMPLANT     TONSILECTOMY, ADENOIDECTOMY, BILATERAL MYRINGOTOMY AND TUBES     TONSILLECTOMY     VASECTOMY     WISDOM TOOTH EXTRACTION       Family History  Problem Relation Age of Onset   COPD Mother    Cancer Father        gallbladder cancer   Varicose Veins Father    Liver cancer Father    Basal cell carcinoma Father    Basal cell carcinoma Sister    Melanoma Sister    Rheum arthritis Maternal Grandmother    Heart failure Maternal Grandfather    Colon cancer Paternal Grandmother    Arthritis Paternal Grandfather    Colon cancer Paternal Grandfather    COPD Other      Social History   Socioeconomic History   Marital status: Divorced    Spouse name: Not on file   Number of children: 2   Years of education: Not on file   Highest education level: Bachelor's degree (e.g., BA, AB, BS)  Occupational History   Occupation: Contractor    Comment: retired  Tobacco Use   Smoking status: Never   Smokeless tobacco: Never  Vaping Use   Vaping Use: Never used  Substance and Sexual Activity   Alcohol use: Yes    Comment: rare   Drug use: No   Sexual activity: Not on file  Other Topics Concern   Not on file  Social History Narrative   Divorced, 2 children   Retired Art gallery manager   No EtOH, tobacco, drugs   Social Determinants of Health   Financial Resource Strain: Low Risk  (12/29/2022)   Overall Financial Resource Strain (CARDIA)    Difficulty of Paying Living Expenses: Not very hard  Food Insecurity: No Food Insecurity (04/05/2023)   Hunger Vital Sign    Worried About Running Out of Food in the Last Year: Never true    Ran Out of Food in the Last Year: Never true  Transportation Needs: Unmet Transportation Needs (01/21/2023)   PRAPARE - Administrator, Civil Service (Medical): Yes    Lack of Transportation (Non-Medical): No  Physical Activity:  Inactive (12/29/2022)   Exercise Vital Sign    Days of Exercise per Week: 0 days    Minutes of Exercise per Session: 0 min  Stress: Stress Concern Present (12/29/2022)   [employer] of Occupational Health - Occupational Stress Questionnaire    Feeling  of Stress : To some extent  Social Connections: Moderately Integrated (12/29/2022)   Social Connection and Isolation Panel [NHANES]    Frequency of Communication with Friends and Family: More than three times a week    Frequency of Social Gatherings with Friends and Family: Twice a week    Attends Religious Services: More than 4 times per year    Active Member of [employer] or Organizations: Yes    Attends Engineer, structural: More than 4 times per year    Marital Status: Divorced  Intimate Partner Violence: Not At Risk (12/16/2022)   Humiliation, Afraid, Rape, and Kick questionnaire    Fear of Current or Ex-Partner: No    Emotionally Abused: No    Physically Abused: No    Sexually Abused: No     BP 110/70   Pulse 65   Ht 6\' 1"  (1.854 m)   Wt 204 lb 3.2 oz (92.6 kg)   SpO2 98%   BMI 26.94 kg/m   Physical Exam:  Well appearing 72 yo man, NAD HEENT: Unremarkable Neck:  No JVD, no thyromegally Lymphatics:  No adenopathy Back:  No CVA tenderness Lungs:  Clear with no wheezes HEART:  Regular rate rhythm, no murmurs, no rubs, no clicks Abd:  soft, positive bowel sounds, no organomegally, no rebound, no guarding Ext:  2 plus pulses, no edema, no cyanosis, no clubbing Skin:  No rashes no nodules Neuro:  CN II through XII intact, motor grossly intact  EKG - nsr with atrial pacing  DEVICE  Normal device function.  See PaceArt for details.   Assess/Plan: Aborted VF arrest - he is s/p ICD insertion and is doing well. Heart block - he is conducting today. He will continue his current meds. ICD - his [employer]. Jude DDD ICD has been reprogrammed today to improve his rate response to exercise. HTN - his bp is well controlled. No  change in his meds.  Sharlot Gowda Sandie Swayze,MD

## 2023-04-14 ENCOUNTER — Telehealth: Payer: Self-pay | Admitting: Family Medicine

## 2023-04-14 NOTE — Telephone Encounter (Signed)
Contacted Matthew Pratt to schedule their annual wellness visit. Appointment made for 04/22/23.  Matthew Pratt AWV direct phone # [phone]   Due to schedule change 5/16 awv r/s to 5/17 Pt aware

## 2023-04-15 ENCOUNTER — Ambulatory Visit: Payer: Self-pay

## 2023-04-15 NOTE — Patient Outreach (Signed)
  Care Coordination   Follow Up Visit Note   04/15/2023 Name: Hayley Deshaies MRN: [id number] DOB: [birth year]  Albina Billet Glasper is a 72 y.o. year old male who sees Burchette, Elberta Fortis, MD for primary care. I spoke with  Albina Billet Otero by phone today.  What matters to the patients health and wellness today?  Staying health    Goals Addressed               This Visit's Progress     CAD and HTN management (pt-stated)        Care Coordination Interventions: Provided education on importance of blood pressure control in management of CAD Provided education on Importance of limiting foods high in cholesterol Reviewed Importance of taking all medications as prescribed Reviewed Importance of attending all scheduled provider appointments Advised to report any changes in symptoms or exercise tolerance  Patient doing well. Exercising regularly.  Recent allergic reaction to Leqvio.  Diet control for cholesterol at this time.           SDOH assessments and interventions completed:  Yes  SDOH Interventions Today    Flowsheet Row Most Recent Value  SDOH Interventions   Utilities Interventions Intervention Not Indicated        Care Coordination Interventions:  Yes, provided   Follow up plan: Follow up call scheduled for July    Encounter Outcome:  Pt. Visit Completed   Bary Leriche, RN, MSN [facility] Care Management Care Management Coordinator Direct Line [phone]

## 2023-04-15 NOTE — Patient Instructions (Signed)
Visit Information  Thank you for taking time to visit with me today. Please don't hesitate to contact me if I can be of assistance to you.   Following are the goals we discussed today:   Goals Addressed               This Visit's Progress     CAD and HTN management (pt-stated)        Care Coordination Interventions: Provided education on importance of blood pressure control in management of CAD Provided education on Importance of limiting foods high in cholesterol Reviewed Importance of taking all medications as prescribed Reviewed Importance of attending all scheduled provider appointments Advised to report any changes in symptoms or exercise tolerance  Patient doing well. Exercising regularly.  Recent allergic reaction to Leqvio.  Diet control for cholesterol at this time.           Our next appointment is by telephone on June 17, 2023 at 1030 am  Please call the care guide team at [phone] if you need to cancel or reschedule your appointment.   If you are experiencing a Mental Health or Behavioral Health Crisis or need someone to talk to, please call the Suicide and Crisis Lifeline: 988   Patient verbalizes understanding of instructions and care plan provided today and agrees to view in MyChart. Active MyChart status and patient understanding of how to access instructions and care plan via MyChart confirmed with patient.     The patient has been provided with contact information for the care management team and has been advised to call with any health related questions or concerns.   Bary Leriche, RN, MSN [facility] Care Management Care Management Coordinator Direct Line [phone]

## 2023-04-22 ENCOUNTER — Ambulatory Visit (INDEPENDENT_AMBULATORY_CARE_PROVIDER_SITE_OTHER): Payer: Medicare Other

## 2023-04-22 VITALS — BP 120/60 | HR 60 | Temp 98.1°F | Ht 73.0 in | Wt 202.5 lb

## 2023-04-22 DIAGNOSIS — Z0000 Encounter for general adult medical examination without abnormal findings: Secondary | ICD-10-CM | POA: Diagnosis not present

## 2023-04-22 NOTE — Patient Instructions (Addendum)
[name] , Thank you for taking time to come for your Medicare Wellness Visit. I appreciate your ongoing commitment to your health goals. Please review the following plan we discussed and let me know if I can assist you in the future.   These are the goals we discussed:  Goals       CAD and HTN management (pt-stated)      Care Coordination Interventions: Provided education on importance of blood pressure control in management of CAD Provided education on Importance of limiting foods high in cholesterol Reviewed Importance of taking all medications as prescribed Reviewed Importance of attending all scheduled provider appointments Advised to report any changes in symptoms or exercise tolerance  Patient doing well. Exercising regularly.  Recent allergic reaction to Leqvio.  Diet control for cholesterol at this time.         Patient stated (pt-stated)      I would like to stay independent.       Weight (lb) < 200 lb (90.7 kg) (pt-stated)      I want to lose weight and work from home.      Weight Loss Achieved      Evidence-based guidance:  Review medication that may contribute to weight gain, such as corticosteroid, beta-blocker, tricyclic antidepressant, oral antihyperglycemic; advocate for changes when appropriate.  Perform or refer to registered dietitian to perform comprehensive nutrition assessment that includes disordered-eating behaviors, such as binge-eating, emotional or compulsive eating, grazing.  Counsel patient regarding health risks of obesity and that weight loss goal of 5 to 10 percent of initial weight will improve risk.  Recommend initial weight loss goal of 3 to 5 percent of bodyweight; increase weight-loss goals based on patient success as achieving greater weight loss continues to reduce risk.  Propose a calorie-reduced diet based on the patient's preferences and health status.  Provide ongoing emotional support or cognitive behavioral therapy and dietitian services  (individual, group, virtual) over at least 6 months with a minimum of 14 encounters to best facilitate weight loss.  Provide monthly follow-up for 12 months when weight loss goal is met to assist with maintenance of weight loss.  Encourage increased physical activity or exercise based on individual age, risk, and ability up to 200 to 300 minutes per week that includes aerobic and resistance training.  Encourage reduction in sedentary behaviors by replacing them with nonexercise yet active leisure pursuits.  Identify physical barriers, such as change in posture, balance, gait patterns, joint pain, and environmental barriers to activity.  Consider referral to rehabilitation therapy, especially when mobility or function is impaired due to osteoarthritis and obesity.  Consider referral to weight-loss program that has published evidence of safety and efficacy if on-site intensive intervention is unavailable or patient preference.  Prepare patient for use of pharmacologic therapy as an adjunct to lifestyle changes based on body mass index, patient agreement and presence of risk factors or comorbidities.  Evaluate efficacy of pharmacologic therapy (weight loss) and tolerance to medication periodically.  Engage in shared decision-making regarding referral to bariatric surgeon for consultation and evaluation when weight-loss goal has not been accomplished by behavioral therapy with or without pharmacologic therapy.   Notes:         This is a list of the screening recommended for you and due dates:  Health Maintenance  Topic Date Due   Flu Shot  07/07/2023   Medicare Annual Wellness Visit  04/21/2024   Colon Cancer Screening  07/11/2028   DTaP/Tdap/Td vaccine (2 -  Td or Tdap) 08/21/2028   Pneumonia Vaccine  Completed   Hepatitis C Screening: USPSTF Recommendation to screen - Ages 39-79 yo.  Completed   Zoster (Shingles) Vaccine  Completed   HPV Vaccine  Aged Out   COVID-19 Vaccine  Discontinued     Advanced directives: In chart  Conditions/risks identified: None  Next appointment: Follow up in one year for your annual wellness visit.   Preventive Care 25 Years and Older, Male  Preventive care refers to lifestyle choices and visits with your health care provider that can promote health and wellness. What does preventive care include? A yearly physical exam. This is also called an annual well check. Dental exams once or twice a year. Routine eye exams. Ask your health care provider how often you should have your eyes checked. Personal lifestyle choices, including: Daily care of your teeth and gums. Regular physical activity. Eating a healthy diet. Avoiding tobacco and drug use. Limiting alcohol use. Practicing safe sex. Taking low doses of aspirin every day. Taking vitamin and mineral supplements as recommended by your health care provider. What happens during an annual well check? The services and screenings done by your health care provider during your annual well check will depend on your age, overall health, lifestyle risk factors, and family history of disease. Counseling  Your health care provider may ask you questions about your: Alcohol use. Tobacco use. Drug use. Emotional well-being. Home and relationship well-being. Sexual activity. Eating habits. History of falls. Memory and ability to understand (cognition). Work and work Astronomer. Screening  You may have the following tests or measurements: Height, weight, and BMI. Blood pressure. Lipid and cholesterol levels. These may be checked every 5 years, or more frequently if you are over 64 years old. Skin check. Lung cancer screening. You may have this screening every year starting at age 76 if you have a 30-pack-year history of smoking and currently smoke or have quit within the past 15 years. Fecal occult blood test (FOBT) of the stool. You may have this test every year starting at age 65. Flexible  sigmoidoscopy or colonoscopy. You may have a sigmoidoscopy every 5 years or a colonoscopy every 10 years starting at age 78. Prostate cancer screening. Recommendations will vary depending on your family history and other risks. Hepatitis C blood test. Hepatitis B blood test. Sexually transmitted disease (STD) testing. Diabetes screening. This is done by checking your blood sugar (glucose) after you have not eaten for a while (fasting). You may have this done every 1-3 years. Abdominal aortic aneurysm (AAA) screening. You may need this if you are a current or former smoker. Osteoporosis. You may be screened starting at age 68 if you are at high risk. Talk with your health care provider about your test results, treatment options, and if necessary, the need for more tests. Vaccines  Your health care provider may recommend certain vaccines, such as: Influenza vaccine. This is recommended every year. Tetanus, diphtheria, and acellular pertussis (Tdap, Td) vaccine. You may need a Td booster every 10 years. Zoster vaccine. You may need this after age 69. Pneumococcal 13-valent conjugate (PCV13) vaccine. One dose is recommended after age 30. Pneumococcal polysaccharide (PPSV23) vaccine. One dose is recommended after age 76. Talk to your health care provider about which screenings and vaccines you need and how often you need them. This information is not intended to replace advice given to you by your health care provider. Make sure you discuss any questions you have with your health  care provider. Document Released: 12/19/2015 Document Revised: 08/11/2016 Document Reviewed: 09/23/2015 Elsevier Interactive Patient Education  2017 [employer].  Fall Prevention in the Home Falls can cause injuries. They can happen to people of all ages. There are many things you can do to make your home safe and to help prevent falls. What can I do on the outside of my home? Regularly fix the edges of walkways and  driveways and fix any cracks. Remove anything that might make you trip as you walk through a door, such as a raised step or threshold. Trim any bushes or trees on the path to your home. Use bright outdoor lighting. Clear any walking paths of anything that might make someone trip, such as rocks or tools. Regularly check to see if handrails are loose or broken. Make sure that both sides of any steps have handrails. Any raised decks and porches should have guardrails on the edges. Have any leaves, snow, or ice cleared regularly. Use sand or salt on walking paths during winter. Clean up any spills in your garage right away. This includes oil or grease spills. What can I do in the bathroom? Use night lights. Install grab bars by the toilet and in the tub and shower. Do not use towel bars as grab bars. Use non-skid mats or decals in the tub or shower. If you need to sit down in the shower, use a plastic, non-slip stool. Keep the floor dry. Clean up any water that spills on the floor as soon as it happens. Remove soap buildup in the tub or shower regularly. Attach bath mats securely with double-sided non-slip rug tape. Do not have throw rugs and other things on the floor that can make you trip. What can I do in the bedroom? Use night lights. Make sure that you have a light by your bed that is easy to reach. Do not use any sheets or blankets that are too big for your bed. They should not hang down onto the floor. Have a firm chair that has side arms. You can use this for support while you get dressed. Do not have throw rugs and other things on the floor that can make you trip. What can I do in the kitchen? Clean up any spills right away. Avoid walking on wet floors. Keep items that you use a lot in easy-to-reach places. If you need to reach something above you, use a strong step stool that has a grab bar. Keep electrical cords out of the way. Do not use floor polish or wax that makes floors  slippery. If you must use wax, use non-skid floor wax. Do not have throw rugs and other things on the floor that can make you trip. What can I do with my stairs? Do not leave any items on the stairs. Make sure that there are handrails on both sides of the stairs and use them. Fix handrails that are broken or loose. Make sure that handrails are as long as the stairways. Check any carpeting to make sure that it is firmly attached to the stairs. Fix any carpet that is loose or worn. Avoid having throw rugs at the top or bottom of the stairs. If you do have throw rugs, attach them to the floor with carpet tape. Make sure that you have a light switch at the top of the stairs and the bottom of the stairs. If you do not have them, ask someone to add them for you. What else can I  do to help prevent falls? Wear shoes that: Do not have high heels. Have rubber bottoms. Are comfortable and fit you well. Are closed at the toe. Do not wear sandals. If you use a stepladder: Make sure that it is fully opened. Do not climb a closed stepladder. Make sure that both sides of the stepladder are locked into place. Ask someone to hold it for you, if possible. Clearly mark and make sure that you can see: Any grab bars or handrails. First and last steps. Where the edge of each step is. Use tools that help you move around (mobility aids) if they are needed. These include: Canes. Walkers. Scooters. Crutches. Turn on the lights when you go into a dark area. Replace any light bulbs as soon as they burn out. Set up your furniture so you have a clear path. Avoid moving your furniture around. If any of your floors are uneven, fix them. If there are any pets around you, be aware of where they are. Review your medicines with your doctor. Some medicines can make you feel dizzy. This can increase your chance of falling. Ask your doctor what other things that you can do to help prevent falls. This information is not  intended to replace advice given to you by your health care provider. Make sure you discuss any questions you have with your health care provider. Document Released: 09/18/2009 Document Revised: 04/29/2016 Document Reviewed: 12/27/2014 Elsevier Interactive Patient Education  2017 [employer].

## 2023-04-22 NOTE — Progress Notes (Signed)
Subjective:   Matthew Pratt is a 72 y.o. male who presents for Medicare Annual/Subsequent preventive examination.  Review of Systems      Cardiac Risk Factors include: advanced age (>17men, >45 women);male gender     Objective:    Today's Vitals   04/22/23 1230 04/22/23 1241  BP: 120/60   Pulse: 60   Temp: 98.1 F (36.7 C)   TempSrc: Oral   SpO2: 96%   Weight: 202 lb 8 oz (91.9 kg)   Height: 6\' 1"  (1.854 m)   PainSc:  0-No pain   Body mass index is 26.72 kg/m.     04/22/2023   12:55 PM 04/10/2023   11:07 AM 12/16/2022    2:00 AM 05/05/2022   12:35 PM 04/19/2022    2:01 PM 04/17/2021   10:07 AM 02/10/2021    6:29 AM  Advanced Directives  Does Patient Have a Medical Advance Directive? Yes Yes Unable to assess, patient is non-responsive or altered mental status Yes Yes Yes Yes  Type of Advance Directive Healthcare Power of [place];Living will   Living will Healthcare Power of [place];Living will Healthcare Power of [place];Living will Living will  Does patient want to make changes to medical advance directive? No - Patient declined   No - Patient declined No - Patient declined  No - Patient declined  Copy of Healthcare Power of Attorney in Chart? Yes - validated most recent copy scanned in chart (See row information)    No - copy requested No - copy requested     Current Medications (verified) Outpatient Encounter Medications as of 04/22/2023  Medication Sig   acetaminophen (TYLENOL) 500 MG tablet Take 500 mg by mouth every 6 (six) hours as needed for mild pain.   aspirin EC 81 MG tablet Take 81 mg by mouth every morning.   Calcium Carb-Cholecalciferol (OYSTER SHELL CALCIUM W/D) 500-5 MG-MCG TABS Take by mouth daily in the afternoon.   Digestive Enzymes (SUPER ENZYMES PO) Take 1 capsule by mouth daily.   latanoprost (XALATAN) 0.005 % ophthalmic solution Place 1 drop into both eyes at bedtime.   nitroGLYCERIN (NITROSTAT) 0.4 MG SL tablet Place 1 tablet (0.4 mg total)  under the tongue every 5 (five) minutes as needed for chest pain. (Patient not taking: Reported on 03/10/2023)   pantoprazole (PROTONIX) 40 MG tablet Take 1 tablet (40 mg total) by mouth daily.   Probiotic Product (PROBIOTIC DAILY PO) Take 1 capsule by mouth daily in the afternoon.   Selenium 100 MCG TABS Take 100 mcg by mouth daily.   senna (SENOKOT) 8.6 MG tablet Take 1 tablet by mouth as needed for constipation.   SYNTHROID 137 MCG tablet Take 137 mcg by mouth daily before breakfast.   timolol (TIMOPTIC) 0.5 % ophthalmic solution Place 1 drop into both eyes 2 (two) times daily.   traZODone (DESYREL) 50 MG tablet Take 0.5-1 tablets (25-50 mg total) by mouth at bedtime as needed (insomnia).   TURMERIC PO Take 1,500 mg by mouth daily.   venlafaxine XR (EFFEXOR-XR) 37.5 MG 24 hr capsule TAKE 3 CAPSULES BY MOUTH EVERY DAY (Patient taking differently: Take 112.5 mg by mouth daily.)   zinc gluconate 50 MG tablet Take 50 mg by mouth daily at 6 PM.   [DISCONTINUED] Sodium Caprylate POWD Take 1 capsule by mouth daily in the afternoon.   No facility-administered encounter medications on file as of 04/22/2023.    Allergies (verified) Bee venom, Crestor [rosuvastatin], Iodinated contrast media, Sulfa antibiotics, and Sulfonamide  derivatives   History: Past Medical History:  Diagnosis Date   Anxiety    Arthritis    BPH (benign prostatic hypertrophy)    CAD (coronary artery disease)    s/p STEMI 10/15/10 w/ Promus DES placed x1 in the first OM   Colon polyps    Complication of anesthesia    pt. reported 2015 penile inplant when getting some anesthesia had chest pain,they readjusted  the meds. and continued with surgery. Had no further problems di not have to follow up with anyone.   Depression    Depression with anxiety    Dupuytren's contracture of hand    ED (erectile dysfunction)    GERD (gastroesophageal reflux disease)    Glaucoma    HLD (hyperlipidemia)    Hypocontractile bladder     Hypothyroidism    MI (myocardial infarction) (HCC)    denies   OSA on CPAP    Peyronie's disease    Rosacea    Skin cancer    BCC   Past Surgical History:  Procedure Laterality Date   CHOLECYSTECTOMY N/A 02/10/2021   Procedure: LAPAROSCOPIC CHOLECYSTECTOMY;  Surgeon: Abigail Miyamoto, MD;  Location: [facility] OR;  Service: General;  Laterality: N/A;   COLONOSCOPY WITH ESOPHAGOGASTRODUODENOSCOPY (EGD)     multiple   CORONARY STENT PLACEMENT     ICD IMPLANT N/A 12/20/2022   Procedure: ICD IMPLANT;  Surgeon: Marinus Maw, MD;  Location: [facility] INVASIVE CV LAB;  Service: Cardiovascular;  Laterality: N/A;   INNER EAR SURGERY     LAPAROSCOPIC CHOLECYSTECTOMY Bilateral 02/09/2021   Dr.blackmon   LASIK  02/1998   LEFT HEART CATH AND CORONARY ANGIOGRAPHY N/A 12/15/2022   Procedure: LEFT HEART CATH AND CORONARY ANGIOGRAPHY;  Surgeon: Marykay Lex, MD;  Location: [facility] INVASIVE CV LAB;  Service: Cardiovascular;  Laterality: N/A;   PENILE PROSTHESIS IMPLANT     TONSILECTOMY, ADENOIDECTOMY, BILATERAL MYRINGOTOMY AND TUBES     TONSILLECTOMY     VASECTOMY     WISDOM TOOTH EXTRACTION     Family History  Problem Relation Age of Onset   COPD Mother    Cancer Father        gallbladder cancer   Varicose Veins Father    Liver cancer Father    Basal cell carcinoma Father    Basal cell carcinoma Sister    Melanoma Sister    Rheum arthritis Maternal Grandmother    Heart failure Maternal Grandfather    Colon cancer Paternal Grandmother    Arthritis Paternal Grandfather    Colon cancer Paternal Grandfather    COPD Other    Social History   Socioeconomic History   Marital status: Divorced    Spouse name: Not on file   Number of children: 2   Years of education: Not on file   Highest education level: Bachelor's degree (e.g., BA, AB, BS)  Occupational History   Occupation: Contractor    Comment: retired  Tobacco Use   Smoking status: Never   Smokeless tobacco: Never   Vaping Use   Vaping Use: Never used  Substance and Sexual Activity   Alcohol use: Yes    Comment: rare   Drug use: No   Sexual activity: Not on file  Other Topics Concern   Not on file  Social History Narrative   Divorced, 2 children   Retired Art gallery manager   No EtOH, tobacco, drugs   Social Determinants of Health   Financial Resource Strain: Low Risk  (04/22/2023)  Overall Financial Resource Strain (CARDIA)    Difficulty of Paying Living Expenses: Not hard at all  Food Insecurity: No Food Insecurity (04/22/2023)   Hunger Vital Sign    Worried About Running Out of Food in the Last Year: Never true    Ran Out of Food in the Last Year: Never true  Transportation Needs: No Transportation Needs (04/22/2023)   PRAPARE - Administrator, Civil Service (Medical): No    Lack of Transportation (Non-Medical): No  Physical Activity: Sufficiently Active (04/22/2023)   Exercise Vital Sign    Days of Exercise per Week: 4 days    Minutes of Exercise per Session: 60 min  Stress: No Stress Concern Present (04/22/2023)   [employer] of Occupational Health - Occupational Stress Questionnaire    Feeling of Stress : Not at all  Social Connections: Moderately Integrated (04/22/2023)   Social Connection and Isolation Panel [NHANES]    Frequency of Communication with Friends and Family: More than three times a week    Frequency of Social Gatherings with Friends and Family: More than three times a week    Attends Religious Services: More than 4 times per year    Active Member of [employer] or Organizations: Yes    Attends Engineer, structural: More than 4 times per year    Marital Status: Divorced    Tobacco Counseling Counseling given: Not Answered   Clinical Intake:  Pre-visit preparation completed: Yes  Pain : No/denies pain Pain Score: 0-No pain     BMI - recorded: 26.72 Nutritional Status: BMI 25 -29 Overweight Nutritional Risks: None Diabetes: No  How often do  you need to have someone help you when you read instructions, pamphlets, or other written materials from your doctor or pharmacy?: 1 - Never  Diabetic?  No  Interpreter Needed?: No  Information entered by :: Theresa Mulligan LPN   Activities of Daily Living    04/22/2023   12:48 PM 04/15/2023    9:07 AM  In your present state of health, do you have any difficulty performing the following activities:  Hearing? 1 0  Comment Wears hearing aids   Vision? 0 0  Difficulty concentrating or making decisions? 0 0  Walking or climbing stairs? 0 0  Dressing or bathing? 0 0  Doing errands, shopping? 0 0  Preparing Food and eating ? N N  Using the Toilet? N N  In the past six months, have you accidently leaked urine? N N  Do you have problems with loss of bowel control? N N  Managing your Medications? N N  Managing your Finances? N N  Housekeeping or managing your Housekeeping? N N    Patient Care Team: Kristian Covey, MD as PCP - General (Family Medicine) Parke Poisson, MD as PCP - Cardiology (Cardiology) Fleeta Emmer, RN as [facility] [place], Milas Kocher, [facility] (Pharmacist)  Indicate any recent Medical Services you may have received from other than [facility] providers in the past year (date may be approximate).     Assessment:   This is a routine wellness examination for Shaul.  Hearing/Vision screen Hearing Screening - Comments:: Wears hearing aids Vision Screening - Comments:: Wears rx glasses - up to date with routine eye exams with Dr Francene Boyers   Dietary issues and exercise activities discussed: Current Exercise Habits: Home exercise routine, Time (Minutes): 60, Frequency (Times/Week): 4, Weekly Exercise (Minutes/Week): 240, Intensity: Moderate, Exercise limited by: None identified   Goals Addressed  This Visit's Progress     Patient stated (pt-stated)        I would like to stay independent.        Depression Screen     04/22/2023   12:47 PM 04/15/2023   10:17 AM 02/18/2023   10:12 AM 02/14/2023    9:18 AM 01/21/2023    9:12 AM 12/30/2022    9:15 AM 08/13/2022    9:06 AM  PHQ 2/9 Scores  PHQ - 2 Score 0 1 1 2 1  0 1  PHQ- 9 Score    4   5    Fall Risk    04/22/2023   12:49 PM 04/15/2023    9:07 AM 03/22/2023   11:35 AM 02/14/2023    9:18 AM 12/29/2022   12:18 PM  Fall Risk   Falls in the past year? 1 0 1 1 1   Number falls in past yr: 0 0 0 0 0  Injury with Fall? 1 1 1 1 1   Comment Head and lower back injury, followed by medical attention.      Risk for fall due to : Mental status change  No Fall Risks No Fall Risks   Follow up Falls prevention discussed  Falls evaluation completed Falls evaluation completed     FALL RISK PREVENTION PERTAINING TO THE HOME:  Any stairs in or around the home? Yes  If so, are there any without handrails? No  Home free of loose throw rugs in walkways, pet beds, electrical cords, etc? Yes  Adequate lighting in your home to reduce risk of falls? Yes   ASSISTIVE DEVICES UTILIZED TO PREVENT FALLS:  Life alert? Yes  Use of a cane, walker or w/c? No  Grab bars in the bathroom? Yes  Shower chair or bench in shower? Yes  Elevated toilet seat or a handicapped toilet? Yes   TIMED UP AND GO:  Was the test performed? Yes .  Length of time to ambulate 10 feet: 10 sec.   Gait steady and fast without use of assistive device  Cognitive Function:        04/22/2023   12:55 PM 04/19/2022    2:02 PM  6CIT Screen  What Year? 0 points 0 points  What month? 0 points 0 points  What time? 0 points 0 points  Count back from 20 0 points 0 points  Months in reverse 0 points 0 points  Repeat phrase 0 points 0 points  Total Score 0 points 0 points    Immunizations Immunization History  Administered Date(s) Administered   Fluad Quad(high Dose 65+) 08/10/2019, 08/13/2022   Influenza, High Dose Seasonal PF 09/18/2020, 08/18/2021   Influenza-Unspecified 09/13/2016, 10/04/2017,  07/28/2018, 09/18/2020   PFIZER Comirnaty(Gray Top)Covid-19 Tri-Sucrose Vaccine 04/21/2021   PFIZER(Purple Top)SARS-COV-2 Vaccination 01/27/2020, 02/20/2020, 09/18/2020, 08/18/2021   PNEUMOCOCCAL CONJUGATE-20 07/22/2022   Pfizer Covid-19 Vaccine Bivalent Booster 91yrs & up 08/18/2021   Pneumococcal Conjugate-13 06/16/2017, 06/16/2017   Pneumococcal Polysaccharide-23 08/10/2019   Tdap 08/21/2018   Zoster Recombinat (Shingrix) 11/13/2019, 04/09/2020    TDAP status: Up to date  Flu Vaccine status: Up to date  Pneumococcal vaccine status: Up to date    Qualifies for Shingles Vaccine? Yes   Zostavax completed Yes   Shingrix Completed?: Yes  Screening Tests Health Maintenance  Topic Date Due   INFLUENZA VACCINE  07/07/2023   Medicare Annual Wellness (AWV)  04/21/2024   COLONOSCOPY (Pts 45-71yrs Insurance coverage will need to be confirmed)  07/11/2028   DTaP/Tdap/Td (2 -  Td or Tdap) 08/21/2028   Pneumonia Vaccine 18+ Years old  Completed   Hepatitis C Screening  Completed   Zoster Vaccines- Shingrix  Completed   HPV VACCINES  Aged Out   COVID-19 Vaccine  Discontinued    Health Maintenance  There are no preventive care reminders to display for this patient.   Colorectal cancer screening: Type of screening: Colonoscopy. Completed 07/11/18. Repeat every 10 years  Lung Cancer Screening: (Low Dose CT Chest recommended if Age 41-80 years, 30 pack-year currently smoking OR have quit w/in 15years.) does not qualify.     Additional Screening:  Hepatitis C Screening: does qualify; Completed 06/28/20  Vision Screening: Recommended annual ophthalmology exams for early detection of glaucoma and other disorders of the eye. Is the patient up to date with their annual eye exam?  Yes  Who is the provider or what is the name of the office in which the patient attends annual eye exams? Dr Francene Boyers If pt is not established with a provider, would they like to be referred to a provider to  establish care? No .   Dental Screening: Recommended annual dental exams for proper oral hygiene  Community Resource Referral / Chronic Care Management: CRR required this visit?  No   CCM required this visit?  No      Plan:     I have personally reviewed and noted the following in the patient's chart:   Medical and social history Use of alcohol, tobacco or illicit drugs  Current medications and supplements including opioid prescriptions. Patient is not currently taking opioid prescriptions. Functional ability and status Nutritional status Physical activity Advanced directives List of other physicians Hospitalizations, surgeries, and ER visits in previous 12 months Vitals Screenings to include cognitive, depression, and falls Referrals and appointments  In addition, I have reviewed and discussed with patient certain preventive protocols, quality metrics, and best practice recommendations. A written personalized care plan for preventive services as well as general preventive health recommendations were provided to patient.     Tillie Rung, LPN   [id number]   Nurse Notes: None

## 2023-04-25 NOTE — Progress Notes (Signed)
Remote ICD transmission.   

## 2023-04-29 DIAGNOSIS — R21 Rash and other nonspecific skin eruption: Secondary | ICD-10-CM | POA: Diagnosis not present

## 2023-05-09 ENCOUNTER — Telehealth: Payer: Medicare Other | Admitting: Physician Assistant

## 2023-05-09 DIAGNOSIS — G8929 Other chronic pain: Secondary | ICD-10-CM

## 2023-05-09 NOTE — Progress Notes (Signed)
Because this is an ongoing/chronic issue and due to limitations in pain medications we can prescribe via evisit, I feel your condition warrants further evaluation and I recommend that you be seen in a face to face visit. I would recommend contacting your PCP first to see if they can offer some assistance.    NOTE: There will be NO CHARGE for this eVisit   If you are having a true medical emergency please call 911.      For an urgent face to face visit, [place] has eight urgent care centers for your convenience:   NEW!! [facility] Health Urgent Care Center at [facility] Get Driving Directions [phone] [address] [place], [address]    [facility] Health Urgent Care Center at [facility] Get Driving Directions [phone] [address] [place], [place] [address]   [facility] Health Urgent Care Center [facility]) Get Driving Directions [phone] [address] [place], [place] [address]  [facility] Health Urgent Care Center [facility]) Get Driving Directions [phone] [address] [place],  [place]  [address]  [facility] Health Urgent Care Center [facility] - at [employer]  [phone] [address] [place],  [place] [address]   [facility] Health Urgent Care at [facility] Get Driving Directions [phone] [address] [place], [place] [address]   [facility] Health Urgent Care at [facility] Get Driving Directions  [phone] [address] [place], [place] [address]   [facility] Health Urgent Care at [facility] Directions [phone] [address] [place], [place] [address]  Your MyChart E-visit questionnaire answers were reviewed by a board certified advanced clinical practitioner to complete your personal care plan based on your specific symptoms.  Thank you for using e-Visits.

## 2023-05-10 ENCOUNTER — Telehealth: Payer: Self-pay

## 2023-05-10 NOTE — Progress Notes (Unsigned)
Care Management & Coordination Services Pharmacy Team  Reason for Encounter: follow up skin lesions   Contacted patient for general health update and medication adherence call.  Spoke with patient on 05/11/2023    Have your skin issues resolved? Patient states the sores have closed and have cleared slowly, they are now just a pink area on his skin.   Patient unable to schedule follow up at this time and has requested a follow up call in a month or so.   Chart Updates:  Recent office visits:  04/22/2023 Theresa Mulligan LPN - Patient was seen for Encounter for Medicare annual wellness exam. Discontinued sodium caprylate.   Recent consult visits:  05/09/2023 Marcelline Mates PA-C ([facility]) - Patient was seen for chronic pain. No medication changes.   04/11/2023 Lewayne Bunting MD (cardiology) - Patient was seen for CHB (complete heart block) and additional concerns. No medication changes.   Hospital visits:  Patient was seen at [facility] on 04/10/2023 (3 hours) due to TMJ.    New?Medications Started at [facility] Discharge:?? None Medication Changes at Hospital Discharge: None Medications Discontinued at Hospital Discharge: None Medications that remain the same after Hospital Discharge:??  -All other medications will remain the same.    Medications: Outpatient Encounter Medications as of 05/10/2023  Medication Sig Note   acetaminophen (TYLENOL) 500 MG tablet Take 500 mg by mouth every 6 (six) hours as needed for mild pain. 03/10/2023: Patient takes 375 mg at night   aspirin EC 81 MG tablet Take 81 mg by mouth every morning.    Calcium Carb-Cholecalciferol (OYSTER SHELL CALCIUM W/D) 500-5 MG-MCG TABS Take by mouth daily in the afternoon.    Digestive Enzymes (SUPER ENZYMES PO) Take 1 capsule by mouth daily.    latanoprost (XALATAN) 0.005 % ophthalmic solution Place 1 drop into both eyes at bedtime.    nitroGLYCERIN (NITROSTAT) 0.4 MG SL tablet Place 1 tablet (0.4 mg  total) under the tongue every 5 (five) minutes as needed for chest pain. (Patient not taking: Reported on 03/10/2023) 03/10/2023: Patient has if needed   pantoprazole (PROTONIX) 40 MG tablet Take 1 tablet (40 mg total) by mouth daily.    Probiotic Product (PROBIOTIC DAILY PO) Take 1 capsule by mouth daily in the afternoon.    Selenium 100 MCG TABS Take 100 mcg by mouth daily.    senna (SENOKOT) 8.6 MG tablet Take 1 tablet by mouth as needed for constipation.    SYNTHROID 137 MCG tablet Take 137 mcg by mouth daily before breakfast.    timolol (TIMOPTIC) 0.5 % ophthalmic solution Place 1 drop into both eyes 2 (two) times daily.    traZODone (DESYREL) 50 MG tablet Take 0.5-1 tablets (25-50 mg total) by mouth at bedtime as needed (insomnia).    TURMERIC PO Take 1,500 mg by mouth daily.    venlafaxine XR (EFFEXOR-XR) 37.5 MG 24 hr capsule TAKE 3 CAPSULES BY MOUTH EVERY DAY (Patient taking differently: Take 112.5 mg by mouth daily.) 03/10/2023: Patient take 3 capsules daily    zinc gluconate 50 MG tablet Take 50 mg by mouth daily at 6 PM.    No facility-administered encounter medications on file as of 05/10/2023.  Fill History:  Dispensed Days Supply Quantity Provider Pharmacy  LATANOPROST 0.005% EYE DROPS 04/07/2022 75 7.5 mL      Dispensed Days Supply Quantity Provider Pharmacy  SYNTHROID 137 MCG TABLET 02/22/2023 90 90 each      Dispensed Days Supply Quantity Provider Pharmacy  NITROGLYCERIN  0.4 MG TABLET SL 01/06/2023 5 25 each      Dispensed Days Supply Quantity Provider Pharmacy  PANTOPRAZOLE SOD DR 40 MG TAB 03/22/2023 90 90 each      Dispensed Days Supply Quantity Provider Pharmacy  TIMOLOL MALEATE 0.5% EYE DROPS 02/21/2023 90 30 mL      Dispensed Days Supply Quantity Provider Pharmacy  TRAZODONE 50 MG TABLET 03/22/2023 90 90 each      Dispensed Days Supply Quantity Provider Pharmacy  VENLAFAXINE HCL ER 37.5 MG CAP 04/11/2023 90 270 each     Recent vitals BP Readings from Last 3  Encounters:  04/22/23 120/60  04/11/23 110/70  04/10/23 118/83   Pulse Readings from Last 3 Encounters:  04/22/23 60  04/11/23 65  04/10/23 60   Wt Readings from Last 3 Encounters:  04/22/23 202 lb 8 oz (91.9 kg)  04/11/23 204 lb 3.2 oz (92.6 kg)  04/10/23 197 lb (89.4 kg)   BMI Readings from Last 3 Encounters:  04/22/23 26.72 kg/m  04/11/23 26.94 kg/m  04/10/23 25.99 kg/m    Recent lab results    Component Value Date/Time   NA 135 04/10/2023 1133   NA 139 02/16/2022 1101   K 4.3 04/10/2023 1133   CL 102 04/10/2023 1133   CO2 26 04/10/2023 1133   GLUCOSE 93 04/10/2023 1133   BUN 17 04/10/2023 1133   BUN 17 02/16/2022 1101   CREATININE 1.04 04/10/2023 1133   CALCIUM 9.2 04/10/2023 1133    Lab Results  Component Value Date   CREATININE 1.04 04/10/2023   GFR 77.45 08/13/2022   EGFR 67 02/16/2022   GFRNONAA >60 04/10/2023   GFRAA >60 07/01/2020   Lab Results  Component Value Date/Time   HGBA1C 5.3 12/18/2022 04:24 AM   HGBA1C 5.6 08/13/2022 09:22 AM    Lab Results  Component Value Date   CHOL 170 12/18/2022   HDL 45 12/18/2022   LDLCALC 100 (H) 12/18/2022   TRIG 123 12/18/2022   CHOLHDL 3.8 12/18/2022   Inetta Fermo CMA  Clinical Pharmacist Assistant [phone]

## 2023-05-23 ENCOUNTER — Ambulatory Visit: Payer: Medicare Other | Admitting: Internal Medicine

## 2023-06-01 DIAGNOSIS — E039 Hypothyroidism, unspecified: Secondary | ICD-10-CM | POA: Diagnosis not present

## 2023-06-12 ENCOUNTER — Other Ambulatory Visit: Payer: Self-pay | Admitting: Family

## 2023-06-17 ENCOUNTER — Ambulatory Visit: Payer: Self-pay

## 2023-06-17 NOTE — Patient Instructions (Signed)
Visit Information  Thank you for taking time to visit with me today. Please don't hesitate to contact me if I can be of assistance to you.   Following are the goals we discussed today:   Goals Addressed               This Visit's Progress     CAD and HTN management (pt-stated)        Care Coordination Interventions: Provided education on importance of blood pressure control in management of CAD Provided education on Importance of limiting foods high in cholesterol Reviewed Importance of taking all medications as prescribed Reviewed Importance of attending all scheduled provider appointments Advised to report any changes in symptoms or exercise tolerance  Patient doing well managing health. Continues to exercise regularly.  Diet control for cholesterol at this time.   No concerns.          Our next appointment is by telephone on 08-19-23 at 1030 am  Please call the care guide team at [phone] if you need to cancel or reschedule your appointment.   If you are experiencing a Mental Health or Behavioral Health Crisis or need someone to talk to, please call the Suicide and Crisis Lifeline: 988   Patient verbalizes understanding of instructions and care plan provided today and agrees to view in MyChart. Active MyChart status and patient understanding of how to access instructions and care plan via MyChart confirmed with patient.     The patient has been provided with contact information for the care management team and has been advised to call with any health related questions or concerns.   Bary Leriche, RN, MSN [facility] Care Management Care Management Coordinator Direct Line [phone]

## 2023-06-17 NOTE — Patient Outreach (Signed)
  Care Coordination   Follow Up Visit Note   06/17/2023 Name: Matthew Pratt MRN: [id number] DOB: [birth year]  Matthew Pratt is a 72 y.o. year old male who sees Burchette, Elberta Fortis, MD for primary care. I spoke with  Matthew Pratt by phone today.  What matters to the patients health and wellness today?  Managing health    Goals Addressed               This Visit's Progress     CAD and HTN management (pt-stated)        Care Coordination Interventions: Provided education on importance of blood pressure control in management of CAD Provided education on Importance of limiting foods high in cholesterol Reviewed Importance of taking all medications as prescribed Reviewed Importance of attending all scheduled provider appointments Advised to report any changes in symptoms or exercise tolerance  Patient doing well managing health. Continues to exercise regularly.  Diet control for cholesterol at this time.   No concerns.          SDOH assessments and interventions completed:  Yes     Care Coordination Interventions:  Yes, provided   Follow up plan: Follow up call scheduled for September    Encounter Outcome:  Pt. Visit Completed   Matthew Leriche, RN, MSN [facility] Care Management Care Management Coordinator Direct Line [phone]

## 2023-06-20 ENCOUNTER — Encounter: Payer: Self-pay | Admitting: Family Medicine

## 2023-06-20 MED ORDER — PANTOPRAZOLE SODIUM 40 MG PO TBEC: 40.0000 mg | DELAYED_RELEASE_TABLET | Freq: Every day | ORAL | 1 refills | Status: DC

## 2023-06-22 ENCOUNTER — Ambulatory Visit (INDEPENDENT_AMBULATORY_CARE_PROVIDER_SITE_OTHER): Payer: Medicare Other

## 2023-06-22 DIAGNOSIS — I442 Atrioventricular block, complete: Secondary | ICD-10-CM

## 2023-06-22 LAB — CUP PACEART REMOTE DEVICE CHECK
Battery Remaining Longevity: 81 mo
Battery Remaining Percentage: 90 %
Battery Voltage: 3.13 V
Brady Statistic AP VP Percent: 1 %
Brady Statistic AP VS Percent: 77 %
Brady Statistic AS VP Percent: 1 %
Brady Statistic AS VS Percent: 21 %
Brady Statistic RA Percent Paced: 77 %
Brady Statistic RV Percent Paced: 1 %
Date Time Interrogation Session: 20240717040020
HighPow Impedance: 74 Ohm
HighPow Impedance: 74 Ohm
Implantable Lead Connection Status: 753985
Implantable Lead Connection Status: 753985
Implantable Lead Implant Date: 20240115
Implantable Lead Implant Date: 20240115
Implantable Lead Location: 753859
Implantable Lead Location: 753860
Implantable Lead Model: 7122
Implantable Pulse Generator Implant Date: 20240115
Lead Channel Impedance Value: 430 Ohm
Lead Channel Impedance Value: 490 Ohm
Lead Channel Pacing Threshold Amplitude: 0.5 V
Lead Channel Pacing Threshold Amplitude: 0.5 V
Lead Channel Pacing Threshold Pulse Width: 0.5 ms
Lead Channel Pacing Threshold Pulse Width: 0.5 ms
Lead Channel Sensing Intrinsic Amplitude: 4.8 mV
Lead Channel Sensing Intrinsic Amplitude: 6.2 mV
Lead Channel Setting Pacing Amplitude: 2 V
Lead Channel Setting Pacing Amplitude: 2.5 V
Lead Channel Setting Pacing Pulse Width: 0.5 ms
Lead Channel Setting Sensing Sensitivity: 0.5 mV
Pulse Gen Serial Number: 8956372

## 2023-06-23 DIAGNOSIS — H401131 Primary open-angle glaucoma, bilateral, mild stage: Secondary | ICD-10-CM | POA: Diagnosis not present

## 2023-07-07 NOTE — Progress Notes (Signed)
Remote ICD transmission.   

## 2023-07-13 DIAGNOSIS — H905 Unspecified sensorineural hearing loss: Secondary | ICD-10-CM | POA: Diagnosis not present

## 2023-08-01 ENCOUNTER — Other Ambulatory Visit: Payer: Self-pay | Admitting: Family Medicine

## 2023-08-01 ENCOUNTER — Other Ambulatory Visit: Payer: Self-pay | Admitting: Family

## 2023-08-16 DIAGNOSIS — R739 Hyperglycemia, unspecified: Secondary | ICD-10-CM | POA: Diagnosis not present

## 2023-08-19 ENCOUNTER — Ambulatory Visit: Payer: Self-pay

## 2023-08-19 NOTE — Patient Outreach (Signed)
Care Coordination   Follow Up Visit Note   08/19/2023 Name: Brenner Santoli MRN: [id number] DOB: July 13, 1951  Albina Billet Abdo is a 72 y.o. year old male who sees Burchette, Elberta Fortis, MD for primary care. I spoke with  Albina Billet Madariaga by phone today.  What matters to the patients health and wellness today?  Maintain health    Goals Addressed               This Visit's Progress     CAD and HTN management (pt-stated)        Care Coordination Interventions: Provided education on importance of blood pressure control in management of CAD Provided education on Importance of limiting foods high in cholesterol Reviewed Importance of taking all medications as prescribed Reviewed Importance of attending all scheduled provider appointments Advised to report any changes in symptoms or exercise tolerance  Patient doing well managing health. Continues to exercise regularly.  Diet control for cholesterol at this time.   Recent A1c 5.7.  Discussed limiting carbs and sweets. No concerns.          SDOH assessments and interventions completed:  Yes  SDOH Interventions Today    Flowsheet Row Most Recent Value  SDOH Interventions   Health Literacy Interventions Intervention Not Indicated        Care Coordination Interventions:  Yes, provided   Follow up plan: Follow up call scheduled for November    Encounter Outcome:  Patient Visit Completed   Bary Leriche, RN, MSN [place]  [facility], [facility] Management Community Coordinator Direct Dial: [phone]  Fax: [phone] Website: [email or web address]

## 2023-08-19 NOTE — Patient Instructions (Signed)
Visit Information  Thank you for taking time to visit with me today. Please don't hesitate to contact me if I can be of assistance to you.   Following are the goals we discussed today:   Goals Addressed               This Visit's Progress     CAD and HTN management (pt-stated)        Care Coordination Interventions: Provided education on importance of blood pressure control in management of CAD Provided education on Importance of limiting foods high in cholesterol Reviewed Importance of taking all medications as prescribed Reviewed Importance of attending all scheduled provider appointments Advised to report any changes in symptoms or exercise tolerance  Patient doing well managing health. Continues to exercise regularly.  Diet control for cholesterol at this time.   Recent A1c 5.7.  Discussed limiting carbs and sweets. No concerns.          Our next appointment is by telephone on 10/21/23 at 1030 am  Please call the care guide team at [phone] if you need to cancel or reschedule your appointment.   If you are experiencing a Mental Health or Behavioral Health Crisis or need someone to talk to, please call the Suicide and Crisis Lifeline: 988   Patient verbalizes understanding of instructions and care plan provided today and agrees to view in MyChart. Active MyChart status and patient understanding of how to access instructions and care plan via MyChart confirmed with patient.     The patient has been provided with contact information for the care management team and has been advised to call with any health related questions or concerns.   Bary Leriche, RN, MSN [facility], [facility] Management Community Coordinator Direct Dial: [phone]  Fax: [phone] Website: [email or web address]

## 2023-09-15 ENCOUNTER — Other Ambulatory Visit: Payer: Self-pay | Admitting: Family Medicine

## 2023-09-16 ENCOUNTER — Ambulatory Visit (INDEPENDENT_AMBULATORY_CARE_PROVIDER_SITE_OTHER): Payer: Medicare Other

## 2023-09-16 DIAGNOSIS — I442 Atrioventricular block, complete: Secondary | ICD-10-CM | POA: Diagnosis not present

## 2023-09-16 LAB — CUP PACEART REMOTE DEVICE CHECK
Battery Remaining Longevity: 79 mo
Battery Remaining Percentage: 88 %
Battery Voltage: 3.07 V
Brady Statistic AP VP Percent: 1 %
Brady Statistic AP VS Percent: 82 %
Brady Statistic AS VP Percent: 1 %
Brady Statistic AS VS Percent: 17 %
Brady Statistic RA Percent Paced: 81 %
Brady Statistic RV Percent Paced: 1 %
Date Time Interrogation Session: 20241011032132
HighPow Impedance: 69 Ohm
HighPow Impedance: 69 Ohm
Implantable Lead Connection Status: 753985
Implantable Lead Connection Status: 753985
Implantable Lead Implant Date: 20240115
Implantable Lead Implant Date: 20240115
Implantable Lead Location: 753859
Implantable Lead Location: 753860
Implantable Lead Model: 7122
Implantable Pulse Generator Implant Date: 20240115
Lead Channel Impedance Value: 400 Ohm
Lead Channel Impedance Value: 460 Ohm
Lead Channel Pacing Threshold Amplitude: 0.5 V
Lead Channel Pacing Threshold Amplitude: 0.5 V
Lead Channel Pacing Threshold Pulse Width: 0.5 ms
Lead Channel Pacing Threshold Pulse Width: 0.5 ms
Lead Channel Sensing Intrinsic Amplitude: 3.9 mV
Lead Channel Sensing Intrinsic Amplitude: 6.1 mV
Lead Channel Setting Pacing Amplitude: 2 V
Lead Channel Setting Pacing Amplitude: 2.5 V
Lead Channel Setting Pacing Pulse Width: 0.5 ms
Lead Channel Setting Sensing Sensitivity: 0.5 mV
Pulse Gen Serial Number: 8956372

## 2023-09-26 NOTE — Progress Notes (Signed)
Remote ICD transmission.   

## 2023-10-02 ENCOUNTER — Telehealth: Payer: Medicare Other | Admitting: Family

## 2023-10-02 DIAGNOSIS — J019 Acute sinusitis, unspecified: Secondary | ICD-10-CM | POA: Diagnosis not present

## 2023-10-02 MED ORDER — PREDNISONE 10 MG (21) PO TBPK
ORAL_TABLET | ORAL | 0 refills | Status: DC
Start: 2023-10-02 — End: 2023-10-18

## 2023-10-02 MED ORDER — BENZONATATE 100 MG PO CAPS
100.0000 mg | ORAL_CAPSULE | Freq: Three times a day (TID) | ORAL | 0 refills | Status: DC | PRN
Start: 2023-10-02 — End: 2023-10-18

## 2023-10-02 MED ORDER — AMOXICILLIN-POT CLAVULANATE 875-125 MG PO TABS
1.0000 | ORAL_TABLET | Freq: Two times a day (BID) | ORAL | 0 refills | Status: DC
Start: 2023-10-02 — End: 2023-10-18

## 2023-10-02 NOTE — Progress Notes (Signed)
E-Visit for Sinus Problems  We are sorry that you are not feeling well.  Here is how we plan to help!  Based on what you have shared with me it looks like you have sinusitis.  Sinusitis is inflammation and infection in the sinus cavities of the head.  Based on your presentation I believe you most likely have Acute Bacterial Sinusitis.  This is an infection caused by bacteria and is treated with antibiotics. I have prescribed Augmentin 875mg /125mg  one tablet twice daily with food, for 7 days. I have sent in prednisone dose pack and tessalon for cough.  You may use an oral decongestant such as Mucinex D or if you have glaucoma or high blood pressure use plain Mucinex. Saline nasal spray help and can safely be used as often as needed for congestion.  If you develop worsening sinus pain, fever or notice severe headache and vision changes, or if symptoms are not better after completion of antibiotic, please schedule an appointment with a health care provider.    Sinus infections are not as easily transmitted as other respiratory infection, however we still recommend that you avoid close contact with loved ones, especially the very young and elderly.  Remember to wash your hands thoroughly throughout the day as this is the number one way to prevent the spread of infection!  Home Care: Only take medications as instructed by your medical team. Complete the entire course of an antibiotic. Do not take these medications with alcohol. A steam or ultrasonic humidifier can help congestion.  You can place a towel over your head and breathe in the steam from hot water coming from a faucet. Avoid close contacts especially the very young and the elderly. Cover your mouth when you cough or sneeze. Always remember to wash your hands.  Get Help Right Away If: You develop worsening fever or sinus pain. You develop a severe head ache or visual changes. Your symptoms persist after you have completed your treatment  plan.  Make sure you Understand these instructions. Will watch your condition. Will get help right away if you are not doing well or get worse.  Thank you for choosing an e-visit.  Your e-visit answers were reviewed by a board certified advanced clinical practitioner to complete your personal care plan. Depending upon the condition, your plan could have included both over the counter or prescription medications.  Please review your pharmacy choice. Make sure the pharmacy is open so you can pick up prescription now. If there is a problem, you may contact your provider through [employer] and have the prescription routed to another pharmacy.  Your safety is important to [place]. If you have drug allergies check your prescription carefully.   For the next 24 hours you can use MyChart to ask questions about today's visit, request a non-urgent call back, or ask for a work or school excuse. You will get an email in the next two days asking about your experience. I hope that your e-visit has been valuable and will speed your recovery.  Approximately 5 minutes was spent documenting and reviewing patient's chart.

## 2023-10-18 ENCOUNTER — Encounter: Payer: Self-pay | Admitting: Family Medicine

## 2023-10-18 ENCOUNTER — Ambulatory Visit (INDEPENDENT_AMBULATORY_CARE_PROVIDER_SITE_OTHER): Payer: Medicare Other | Admitting: Family Medicine

## 2023-10-18 VITALS — BP 102/70 | HR 60 | Temp 97.9°F | Ht 73.0 in | Wt 202.8 lb

## 2023-10-18 DIAGNOSIS — U071 COVID-19: Secondary | ICD-10-CM

## 2023-10-18 DIAGNOSIS — M8580 Other specified disorders of bone density and structure, unspecified site: Secondary | ICD-10-CM | POA: Diagnosis not present

## 2023-10-18 DIAGNOSIS — R059 Cough, unspecified: Secondary | ICD-10-CM | POA: Diagnosis not present

## 2023-10-18 DIAGNOSIS — E785 Hyperlipidemia, unspecified: Secondary | ICD-10-CM | POA: Diagnosis not present

## 2023-10-18 LAB — CBC WITH DIFFERENTIAL/PLATELET
Basophils Absolute: 0 10*3/uL (ref 0.0–0.1)
Basophils Relative: 0.6 % (ref 0.0–3.0)
Eosinophils Absolute: 0.3 10*3/uL (ref 0.0–0.7)
Eosinophils Relative: 4.9 % (ref 0.0–5.0)
HCT: 41.1 % (ref 39.0–52.0)
Hemoglobin: 14 g/dL (ref 13.0–17.0)
Lymphocytes Relative: 29.3 % (ref 12.0–46.0)
Lymphs Abs: 1.6 10*3/uL (ref 0.7–4.0)
MCHC: 34.1 g/dL (ref 30.0–36.0)
MCV: 102.3 fL — ABNORMAL HIGH (ref 78.0–100.0)
Monocytes Absolute: 0.5 10*3/uL (ref 0.1–1.0)
Monocytes Relative: 8.7 % (ref 3.0–12.0)
Neutro Abs: 3.1 10*3/uL (ref 1.4–7.7)
Neutrophils Relative %: 56.5 % (ref 43.0–77.0)
Platelets: 206 10*3/uL (ref 150.0–400.0)
RBC: 4.02 Mil/uL — ABNORMAL LOW (ref 4.22–5.81)
RDW: 12.8 % (ref 11.5–15.5)
WBC: 5.4 10*3/uL (ref 4.0–10.5)

## 2023-10-18 LAB — VITAMIN D 25 HYDROXY (VIT D DEFICIENCY, FRACTURES): VITD: 52.99 ng/mL (ref 30.00–100.00)

## 2023-10-18 NOTE — Patient Instructions (Signed)
Set up DEXA scan for [facility].

## 2023-10-18 NOTE — Progress Notes (Signed)
Established Patient Office Visit  Subjective   Patient ID: Matthew Pratt, male    DOB: 12-24-1950  Age: 72 y.o. MRN: [id number]  Chief Complaint  Patient presents with   Labs Only   Follow-up    HPI   Matthew Pratt has hx of CAD, complete heart block, obstructive sleep apnea, GERD and Barrett's esophagus, osteopenia, statin myopathy, recurrent depression, hypothyroidism, and rosacea.  This past year he had aborted ventricular fib arrest and is status post ICD insertion and pacemaker.  Doing well at this time.  No recent chest pains.  No dizziness or syncope.  Followed closely by cardiology.  History of osteopenia by DEXA scan 2021.  Requesting follow-up scan.  He takes some calcium supplementation as well as vitamin D.  Requesting follow-up vitamin D level.  No recent fractures.  No history of prolonged prednisone therapy.  Non-smoker.  He had COVID a few weeks ago.  Still has some lingering cough.  Overall improved.  No dyspnea.  No hemoptysis.  Does have some fatigue which she thinks may be lingering from COVID.  He is requesting follow-up CBC today.  He picked up COVID when he was down in [facility].  He was on a church trip and around a fair number of people.  Past Medical History:  Diagnosis Date   Anxiety    Arthritis    BPH (benign prostatic hypertrophy)    CAD (coronary artery disease)    s/p STEMI 10/15/10 w/ Promus DES placed x1 in the first OM   Colon polyps    Complication of anesthesia    pt. reported 2015 penile inplant when getting some anesthesia had chest pain,they readjusted  the meds. and continued with surgery. Had no further problems di not have to follow up with anyone.   Depression    Depression with anxiety    Dupuytren's contracture of hand    ED (erectile dysfunction)    GERD (gastroesophageal reflux disease)    Glaucoma    HLD (hyperlipidemia)    Hypocontractile bladder    Hypothyroidism    MI (myocardial infarction) (HCC)    denies    OSA on CPAP    Peyronie's disease    Rosacea    Skin cancer    BCC   Past Surgical History:  Procedure Laterality Date   CHOLECYSTECTOMY N/A 02/10/2021   Procedure: LAPAROSCOPIC CHOLECYSTECTOMY;  Surgeon: Abigail Miyamoto, MD;  Location: [facility] OR;  Service: General;  Laterality: N/A;   COLONOSCOPY WITH ESOPHAGOGASTRODUODENOSCOPY (EGD)     multiple   CORONARY STENT PLACEMENT     ICD IMPLANT N/A 12/20/2022   Procedure: ICD IMPLANT;  Surgeon: Marinus Maw, MD;  Location: [facility] INVASIVE CV LAB;  Service: Cardiovascular;  Laterality: N/A;   INNER EAR SURGERY     LAPAROSCOPIC CHOLECYSTECTOMY Bilateral 02/09/2021   Dr.blackmon   LASIK  02/1998   LEFT HEART CATH AND CORONARY ANGIOGRAPHY N/A 12/15/2022   Procedure: LEFT HEART CATH AND CORONARY ANGIOGRAPHY;  Surgeon: Marykay Lex, MD;  Location: [facility] INVASIVE CV LAB;  Service: Cardiovascular;  Laterality: N/A;   PENILE PROSTHESIS IMPLANT     TONSILECTOMY, ADENOIDECTOMY, BILATERAL MYRINGOTOMY AND TUBES     TONSILLECTOMY     VASECTOMY     WISDOM TOOTH EXTRACTION      reports that he has never smoked. He has never used smokeless tobacco. He reports current alcohol use. He reports that he does not use drugs. family history includes Arthritis in his paternal grandfather; Basal  cell carcinoma in his father and sister; COPD in his mother and another family member; Cancer in his father; Colon cancer in his paternal grandfather and paternal grandmother; Heart failure in his maternal grandfather; Liver cancer in his father; Melanoma in his sister; Rheum arthritis in his maternal grandmother; Varicose Veins in his father. Allergies  Allergen Reactions   Bee Venom Hives and Swelling   Crestor [Rosuvastatin] Other (See Comments)    Myalgia.     Iodinated Contrast Media Hives and Itching   Sulfa Antibiotics Rash   Sulfonamide Derivatives Rash    Review of Systems  Constitutional:  Positive for malaise/fatigue. Negative for chills, fever and weight  loss.  Eyes:  Negative for blurred vision.  Respiratory:  Positive for cough. Negative for hemoptysis, shortness of breath and wheezing.   Cardiovascular:  Negative for chest pain.  Neurological:  Negative for dizziness, loss of consciousness, weakness and headaches.      Objective:     BP 102/70 (BP Location: Left Arm, Patient Position: Sitting, Cuff Size: Normal)   Pulse 60   Temp 97.9 F (36.6 C) (Oral)   Ht 6\' 1"  (1.854 m)   Wt 202 lb 12.8 oz (92 kg)   SpO2 98%   BMI 26.76 kg/m  BP Readings from Last 3 Encounters:  10/18/23 102/70  04/22/23 120/60  04/11/23 110/70   Wt Readings from Last 3 Encounters:  10/18/23 202 lb 12.8 oz (92 kg)  04/22/23 202 lb 8 oz (91.9 kg)  04/11/23 204 lb 3.2 oz (92.6 kg)      Physical Exam Vitals reviewed.  Constitutional:      General: He is not in acute distress.    Appearance: He is not ill-appearing.  Cardiovascular:     Rate and Rhythm: Normal rate and regular rhythm.  Pulmonary:     Effort: Pulmonary effort is normal. No respiratory distress.     Breath sounds: Normal breath sounds. No wheezing or rales.  Musculoskeletal:     Cervical back: Neck supple.     Right lower leg: No edema.     Left lower leg: No edema.  Lymphadenopathy:     Cervical: No cervical adenopathy.  Neurological:     Mental Status: He is alert.      No results found for any visits on 10/18/23.  Last CBC Lab Results  Component Value Date   WBC 4.6 04/10/2023   HGB 13.4 04/10/2023   HCT 38.9 (L) 04/10/2023   MCV 100.3 (H) 04/10/2023   MCH 34.5 (H) 04/10/2023   RDW 12.2 04/10/2023   PLT 169 04/10/2023   Last metabolic panel Lab Results  Component Value Date   GLUCOSE 93 04/10/2023   NA 135 04/10/2023   K 4.3 04/10/2023   CL 102 04/10/2023   CO2 26 04/10/2023   BUN 17 04/10/2023   CREATININE 1.04 04/10/2023   GFRNONAA >60 04/10/2023   CALCIUM 9.2 04/10/2023   PHOS 2.3 (L) 12/17/2022   PROT 5.9 (L) 12/17/2022   ALBUMIN 3.3 (L)  12/17/2022   BILITOT 1.4 (H) 12/17/2022   ALKPHOS 59 12/17/2022   AST 76 (H) 12/17/2022   ALT 107 (H) 12/17/2022   ANIONGAP 7 04/10/2023   Last lipids Lab Results  Component Value Date   CHOL 170 12/18/2022   HDL 45 12/18/2022   LDLCALC 100 (H) 12/18/2022   TRIG 123 12/18/2022   CHOLHDL 3.8 12/18/2022   Last thyroid functions Lab Results  Component Value Date   TSH 3.82 02/22/2022  Last vitamin D Lab Results  Component Value Date   VD25OH 33.61 02/19/2021   Last vitamin B12 and Folate Lab Results  Component Value Date   VITAMINB12 501 02/19/2021    The ASCVD Risk score (Arnett DK, et al., 2019) failed to calculate for the following reasons:   The patient has a prior MI or stroke diagnosis    Assessment & Plan:   #1 osteopenia by bone scan 2021.  Patient denies any recent fall or fracture.  He is requesting follow-up DEXA scan today.  This will be ordered.  Discussed appropriate amounts of daily calcium and vitamin D.  Continue regular weightbearing exercise.  DEXA [facility] ordered [facility] -Check 25-hydroxy vitamin D level  #2 recent COVID infection.  Symptomatically improved.  Denies any persistent fever.  Unremarkable lung exam.  Suspect his fatigue will continue to improve.  Patient requesting follow-up CBC this will be drawn today  #3 history of CAD.  He had myopathy with statins previously.  Apparently PCSK9 inhibitors not tolerated either.    No follow-ups on file.    Evelena Peat, MD

## 2023-10-19 ENCOUNTER — Ambulatory Visit (INDEPENDENT_AMBULATORY_CARE_PROVIDER_SITE_OTHER)
Admission: RE | Admit: 2023-10-19 | Discharge: 2023-10-19 | Disposition: A | Discharge status: Home / Self Care | Payer: Medicare Other | Source: Ambulatory Visit

## 2023-10-19 DIAGNOSIS — M8580 Other specified disorders of bone density and structure, unspecified site: Secondary | ICD-10-CM

## 2023-10-21 ENCOUNTER — Ambulatory Visit: Payer: Self-pay

## 2023-10-21 NOTE — Patient Outreach (Signed)
  Care Coordination   Follow Up Visit Note   10/21/2023 Name: Matthew Pratt MRN: [id number] DOB: Jun 13, 1951  Matthew Billet Hackleman is a 72 y.o. year old male who sees Burchette, Elberta Fortis, MD for primary care. I spoke with  Matthew Pratt by phone today.  What matters to the patients health and wellness today?  Bone density    Goals Addressed               This Visit's Progress     CAD and HTN management (pt-stated)        Care Coordination Interventions: Provided education on importance of blood pressure control in management of CAD Provided education on Importance of limiting foods high in cholesterol Reviewed Importance of taking all medications as prescribed Reviewed Importance of attending all scheduled provider appointments Advised to report any changes in symptoms or exercise tolerance  Patient doing good managing health. However, he recently had COVID but feels that is much better now.  Discussed Bone Density and exercise.  Discussed dietary diary intake to increase calcium intake. Diet control continues for cholesterol at this time.            SDOH assessments and interventions completed:  Yes  SDOH Interventions Today    Flowsheet Row Most Recent Value  SDOH Interventions   Utilities Interventions Intervention Not Indicated        Care Coordination Interventions:  Yes, provided   Follow up plan: Follow up call scheduled for January    Encounter Outcome:  Patient Visit Completed   Bary Leriche, RN, MSN [place]  [facility], [facility] Management Community Coordinator Direct Dial: [phone]  Fax: [phone] Website: [email or web address]

## 2023-10-21 NOTE — Patient Instructions (Signed)
Visit Information  Thank you for taking time to visit with me today. Please don't hesitate to contact me if I can be of assistance to you.   Following are the goals we discussed today:   Goals Addressed               This Visit's Progress     CAD and HTN management (pt-stated)        Care Coordination Interventions: Provided education on importance of blood pressure control in management of CAD Provided education on Importance of limiting foods high in cholesterol Reviewed Importance of taking all medications as prescribed Reviewed Importance of attending all scheduled provider appointments Advised to report any changes in symptoms or exercise tolerance  Patient doing good managing health. However, he recently had COVID but feels that is much better now.  Discussed Bone Density and exercise.  Discussed dietary diary intake to increase calcium intake. Diet control continues for cholesterol at this time.            Our next appointment is by telephone on 12/22/22 at 1030 am  Please call the care guide team at [phone] if you need to cancel or reschedule your appointment.   If you are experiencing a Mental Health or Behavioral Health Crisis or need someone to talk to, please call the Suicide and Crisis Lifeline: 988   Patient verbalizes understanding of instructions and care plan provided today and agrees to view in MyChart. Active MyChart status and patient understanding of how to access instructions and care plan via MyChart confirmed with patient.     The patient has been provided with contact information for the care management team and has been advised to call with any health related questions or concerns.   Bary Leriche, RN, MSN [facility], [facility] Management Community Coordinator Direct Dial: [phone]  Fax: [phone] Website: [email or web address]

## 2023-10-26 ENCOUNTER — Other Ambulatory Visit: Payer: Self-pay | Admitting: Family Medicine

## 2023-11-04 ENCOUNTER — Encounter: Payer: Self-pay | Admitting: Internal Medicine

## 2023-11-11 ENCOUNTER — Telehealth: Payer: Medicare Other | Admitting: Physician Assistant

## 2023-11-11 DIAGNOSIS — B9689 Other specified bacterial agents as the cause of diseases classified elsewhere: Secondary | ICD-10-CM | POA: Diagnosis not present

## 2023-11-11 DIAGNOSIS — J208 Acute bronchitis due to other specified organisms: Secondary | ICD-10-CM | POA: Diagnosis not present

## 2023-11-11 MED ORDER — BENZONATATE 100 MG PO CAPS
100.0000 mg | ORAL_CAPSULE | Freq: Three times a day (TID) | ORAL | 0 refills | Status: DC | PRN
Start: 2023-11-11 — End: 2023-12-26

## 2023-11-11 MED ORDER — DOXYCYCLINE HYCLATE 100 MG PO TABS
100.0000 mg | ORAL_TABLET | Freq: Two times a day (BID) | ORAL | 0 refills | Status: DC
Start: 2023-11-11 — End: 2023-12-26

## 2023-11-11 NOTE — Progress Notes (Signed)

## 2023-11-17 ENCOUNTER — Ambulatory Visit: Payer: Medicare Other | Attending: Nurse Practitioner | Admitting: Nurse Practitioner

## 2023-11-17 ENCOUNTER — Encounter: Payer: Self-pay | Admitting: Nurse Practitioner

## 2023-11-17 VITALS — BP 134/82 | HR 60 | Ht 73.0 in | Wt 207.2 lb

## 2023-11-17 DIAGNOSIS — I2510 Atherosclerotic heart disease of native coronary artery without angina pectoris: Secondary | ICD-10-CM | POA: Diagnosis not present

## 2023-11-17 DIAGNOSIS — E785 Hyperlipidemia, unspecified: Secondary | ICD-10-CM

## 2023-11-17 DIAGNOSIS — I442 Atrioventricular block, complete: Secondary | ICD-10-CM

## 2023-11-17 DIAGNOSIS — R0602 Shortness of breath: Secondary | ICD-10-CM | POA: Diagnosis not present

## 2023-11-17 DIAGNOSIS — G4733 Obstructive sleep apnea (adult) (pediatric): Secondary | ICD-10-CM | POA: Diagnosis not present

## 2023-11-17 DIAGNOSIS — I469 Cardiac arrest, cause unspecified: Secondary | ICD-10-CM | POA: Diagnosis not present

## 2023-11-17 NOTE — Patient Instructions (Addendum)
.  Medication Instructions:  Your physician recommends that you continue on your current medications as directed. Please refer to the Current Medication list given to you today.  *If you need a refill on your cardiac medications before your next appointment, please call your pharmacy*   Lab Work: NONE ordered at this time of appointment   Testing/Procedures: Your physician has requested that you have an echocardiogram. Echocardiography is a painless test that uses sound waves to create images of your heart. It provides your doctor with information about the size and shape of your heart and how well your heart's chambers and valves are working. This procedure takes approximately one hour. There are no restrictions for this procedure. Please do NOT wear cologne, perfume, aftershave, or lotions (deodorant is allowed). Please arrive 15 minutes prior to your appointment time.  Please note: We ask at that you not bring children with you during ultrasound (echo/ vascular) testing. Due to room size and safety concerns, children are not allowed in the ultrasound rooms during exams. Our front office staff cannot provide observation of children in our lobby area while testing is being conducted. An adult accompanying a patient to their appointment will only be allowed in the ultrasound room at the discretion of the ultrasound technician under special circumstances. We apologize for any inconvenience.     Follow-Up: At [facility], you and your health needs are our priority.  As part of our continuing mission to provide you with exceptional heart care, we have created designated Provider Care Teams.  These Care Teams include your primary Cardiologist (physician) and Advanced Practice Providers (APPs -  Physician Assistants and Nurse Practitioners) who all work together to provide you with the care you need, when you need it.  We recommend signing up for the patient portal called "MyChart".  Sign up  information is provided on this After Visit Summary.  MyChart is used to connect with patients for Virtual Visits (Telemedicine).  Patients are able to view lab/test results, encounter notes, upcoming appointments, etc.  Non-urgent messages can be sent to your provider as well.   To learn more about what you can do with MyChart, go to [email or web address].    Your next appointment:   6 month(s)  Provider:   Bernadene Person, NP        Other Instructions

## 2023-11-17 NOTE — Progress Notes (Signed)
Office Visit    Patient Name: Matthew Pratt Date of Encounter: 11/17/2023  Primary Care Provider:  Kristian Covey, MD Primary Cardiologist:  Parke Poisson, MD  Chief Complaint    72 year old male with a history of cardiac arrest s/p DDD ICD in 12/2022, CAD s/p STEMI in 2011, DES-OM1, hyperlipidemia, OSA, and BPH who presents for follow-up related to CAD.   Past Medical History    Past Medical History:  Diagnosis Date   Anxiety    Arthritis    BPH (benign prostatic hypertrophy)    CAD (coronary artery disease)    s/p STEMI 10/15/10 w/ Promus DES placed x1 in the first OM   Colon polyps    Complication of anesthesia    pt. reported 2015 penile inplant when getting some anesthesia had chest pain,they readjusted  the meds. and continued with surgery. Had no further problems di not have to follow up with anyone.   Depression    Depression with anxiety    Dupuytren's contracture of hand    ED (erectile dysfunction)    GERD (gastroesophageal reflux disease)    Glaucoma    HLD (hyperlipidemia)    Hypocontractile bladder    Hypothyroidism    MI (myocardial infarction) (HCC)    denies   OSA on CPAP    Peyronie's disease    Rosacea    Skin cancer    BCC   Past Surgical History:  Procedure Laterality Date   CHOLECYSTECTOMY N/A 02/10/2021   Procedure: LAPAROSCOPIC CHOLECYSTECTOMY;  Surgeon: Abigail Miyamoto, MD;  Location: [facility] OR;  Service: General;  Laterality: N/A;   COLONOSCOPY WITH ESOPHAGOGASTRODUODENOSCOPY (EGD)     multiple   CORONARY STENT PLACEMENT     ICD IMPLANT N/A 12/20/2022   Procedure: ICD IMPLANT;  Surgeon: Marinus Maw, MD;  Location: [facility] INVASIVE CV LAB;  Service: Cardiovascular;  Laterality: N/A;   INNER EAR SURGERY     LAPAROSCOPIC CHOLECYSTECTOMY Bilateral 02/09/2021   Dr.blackmon   LASIK  02/1998   LEFT HEART CATH AND CORONARY ANGIOGRAPHY N/A 12/15/2022   Procedure: LEFT HEART CATH AND CORONARY ANGIOGRAPHY;  Surgeon: Marykay Lex, MD;   Location: [facility] INVASIVE CV LAB;  Service: Cardiovascular;  Laterality: N/A;   PENILE PROSTHESIS IMPLANT     TONSILECTOMY, ADENOIDECTOMY, BILATERAL MYRINGOTOMY AND TUBES     TONSILLECTOMY     VASECTOMY     WISDOM TOOTH EXTRACTION      Allergies  Allergies  Allergen Reactions   Bee Venom Hives and Swelling   Crestor [Rosuvastatin] Other (See Comments)    Myalgia.     Iodinated Contrast Media Hives and Itching   Sulfa Antibiotics Rash   Sulfonamide Derivatives Rash     Labs/Other Studies Reviewed    The following studies were reviewed today: Cardiac Studies & Procedures   CARDIAC CATHETERIZATION  CARDIAC CATHETERIZATION 12/15/2022  Narrative   Prox LAD to Mid LAD lesion is 55% stenosed.  Dist LAD lesion is 45% stenosed.   Prox Cx to Mid Cx lesion is 55% stenosed.   1st Mrg (previously placed Drug Eluting stent is 100% stenosed -> CTO with left to left and right left lateral   Prox RCA lesion is 60% stenosed. Mid RCA lesion is 55% stenosed.   LV end diastolic pressure is low.   There is no aortic valve stenosis.  POST-CATH DIAGNOSES Diffuse moderate to severe disease with calcification in all the proximal vessels, and likely CTO of previous OM1 stent filling via collaterals  from both left to right and right to left. No obvious culprit lesion to explain cardiac arrest. Low LVEDP of 6 mmHg. A-line pressures were in the high 80s to low 90s compared to cuff . Hypokalemia noted on labs-potassium 2.9.  Was started on IV potassium 10 mg once   RECOMMENDATION Monitor clinically for signs of recovery per PCCM. I suspect regional wall motion abnormality on echo was related to the CTO of the OM.    Bryan Lemma, MD  Findings Coronary Findings Diagnostic  Dominance: Right  Left Anterior Descending There is mild diffuse disease throughout the vessel. Prox LAD to Mid LAD lesion is 55% stenosed. Dist LAD lesion is 45% stenosed.  First Diagonal Branch Vessel is moderate in  size.  First Septal Branch Vessel is small in size.  Second Diagonal Branch Vessel is small in size.  Ramus Intermedius Vessel is large. There is mild diffuse disease throughout the vessel.  Left Circumflex Prox Cx to Mid Cx lesion is 55% stenosed.  First Obtuse Marginal Branch Collaterals 1st Mrg filled by collaterals from 1st Diag.  1st Mrg lesion is 100% stenosed. The lesion is chronically occluded with right-to-left and left-to-left collateral flow. The lesion was previously treated using a drug eluting stent over 2 years ago.  Lateral First Obtuse Marginal Branch Collaterals Lat 1st Mrg filled by collaterals from RPDA.  Collaterals Lat 1st Mrg filled by collaterals from 1st RPL.  First Left Posterolateral Branch Vessel is small in size.  Second Left Posterolateral Branch Collaterals 2nd LPL filled by collaterals from 1st Mrg.  Left Posterior Atrioventricular Artery Vessel is large in size.  Right Coronary Artery Vessel was injected. Vessel is large. There is moderate diffuse disease throughout the vessel. The vessel is moderately calcified. Prox RCA lesion is 60% stenosed. The lesion is focal and discrete. Mid RCA lesion is 55% stenosed. The lesion is focal and concentric.  Acute Marginal Branch Vessel is small in size.  Right Ventricular Branch Vessel is small in size.  Intervention  No interventions have been documented.    ECHOCARDIOGRAM  ECHOCARDIOGRAM COMPLETE 12/15/2022  Narrative ECHOCARDIOGRAM REPORT    Patient Name:   Matthew Pratt Date of Exam: 12/15/2022 Medical Rec #:  [id number]          Height:       73.0 in Accession #:    [id number]         Weight:       190.0 lb Date of Birth:  09-03-1951           BSA:          2.105 m Patient Age:    71 years           BP:           81/58 mmHg Patient Gender: M                  HR:           79 bpm. Exam Location:  Inpatient  Procedure: 2D Echo, Cardiac Doppler and Color Doppler  STAT  ECHO  Indications:    Congestive Heart Failure I50.9  History:        Patient has prior history of Echocardiogram examinations, most recent 01/20/2021. Previous Myocardial Infarction and CAD; Risk Factors:Dyslipidemia and GERD.  Sonographer:    Eulah Pont RDCS Referring Phys: [id number] Gloris Manchester  IMPRESSIONS   1. Left ventricular ejection fraction, by estimation, is 55%. The left ventricle has low normal function.  The left ventricle demonstrates regional wall motion abnormalities (see scoring diagram/findings for description). Left ventricular diastolic parameters were normal. 2. Right ventricular systolic function is mildly reduced. The right ventricular size is mildly enlarged. Tricuspid regurgitation signal is inadequate for assessing PA pressure. 3. Right atrial size was mildly dilated. 4. The mitral valve is grossly normal. Mild mitral valve regurgitation. No evidence of mitral stenosis. 5. The aortic valve is grossly normal. There is mild thickening of the aortic valve. Aortic valve regurgitation is not visualized. No aortic stenosis is present. 6. The inferior vena cava is normal in size with greater than 50% respiratory variability, suggesting right atrial pressure of 3 mmHg.  FINDINGS Left Ventricle: Left ventricular ejection fraction, by estimation, is 55%. The left ventricle has low normal function. The left ventricle demonstrates regional wall motion abnormalities. The left ventricular internal cavity size was normal in size. There is no left ventricular hypertrophy. Left ventricular diastolic parameters were normal.   LV Wall Scoring: The posterior wall is hypokinetic.  Right Ventricle: The right ventricular size is mildly enlarged. No increase in right ventricular wall thickness. Right ventricular systolic function is mildly reduced. Tricuspid regurgitation signal is inadequate for assessing PA pressure. The tricuspid regurgitant velocity is 1.75 m/s, and with an  assumed right atrial pressure of 3 mmHg, the estimated right ventricular systolic pressure is 15.2 mmHg.  Left Atrium: Left atrial size was normal in size.  Right Atrium: Right atrial size was mildly dilated.  Pericardium: There is no evidence of pericardial effusion.  Mitral Valve: The mitral valve is grossly normal. Mild mitral valve regurgitation. No evidence of mitral valve stenosis.  Tricuspid Valve: The tricuspid valve is normal in structure. Tricuspid valve regurgitation is trivial. No evidence of tricuspid stenosis.  Aortic Valve: The aortic valve is grossly normal. There is mild thickening of the aortic valve. Aortic valve regurgitation is not visualized. No aortic stenosis is present.  Pulmonic Valve: The pulmonic valve was grossly normal. Pulmonic valve regurgitation is trivial. No evidence of pulmonic stenosis.  Aorta: The aortic root is normal in size and structure. Ascending aorta measurements are within normal limits for age when indexed to body surface area.  Venous: The inferior vena cava is normal in size with greater than 50% respiratory variability, suggesting right atrial pressure of 3 mmHg.  IAS/Shunts: No atrial level shunt detected by color flow Doppler.   LEFT VENTRICLE PLAX 2D LVIDd:         5.30 cm      Diastology LVIDs:         3.70 cm      LV e' medial:    9.03 cm/s LV PW:         0.80 cm      LV E/e' medial:  5.3 LV IVS:        0.90 cm      LV e' lateral:   12.10 cm/s LVOT diam:     2.10 cm      LV E/e' lateral: 4.0 LV SV:         54 LV SV Index:   26 LVOT Area:     3.46 cm  LV Volumes (MOD) LV vol d, MOD A2C: 124.0 ml LV vol d, MOD A4C: 134.0 ml LV vol s, MOD A2C: 49.2 ml LV vol s, MOD A4C: 50.0 ml LV SV MOD A2C:     74.8 ml LV SV MOD A4C:     134.0 ml LV SV MOD BP:  78.8 ml  RIGHT VENTRICLE RV S prime:     12.10 cm/s TAPSE (M-mode): 2.5 cm  LEFT ATRIUM           Index        RIGHT ATRIUM           Index LA diam:      4.00 cm 1.90  cm/m   RA Area:     24.90 cm LA Vol (A2C): 38.1 ml 18.10 ml/m  RA Volume:   73.50 ml  34.91 ml/m LA Vol (A4C): 46.1 ml 21.90 ml/m AORTIC VALVE LVOT Vmax:   79.00 cm/s LVOT Vmean:  52.000 cm/s LVOT VTI:    0.155 m  AORTA Ao Root diam: 3.40 cm Ao Asc diam:  3.70 cm  MITRAL VALVE               TRICUSPID VALVE MV Area (PHT): 3.42 cm    TR Peak grad:   12.2 mmHg MV Decel Time: 222 msec    TR Vmax:        175.00 cm/s MR Peak grad: 28.5 mmHg MR Mean grad: 23.0 mmHg    SHUNTS MR Vmax:      267.00 cm/s  Systemic VTI:  0.16 m MR Vmean:     235.0 cm/s   Systemic Diam: 2.10 cm MV E velocity: 47.90 cm/s MV A velocity: 49.90 cm/s MV E/A ratio:  0.96  Weston Brass MD Electronically signed by Weston Brass MD Signature Date/Time: 12/15/2022/2:37:17 PM    Final            Recent Labs: 12/17/2022: ALT 107; Magnesium 1.9 04/10/2023: BUN 17; Creatinine, Ser 1.04; Potassium 4.3; Sodium 135 10/18/2023: Hemoglobin 14.0; Platelets 206.0  Recent Lipid Panel    Component Value Date/Time   CHOL 170 12/18/2022 0424   CHOL 163 03/25/2021 0954   TRIG 123 12/18/2022 0424   HDL 45 12/18/2022 0424   HDL 59 03/25/2021 0954   CHOLHDL 3.8 12/18/2022 0424   VLDL 25 12/18/2022 0424   LDLCALC 100 (H) 12/18/2022 0424   LDLCALC 86 03/25/2021 0954    History of Present Illness    72 year old male with the above past medical history including cardiac arrest s/p DDD ICD in 12/2022, CAD s/p STEMI in 2011, DES-OM1, hyperlipidemia, OSA, and BPH.   He was hospitalized in January 2024 following a witnessed collapse.  CPR was initiated immediately and he underwent 8 minutes of CPR with 3 defibrillatory shocks, by the time EMS arrived, ROSC was achieved.  He was intubated in the field. Cardiac catheterization which revealed a CTO of the OM with collaterals, otherwise nonobstructive CAD.  Echocardiogram showed preserved LVEF. He developed bradycardia with back-to-back VT pausing/standstill events with  complete heart block.  He underwent DDD ICD implantation on 12/20/2022. He was last seen in the office on 04/11/2023 by EP and was stable from a cardiac standpoint.  Most recent remote device check in 09/2023 showed normal device function, 1 episode of NSVT.   He presents today for follow-up.  Since his last visit he has been stable overall from a cardiac standpoint.  He was diagnosed with COVID in October 2024. More recently he was treated for a upper respiratory infection.  He has had shortness of breath with exertion, chest heaviness when lying down.  He is concerned that his symptoms could be coming from his heart.  He denies exertional chest pain.  Prior to his COVID infection and upper respiratory symptoms, he denied any chest pain or  shortness of breath.Other than his recent symptoms of chest discomfort, shortness of breath, he denies any additional concerns today.  Home Medications    Current Outpatient Medications  Medication Sig Dispense Refill   acetaminophen (TYLENOL) 500 MG tablet Take 500 mg by mouth every 6 (six) hours as needed for mild pain.     aspirin EC 81 MG tablet Take 81 mg by mouth every morning.     Calcium Carb-Cholecalciferol (OYSTER SHELL CALCIUM W/D) 500-5 MG-MCG TABS Take by mouth daily in the afternoon.     Digestive Enzymes (SUPER ENZYMES PO) Take 1 capsule by mouth daily.     doxycycline (VIBRA-TABS) 100 MG tablet Take 1 tablet (100 mg total) by mouth 2 (two) times daily. 14 tablet 0   latanoprost (XALATAN) 0.005 % ophthalmic solution Place 1 drop into both eyes at bedtime.     pantoprazole (PROTONIX) 40 MG tablet Take 1 tablet (40 mg total) by mouth daily. 90 tablet 1   Probiotic Product (PROBIOTIC DAILY PO) Take 1 capsule by mouth daily in the afternoon.     Selenium 100 MCG TABS Take 100 mcg by mouth daily.     senna (SENOKOT) 8.6 MG tablet Take 1 tablet by mouth as needed for constipation.     SYNTHROID 137 MCG tablet Take 137 mcg by mouth daily before breakfast.      timolol (TIMOPTIC) 0.5 % ophthalmic solution Place 1 drop into both eyes 2 (two) times daily.     traZODone (DESYREL) 50 MG tablet Take 0.5-1 tablets (25-50 mg total) by mouth at bedtime as needed (insomnia). 90 tablet 1   TURMERIC PO Take 1,500 mg by mouth daily.     venlafaxine XR (EFFEXOR-XR) 37.5 MG 24 hr capsule TAKE 3 CAPSULES BY MOUTH EVERY DAY 270 capsule 0   zinc gluconate 50 MG tablet Take 50 mg by mouth daily at 6 PM.     benzonatate (TESSALON) 100 MG capsule Take 1-2 capsules (100-200 mg total) by mouth 3 (three) times daily as needed. (Patient not taking: Reported on 11/17/2023) 30 capsule 0   nitroGLYCERIN (NITROSTAT) 0.4 MG SL tablet Place 1 tablet (0.4 mg total) under the tongue every 5 (five) minutes as needed for chest pain. (Patient not taking: Reported on 03/10/2023) 25 tablet 3   No current facility-administered medications for this visit.     Review of Systems    He denies palpitations, pnd, orthopnea, n, v, dizziness, syncope, edema, weight gain, or early satiety. All other systems reviewed and are otherwise negative except as noted above.   Physical Exam    VS:  BP 134/82   Pulse 60   Ht 6\' 1"  (1.854 m)   Wt 207 lb 3.2 oz (94 kg)   SpO2 96%   BMI 27.34 kg/m   GEN: Well nourished, well developed, in no acute distress. HEENT: normal. Neck: Supple, no JVD, carotid bruits, or masses. Cardiac: RRR, no murmurs, rubs, or gallops. No clubbing, cyanosis, edema.  Radials/DP/PT 2+ and equal bilaterally.  Respiratory:  Respirations regular and unlabored, clear to auscultation bilaterally. GI: Soft, nontender, nondistended, BS + x 4. MS: no deformity or atrophy. Skin: warm and dry, no rash. Neuro:  Strength and sensation are intact. Psych: Normal affect.  Accessory Clinical Findings    ECG personally reviewed by me today - EKG Interpretation Date/Time:  Thursday November 17 2023 14:51:36 EST Ventricular Rate:  60 PR Interval:  182 QRS Duration:  74 QT  Interval:  416 QTC Calculation: 416 R  Axis:   -2  Text Interpretation: Atrial-paced rhythm Low voltage QRS Nonspecific ST abnormality When compared with ECG of 10-Apr-2023 11:02, PREVIOUS ECG IS PRESENT Confirmed by Bernadene Person ([id number]) on 11/17/2023 3:12:45 PM  - no acute changes.   Lab Results  Component Value Date   WBC 5.4 10/18/2023   HGB 14.0 10/18/2023   HCT 41.1 10/18/2023   MCV 102.3 (H) 10/18/2023   PLT 206.0 10/18/2023   Lab Results  Component Value Date   CREATININE 1.04 04/10/2023   BUN 17 04/10/2023   NA 135 04/10/2023   K 4.3 04/10/2023   CL 102 04/10/2023   CO2 26 04/10/2023   Lab Results  Component Value Date   ALT 107 (H) 12/17/2022   AST 76 (H) 12/17/2022   ALKPHOS 59 12/17/2022   BILITOT 1.4 (H) 12/17/2022   Lab Results  Component Value Date   CHOL 170 12/18/2022   HDL 45 12/18/2022   LDLCALC 100 (H) 12/18/2022   TRIG 123 12/18/2022   CHOLHDL 3.8 12/18/2022    Lab Results  Component Value Date   HGBA1C 5.3 12/18/2022    Assessment & Plan    1. Cardiac arrest: Cath revealed CTO of OM with collaterals, otherwise nonobstructive CAD.  Echocardiogram showed preserved LVEF.  S/p DDD ICD.  No significant arrhythmias detected on device per EP.   2. Intermittent AV block: S/p DDD ICD.  A-paced on EKG today.  Most recent remote device check in 09/2023 showed normal device function, 1 episode of NSVT. Following with EP.    3. CAD/chest pain/dyspnea on exertion: S/p NSTEMI, DES-OM1 in 2011. Cath in 12/2022 in the setting of cardiac arrest revealed CTO of OM with collaterals, otherwise nonobstructive CAD.  He had COVID in October 2024, recently treated for an upper respiratory infection.  Since this time he has had shortness of breath with exertion, chest heaviness when lying down.  He denies exertional chest pain.  Prior to his respiratory illness, he denied any symptoms of chest pain or shortness of breath. Euvolemic and well compensated on exam. Suspect  symptoms likely in the setting of recent respiratory illness.  Will check echo. I do not think further ischemic evaluation is warranted at this time.  If echo unremarkable, would advise he continue monitor symptoms, increase activity as tolerated.  If symptoms persist, could consider ischemic evaluation vs escalation of medical therapy. Continue aspirin, metoprolol.   4. Hyperlipidemia: LDL was 100 and 12/2022.  He is statin intolerant.  He did not tolerate Repatha, Praluent or Leqvio. Declines any alternative lipid-lowering therapy at this time and understands that there is an increased risk of progressive CAD in this setting.   5. OSA: He is no longer using his CPAP.  6. Disposition: Follow-up in 6 months per patient request, sooner if needed.        Joylene Grapes, NP 11/17/2023, 3:32 PM

## 2023-11-18 ENCOUNTER — Encounter: Payer: Self-pay | Admitting: Internal Medicine

## 2023-12-01 DIAGNOSIS — R7303 Prediabetes: Secondary | ICD-10-CM | POA: Diagnosis not present

## 2023-12-01 DIAGNOSIS — E039 Hypothyroidism, unspecified: Secondary | ICD-10-CM | POA: Diagnosis not present

## 2023-12-09 ENCOUNTER — Ambulatory Visit: Payer: Medicare Other | Admitting: Nurse Practitioner

## 2023-12-10 ENCOUNTER — Other Ambulatory Visit: Payer: Self-pay | Admitting: Family Medicine

## 2023-12-11 ENCOUNTER — Encounter: Payer: Self-pay | Admitting: Family Medicine

## 2023-12-11 ENCOUNTER — Other Ambulatory Visit: Payer: Self-pay | Admitting: Family Medicine

## 2023-12-12 MED ORDER — PANTOPRAZOLE SODIUM 40 MG PO TBEC: 40.0000 mg | DELAYED_RELEASE_TABLET | Freq: Every day | ORAL | 1 refills | Status: DC

## 2023-12-16 ENCOUNTER — Encounter: Payer: Self-pay | Admitting: Family Medicine

## 2023-12-19 MED ORDER — TRAZODONE HCL 50 MG PO TABS: 25.0000 mg | ORAL_TABLET | Freq: Every evening | ORAL | 1 refills | Status: DC | PRN

## 2023-12-20 ENCOUNTER — Encounter: Payer: Self-pay | Admitting: Family Medicine

## 2023-12-21 ENCOUNTER — Ambulatory Visit (INDEPENDENT_AMBULATORY_CARE_PROVIDER_SITE_OTHER): Payer: Medicare Other

## 2023-12-21 ENCOUNTER — Other Ambulatory Visit: Payer: Medicare Other

## 2023-12-21 ENCOUNTER — Ambulatory Visit (HOSPITAL_COMMUNITY)
Admission: RE | Admit: 2023-12-21 | Discharge: 2023-12-21 | Disposition: A | Discharge status: Home / Self Care | Payer: Medicare Other | Source: Ambulatory Visit | Attending: Nurse Practitioner | Admitting: Nurse Practitioner

## 2023-12-21 DIAGNOSIS — R0602 Shortness of breath: Secondary | ICD-10-CM

## 2023-12-21 DIAGNOSIS — I442 Atrioventricular block, complete: Secondary | ICD-10-CM

## 2023-12-21 LAB — CUP PACEART REMOTE DEVICE CHECK
Battery Remaining Longevity: 78 mo
Battery Remaining Percentage: 86 %
Battery Voltage: 3.02 V
Brady Statistic AP VP Percent: 1 %
Brady Statistic AP VS Percent: 81 %
Brady Statistic AS VP Percent: 1 %
Brady Statistic AS VS Percent: 18 %
Brady Statistic RA Percent Paced: 80 %
Brady Statistic RV Percent Paced: 1 %
Date Time Interrogation Session: 20250115040022
HighPow Impedance: 80 Ohm
HighPow Impedance: 80 Ohm
Implantable Lead Connection Status: 753985
Implantable Lead Connection Status: 753985
Implantable Lead Implant Date: 20240115
Implantable Lead Implant Date: 20240115
Implantable Lead Location: 753859
Implantable Lead Location: 753860
Implantable Lead Model: 7122
Implantable Pulse Generator Implant Date: 20240115
Lead Channel Impedance Value: 450 Ohm
Lead Channel Impedance Value: 530 Ohm
Lead Channel Pacing Threshold Amplitude: 0.5 V
Lead Channel Pacing Threshold Amplitude: 0.5 V
Lead Channel Pacing Threshold Pulse Width: 0.5 ms
Lead Channel Pacing Threshold Pulse Width: 0.5 ms
Lead Channel Sensing Intrinsic Amplitude: 11.8 mV
Lead Channel Sensing Intrinsic Amplitude: 5 mV
Lead Channel Setting Pacing Amplitude: 2 V
Lead Channel Setting Pacing Amplitude: 2.5 V
Lead Channel Setting Pacing Pulse Width: 0.5 ms
Lead Channel Setting Sensing Sensitivity: 0.5 mV
Pulse Gen Serial Number: 8956372

## 2023-12-21 LAB — ECHOCARDIOGRAM COMPLETE
AR max vel: 2.78 cm2
AV Area VTI: 2.73 cm2
AV Area mean vel: 2.81 cm2
AV Mean grad: 2 mm[Hg]
AV Peak grad: 2.9 mm[Hg]
AV Vena cont: 0.31 cm
Ao pk vel: 0.85 m/s
Area-P 1/2: 2.07 cm2
MV M vel: 2.24 m/s
MV Peak grad: 20.1 mm[Hg]
P 1/2 time: 686 ms
S' Lateral: 3.59 cm

## 2023-12-23 ENCOUNTER — Ambulatory Visit: Payer: Self-pay

## 2023-12-23 NOTE — Patient Outreach (Signed)
  Care Coordination   Follow Up Visit Note   12/23/2023 Name: Matthew Pratt MRN: [id number] DOB: 11/08/1951  Matthew Pratt is a 73 y.o. year old male who sees Burchette, Elberta Fortis, MD for primary care. I spoke with  Matthew Pratt by phone today.  What matters to the patients health and wellness today?  Maintain health    Goals Addressed               This Visit's Progress     CAD and HTN management (pt-stated)        Care Coordination Interventions: Provided education on importance of blood pressure control in management of CAD Provided education on Importance of limiting foods high in cholesterol Reviewed Importance of taking all medications as prescribed Reviewed Importance of attending all scheduled provider appointments Advised to report any changes in symptoms or exercise tolerance  Patient doing okay. Trying a new diet to lose weight.  Advised if diet low carbohydrate that it may make him not feel good.  He has appointment with pharmacy to discuss vitamin supplements.  He has some reoccurring skin issues for which he will discuss with D. Burchette and possibly get dermatology referral.   Diet control continues for cholesterol at this time.            SDOH assessments and interventions completed:  Yes     Care Coordination Interventions:  Yes, provided   Follow up plan: Follow up call scheduled for March    Encounter Outcome:  Patient Visit Completed   Matthew Leriche, RN, MSN Matthew Pratt  [facility], [facility] RN Care Manager Direct Dial: [phone]  Fax: [phone] Website: [email or web address]

## 2023-12-23 NOTE — Patient Instructions (Signed)
Visit Information  Thank you for taking time to visit with me today. Please don't hesitate to contact me if I can be of assistance to you.   Following are the goals we discussed today:   Goals Addressed               This Visit's Progress     CAD and HTN management (pt-stated)        Care Coordination Interventions: Provided education on importance of blood pressure control in management of CAD Provided education on Importance of limiting foods high in cholesterol Reviewed Importance of taking all medications as prescribed Reviewed Importance of attending all scheduled provider appointments Advised to report any changes in symptoms or exercise tolerance  Patient doing okay. Trying a new diet to lose weight.  Advised if diet low carbohydrate that it may make him not feel good.  He has appointment with pharmacy to discuss vitamin supplements.  He has some reoccurring skin issues for which he will discuss with D. Burchette and possibly get dermatology referral.   Diet control continues for cholesterol at this time.            Our next appointment is by telephone on 02-17-24 at 1030 am  Please call the care guide team at [phone] if you need to cancel or reschedule your appointment.   If you are experiencing a Mental Health or Behavioral Health Crisis or need someone to talk to, please call the Suicide and Crisis Lifeline: 988   Patient verbalizes understanding of instructions and care plan provided today and agrees to view in MyChart. Active MyChart status and patient understanding of how to access instructions and care plan via MyChart confirmed with patient.     The patient has been provided with contact information for the care management team and has been advised to call with any health related questions or concerns.   Bary Leriche, RN, MSN [facility], [facility] RN Care Manager Direct Dial: [phone]  Fax:  [phone] Website: [email or web address]

## 2023-12-26 ENCOUNTER — Ambulatory Visit: Payer: Medicare Other

## 2023-12-26 ENCOUNTER — Other Ambulatory Visit: Payer: Self-pay

## 2023-12-26 DIAGNOSIS — Z79899 Other long term (current) drug therapy: Secondary | ICD-10-CM

## 2023-12-26 NOTE — Telephone Encounter (Signed)
-----   Message from Sherrill Raring sent at 12/26/2023 10:17 AM EST ----- Regarding: Med Refill Hello,  Patient is requesting refill on the following medication for use as needed:  Epipen 0.3mg    Preferred Pharmacy: [id number] - [place], [place] - [address] AT [facility] [address] P: [phone] F: [phone]   Thank you, Sherrill Raring, PharmD Clinical Pharmacist [phone]

## 2023-12-26 NOTE — Telephone Encounter (Signed)
Ok to refill 

## 2023-12-26 NOTE — Progress Notes (Signed)
   12/26/2023  Patient ID: Matthew Pratt, male   DOB: [birth year], 73 y.o.   MRN: [id number]  Met with patient in office to review all of his current medications and supplements.  Updated patient's medication list so it is complete and accurate.  Patient expresses understanding on how to take medications and will contact my direct line if any questions arise.  Sherrill Raring, PharmD Clinical Pharmacist [phone]

## 2023-12-28 ENCOUNTER — Encounter: Payer: Self-pay | Admitting: Family Medicine

## 2023-12-28 ENCOUNTER — Ambulatory Visit (INDEPENDENT_AMBULATORY_CARE_PROVIDER_SITE_OTHER): Payer: Medicare Other | Admitting: Family Medicine

## 2023-12-28 VITALS — BP 118/80 | HR 62 | Temp 98.0°F | Wt 208.2 lb

## 2023-12-28 DIAGNOSIS — M79605 Pain in left leg: Secondary | ICD-10-CM

## 2023-12-28 DIAGNOSIS — F339 Major depressive disorder, recurrent, unspecified: Secondary | ICD-10-CM | POA: Diagnosis not present

## 2023-12-28 DIAGNOSIS — M79604 Pain in right leg: Secondary | ICD-10-CM

## 2023-12-28 MED ORDER — EPINEPHRINE 0.3 MG/0.3ML IJ SOAJ: 0.3000 mg | INTRAMUSCULAR | 2 refills | Status: AC | PRN

## 2023-12-28 MED ORDER — HYDROCODONE-ACETAMINOPHEN 5-325 MG PO TABS: 1.0000 | ORAL_TABLET | Freq: Four times a day (QID) | ORAL | 0 refills | Status: DC | PRN

## 2023-12-28 NOTE — Telephone Encounter (Signed)
Rx sent 

## 2023-12-28 NOTE — Patient Instructions (Signed)
Increase the Effexor to 4 capsules once daily  Set up one month follow up.

## 2023-12-28 NOTE — Progress Notes (Signed)
Established Patient Office Visit  Subjective   Patient ID: Matthew Pratt, male    DOB: 22-Jun-1951  Age: 73 y.o. MRN: [id number]  No chief complaint on file.   HPI   Matthew Pratt is seen for medical follow-up.  Has history of CAD, history of complete heart block, obstructive sleep apnea, GERD with Barrett's esophagus, hypothyroidism, recurrent depression, glaucoma.  Here to discuss the following items:  Longstanding history of depression.  Currently on Effexor 37.5 mg 3 daily.  He sees a Veterinary surgeon regularly.  He had some recent increased depression symptoms.  No suicidal ideation.  Has taken higher doses of Effexor in the past and feels like he needs to go back to 150 mg.  He would like to consider titration.  Second issue is bilateral leg pain especially at night for the past couple months.  Has had intolerance with multiple statins previously and currently not taking any statin medication.  Denies any significant leg cramps.  He describes achy discomfort at night throughout most of his upper thighs and legs with minimal response with acetaminophen.  He has taken low-dose hydrocodone infrequently in the past for severe pain.  Recent TSH and vitamin D level were normal.  He had multiple skin problems which he attributes partly to loss of collagen vascular support.  Recently has had some issues with wrinkling of the chin region and apparently had seen some specialists and was considering some type of injection therapy for this.  He is not sure exactly what the medication was.  He has had problems with Dupuytren's contracture, Peyronie's disease, and trigger fingers in the past.  Past Medical History:  Diagnosis Date   Anxiety    Arthritis    BPH (benign prostatic hypertrophy)    CAD (coronary artery disease)    s/p STEMI 10/15/10 w/ Promus DES placed x1 in the first OM   Colon polyps    Complication of anesthesia    pt. reported 2015 penile inplant when getting some anesthesia had chest  pain,they readjusted  the meds. and continued with surgery. Had no further problems di not have to follow up with anyone.   Depression    Depression with anxiety    Dupuytren's contracture of hand    ED (erectile dysfunction)    GERD (gastroesophageal reflux disease)    Glaucoma    HLD (hyperlipidemia)    Hypocontractile bladder    Hypothyroidism    MI (myocardial infarction) (HCC)    denies   OSA on CPAP    Peyronie's disease    Rosacea    Skin cancer    BCC   Past Surgical History:  Procedure Laterality Date   CHOLECYSTECTOMY N/A 02/10/2021   Procedure: LAPAROSCOPIC CHOLECYSTECTOMY;  Surgeon: Abigail Miyamoto, MD;  Location: [facility] OR;  Service: General;  Laterality: N/A;   COLONOSCOPY WITH ESOPHAGOGASTRODUODENOSCOPY (EGD)     multiple   CORONARY STENT PLACEMENT     ICD IMPLANT N/A 12/20/2022   Procedure: ICD IMPLANT;  Surgeon: Marinus Maw, MD;  Location: [facility] INVASIVE CV LAB;  Service: Cardiovascular;  Laterality: N/A;   INNER EAR SURGERY     LAPAROSCOPIC CHOLECYSTECTOMY Bilateral 02/09/2021   Dr.blackmon   LASIK  02/1998   LEFT HEART CATH AND CORONARY ANGIOGRAPHY N/A 12/15/2022   Procedure: LEFT HEART CATH AND CORONARY ANGIOGRAPHY;  Surgeon: Marykay Lex, MD;  Location: [facility] INVASIVE CV LAB;  Service: Cardiovascular;  Laterality: N/A;   PENILE PROSTHESIS IMPLANT     TONSILECTOMY, ADENOIDECTOMY, BILATERAL MYRINGOTOMY  AND TUBES     TONSILLECTOMY     VASECTOMY     WISDOM TOOTH EXTRACTION      reports that he has never smoked. He has never used smokeless tobacco. He reports current alcohol use. He reports that he does not use drugs. family history includes Arthritis in his paternal grandfather; Basal cell carcinoma in his father and sister; COPD in his mother and another family member; Cancer in his father; Colon cancer in his paternal grandfather and paternal grandmother; Heart failure in his maternal grandfather; Liver cancer in his father; Melanoma in his sister; Rheum  arthritis in his maternal grandmother; Varicose Veins in his father. Allergies  Allergen Reactions   Bee Venom Hives and Swelling   Crestor [Rosuvastatin] Other (See Comments)    Myalgia.     Iodinated Contrast Media Hives and Itching   Sulfa Antibiotics Rash   Sulfonamide Derivatives Rash    Review of Systems  Constitutional:  Negative for malaise/fatigue.  Eyes:  Negative for blurred vision.  Respiratory:  Negative for shortness of breath.   Cardiovascular:  Negative for chest pain.  Neurological:  Negative for dizziness, weakness and headaches.  Psychiatric/Behavioral:  Positive for depression. Negative for suicidal ideas.       Objective:     BP 118/80 (BP Location: Left Arm, Patient Position: Sitting, Cuff Size: Normal)   Pulse 62   Temp 98 F (36.7 C) (Oral)   Wt 208 lb 3.2 oz (94.4 kg)   SpO2 98%   BMI 27.47 kg/m  BP Readings from Last 3 Encounters:  12/28/23 118/80  11/17/23 134/82  10/18/23 102/70   Wt Readings from Last 3 Encounters:  12/28/23 208 lb 3.2 oz (94.4 kg)  11/17/23 207 lb 3.2 oz (94 kg)  10/18/23 202 lb 12.8 oz (92 kg)      Physical Exam Vitals reviewed.  Constitutional:      General: He is not in acute distress.    Appearance: He is well-developed. He is not ill-appearing.  HENT:     Right Ear: External ear normal.     Left Ear: External ear normal.  Eyes:     Pupils: Pupils are equal, round, and reactive to light.  Neck:     Thyroid: No thyromegaly.  Cardiovascular:     Rate and Rhythm: Normal rate and regular rhythm.  Pulmonary:     Effort: Pulmonary effort is normal. No respiratory distress.     Breath sounds: Normal breath sounds. No wheezing or rales.  Musculoskeletal:     Cervical back: Neck supple.     Right lower leg: No edema.     Left lower leg: No edema.  Neurological:     Mental Status: He is alert and oriented to person, place, and time.  Psychiatric:        Mood and Affect: Mood normal.        Thought Content:  Thought content normal.      No results found for any visits on 12/28/23.  Last CBC Lab Results  Component Value Date   WBC 5.4 10/18/2023   HGB 14.0 10/18/2023   HCT 41.1 10/18/2023   MCV 102.3 (H) 10/18/2023   MCH 34.5 (H) 04/10/2023   RDW 12.8 10/18/2023   PLT 206.0 10/18/2023   Last metabolic panel Lab Results  Component Value Date   GLUCOSE 93 04/10/2023   NA 135 04/10/2023   K 4.3 04/10/2023   CL 102 04/10/2023   CO2 26 04/10/2023   BUN  17 04/10/2023   CREATININE 1.04 04/10/2023   GFRNONAA >60 04/10/2023   CALCIUM 9.2 04/10/2023   PHOS 2.3 (L) 12/17/2022   PROT 5.9 (L) 12/17/2022   ALBUMIN 3.3 (L) 12/17/2022   BILITOT 1.4 (H) 12/17/2022   ALKPHOS 59 12/17/2022   AST 76 (H) 12/17/2022   ALT 107 (H) 12/17/2022   ANIONGAP 7 04/10/2023   Last lipids Lab Results  Component Value Date   CHOL 170 12/18/2022   HDL 45 12/18/2022   LDLCALC 100 (H) 12/18/2022   TRIG 123 12/18/2022   CHOLHDL 3.8 12/18/2022   Last hemoglobin A1c Lab Results  Component Value Date   HGBA1C 5.3 12/18/2022   Last thyroid functions Lab Results  Component Value Date   TSH 3.82 02/22/2022   Last vitamin D Lab Results  Component Value Date   VD25OH 52.99 10/18/2023      The ASCVD Risk score (Arnett DK, et al., 2019) failed to calculate for the following reasons:   Risk score cannot be calculated because patient has a medical history suggesting prior/existing ASCVD    Assessment & Plan:   #1 history of recurrent depression.  Currently on Effexor XR 37.5 mg 3 capsules daily.  He is requesting further titration up to 4 capsules or 150 mg daily.  He has done well with this dose in the past.  We updated his med list to reflect this.  He does not need refills at this point.  He is strongly encouraged to continue with regular counseling.  Set up 1 month follow-up to reassess.  He has no suicidal ideation at this time  #2 bilateral leg pain especially at night.  No statin use.   Recent TSH and vitamin D levels normal.  He has used acetaminophen with limited relief.  We agreed to limited hydrocodone 5 mg 1 nightly as needed for severe pain.  #20 with no refills     Return in about 1 month (around 01/28/2024).    Evelena Peat, MD

## 2023-12-29 NOTE — Patient Outreach (Signed)
Visit note routed to provider for [facility]

## 2024-01-18 ENCOUNTER — Telehealth: Payer: Self-pay

## 2024-01-18 NOTE — Telephone Encounter (Signed)
Copied from CRM [id number]. Topic: General - Other >> Jan 17, 2024  5:05 PM Corin V wrote: Reason for CRM: Matthew Pratt with [employer] called and stated: Patient may have Atherosclerotic Cardiovascular Disease (ASCVD) and may be on a statin but not high to moderate intensity statin per current ADA and ACC/AHA cholesterol guideline recommendations. Please consider reevaluating therapy for patient and switch to high or moderate intensity statin if Dr. Caryl Never feels it is appropriate. Please send new prescription to the pharm if possible.  If patient does not tolerate a statin or meets exclusion criteria for statin use, document ICD 10 details for insurance coverage Callback is: [phone]

## 2024-01-27 ENCOUNTER — Ambulatory Visit: Payer: Medicare Other | Admitting: Family Medicine

## 2024-01-27 ENCOUNTER — Encounter: Payer: Self-pay | Admitting: Family Medicine

## 2024-01-27 VITALS — BP 110/70 | HR 89 | Temp 98.5°F | Wt 209.8 lb

## 2024-01-27 DIAGNOSIS — I442 Atrioventricular block, complete: Secondary | ICD-10-CM

## 2024-01-27 DIAGNOSIS — J069 Acute upper respiratory infection, unspecified: Secondary | ICD-10-CM

## 2024-01-27 DIAGNOSIS — K219 Gastro-esophageal reflux disease without esophagitis: Secondary | ICD-10-CM | POA: Diagnosis not present

## 2024-01-27 DIAGNOSIS — F339 Major depressive disorder, recurrent, unspecified: Secondary | ICD-10-CM | POA: Diagnosis not present

## 2024-01-27 MED ORDER — VENLAFAXINE HCL ER 150 MG PO CP24: 150.0000 mg | ORAL_CAPSULE | Freq: Every day | ORAL | 3 refills | Status: DC

## 2024-01-27 NOTE — Progress Notes (Signed)
Established Patient Office Visit  Subjective   Patient ID: Matthew Pratt, male    DOB: 10/18/1951  Age: 73 y.o. MRN: [id number]  Chief Complaint  Patient presents with   Medical Management of Chronic Issues    HPI   Matthew Pratt is here for follow-up regarding depression.  He has longstanding history of recurrent depression.  We recently had bumped his Effexor XR up to 150 mg daily.  He feels like this has helped some.  He is staying engaged with things like church.  Will need to renew refill of new dose of Effexor XR and would like to go to 150 mg tablet.  Had been taking four 37.5 mg tablets.    He is dealing with the remnants from recent URI.  About a week ago developed sore throat, nasal congestion, fatigue.  No cough.  No fever.  COVID testing negative.  Does feel like he is gradually improving.  Has history of CAD.  Last heart cath January 2024.  Prior history of intolerance to statins.  Also had out of hospital witnessed cardiac arrest secondary to complete heart block.  Now has ICD .  Doing well.  No recent dizziness or chest pain.  He has hypothyroidism which is managed by endocrinology.  Past history of GERD controlled with PPI  Past Medical History:  Diagnosis Date   Anxiety    Arthritis    BPH (benign prostatic hypertrophy)    CAD (coronary artery disease)    s/p STEMI 10/15/10 w/ Promus DES placed x1 in the first OM   Colon polyps    Complication of anesthesia    pt. reported 2015 penile inplant when getting some anesthesia had chest pain,they readjusted  the meds. and continued with surgery. Had no further problems di not have to follow up with anyone.   Depression    Depression with anxiety    Dupuytren's contracture of hand    ED (erectile dysfunction)    GERD (gastroesophageal reflux disease)    Glaucoma    HLD (hyperlipidemia)    Hypocontractile bladder    Hypothyroidism    MI (myocardial infarction) (HCC)    denies   OSA on CPAP    Peyronie's disease     Rosacea    Skin cancer    BCC   Past Surgical History:  Procedure Laterality Date   CHOLECYSTECTOMY N/A 02/10/2021   Procedure: LAPAROSCOPIC CHOLECYSTECTOMY;  Surgeon: Abigail Miyamoto, MD;  Location: [facility] OR;  Service: General;  Laterality: N/A;   COLONOSCOPY WITH ESOPHAGOGASTRODUODENOSCOPY (EGD)     multiple   CORONARY STENT PLACEMENT     ICD IMPLANT N/A 12/20/2022   Procedure: ICD IMPLANT;  Surgeon: Marinus Maw, MD;  Location: [facility] INVASIVE CV LAB;  Service: Cardiovascular;  Laterality: N/A;   INNER EAR SURGERY     LAPAROSCOPIC CHOLECYSTECTOMY Bilateral 02/09/2021   Dr.blackmon   LASIK  02/1998   LEFT HEART CATH AND CORONARY ANGIOGRAPHY N/A 12/15/2022   Procedure: LEFT HEART CATH AND CORONARY ANGIOGRAPHY;  Surgeon: Marykay Lex, MD;  [facility] INVASIVE CV LAB;  Service: Cardiovascular;  Laterality: N/A;   PENILE PROSTHESIS IMPLANT     TONSILECTOMY, ADENOIDECTOMY, BILATERAL MYRINGOTOMY AND TUBES     TONSILLECTOMY     VASECTOMY     WISDOM TOOTH EXTRACTION      reports that he has never smoked. He has never used smokeless tobacco. He reports current alcohol use. He reports that he does not use drugs. family history includes Arthritis  in his paternal grandfather; Basal cell carcinoma in his father and sister; COPD in his mother and another family member; Cancer in his father; Colon cancer in his paternal grandfather and paternal grandmother; Heart failure in his maternal grandfather; Liver cancer in his father; Melanoma in his sister; Rheum arthritis in his maternal grandmother; Varicose Veins in his father. Allergies  Allergen Reactions   Bee Venom Hives and Swelling   Crestor [Rosuvastatin] Other (See Comments)    Myalgia.     Iodinated Contrast Media Hives and Itching   Sulfa Antibiotics Rash   Sulfonamide Derivatives Rash      Review of Systems  Constitutional:  Negative for chills and fever.  HENT:  Positive for congestion and sore throat.   Eyes:  Negative for  blurred vision.  Respiratory:  Positive for cough. Negative for shortness of breath.   Cardiovascular:  Negative for chest pain.  Neurological:  Negative for dizziness, weakness and headaches.      Objective:     BP 110/70 (BP Location: Left Arm, Patient Position: Sitting, Cuff Size: Normal)   Pulse 89   Temp 98.5 F (36.9 C) (Oral)   Wt 209 lb 12.8 oz (95.2 kg)   SpO2 96%   BMI 27.68 kg/m  BP Readings from Last 3 Encounters:  01/27/24 110/70  12/28/23 118/80  11/17/23 134/82   Wt Readings from Last 3 Encounters:  01/27/24 209 lb 12.8 oz (95.2 kg)  12/28/23 208 lb 3.2 oz (94.4 kg)  11/17/23 207 lb 3.2 oz (94 kg)      Physical Exam Vitals reviewed.  Constitutional:      General: He is not in acute distress.    Appearance: He is not ill-appearing.  Cardiovascular:     Rate and Rhythm: Normal rate and regular rhythm.  Pulmonary:     Effort: Pulmonary effort is normal.     Breath sounds: Normal breath sounds. No wheezing or rales.  Musculoskeletal:     Right lower leg: No edema.     Left lower leg: No edema.  Neurological:     General: No focal deficit present.     Mental Status: He is alert.      No results found for any visits on 01/27/24.  Last CBC Lab Results  Component Value Date   WBC 5.4 10/18/2023   HGB 14.0 10/18/2023   HCT 41.1 10/18/2023   MCV 102.3 (H) 10/18/2023   MCH 34.5 (H) 04/10/2023   RDW 12.8 10/18/2023   PLT 206.0 10/18/2023   Last metabolic panel Lab Results  Component Value Date   GLUCOSE 93 04/10/2023   NA 135 04/10/2023   K 4.3 04/10/2023   CL 102 04/10/2023   CO2 26 04/10/2023   BUN 17 04/10/2023   CREATININE 1.04 04/10/2023   GFRNONAA >60 04/10/2023   CALCIUM 9.2 04/10/2023   PHOS 2.3 (L) 12/17/2022   PROT 5.9 (L) 12/17/2022   ALBUMIN 3.3 (L) 12/17/2022   BILITOT 1.4 (H) 12/17/2022   ALKPHOS 59 12/17/2022   AST 76 (H) 12/17/2022   ALT 107 (H) 12/17/2022   ANIONGAP 7 04/10/2023   Last lipids Lab Results   Component Value Date   CHOL 170 12/18/2022   HDL 45 12/18/2022   LDLCALC 100 (H) 12/18/2022   TRIG 123 12/18/2022   CHOLHDL 3.8 12/18/2022   Last hemoglobin A1c Lab Results  Component Value Date   HGBA1C 5.3 12/18/2022   Last thyroid functions Lab Results  Component Value Date   TSH  3.82 02/22/2022      The ASCVD Risk score (Arnett DK, et al., 2019) failed to calculate for the following reasons:   Risk score cannot be calculated because patient has a medical history suggesting prior/existing ASCVD    Assessment & Plan:   #1 longstanding history of recurrent depression.  He is had extensive counseling in the past.  We recently bumped his Effexor up to 150 mg.  Refill Effexor XR 150 mg once daily #90 with 3 refills.  Be in touch for any worsening symptoms  #2 viral URI with no cough.  Symptoms seem to be improving.  Follow-up promptly for fever or increased shortness of breath  #3 history of complete heart block.  Patient has ICD in place and followed regularly by cardiology.  #4 GERD controlled on Protonix 40 mg daily.  He does have history of Barrett's esophagus.  Evelena Peat, MD

## 2024-01-31 NOTE — Progress Notes (Signed)
 Remote ICD transmission.

## 2024-02-06 NOTE — Telephone Encounter (Signed)
 Patient was seen in office 01/27/24

## 2024-02-16 ENCOUNTER — Ambulatory Visit: Payer: Self-pay

## 2024-02-16 NOTE — Patient Instructions (Signed)
 Visit Information  Thank you for taking time to visit with me today. Please don't hesitate to contact me if I can be of assistance to you.   Following are the goals we discussed today:   Goals Addressed               This Visit's Progress     CAD and HTN management (pt-stated)        Care Coordination Interventions: Provided education on importance of blood pressure control in management of CAD Provided education on Importance of limiting foods high in cholesterol Reviewed Importance of taking all medications as prescribed Reviewed Importance of attending all scheduled provider appointments Advised to report any changes in symptoms or exercise tolerance  Patient doing okay. He is having some joint/muscle pain.  He wonders if it from COVID and other viral illness.He has been taking hydrocodone. Discussed TENS unit use to help with pain.  He continues to lose weight. His weight at home 197 lbs.  He continues to count calories and exercise when he can.  He has family visits coming soon.          Our next appointment is by telephone on 04/19/24 at 1000 am  Please call the care guide team at [phone] if you need to cancel or reschedule your appointment.   If you are experiencing a Mental Health or Behavioral Health Crisis or need someone to talk to, please call the Suicide and Crisis Lifeline: 988   Patient verbalizes understanding of instructions and care plan provided today and agrees to view in MyChart. Active MyChart status and patient understanding of how to access instructions and care plan via MyChart confirmed with patient.     The patient has been provided with contact information for the care management team and has been advised to call with any health related questions or concerns.   Bary Leriche RN, MSN [facility], [facility] RN Care Manager Direct Dial: [phone]  Fax: [phone] Website: [email or web address]

## 2024-02-16 NOTE — Patient Outreach (Signed)
 Care Coordination   Follow Up Visit Note   02/16/2024 Name: Rasul Decola MRN: [id number] DOB: August 27, 1951  Albina Billet Cubero is a 73 y.o. year old male who sees Burchette, Elberta Fortis, MD for primary care. I spoke with  Albina Billet Gebbia by phone today.  What matters to the patients health and wellness today?  Recent joint and muscle pain    Goals Addressed               This Visit's Progress     CAD and HTN management (pt-stated)        Care Coordination Interventions: Provided education on importance of blood pressure control in management of CAD Provided education on Importance of limiting foods high in cholesterol Reviewed Importance of taking all medications as prescribed Reviewed Importance of attending all scheduled provider appointments Advised to report any changes in symptoms or exercise tolerance  Patient doing okay. He is having some joint/muscle pain.  He wonders if it from COVID and other viral illness.He has been taking hydrocodone. Discussed TENS unit use to help with pain.  He continues to lose weight. His weight at home 197 lbs.  He continues to count calories and exercise when he can.  He has family visits coming soon.          SDOH assessments and interventions completed:  Yes  SDOH Interventions Today    Flowsheet Row Most Recent Value  SDOH Interventions   Utilities Interventions Intervention Not Indicated        Care Coordination Interventions:  Yes, provided   Follow up plan: Follow up call scheduled for May    Encounter Outcome:  Patient Visit Completed   Bary Leriche RN, MSN [facility], [facility] RN Care Manager Direct Dial: [phone]  Fax: [phone] Website: [email or web address]

## 2024-03-21 ENCOUNTER — Ambulatory Visit (INDEPENDENT_AMBULATORY_CARE_PROVIDER_SITE_OTHER): Payer: Medicare Other

## 2024-03-21 DIAGNOSIS — I442 Atrioventricular block, complete: Secondary | ICD-10-CM | POA: Diagnosis not present

## 2024-03-22 ENCOUNTER — Encounter: Payer: Self-pay | Admitting: Internal Medicine

## 2024-03-22 LAB — CUP PACEART REMOTE DEVICE CHECK
Battery Remaining Longevity: 75 mo
Battery Remaining Percentage: 83 %
Battery Voltage: 2.99 V
Brady Statistic AP VP Percent: 1 %
Brady Statistic AP VS Percent: 80 %
Brady Statistic AS VP Percent: 1 %
Brady Statistic AS VS Percent: 19 %
Brady Statistic RA Percent Paced: 80 %
Brady Statistic RV Percent Paced: 1 %
Date Time Interrogation Session: 20250416182337
HighPow Impedance: 80 Ohm
HighPow Impedance: 80 Ohm
Implantable Lead Connection Status: 753985
Implantable Lead Connection Status: 753985
Implantable Lead Implant Date: 20240115
Implantable Lead Implant Date: 20240115
Implantable Lead Location: 753859
Implantable Lead Location: 753860
Implantable Lead Model: 7122
Implantable Pulse Generator Implant Date: 20240115
Lead Channel Impedance Value: 460 Ohm
Lead Channel Impedance Value: 490 Ohm
Lead Channel Pacing Threshold Amplitude: 0.5 V
Lead Channel Pacing Threshold Amplitude: 0.5 V
Lead Channel Pacing Threshold Pulse Width: 0.5 ms
Lead Channel Pacing Threshold Pulse Width: 0.5 ms
Lead Channel Sensing Intrinsic Amplitude: 5 mV
Lead Channel Sensing Intrinsic Amplitude: 6.6 mV
Lead Channel Setting Pacing Amplitude: 2 V
Lead Channel Setting Pacing Amplitude: 2.5 V
Lead Channel Setting Pacing Pulse Width: 0.5 ms
Lead Channel Setting Sensing Sensitivity: 0.5 mV
Pulse Gen Serial Number: 8956372

## 2024-03-23 ENCOUNTER — Encounter: Payer: Self-pay | Admitting: Internal Medicine

## 2024-04-16 ENCOUNTER — Encounter (HOSPITAL_COMMUNITY): Payer: Self-pay

## 2024-04-19 ENCOUNTER — Encounter: Payer: Self-pay | Admitting: Family Medicine

## 2024-04-19 ENCOUNTER — Other Ambulatory Visit: Payer: Self-pay

## 2024-04-19 NOTE — Patient Instructions (Signed)
 Visit Information  Thank you for taking time to visit with me today. Please don't hesitate to contact me if I can be of assistance to you before our next scheduled telephone appointment.  Our next appointment is by telephone on 07/19/24 at 930am  Following is a copy of your care plan:   Goals Addressed             This Visit's Progress    VBCI RN Care Plan       Problems:  Chronic Disease Management support and education needs related to Depression  Goal: Over the next 6 months the Patient will continue to work with RN Care Manager and/or Social Worker to address care management and care coordination needs related to Depression as evidenced by adherence to care management team scheduled appointments      Interventions:   Evaluation of current treatment plan related to Depression, Limited social support and patient's preference of a maximum of quarterly follow ups by [facility] with understanding that he will call RNCM if needed, patient confident inself-management and patient's adherence to plan as established by provider. Discussed plans with patient for ongoing care management follow up and provided patient with direct contact information for care management team Evaluation of current treatment plan related to recurrent depression and patient's adherence to plan as established by provider Reviewed medications with patient and discussed PCP's decision on 01/27/24 to increase patient's Extended Release Effexor  to 150mg  daily. Patient demonstrated he is knowledgeable about this antidepressant and how it works. Has follow up with his PCP on 04/23/24. Discussed plans with patient for ongoing care management follow up and provided patient with direct contact information for care management team Assessed social determinant of health barriers  Patient Self-Care Activities:  Attend all scheduled provider appointments Attend church or other social activities Call pharmacy for medication refills 3-7  days in advance of running out of medications Call provider office for new concerns or questions  Perform IADL's (shopping, preparing meals, housekeeping, managing finances) independently Take medications as prescribed   call the Suicide and Crisis Lifeline: 988 call 1-800-273-TALK (toll free, 24 hour hotline) go to [facility] [address] [place] [phone]) if experiencing a Mental Health or Behavioral Health Crisis  Plan:  The patient has been provided with contact information for the care management team and has been advised to call with any health related questions or concerns.           VBCI RN Care Plan       Problems:  Chronic Disease Management support and education needs related to Irritable Bowel Syndrome with diarrhea  Goal: Over the next 6 months the Patient will demonstrate a decrease in Irritable Bowel Syndrome symptoms in exacerbations as evidenced by decreased episodes of diarrhea - this is IMPORTANT in terms of patient's use of Effexor  Extended Release 150 mg 24 hr capsules for his recurrent depression because multiple episodes of diarrhea impacts the absorption of extended release capsules and because of this, patient's Effexor  XR may not be absorbed as intended/needed.  Interventions:   Health Maintenance Interventions: Provided education about lifestyle changes that can made to better manage Irritable Bowel Syndrome with diarrhea such as Low FODMAP diets, adding Fiber, Probiotics, and Regular Exercise that includes a minimum of 30 minutes a day  Patient Self-Care Activities:  Attend all scheduled provider appointments Attend church or other social activities Call pharmacy for medication refills 3-7 days in advance of running out of medications Call provider  office for new concerns or questions  Perform all self care activities independently  Perform IADL's (shopping, preparing meals, housekeeping, managing finances)  independently Take medications as prescribed   Add 30 minutes of exercise to daily routine  Plan:  The patient has been provided with contact information for the care management team and has been advised to call with any health related questions or concerns.              Patient verbalizes understanding of instructions and care plan provided today and agrees to view in MyChart. Active MyChart status and patient understanding of how to access instructions and care plan via MyChart confirmed with patient.     The patient has been provided with contact information for the care management team and has been advised to call with any health related questions or concerns.   Please call the care guide team at [phone] if you need to cancel or reschedule your appointment.   Please call 1-800-273-TALK (toll free, 24 hour hotline) if you are experiencing a Mental Health or Behavioral Health Crisis or need someone to talk to.  Matthew Pratt A. Saverio Curling RN, BA, [facility], CRRN Portal  [facility] Population Health RN Care Manager Direct Dial: [phone]  Fax: [phone]

## 2024-04-19 NOTE — Patient Outreach (Signed)
 Complex Care Management   Visit Note  04/19/2024  Name:  Matthew Pratt MRN: [id number] DOB: 1951-01-18  Situation: Referral received for Complex Care Management related to Recurrent Depression and Irritable Bowel Syndrome with diarrhea I obtained verbal consent from Patient.  Visit completed with Patient & RNCM  on the phone  Background:   Past Medical History:  Diagnosis Date   Anxiety    Arthritis    BPH (benign prostatic hypertrophy)    CAD (coronary artery disease)    s/p STEMI 10/15/10 w/ Promus DES placed x1 in the first OM   Colon polyps    Complication of anesthesia    pt. reported 2015 penile inplant when getting some anesthesia had chest pain,they readjusted  the meds. and continued with surgery. Had no further problems di not have to follow up with anyone.   Depression    Depression with anxiety    Dupuytren's contracture of hand    ED (erectile dysfunction)    GERD (gastroesophageal reflux disease)    Glaucoma    HLD (hyperlipidemia)    Hypocontractile bladder    Hypothyroidism    MI (myocardial infarction) (HCC)    denies   OSA on CPAP    Peyronie's disease    Rosacea    Skin cancer    BCC    Assessment: Patient Reported Symptoms:  Cognitive Cognitive Status: Alert and oriented to person, place, and time, Insightful and able to interpret abstract concepts   Health Maintenance Behaviors: Healthy diet, Exercise, Annual physical exam Health Facilitated by: Healthy diet  Neurological Neurological Review of Symptoms: No symptoms reported    HEENT HEENT Symptoms Reported: No symptoms reported      Cardiovascular Cardiovascular Symptoms Reported: No symptoms reported Does patient have uncontrolled Hypertension?: No Cardiovascular Conditions: Coronary artery disease, High blood cholesterol, Myocardial infarction, Dysrhythmia Cardiovascular Management Strategies: Medical device Weight: 192 lb (87.1 kg) Cardiovascular Self-Management Outcome: 4  (good) Cardiovascular Comment: 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  Respiratory Respiratory Symptoms Reported: No symptoms reported Respiratory Conditions: Sleep disordered breathing Respiratory Self-Management Outcome: 4 (good) Respiratory Comment: utilizes CPAP for OSA  Endocrine Patient reports the following symptoms related to hypoglycemia or hyperglycemia : No symptoms reported Is patient diabetic?: No Endocrine Conditions: Other Other Endocrine Conditions: Hypothyroid Endocrine Management Strategies: Routine screening, Medication therapy Endocrine Self-Management Outcome: 4 (good) Endocrine Comment: takes Synthroid  137 mcg daily  Gastrointestinal Gastrointestinal Symptoms Reported: Irritable bowel syndrome, Diarrhea Gastrointestinal Conditions: Irritable bowel syndrome, Reflux/heartburn, Other Other Gastrointestinal Conditions: Barrett's Esophagus, hepatic steatosis Gastrointestinal Management Strategies: Medication therapy, Diet modification, Adequate rest, Activity, Exercise Gastrointestinal Self-Management Outcome: 3 (uncertain) Gastrointestinal Comment: multiple episodes of diarrhea can impact the absorption of extended release meds patient takes such as his antidepressent Effexor  XR 150mg  24 hr capsules Nutrition Risk Screen (CP): No indicators present (utilizes Intermittent Fasting, eats between 12noon - 3pm, has been intentionally losing weight, currently weighs 192 - 194 pounds, down from 220)  Genitourinary Genitourinary Symptoms Reported: No symptoms reported Genitourinary Conditions: Other Other Genitourinary Conditions: BPH  Integumentary Integumentary Symptoms Reported: No symptoms reported    Musculoskeletal Musculoskelatal Symptoms Reviewed: No symptoms reported Musculoskeletal Management Strategies: Exercise, Adequate rest, Activity   Patient at Risk for Falls Due to: No Fall Risks  Psychosocial Psychosocial Symptoms Reported: Depression - if selected  complete PHQ 2-9 Behavioral Health Conditions: Depression Behavioral Management Strategies: Adequate rest, Coping strategies, Counseling, Medication therapy, Optimal nutrition intake, Exercise, Activity Behavioral Health Self-Management Outcome: 4 (good) Major Change/Loss/Stressor/Fears (CP): Medical  condition, self Quality of Family Relationships: unable to assess Do you feel physically threatened by others?: No      04/19/2024   11:08 PM  Depression screen PHQ 2/9  Decreased Interest 0  Down, Depressed, Hopeless 1  PHQ - 2 Score 1    There were no vitals filed for this visit.  Medications Reviewed Today     Reviewed by Randye Buttner, RN (Registered Nurse) on 04/19/24 at 2203  Med List Status: <None>   Medication Order Taking? Sig Documenting Provider Last Dose Status Informant  acetaminophen  (TYLENOL ) 500 MG tablet [id number] Yes Take 500 mg by mouth every 6 (six) hours as needed for mild pain. [provider] Taking Active Self           Med Note Coit Dasen, Marva Sleight Mar 10, 2023  2:47 PM) Patient takes 375 mg at night  aspirin  EC 81 MG tablet [id number] Yes Take 81 mg by mouth every morning. [provider] Taking Active Self           Med Note Felipe Horton, JEFFREY W   Fri Jun 27, 2020 11:32 PM)    Calcium  Carb-Cholecalciferol  (OYSTER SHELL CALCIUM  W/D) 500-5 MG-MCG TABS [id number] Yes Take by mouth daily in the afternoon. [provider] Taking Active            Med Note Joaquin Mulberry Dec 26, 2023  9:50 AM) Calcium  600mg  and Vit D 2000 units once daily  EPINEPHrine  0.3 mg/0.3 mL IJ SOAJ injection [id number] Yes Inject 0.3 mg into the muscle as needed. Marquetta Sit, MD Taking Active   Folic Acid (FOLATE PO) [id number] Yes Take 800 mcg by mouth daily at 2 PM. [provider] Taking Active   latanoprost  (XALATAN ) 0.005 % ophthalmic solution [id number] Yes Place 1 drop into both eyes at bedtime. [provider] Taking Active    nitroGLYCERIN  (NITROSTAT ) 0.4 MG SL tablet [id number]  Place 1 tablet (0.4 mg total) under the tongue every 5 (five) minutes as needed for chest pain.  Patient not taking: Reported on 03/10/2023   Jude Norton, NP  Expired 04/06/23 2359            Med Note Coit Dasen, EBONY J   Thu Mar 10, 2023  2:51 PM) Patient has if needed  pantoprazole  (PROTONIX ) 40 MG tablet [id number] Yes Take 1 tablet (40 mg total) by mouth daily. Marquetta Sit, MD Taking Active   senna (SENOKOT) 8.6 MG tablet [id number] Yes Take 1 tablet by mouth as needed for constipation. [provider] Taking Active Self  SYNTHROID  137 MCG tablet [id number] Yes Take 137 mcg by mouth daily before breakfast. [provider] Taking Active   timolol  (TIMOPTIC ) 0.5 % ophthalmic solution [id number] Yes Place 1 drop into both eyes 2 (two) times daily. [provider] Taking Active Self  traZODone  (DESYREL ) 50 MG tablet [id number] No Take 0.5-1 tablets (25-50 mg total) by mouth at bedtime as needed (insomnia).  Patient not taking: Reported on 04/19/2024   Marquetta Sit, MD Not Taking Active            Med Note Saverio Curling, Gilverto Dileonardo A   Thu Apr 19, 2024 10:03 PM) Patient states he has not been using, but has if needed  TURMERIC PO [id number] Yes Take 1,500 mg by mouth daily. [provider] Taking Active Self  venlafaxine  XR (EFFEXOR  XR) 150 MG 24 hr capsule [id number] Yes Take 1 capsule (150 mg  total) by mouth daily with breakfast. Marquetta Sit, MD Taking Active   vitamin B-12 (CYANOCOBALAMIN ) 500 MCG tablet [id number] Yes Take 500 mcg by mouth daily. [provider] Taking Active Self  zinc  gluconate 50 MG tablet [id number] Yes Take 50 mg by mouth daily at 6 PM. [provider] Taking Active Self            Recommendation:   PCP Follow-up on 04/23/24  Follow Up Plan:   Telephone follow up appointment date/time:  07/19/24 @ 9:30am with RN Case Manager  Bartholomew Light A. Saverio Curling RN, BA,  [facility], CRRN [place]  [facility] Population Health RN Care Manager Direct Dial: [phone]  Fax: [phone]

## 2024-04-19 NOTE — Telephone Encounter (Signed)
 Noted.

## 2024-04-23 ENCOUNTER — Ambulatory Visit: Payer: Medicare Other | Admitting: Family Medicine

## 2024-04-23 ENCOUNTER — Encounter: Payer: Self-pay | Admitting: Family Medicine

## 2024-04-23 VITALS — BP 100/60 | HR 77 | Temp 97.7°F | Wt 197.1 lb

## 2024-04-23 DIAGNOSIS — L570 Actinic keratosis: Secondary | ICD-10-CM | POA: Diagnosis not present

## 2024-04-23 DIAGNOSIS — F339 Major depressive disorder, recurrent, unspecified: Secondary | ICD-10-CM | POA: Diagnosis not present

## 2024-04-23 DIAGNOSIS — F5104 Psychophysiologic insomnia: Secondary | ICD-10-CM

## 2024-04-23 NOTE — Progress Notes (Signed)
 Established Patient Office Visit  Subjective   Patient ID: Matthew Pratt, male    DOB: 24-Mar-1951  Age: 73 y.o. MRN: [id number]  Chief Complaint  Patient presents with   Medical Management of Chronic Issues    HPI   Matthew Pratt seen today for medical follow-up.  Generally doing very well.  Had a church picnic over the weekend which he enjoyed very much.  His depression is currently doing well on Effexor  XR 150 mg daily.  He feels like the higher dose of Effexor  is working well for him.  No adverse side effects.  Does still struggle with some sleep difficulties especially when he has to get up and go to the bathroom at night.  Trying to take trazodone  intermittently  He has intentionally been trying to lose weight.  Mostly doing this through intermittent fasting.  Basically eat and 1 main meal a day which is at lunch.  Weight is down approximately 10 pounds from last visit.  He has a goal of try to get his weight down to 185 pounds.  History of actinic keratoses.  He has a couple lesions on his face that he would like to have treated including  He has history of CAD, complete heart block, GERD with history of Barrett's esophagus, obstructive sleep apnea, hypothyroidism, recurrent depression.  He has ICD in place.  History of witnessed out-of-hospital cardiac arrest.  No recent chest pains or syncope.  GERD controlled with Protonix  40 mg daily  Past Medical History:  Diagnosis Date   Anxiety    Arthritis    BPH (benign prostatic hypertrophy)    CAD (coronary artery disease)    s/p STEMI 10/15/10 w/ Promus DES placed x1 in the first OM   Colon polyps    Complication of anesthesia    pt. reported 2015 penile inplant when getting some anesthesia had chest pain,they readjusted  the meds. and continued with surgery. Had no further problems di not have to follow up with anyone.   Depression    Depression with anxiety    Dupuytren's contracture of hand    ED (erectile dysfunction)     GERD (gastroesophageal reflux disease)    Glaucoma    HLD (hyperlipidemia)    Hypocontractile bladder    Hypothyroidism    MI (myocardial infarction) (HCC)    denies   OSA on CPAP    Peyronie's disease    Rosacea    Skin cancer    BCC   Past Surgical History:  Procedure Laterality Date   CHOLECYSTECTOMY N/A 02/10/2021   Procedure: LAPAROSCOPIC CHOLECYSTECTOMY;  Surgeon: Oza Blumenthal, MD;  Location: [facility] OR;  Service: General;  Laterality: N/A;   COLONOSCOPY WITH ESOPHAGOGASTRODUODENOSCOPY (EGD)     multiple   CORONARY STENT PLACEMENT     ICD IMPLANT N/A 12/20/2022   Procedure: ICD IMPLANT;  Surgeon: Tammie Fall, MD;  Location: [facility] INVASIVE CV LAB;  Service: Cardiovascular;  Laterality: N/A;   INNER EAR SURGERY     LAPAROSCOPIC CHOLECYSTECTOMY Bilateral 02/09/2021   Dr.blackmon   LASIK  02/1998   LEFT HEART CATH AND CORONARY ANGIOGRAPHY N/A 12/15/2022   Procedure: LEFT HEART CATH AND CORONARY ANGIOGRAPHY;  Surgeon: Arleen Lacer, MD;  [facility] INVASIVE CV LAB;  Service: Cardiovascular;  Laterality: N/A;   PENILE PROSTHESIS IMPLANT     TONSILECTOMY, ADENOIDECTOMY, BILATERAL MYRINGOTOMY AND TUBES     TONSILLECTOMY     VASECTOMY     WISDOM TOOTH EXTRACTION  reports that he has never smoked. He has never used smokeless tobacco. He reports current alcohol use. He reports that he does not use drugs. family history includes Arthritis in his paternal grandfather; Basal cell carcinoma in his father and sister; COPD in his mother and another family member; Cancer in his father; Colon cancer in his paternal grandfather and paternal grandmother; Heart failure in his maternal grandfather; Liver cancer in his father; Melanoma in his sister; Rheum arthritis in his maternal grandmother; Varicose Veins in his father. Allergies  Allergen Reactions   Bee Venom Hives and Swelling   Crestor [Rosuvastatin] Other (See Comments)    Myalgia.     Iodinated Contrast Media Hives and  Itching   Sulfa Antibiotics Rash   Sulfonamide Derivatives Rash    Review of Systems  Constitutional:  Negative for malaise/fatigue.  Eyes:  Negative for blurred vision.  Respiratory:  Negative for shortness of breath.   Cardiovascular:  Negative for chest pain.  Gastrointestinal:  Negative for abdominal pain.  Genitourinary:  Negative for dysuria.  Neurological:  Negative for dizziness, weakness and headaches.  Psychiatric/Behavioral:  Negative for suicidal ideas. The patient has insomnia.       Objective:     BP 100/60 (BP Location: Left Arm, Patient Position: Sitting, Cuff Size: Normal)   Pulse 77   Temp 97.7 F (36.5 C) (Oral)   Wt 197 lb 1.6 oz (89.4 kg)   SpO2 97%   BMI 26.00 kg/m  BP Readings from Last 3 Encounters:  04/23/24 100/60  01/27/24 110/70  12/28/23 118/80   Wt Readings from Last 3 Encounters:  04/23/24 197 lb 1.6 oz (89.4 kg)  04/19/24 192 lb (87.1 kg)  01/27/24 209 lb 12.8 oz (95.2 kg)      Physical Exam Vitals reviewed.  Constitutional:      General: He is not in acute distress.    Appearance: Normal appearance. He is not ill-appearing.  Cardiovascular:     Rate and Rhythm: Normal rate and regular rhythm.  Pulmonary:     Effort: Pulmonary effort is normal.     Breath sounds: Normal breath sounds. No wheezing or rales.  Musculoskeletal:     Right lower leg: No edema.     Left lower leg: No edema.  Skin:    Comments: Couple of whitish scaly patches including right forehead and left temporal region.  No nodular changes.  No ulceration.  Each of these approximately 3 x 5 mm in size  Neurological:     Mental Status: He is alert.      No results found for any visits on 04/23/24.  Last CBC Lab Results  Component Value Date   WBC 5.4 10/18/2023   HGB 14.0 10/18/2023   HCT 41.1 10/18/2023   MCV 102.3 (H) 10/18/2023   MCH 34.5 (H) 04/10/2023   RDW 12.8 10/18/2023   PLT 206.0 10/18/2023   Last metabolic panel Lab Results  Component  Value Date   GLUCOSE 93 04/10/2023   NA 135 04/10/2023   K 4.3 04/10/2023   CL 102 04/10/2023   CO2 26 04/10/2023   BUN 17 04/10/2023   CREATININE 1.04 04/10/2023   GFRNONAA >60 04/10/2023   CALCIUM  9.2 04/10/2023   PHOS 2.3 (L) 12/17/2022   PROT 5.9 (L) 12/17/2022   ALBUMIN 3.3 (L) 12/17/2022   BILITOT 1.4 (H) 12/17/2022   ALKPHOS 59 12/17/2022   AST 76 (H) 12/17/2022   ALT 107 (H) 12/17/2022   ANIONGAP 7 04/10/2023  Last lipids Lab Results  Component Value Date   CHOL 170 12/18/2022   HDL 45 12/18/2022   LDLCALC 100 (H) 12/18/2022   TRIG 123 12/18/2022   CHOLHDL 3.8 12/18/2022   Last hemoglobin A1c Lab Results  Component Value Date   HGBA1C 5.3 12/18/2022      The ASCVD Risk score (Arnett DK, et al., 2019) failed to calculate for the following reasons:   Risk score cannot be calculated because patient has a medical history suggesting prior/existing ASCVD    Assessment & Plan:   #1 history of recurrent depression currently stable on Effexor  XR 150 mg daily.  He has also had extensive counseling in the past  #2 intermittent insomnia.  Patient well aware of factors affecting sleep quality.  Continue trazodone  as needed  #3 actinic keratosis involving right forehead and left side of face Patient aware of risk including pain, blistering, low risk of infection.  He consented to treatment with liquid nitrogen.  Both areas were treated as above and he tolerated well.  Patient aware of importance of sun protection with hat and/or sunblock  Glean Lamy, MD

## 2024-04-25 ENCOUNTER — Ambulatory Visit (INDEPENDENT_AMBULATORY_CARE_PROVIDER_SITE_OTHER): Payer: Medicare Other

## 2024-04-25 VITALS — BP 118/62 | HR 63 | Temp 97.7°F | Ht 73.0 in | Wt 197.0 lb

## 2024-04-25 DIAGNOSIS — Z0000 Encounter for general adult medical examination without abnormal findings: Secondary | ICD-10-CM

## 2024-04-25 NOTE — Progress Notes (Signed)
 Subjective:   Matthew Pratt is a 73 y.o. who presents for a Medicare Wellness preventive visit.  As a reminder, Annual Wellness Visits don't include a physical exam, and some assessments may be limited, especially if this visit is performed virtually. We may recommend an in-person follow-up visit with your provider if needed.  Visit Complete: In person    Persons Participating in Visit: Patient.  AWV Questionnaire: Yes: Patient Medicare AWV questionnaire was completed by the patient on 04/21/24; I have confirmed that all information answered by patient is correct and no changes since this date.  Cardiac Risk Factors include: advanced age (>26men, >97 women);male gender     Objective:     Today's Vitals   04/25/24 1417  BP: 118/62  Pulse: 63  Temp: 97.7 F (36.5 C)  TempSrc: Oral  SpO2: 93%  Weight: 197 lb (89.4 kg)  Height: 6\' 1"  (1.854 m)   Body mass index is 25.99 kg/m.     04/25/2024    2:33 PM 04/22/2023   12:55 PM 04/10/2023   11:07 AM 12/16/2022    2:00 AM 05/05/2022   12:35 PM 04/19/2022    2:01 PM 04/17/2021   10:07 AM  Advanced Directives  Does Patient Have a Medical Advance Directive? Yes Yes Yes Unable to assess, patient is non-responsive or altered mental status Yes Yes Yes  Type of Advance Directive Healthcare Power of [place];Living will Healthcare Power of [place];Living will   Living will Healthcare Power of [place];Living will Healthcare Power of [place];Living will  Does patient want to make changes to medical advance directive? No - Patient declined No - Patient declined   No - Patient declined No - Patient declined   Copy of Healthcare Power of Attorney in Chart? Yes - validated most recent copy scanned in chart (See row information) Yes - validated most recent copy scanned in chart (See row information)    No - copy requested No - copy requested    Current Medications (verified) Outpatient Encounter Medications as of 04/25/2024  Medication  Sig   acetaminophen  (TYLENOL ) 500 MG tablet Take 500 mg by mouth every 6 (six) hours as needed for mild pain.   aspirin  EC 81 MG tablet Take 81 mg by mouth every morning.   Calcium  Carb-Cholecalciferol  (OYSTER SHELL CALCIUM  W/D) 500-5 MG-MCG TABS Take by mouth daily in the afternoon.   EPINEPHrine  0.3 mg/0.3 mL IJ SOAJ injection Inject 0.3 mg into the muscle as needed.   Folic Acid (FOLATE PO) Take 800 mcg by mouth daily at 2 PM.   latanoprost  (XALATAN ) 0.005 % ophthalmic solution Place 1 drop into both eyes at bedtime.   nitroGLYCERIN  (NITROSTAT ) 0.4 MG SL tablet Place 1 tablet (0.4 mg total) under the tongue every 5 (five) minutes as needed for chest pain. (Patient not taking: Reported on 03/10/2023)   pantoprazole  (PROTONIX ) 40 MG tablet Take 1 tablet (40 mg total) by mouth daily.   senna (SENOKOT) 8.6 MG tablet Take 1 tablet by mouth as needed for constipation.   SYNTHROID  137 MCG tablet Take 137 mcg by mouth daily before breakfast.   timolol  (TIMOPTIC ) 0.5 % ophthalmic solution Place 1 drop into both eyes 2 (two) times daily.   traZODone  (DESYREL ) 50 MG tablet Take 0.5-1 tablets (25-50 mg total) by mouth at bedtime as needed (insomnia).   TURMERIC PO Take 1,500 mg by mouth daily.   venlafaxine  XR (EFFEXOR  XR) 150 MG 24 hr capsule Take 1 capsule (150 mg total) by mouth daily  with breakfast.   vitamin B-12 (CYANOCOBALAMIN ) 500 MCG tablet Take 500 mcg by mouth daily.   zinc  gluconate 50 MG tablet Take 50 mg by mouth daily at 6 PM.   No facility-administered encounter medications on file as of 04/25/2024.    Allergies (verified) Bee venom, Crestor [rosuvastatin], Iodinated contrast media, Sulfa antibiotics, and Sulfonamide derivatives   History: Past Medical History:  Diagnosis Date   Anxiety    Arthritis    BPH (benign prostatic hypertrophy)    CAD (coronary artery disease)    s/p STEMI 10/15/10 w/ Promus DES placed x1 in the first OM   Colon polyps    Complication of anesthesia     pt. reported 2015 penile inplant when getting some anesthesia had chest pain,they readjusted  the meds. and continued with surgery. Had no further problems di not have to follow up with anyone.   Depression    Depression with anxiety    Dupuytren's contracture of hand    ED (erectile dysfunction)    GERD (gastroesophageal reflux disease)    Glaucoma    HLD (hyperlipidemia)    Hypocontractile bladder    Hypothyroidism    MI (myocardial infarction) (HCC)    denies   OSA on CPAP    Peyronie's disease    Rosacea    Skin cancer    BCC   Past Surgical History:  Procedure Laterality Date   CHOLECYSTECTOMY N/A 02/10/2021   Procedure: LAPAROSCOPIC CHOLECYSTECTOMY;  Surgeon: Oza Blumenthal, MD;  Location: [facility] OR;  Service: General;  Laterality: N/A;   COLONOSCOPY WITH ESOPHAGOGASTRODUODENOSCOPY (EGD)     multiple   CORONARY STENT PLACEMENT     ICD IMPLANT N/A 12/20/2022   Procedure: ICD IMPLANT;  Surgeon: Tammie Fall, MD;  Location: [facility] INVASIVE CV LAB;  Service: Cardiovascular;  Laterality: N/A;   INNER EAR SURGERY     LAPAROSCOPIC CHOLECYSTECTOMY Bilateral 02/09/2021   Dr.blackmon   LASIK  02/1998   LEFT HEART CATH AND CORONARY ANGIOGRAPHY N/A 12/15/2022   Procedure: LEFT HEART CATH AND CORONARY ANGIOGRAPHY;  Surgeon: Arleen Lacer, MD;  Location: [facility] INVASIVE CV LAB;  Service: Cardiovascular;  Laterality: N/A;   PENILE PROSTHESIS IMPLANT     TONSILECTOMY, ADENOIDECTOMY, BILATERAL MYRINGOTOMY AND TUBES     TONSILLECTOMY     VASECTOMY     WISDOM TOOTH EXTRACTION     Family History  Problem Relation Age of Onset   COPD Mother    Cancer Father        gallbladder cancer   Varicose Veins Father    Liver cancer Father    Basal cell carcinoma Father    Basal cell carcinoma Sister    Melanoma Sister    Rheum arthritis Maternal Grandmother    Heart failure Maternal Grandfather    Colon cancer Paternal Grandmother    Arthritis Paternal Grandfather    Colon cancer Paternal  Grandfather    COPD Other    Social History   Socioeconomic History   Marital status: Divorced    Spouse name: Not on file   Number of children: 2   Years of education: Not on file   Highest education level: Bachelor's degree (e.g., BA, AB, BS)  Occupational History   Occupation: Contractor    Comment: retired  Tobacco Use   Smoking status: Never   Smokeless tobacco: Never  Vaping Use   Vaping status: Never Used  Substance and Sexual Activity   Alcohol use: Yes  Comment: rare   Drug use: No   Sexual activity: Not on file  Other Topics Concern   Not on file  Social History Narrative   Divorced, 2 children   Retired Art gallery manager   No EtOH, tobacco, drugs   Social Drivers of Corporate investment banker Strain: Low Risk  (04/25/2024)   Overall Financial Resource Strain (CARDIA)    Difficulty of Paying Living Expenses: Not hard at all  Food Insecurity: No Food Insecurity (04/25/2024)   Hunger Vital Sign    Worried About Running Out of Food in the Last Year: Never true    Ran Out of Food in the Last Year: Never true  Transportation Needs: No Transportation Needs (04/25/2024)   PRAPARE - Administrator, Civil Service (Medical): No    Lack of Transportation (Non-Medical): No  Physical Activity: Insufficiently Active (04/25/2024)   Exercise Vital Sign    Days of Exercise per Week: 3 days    Minutes of Exercise per Session: 40 min  Stress: No Stress Concern Present (04/25/2024)   [employer] of Occupational Health - Occupational Stress Questionnaire    Feeling of Stress : Not at all  Social Connections: Moderately Integrated (04/25/2024)   Social Connection and Isolation Panel [NHANES]    Frequency of Communication with Friends and Family: More than three times a week    Frequency of Social Gatherings with Friends and Family: More than three times a week    Attends Religious Services: More than 4 times per year    Active Member of [employer] or  Organizations: Yes    Attends Engineer, structural: More than 4 times per year    Marital Status: Divorced    Tobacco Counseling Counseling given: Not Answered    Clinical Intake:  Pre-visit preparation completed: Yes  Pain : No/denies pain     BMI - recorded: 25.99 Nutritional Status: BMI 25 -29 Overweight Nutritional Risks: None Diabetes: No  Lab Results  Component Value Date   HGBA1C 5.3 12/18/2022   HGBA1C 5.6 08/13/2022   HGBA1C 5.4 09/30/2021     How often do you need to have someone help you when you read instructions, pamphlets, or other written materials from your doctor or pharmacy?: 1 - Never  Interpreter Needed?: No  Information entered by :: Farris Hong LPN   Activities of Daily Living      04/25/2024    2:31 PM 04/21/2024    9:43 AM  In your present state of health, do you have any difficulty performing the following activities:  Hearing? 0 0  Vision? 0 0  Difficulty concentrating or making decisions? 0 0  Walking or climbing stairs? 0 0  Dressing or bathing? 0 0  Doing errands, shopping? 0 0  Preparing Food and eating ? N N  Using the Toilet? N N  In the past six months, have you accidently leaked urine? N N  Do you have problems with loss of bowel control? N N  Managing your Medications? N N  Managing your Finances? N N  Housekeeping or managing your Housekeeping? N N    Patient Care Team: Marquetta Sit, MD as PCP - General (Family Medicine) Euell Herrlich, MD as PCP - Cardiology (Cardiology) Zilphia Hilt Daria Eddy, [facility] (Pharmacist) Randye Buttner, RN as [facility] Care Management  Indicate any recent Medical Services you may have received from other than [facility] providers in the past year (date may be approximate).     Assessment:  This is a routine wellness examination for Kadan.  Hearing/Vision screen Hearing Screening - Comments:: Wears Hearing Aids Vision Screening - Comments:: Wears rx glasses - up to date with  routine eye exams with  Dr Bernetta Brilliant   Goals Addressed               This Visit's Progress     Increase physical activity (pt-stated)        Remain active       Depression Screen      04/25/2024    2:17 PM 04/19/2024   11:08 PM 02/16/2024   10:16 AM 01/27/2024   10:44 AM 10/18/2023   12:07 PM 04/22/2023   12:47 PM 04/15/2023   10:17 AM  PHQ 2/9 Scores  PHQ - 2 Score 0 1 0 0 1 0 1  PHQ- 9 Score    2 4      Fall Risk      04/25/2024    2:32 PM 04/21/2024    9:43 AM 04/19/2024    9:53 PM 04/22/2023   12:49 PM 04/15/2023    9:07 AM  Fall Risk   Falls in the past year? 0 0  1 0  Number falls in past yr: 0 0  0 0  Injury with Fall? 0 0  1 1  Comment    Head and lower back injury, followed by medical attention.   Risk for fall due to : No Fall Risks  No Fall Risks Mental status change   Follow up Falls evaluation completed   Falls prevention discussed     MEDICARE RISK AT HOME:   Medicare Risk at Home Any stairs in or around the home?: Yes If so, are there any without handrails?: No Home free of loose throw rugs in walkways, pet beds, electrical cords, etc?: Yes Adequate lighting in your home to reduce risk of falls?: Yes Life alert?: No Use of a cane, walker or w/c?: No Grab bars in the bathroom?: Yes Shower chair or bench in shower?: No Elevated toilet seat or a handicapped toilet?: Yes  TIMED UP AND GO:  Was the test performed?  Yes  Length of time to ambulate 10 feet: 10 sec Gait steady and fast without use of assistive device  Cognitive Function: 6CIT completed        04/25/2024    2:32 PM 04/22/2023   12:55 PM 04/19/2022    2:02 PM  6CIT Screen  What Year? 0 points 0 points 0 points  What month? 0 points 0 points 0 points  What time? 0 points 0 points 0 points  Count back from 20 0 points 0 points 0 points  Months in reverse 0 points 0 points 0 points  Repeat phrase 0 points 0 points 0 points  Total Score 0 points 0 points 0 points     Immunizations Immunization History  Administered Date(s) Administered   Fluad Quad(high Dose 65+) 08/10/2019, 08/13/2022   Fluad Trivalent(High Dose 65+) 09/01/2023   Influenza, High Dose Seasonal PF 09/18/2020, 08/18/2021   Influenza-Unspecified 09/13/2016, 10/04/2017, 07/28/2018, 09/18/2020   PFIZER Comirnaty(Gray Top)Covid-19 Tri-Sucrose Vaccine 04/21/2021   PFIZER(Purple Top)SARS-COV-2 Vaccination 01/27/2020, 02/20/2020, 09/18/2020, 08/18/2021   PNEUMOCOCCAL CONJUGATE-20 07/22/2022   Pfizer Covid-19 Vaccine Bivalent Booster 30yrs & up 08/18/2021   Pneumococcal Conjugate-13 06/16/2017, 06/16/2017   Pneumococcal Polysaccharide-23 08/10/2019   Rsv, Bivalent, Protein Subunit Rsvpref,pf Pattricia Bores) 09/01/2023   Tdap 08/21/2018   Zoster Recombinant(Shingrix) 11/13/2019, 04/09/2020    Screening Tests Health Maintenance  Topic  Date Due   INFLUENZA VACCINE  07/06/2024   Medicare Annual Wellness (AWV)  04/25/2025   Colonoscopy  07/11/2028   DTaP/Tdap/Td (2 - Td or Tdap) 08/21/2028   Pneumonia Vaccine 21+ Years old  Completed   Hepatitis C Screening  Completed   Zoster Vaccines- Shingrix  Completed   HPV VACCINES  Aged Out   Meningococcal B Vaccine  Aged Out   COVID-19 Vaccine  Discontinued    Health Maintenance  There are no preventive care reminders to display for this patient. Health Maintenance Items Addressed:   Additional Screening:  Vision Screening: Recommended annual ophthalmology exams for early detection of glaucoma and other disorders of the eye.  Dental Screening: Recommended annual dental exams for proper oral hygiene  Community Resource Referral / Chronic Care Management: CRR required this visit?  No   CCM required this visit?  No   Plan:    I have personally reviewed and noted the following in the patient's chart:   Medical and social history Use of alcohol, tobacco or illicit drugs  Current medications and supplements including opioid  prescriptions. Patient is not currently taking opioid prescriptions. Functional ability and status Nutritional status Physical activity Advanced directives List of other physicians Hospitalizations, surgeries, and ER visits in previous 12 months Vitals Screenings to include cognitive, depression, and falls Referrals and appointments  In addition, I have reviewed and discussed with patient certain preventive protocols, quality metrics, and best practice recommendations. A written personalized care plan for preventive services as well as general preventive health recommendations were provided to patient.   Dewayne Ford, LPN   1/61/0960   After Visit Summary: (In Person-Printed) AVS printed and given to the patient  Notes: Nothing significant to report at this time.

## 2024-04-25 NOTE — Patient Instructions (Addendum)
 [name] , Thank you for taking time out of your busy schedule to complete your Annual Wellness Visit with me. I enjoyed our conversation and look forward to speaking with you again next year. I, as well as your care team,  appreciate your ongoing commitment to your health goals. Please review the following plan we discussed and let me know if I can assist you in the future. Your Game plan/ To Do List    Referrals: If you haven't heard from the office you've been referred to, please reach out to them at the phone provided.   Follow up Visits: Next Medicare AWV with our clinical staff: 05/03/25 @ 1:40p   Have you seen your provider in the last 6 months (3 months if uncontrolled diabetes)? 04/13/24 Next Office Visit with your provider:   Clinician Recommendations:  Aim for 30 minutes of exercise or brisk walking, 6-8 glasses of water, and 5 servings of fruits and vegetables each day.        This is a list of the screening recommended for you and due dates:  Health Maintenance  Topic Date Due   Flu Shot  07/06/2024   Medicare Annual Wellness Visit  04/25/2025   Colon Cancer Screening  07/11/2028   DTaP/Tdap/Td vaccine (2 - Td or Tdap) 08/21/2028   Pneumonia Vaccine  Completed   Hepatitis C Screening  Completed   Zoster (Shingles) Vaccine  Completed   HPV Vaccine  Aged Out   Meningitis B Vaccine  Aged Out   COVID-19 Vaccine  Discontinued    Advanced directives: (In Chart) A copy of your advanced directives are scanned into your chart should your provider ever need it. Advance Care Planning is important because it:  [x]  Makes sure you receive the medical care that is consistent with your values, goals, and preferences  [x]  It provides guidance to your family and loved ones and reduces their decisional burden about whether or not they are making the right decisions based on your wishes.  Follow the link provided in your after visit summary or read over the paperwork we have mailed to  you to help you started getting your Advance Directives in place. If you need assistance in completing these, please reach out to us  so that we can help you!  See attachments for Preventive Care and Fall Prevention Tips.

## 2024-05-02 NOTE — Addendum Note (Signed)
 Addended by: Lott Rouleau A on: 05/02/2024 03:26 PM   Modules accepted: Orders

## 2024-05-02 NOTE — Progress Notes (Signed)
 Remote ICD transmission.

## 2024-05-31 DIAGNOSIS — R7303 Prediabetes: Secondary | ICD-10-CM | POA: Diagnosis not present

## 2024-05-31 DIAGNOSIS — L814 Other melanin hyperpigmentation: Secondary | ICD-10-CM | POA: Diagnosis not present

## 2024-05-31 DIAGNOSIS — D485 Neoplasm of uncertain behavior of skin: Secondary | ICD-10-CM | POA: Diagnosis not present

## 2024-05-31 DIAGNOSIS — L821 Other seborrheic keratosis: Secondary | ICD-10-CM | POA: Diagnosis not present

## 2024-05-31 DIAGNOSIS — C44319 Basal cell carcinoma of skin of other parts of face: Secondary | ICD-10-CM | POA: Diagnosis not present

## 2024-05-31 DIAGNOSIS — D1801 Hemangioma of skin and subcutaneous tissue: Secondary | ICD-10-CM | POA: Diagnosis not present

## 2024-05-31 DIAGNOSIS — E039 Hypothyroidism, unspecified: Secondary | ICD-10-CM | POA: Diagnosis not present

## 2024-06-06 ENCOUNTER — Other Ambulatory Visit: Payer: Self-pay | Admitting: Family Medicine

## 2024-06-20 ENCOUNTER — Ambulatory Visit: Payer: Medicare Other

## 2024-06-20 DIAGNOSIS — I442 Atrioventricular block, complete: Secondary | ICD-10-CM

## 2024-06-21 LAB — CUP PACEART REMOTE DEVICE CHECK
Battery Remaining Longevity: 73 mo
Battery Remaining Percentage: 81 %
Battery Voltage: 3.01 V
Brady Statistic AP VP Percent: 1 %
Brady Statistic AP VS Percent: 81 %
Brady Statistic AS VP Percent: 1 %
Brady Statistic AS VS Percent: 18 %
Brady Statistic RA Percent Paced: 80 %
Brady Statistic RV Percent Paced: 1 %
Date Time Interrogation Session: 20250716040016
HighPow Impedance: 81 Ohm
HighPow Impedance: 81 Ohm
Implantable Lead Connection Status: 753985
Implantable Lead Connection Status: 753985
Implantable Lead Implant Date: 20240115
Implantable Lead Implant Date: 20240115
Implantable Lead Location: 753859
Implantable Lead Location: 753860
Implantable Lead Model: 7122
Implantable Pulse Generator Implant Date: 20240115
Lead Channel Impedance Value: 400 Ohm
Lead Channel Impedance Value: 480 Ohm
Lead Channel Pacing Threshold Amplitude: 0.5 V
Lead Channel Pacing Threshold Amplitude: 0.5 V
Lead Channel Pacing Threshold Pulse Width: 0.5 ms
Lead Channel Pacing Threshold Pulse Width: 0.5 ms
Lead Channel Sensing Intrinsic Amplitude: 5 mV
Lead Channel Sensing Intrinsic Amplitude: 7.6 mV
Lead Channel Setting Pacing Amplitude: 2 V
Lead Channel Setting Pacing Amplitude: 2.5 V
Lead Channel Setting Pacing Pulse Width: 0.5 ms
Lead Channel Setting Sensing Sensitivity: 0.5 mV
Pulse Gen Serial Number: 8956372

## 2024-06-22 ENCOUNTER — Encounter: Payer: Self-pay | Admitting: Family Medicine

## 2024-06-22 ENCOUNTER — Ambulatory Visit: Payer: Self-pay | Admitting: Internal Medicine

## 2024-06-27 DIAGNOSIS — C44319 Basal cell carcinoma of skin of other parts of face: Secondary | ICD-10-CM | POA: Diagnosis not present

## 2024-07-05 DIAGNOSIS — H401131 Primary open-angle glaucoma, bilateral, mild stage: Secondary | ICD-10-CM | POA: Diagnosis not present

## 2024-07-13 ENCOUNTER — Telehealth: Payer: Self-pay

## 2024-07-13 DIAGNOSIS — H401331 Pigmentary glaucoma, bilateral, mild stage: Secondary | ICD-10-CM | POA: Diagnosis not present

## 2024-07-13 DIAGNOSIS — H25813 Combined forms of age-related cataract, bilateral: Secondary | ICD-10-CM | POA: Diagnosis not present

## 2024-07-13 NOTE — Telephone Encounter (Signed)
   Pre-operative Risk Assessment    Patient Name: Matthew Pratt  DOB: May 22, 1951 MRN: [id number]   Date of last office visit: 11/17/23 DAMIEN BRAVER, NP Date of next office visit: NONE   Request for Surgical Clearance    Procedure:  COMPLEX CATARACT W/ISTENT/HYDRUS-RIGHT THEN LEFT  Date of Surgery:  Clearance 07/30/24         AND 08/13/24                       Surgeon:  DR MABEL HUM Surgeon's Group or Practice Name:  [facility] Phone number:  [phone]  EXT [phone] Fax number:  [phone]   Type of Clearance Requested:   - Medical  - Pharmacy:  Hold Aspirin  5-7 DAYS BEFORE EACH SURGERY (PLEASE INDICATE IN COMMENTS 5 OR 7 DAYS)   Type of Anesthesia:  IV SEDATION   Additional requests/questions:    Signed, Lucie DELENA Ku   07/13/2024, 4:30 PM

## 2024-07-16 NOTE — Telephone Encounter (Signed)
 Called surgeons office to ask about aspirin  had to leave a message for them to call preop team back.

## 2024-07-16 NOTE — Telephone Encounter (Signed)
 Spoke with [facility] and for the type of procedure they would really like to be able to hold the aspirin . But they stated that what our office says they will give it to Dr Corbin to review and go from there.

## 2024-07-16 NOTE — Telephone Encounter (Signed)
 Call x1; lvmtcb and mychart message sent to patient, to schedule from recall for preop clearance from 8/8 note - 8/25 procedure, and 1 yr f/u.

## 2024-07-16 NOTE — Telephone Encounter (Signed)
 Please also reach out to requesting office as given the patient's history Dr. Loni would prefer to not hold aspirin  prior to procedure, please confer with requesting office to see if continuing aspirin  is prohibitive to procedure.

## 2024-07-16 NOTE — Telephone Encounter (Signed)
   Name: Matthew Pratt  DOB: 10-Aug-1951  MRN: [id number]  Primary Cardiologist: Soyla DELENA Merck, MD  Chart reviewed as part of pre-operative protocol coverage. Because of [name]'s past medical history and time since last visit, he will require a follow-up in-office visit in order to better assess preoperative cardiovascular risk.  Pre-op covering staff: - Please schedule appointment and call patient to inform them. If patient already had an upcoming appointment within acceptable timeframe, please add pre-op clearance to the appointment notes so provider is aware. - Please contact requesting surgeon's office via preferred method (i.e, phone, fax) to inform them of need for appointment prior to surgery.  This message will also be routed to Dr. Acharya for input on holding aspirin  as requested below so that this information is available to the clearing provider at time of patient's appointment.   [name] D [name], NP  07/16/2024, 3:54 PM

## 2024-07-16 NOTE — Telephone Encounter (Signed)
 Call x2; outreach made to patient in attempts to reach him to schedule for preop clearance from 8/8 note - 8/25 procedure, and 1 yr f/u. He states that he is in [place], [place] visiting his sister and will be traveling home tomorrow. Advised my only opening prior to 8/25 as of this moment is 8/12, but with patient traveling, this appt will not work for him. He states he will give the office a call back to see if there is something sooner than 8/25. I let him know that incase there is not an opening with EP APP prior to 8/25, I have held 8/28 at 2:45pm with Daphne Barrack, NP. I advised him to ask for [facility] when he calls in Wednesday, as I will not be in the office Wednesday.

## 2024-07-16 NOTE — Telephone Encounter (Signed)
 Needs OV prior to clearing device.

## 2024-07-17 NOTE — Progress Notes (Signed)
 Cardiology Office Note Date:  07/17/2024  Patient ID:  Matthew Pratt, DOB 26-Oct-1951, MRN [id number] PCP:  Micheal Wolm ORN, MD  Cardiologist:  Dr. Loni Electrophysiologist: Dr. Waddell     Chief Complaint:  annual visit, pre-op  History of Present Illness: Matthew Pratt is a 73 y.o. male with history of  BPH, HLD, OSA w/CPAP  CAD (prior STEMI 2011 w/PCI to OM1),  OOH arrest    Admitted 12/15/22 after a witnessed collapse, by chart record, started CPR immediately.  Did CPR for 8 minutes and delivered 3 defibrillatory shocks. By the time EMS had arrived, ROSC had been achieved. Intubated in the field Presenting HS Trop only 60 EMS reviewed Pt on their arrival was unresponsive though had pulse and BP Reported AED defibrillations x3 prior to their arrival No ACLS meds LHC noted a CTO of the OM, with collaterals and non-obstructive disease otherwise, TTE noted preserved LVEF  He was extubated, off pressors, developed bradycardia with back to back V pausing/standstill events w/CHB 11.4 sec > 5.1 0 seconds  Initially with post arrest encephalopathy that did eventually improve Underwent ICD implant 12/20/22 Discharged 12/21/22  I saw him 01/04/23 He is doing very well. No CP, palpitations No syncope. He was at his PMD office last week and missed his ride home, ended up walking home (about 2 miles), did fine with it. He has little if any memory of his days in ICU Remains bewildered by the events and why this happened. No changes were made Well healed  Seeing cards team and Dr. Waddell since then Most recently cards APP 11/17/23,  Recovered from an URI and prior to that COVID, symptomatic with some heaviness/SOB > no cardiac concerns, complaints Suspected symptoms 2/2 pulm, not cardiac Planned for updated echo No new ischemic eval felt needed  LVEF 55-60%, no WMA  Pending complex cataract surgery w/sedation Needs 5-7 day ASA hold Unclear yet, what Dr. Laurence  preference is/recommendations  RCRI score is one (0.9%)  TODAY  He feels quite well. Exercises regularly, recumbent bike and some weights, usually every other day, gets ~ on the bike done regularly, when walking outside does about 2 miles Reports good exertional capacity No CP, palpitations or cardiac awareness No SOB, Denies DOE No near syncope or syncope     Device information Abbot dual chamber ICD implanted 12/20/22 Secondary prevention   Past Medical History:  Diagnosis Date   Anxiety    Arthritis    BPH (benign prostatic hypertrophy)    CAD (coronary artery disease)    s/p STEMI 10/15/10 w/ Promus DES placed x1 in the first OM   Colon polyps    Complication of anesthesia    pt. reported 2015 penile inplant when getting some anesthesia had chest pain,they readjusted  the meds. and continued with surgery. Had no further problems di not have to follow up with anyone.   Depression    Depression with anxiety    Dupuytren's contracture of hand    ED (erectile dysfunction)    GERD (gastroesophageal reflux disease)    Glaucoma    HLD (hyperlipidemia)    Hypocontractile bladder    Hypothyroidism    MI (myocardial infarction) (HCC)    denies   OSA on CPAP    Peyronie's disease    Rosacea    Skin cancer    BCC    Past Surgical History:  Procedure Laterality Date   CHOLECYSTECTOMY N/A 02/10/2021   Procedure: LAPAROSCOPIC CHOLECYSTECTOMY;  Surgeon: Vernetta Berg, MD;  Location: [facility];  Service: General;  Laterality: N/A;   COLONOSCOPY WITH ESOPHAGOGASTRODUODENOSCOPY (EGD)     multiple   CORONARY STENT PLACEMENT     ICD IMPLANT N/A 12/20/2022   Procedure: ICD IMPLANT;  Surgeon: Waddell Danelle ORN, MD;  Location: [facility] INVASIVE CV LAB;  Service: Cardiovascular;  Laterality: N/A;   INNER EAR SURGERY     LAPAROSCOPIC CHOLECYSTECTOMY Bilateral 02/09/2021   Dr.blackmon   LASIK  02/1998   LEFT HEART CATH AND CORONARY ANGIOGRAPHY N/A 12/15/2022   Procedure: LEFT HEART  CATH AND CORONARY ANGIOGRAPHY;  Surgeon: Anner Alm ORN, MD;  Location: [facility] INVASIVE CV LAB;  Service: Cardiovascular;  Laterality: N/A;   PENILE PROSTHESIS IMPLANT     TONSILECTOMY, ADENOIDECTOMY, BILATERAL MYRINGOTOMY AND TUBES     TONSILLECTOMY     VASECTOMY     WISDOM TOOTH EXTRACTION      Current Outpatient Medications  Medication Sig Dispense Refill   acetaminophen  (TYLENOL ) 500 MG tablet Take 500 mg by mouth every 6 (six) hours as needed for mild pain.     aspirin  EC 81 MG tablet Take 81 mg by mouth every morning.     Calcium  Carb-Cholecalciferol  (OYSTER SHELL CALCIUM  W/D) 500-5 MG-MCG TABS Take by mouth daily in the afternoon.     EPINEPHrine  0.3 mg/0.3 mL IJ SOAJ injection Inject 0.3 mg into the muscle as needed. 1 each 2   Folic Acid (FOLATE PO) Take 800 mcg by mouth daily at 2 PM.     latanoprost  (XALATAN ) 0.005 % ophthalmic solution Place 1 drop into both eyes at bedtime.     nitroGLYCERIN  (NITROSTAT ) 0.4 MG SL tablet Place 1 tablet (0.4 mg total) under the tongue every 5 (five) minutes as needed for chest pain. (Patient not taking: Reported on 03/10/2023) 25 tablet 3   pantoprazole  (PROTONIX ) 40 MG tablet TAKE 1 TABLET BY MOUTH EVERY DAY 90 tablet 1   senna (SENOKOT) 8.6 MG tablet Take 1 tablet by mouth as needed for constipation.     SYNTHROID  137 MCG tablet Take 137 mcg by mouth daily before breakfast.     timolol  (TIMOPTIC ) 0.5 % ophthalmic solution Place 1 drop into both eyes 2 (two) times daily.     traZODone  (DESYREL ) 50 MG tablet TAKE 1/2 TO 1 TABLET BY MOUTH AT BEDTIME AS NEEDED FOR INSOMNIA 90 tablet 1   TURMERIC PO Take 1,500 mg by mouth daily.     venlafaxine  XR (EFFEXOR  XR) 150 MG 24 hr capsule Take 1 capsule (150 mg total) by mouth daily with breakfast. 90 capsule 3   vitamin B-12 (CYANOCOBALAMIN ) 500 MCG tablet Take 500 mcg by mouth daily.     zinc  gluconate 50 MG tablet Take 50 mg by mouth daily at 6 PM.     No current facility-administered medications for this  visit.    Allergies:   Bee venom, Crestor [rosuvastatin], Iodinated contrast media, Sulfa antibiotics, and Sulfonamide derivatives   Social History:  The patient  reports that he has never smoked. He has never used smokeless tobacco. He reports current alcohol use. He reports that he does not use drugs.   Family History:  The patient's family history includes Arthritis in his paternal grandfather; Basal cell carcinoma in his father and sister; COPD in his mother and another family member; Cancer in his father; Colon cancer in his paternal grandfather and paternal grandmother; Heart failure in his maternal grandfather; Liver cancer in his father; Melanoma in his sister; Rheum  arthritis in his maternal grandmother; Varicose Veins in his father.  ROS:  Please see the history of present illness.    All other systems are reviewed and otherwise negative.   PHYSICAL EXAM:  VS:  There were no vitals taken for this visit. BMI: There is no height or weight on file to calculate BMI. Well nourished, well developed, in no acute distress HEENT: normocephalic, atraumatic Neck: no JVD, carotid bruits or masses Cardiac: RRR; no significant murmurs, no rubs, or gallops Lungs: CTA b/l, no wheezing, rhonchi or rales Abd: soft, nontender MS: no deformity or  atrophy Ext: no edema Skin: warm and dry, no rash Neuro:  No gross deficits appreciated Psych: euthymic mood, full affect  ICD site: stable, no erythema, edema, fluctuation, tethering or discomfort   EKG:  done today and reviewed by myself AP/VS 60bpm  Device interrogation done today and reviewed by myself:  Battery and lead measurements are good 3 episodes labeled AMS All are brief, 2 ar 1:1 One is true Afib 40 seconds No VT   12/21/23: TTE 1. Left ventricular ejection fraction, by estimation, is 55 to 60%. Left  ventricular ejection fraction by 3D volume is 55 %. The left ventricle has  normal function. The left ventricle has no regional  wall motion  abnormalities. Left ventricular diastolic   parameters were normal.   2. Right ventricular systolic function is normal. The right ventricular  size is normal.   3. The mitral valve is normal in structure. Trivial mitral valve  regurgitation. No evidence of mitral stenosis.   4. The aortic valve was not well visualized. Aortic valve regurgitation  is trivial. No aortic stenosis is present.    12/15/22: TTE 1. Left ventricular ejection fraction, by estimation, is 55%. The left  ventricle has low normal function. The left ventricle demonstrates  regional wall motion abnormalities (see scoring diagram/findings for  description). Left ventricular diastolic  parameters were normal.   2. Right ventricular systolic function is mildly reduced. The right  ventricular size is mildly enlarged. Tricuspid regurgitation signal is  inadequate for assessing PA pressure.   3. Right atrial size was mildly dilated.   4. The mitral valve is grossly normal. Mild mitral valve regurgitation.  No evidence of mitral stenosis.   5. The aortic valve is grossly normal. There is mild thickening of the  aortic valve. Aortic valve regurgitation is not visualized. No aortic  stenosis is present.   6. The inferior vena cava is normal in size with greater than 50%  respiratory variability, suggesting right atrial pressure of 3 mmHg.        12/15/22: LHC  Prox LAD to Mid LAD lesion is 55% stenosed.  Dist LAD lesion is 45% stenosed.   Prox Cx to Mid Cx lesion is 55% stenosed.   1st Mrg (previously placed Drug Eluting stent is 100% stenosed -> CTO with left to left and right left lateral   Prox RCA lesion is 60% stenosed. Mid RCA lesion is 55% stenosed.   LV end diastolic pressure is low.   There is no aortic valve stenosis.   POST-CATH DIAGNOSES Diffuse moderate to severe disease with calcification in all the proximal vessels, and likely CTO of previous OM1 stent filling via collaterals from both left  to right and right to left. No obvious culprit lesion to explain cardiac arrest. Low LVEDP of 6 mmHg. A-line pressures were in the high 80s to low 90s compared to cuff . Hypokalemia noted on labs-potassium 2.9.  Was started on IV potassium 10 mg once    Recent Labs: 10/18/2023: Hemoglobin 14.0; Platelets 206.0  No results found for requested labs within last 365 days.   CrCl cannot be calculated (Patient's most recent lab result is older than the maximum 21 days allowed.).   Wt Readings from Last 3 Encounters:  04/25/24 197 lb (89.4 kg)  04/23/24 197 lb 1.6 oz (89.4 kg)  04/19/24 192 lb (87.1 kg)     Other studies reviewed: Additional studies/records reviewed today include: summarized above  ASSESSMENT AND PLAN:  ICD intact function no programming changes made  Cardiac arrest No VT/therapies  CAD No anginal symptoms C/w Dr. Costella  5.  Pre-op Cataract surgery felt to be a low cardiac risk procedure RCRI score is on (0.9% risk) Excellent exertional capacity, very active, exercising regularly defer ASA as per Dr. Loni no particular device management needed for cataract suregy unless cautery is planned  He is not device dependent If planned/need for cautery > place/tape a magnet over his device   Disposition: F/u with remotes, in clinic with EP again, in a year, sooner if needed.  Due to see Dr. Costella in December  Current medicines are reviewed at length with the patient today.  The patient did not have any concerns regarding medicines.  Bonney Charlies Arthur, PA-C 07/17/2024 5:09 PM     [facility] [address] [place] [address] [phone] (office)  [phone] (fax)

## 2024-07-18 ENCOUNTER — Ambulatory Visit: Attending: Physician Assistant | Admitting: Physician Assistant

## 2024-07-18 VITALS — BP 116/70 | Ht 73.0 in | Wt 198.0 lb

## 2024-07-18 DIAGNOSIS — Z01818 Encounter for other preprocedural examination: Secondary | ICD-10-CM | POA: Diagnosis not present

## 2024-07-18 DIAGNOSIS — Z95810 Presence of automatic (implantable) cardiac defibrillator: Secondary | ICD-10-CM

## 2024-07-18 DIAGNOSIS — I2510 Atherosclerotic heart disease of native coronary artery without angina pectoris: Secondary | ICD-10-CM | POA: Diagnosis not present

## 2024-07-18 DIAGNOSIS — I469 Cardiac arrest, cause unspecified: Secondary | ICD-10-CM

## 2024-07-18 LAB — CUP PACEART INCLINIC DEVICE CHECK
Battery Remaining Longevity: 75 mo
Brady Statistic RA Percent Paced: 80 %
Brady Statistic RV Percent Paced: 0.28 %
Date Time Interrogation Session: 20250813183905
HighPow Impedance: 72 Ohm
Implantable Lead Connection Status: 753985
Implantable Lead Connection Status: 753985
Implantable Lead Implant Date: 20240115
Implantable Lead Implant Date: 20240115
Implantable Lead Location: 753859
Implantable Lead Location: 753860
Implantable Lead Model: 7122
Implantable Pulse Generator Implant Date: 20240115
Lead Channel Impedance Value: 400 Ohm
Lead Channel Impedance Value: 475 Ohm
Lead Channel Pacing Threshold Amplitude: 0.5 V
Lead Channel Pacing Threshold Amplitude: 0.5 V
Lead Channel Pacing Threshold Amplitude: 0.75 V
Lead Channel Pacing Threshold Amplitude: 0.75 V
Lead Channel Pacing Threshold Pulse Width: 0.5 ms
Lead Channel Pacing Threshold Pulse Width: 0.5 ms
Lead Channel Pacing Threshold Pulse Width: 0.5 ms
Lead Channel Pacing Threshold Pulse Width: 0.5 ms
Lead Channel Sensing Intrinsic Amplitude: 5 mV
Lead Channel Sensing Intrinsic Amplitude: 6.7 mV
Lead Channel Setting Pacing Amplitude: 2 V
Lead Channel Setting Pacing Amplitude: 2.5 V
Lead Channel Setting Pacing Pulse Width: 0.5 ms
Lead Channel Setting Sensing Sensitivity: 0.5 mV
Pulse Gen Serial Number: 8956372

## 2024-07-18 NOTE — Patient Instructions (Signed)
 Medication Instructions:   Your physician recommends that you continue on your current medications as directed. Please refer to the Current Medication list given to you today.   *If you need a refill on your cardiac medications before your next appointment, please call your pharmacy*    Lab Work: NONE ORDERED  TODAY    If you have labs (blood work) drawn today and your tests are completely normal, you will receive your results only by: MyChart Message (if you have MyChart) OR A paper copy in the mail If you have any lab test that is abnormal or we need to change your treatment, we will call you to review the results.   Testing/Procedures: NONE ORDERED  TODAY    Follow-Up: At [facility], you and your health needs are our priority.  As part of our continuing mission to provide you with exceptional heart care, our providers are all part of one team.  This team includes your primary Cardiologist (physician) and Advanced Practice Providers or APPs (Physician Assistants and Nurse Practitioners) who all work together to provide you with the care you need, when you need it.  Your next appointment:   DR ACHARYA IN DECEMBER   1 year(s)  Provider:    Charlies Arthur, PA-C    We recommend signing up for the patient portal called MyChart.  Sign up information is provided on this After Visit Summary.  MyChart is used to connect with patients for Virtual Visits (Telemedicine).  Patients are able to view lab/test results, encounter notes, upcoming appointments, etc.  Non-urgent messages can be sent to your provider as well.   To learn more about what you can do with MyChart, go to [email or web address].   Other Instructions

## 2024-07-19 ENCOUNTER — Other Ambulatory Visit

## 2024-07-20 NOTE — Telephone Encounter (Signed)
 Caller Edwardo) is following-up on patient's clearance and wants to clarify when to hold patient's Aspirin .

## 2024-07-20 NOTE — Telephone Encounter (Signed)
 Preop clearance has now be routed/faxed to surgeons office

## 2024-07-20 NOTE — Telephone Encounter (Signed)
 Rosina call from [place] because they have not received clearance back please fax to [phone]

## 2024-07-23 NOTE — Telephone Encounter (Signed)
 I will forward to preop APP to review if the pt has been cleared.   I have reviewed notes from Charlies Arthur, [facility]:  Pre-op Cataract surgery felt to be a low cardiac risk procedure RCRI score is on (0.9% risk) Excellent exertional capacity, very active, exercising regularly defer ASA as per Dr. Loni no particular device management needed for cataract suregy unless cautery is planned  He is not device dependent If planned/need for cautery > place/tape a magnet over his device

## 2024-07-23 NOTE — Telephone Encounter (Signed)
 Caller Edwardo) is following up about when pt should start holding aspirin . Please advise.

## 2024-07-24 NOTE — Telephone Encounter (Signed)
   Patient Name: Matthew Pratt  DOB: 07-Jul-1951 MRN: [id number]  Primary Cardiologist: Soyla DELENA Merck, MD  Chart reviewed as part of pre-operative protocol coverage. Given past medical history and time since last visit, based on ACC/AHA guidelines, Derrico Zhong Abdullah is at acceptable risk for the planned procedure without further cardiovascular testing.   Per Charlies Arthur, PA on 07/18/2024 regarding cardiac clearance: Cataract surgery felt to be a low cardiac risk procedure RCRI score is on (0.9% risk) Excellent exertional capacity, very active, exercising regularly defer ASA as per Dr. Merck no particular device management needed for cataract suregy unless cautery is planned  He is not device dependent If planned/need for cautery > place/tape a magnet over his device  Per Dr. Merck on 07/24/2024 regarding aspirin  hold: If possible continue aspirin  through the perioperative period.  If surgery cannot be safely done on aspirin , ok to hold for 7 days prior to procedure and resume when safe from ophtho perspective.  Most recent cath with no culprit lesion for cardiac arrest.  Holding aspirin  is not zero risk, and this should be communicated to patient by OUR preop team and his agreement of understanding of risk should be obtained prior to providing final recommendations to him.  Tele Visit completed 07/24/2024 by Lum Louis, NP: Patient is doing incredibly well without exertional/anginal symptoms.  We discussed holding of his aspirin  prior to upcoming procedures.  Patient was educated per Dr. Laurence recommendations on holding of his aspirin  and he understands his risk and is in agreement of understanding his risk.  Teach back method was used.   The patient was advised that if he develops new symptoms prior to surgery to contact our office to arrange for a follow-up visit, and he verbalized understanding.  I will route this recommendation to the requesting party via Epic  fax function and remove from pre-op pool.  Please call with questions.  Lum LITTIE Louis, NP 07/24/2024, 12:07 PM

## 2024-07-26 ENCOUNTER — Encounter: Payer: Self-pay | Admitting: Internal Medicine

## 2024-07-30 DIAGNOSIS — H25811 Combined forms of age-related cataract, right eye: Secondary | ICD-10-CM | POA: Diagnosis not present

## 2024-07-30 DIAGNOSIS — H401311 Pigmentary glaucoma, right eye, mild stage: Secondary | ICD-10-CM | POA: Diagnosis not present

## 2024-07-30 DIAGNOSIS — G4733 Obstructive sleep apnea (adult) (pediatric): Secondary | ICD-10-CM | POA: Diagnosis not present

## 2024-08-13 DIAGNOSIS — G4733 Obstructive sleep apnea (adult) (pediatric): Secondary | ICD-10-CM | POA: Diagnosis not present

## 2024-08-13 DIAGNOSIS — H401321 Pigmentary glaucoma, left eye, mild stage: Secondary | ICD-10-CM | POA: Diagnosis not present

## 2024-08-13 DIAGNOSIS — H25812 Combined forms of age-related cataract, left eye: Secondary | ICD-10-CM | POA: Diagnosis not present

## 2024-08-14 ENCOUNTER — Encounter: Payer: Self-pay | Admitting: Family Medicine

## 2024-08-22 ENCOUNTER — Other Ambulatory Visit: Payer: Self-pay

## 2024-08-22 MED ORDER — COVID-19 MRNA VACC (MODERNA) 50 MCG/0.5ML IM SUSY: 0.5000 mL | PREFILLED_SYRINGE | Freq: Once | INTRAMUSCULAR | 0 refills | Status: AC

## 2024-09-11 ENCOUNTER — Telehealth: Payer: Self-pay | Admitting: *Deleted

## 2024-09-11 NOTE — Telephone Encounter (Signed)
Fax not received at this time.

## 2024-09-11 NOTE — Telephone Encounter (Signed)
 Copied from CRM #[id number]. Topic: General - Other >> Sep 11, 2024  8:36 AM Rosina BIRCH wrote: Reason for CRM: Jody from [employer] called wanting to know if the office received a fax from them regarding an open care opportunity for statin therapy. Jody want to know if the patient is taking statin therapy [phone]

## 2024-09-11 NOTE — Progress Notes (Signed)
 Remote ICD Transmission

## 2024-09-18 ENCOUNTER — Telehealth: Admitting: Physician Assistant

## 2024-09-18 DIAGNOSIS — J069 Acute upper respiratory infection, unspecified: Secondary | ICD-10-CM

## 2024-09-18 MED ORDER — BENZONATATE 100 MG PO CAPS: 100.0000 mg | ORAL_CAPSULE | Freq: Three times a day (TID) | ORAL | 0 refills | Status: DC | PRN

## 2024-09-18 NOTE — Progress Notes (Signed)

## 2024-09-19 ENCOUNTER — Ambulatory Visit (INDEPENDENT_AMBULATORY_CARE_PROVIDER_SITE_OTHER): Payer: Medicare Other

## 2024-09-19 DIAGNOSIS — I495 Sick sinus syndrome: Secondary | ICD-10-CM | POA: Diagnosis not present

## 2024-09-20 ENCOUNTER — Ambulatory Visit: Payer: Self-pay | Admitting: Internal Medicine

## 2024-09-20 LAB — CUP PACEART REMOTE DEVICE CHECK
Battery Remaining Longevity: 70 mo
Battery Remaining Percentage: 79 %
Battery Voltage: 2.99 V
Brady Statistic AP VP Percent: 1 %
Brady Statistic AP VS Percent: 68 %
Brady Statistic AS VP Percent: 1 %
Brady Statistic AS VS Percent: 31 %
Brady Statistic RA Percent Paced: 67 %
Brady Statistic RV Percent Paced: 1 %
Date Time Interrogation Session: 20251015040026
HighPow Impedance: 66 Ohm
HighPow Impedance: 66 Ohm
Implantable Lead Connection Status: 753985
Implantable Lead Connection Status: 753985
Implantable Lead Implant Date: 20240115
Implantable Lead Implant Date: 20240115
Implantable Lead Location: 753859
Implantable Lead Location: 753860
Implantable Lead Model: 7122
Implantable Pulse Generator Implant Date: 20240115
Lead Channel Impedance Value: 390 Ohm
Lead Channel Impedance Value: 450 Ohm
Lead Channel Pacing Threshold Amplitude: 0.5 V
Lead Channel Pacing Threshold Amplitude: 0.75 V
Lead Channel Pacing Threshold Pulse Width: 0.5 ms
Lead Channel Pacing Threshold Pulse Width: 0.5 ms
Lead Channel Sensing Intrinsic Amplitude: 4.2 mV
Lead Channel Sensing Intrinsic Amplitude: 6.7 mV
Lead Channel Setting Pacing Amplitude: 2 V
Lead Channel Setting Pacing Amplitude: 2.5 V
Lead Channel Setting Pacing Pulse Width: 0.5 ms
Lead Channel Setting Sensing Sensitivity: 0.5 mV
Pulse Gen Serial Number: 8956372

## 2024-09-24 NOTE — Progress Notes (Signed)
 Remote ICD Transmission

## 2024-09-27 ENCOUNTER — Encounter: Payer: Self-pay | Admitting: Family Medicine

## 2024-09-28 MED ORDER — AMOXICILLIN-POT CLAVULANATE 875-125 MG PO TABS
1.0000 | ORAL_TABLET | Freq: Two times a day (BID) | ORAL | 0 refills | Status: AC
Start: 2024-09-28 — End: 2024-10-05

## 2024-09-28 NOTE — Telephone Encounter (Signed)
Patient informed of the message below and rx sent.

## 2024-10-12 ENCOUNTER — Encounter: Payer: Self-pay | Admitting: Family Medicine

## 2024-10-12 ENCOUNTER — Ambulatory Visit (INDEPENDENT_AMBULATORY_CARE_PROVIDER_SITE_OTHER): Admitting: Family Medicine

## 2024-10-12 VITALS — BP 106/66 | HR 73 | Temp 97.9°F | Wt 203.5 lb

## 2024-10-12 DIAGNOSIS — F339 Major depressive disorder, recurrent, unspecified: Secondary | ICD-10-CM

## 2024-10-12 DIAGNOSIS — K5900 Constipation, unspecified: Secondary | ICD-10-CM

## 2024-10-12 DIAGNOSIS — J209 Acute bronchitis, unspecified: Secondary | ICD-10-CM

## 2024-10-12 NOTE — Progress Notes (Signed)
 Established Patient Office Visit  Subjective   Patient ID: Matthew Pratt, male    DOB: March 17, 1951  Age: 73 y.o. MRN: [id number]  Chief Complaint  Patient presents with   Medical Management of Chronic Issues    HPI   Matthew Pratt has chronic medical problems significant for CAD, history of complete heart block, obstructive sleep apnea, GERD/Barrett's esophagus, hepatic steatosis, hypothyroidism, BPH, recurrent depression, chronic insomnia, hx of sudden cardiac arrest with ICD in place  He came in today concerned predominantly regarding recurrent respiratory infections over the past year.  He states most recent episode started about 4 weeks ago.  He had a bit of lingering cough for few weeks and was concerned regarding the duration of that.  Denied any fever.  No dyspnea.  No chronic sinusitis symptoms.  No recent appetite or weight changes or hemoptysis.  He states he usually gets about 3-4 respiratory infections per year and has questions regarding whether he may have some sort of immune compromise.  He has not had evidence for recurrent bacterial infections such as recurrent bacterial pneumonia, urinary tract, etc.  Other issue is some recent constipation.  He has actually had some issues with loose stools in the past as well but more recently fairly significant constipation.  No anticholinergic medications.  Currently not getting a lot of regular exercise.  Also admittedly does not eat a lot of fiber in his diet.  He has been taking some Senokot without relief recently.  He has history of recurrent depression which has been challenging to get into remission.  Currently on Effexor  XR 150 mg daily.  Still has some depressive symptoms but overall relatively stable.  Past Medical History:  Diagnosis Date   Anxiety    Arthritis    BPH (benign prostatic hypertrophy)    CAD (coronary artery disease)    s/p STEMI 10/15/10 w/ Promus DES placed x1 in the first OM   Colon polyps     Complication of anesthesia    pt. reported 2015 penile inplant when getting some anesthesia had chest pain,they readjusted  the meds. and continued with surgery. Had no further problems di not have to follow up with anyone.   Depression    Depression with anxiety    Dupuytren's contracture of hand    ED (erectile dysfunction)    GERD (gastroesophageal reflux disease)    Glaucoma    HLD (hyperlipidemia)    Hypocontractile bladder    Hypothyroidism    MI (myocardial infarction) (HCC)    denies   OSA on CPAP    Peyronie's disease    Rosacea    Skin cancer    BCC   Past Surgical History:  Procedure Laterality Date   CHOLECYSTECTOMY N/A 02/10/2021   Procedure: LAPAROSCOPIC CHOLECYSTECTOMY;  Surgeon: Vernetta Berg, MD;  Location: [facility] OR;  Service: General;  Laterality: N/A;   COLONOSCOPY WITH ESOPHAGOGASTRODUODENOSCOPY (EGD)     multiple   CORONARY STENT PLACEMENT     ICD IMPLANT N/A 12/20/2022   Procedure: ICD IMPLANT;  Surgeon: Waddell Danelle ORN, MD;  Location: [facility] INVASIVE CV LAB;  Service: Cardiovascular;  Laterality: N/A;   INNER EAR SURGERY     LAPAROSCOPIC CHOLECYSTECTOMY Bilateral 02/09/2021   Dr.blackmon   LASIK  02/1998   LEFT HEART CATH AND CORONARY ANGIOGRAPHY N/A 12/15/2022   Procedure: LEFT HEART CATH AND CORONARY ANGIOGRAPHY;  Surgeon: Anner Alm ORN, MD;  Location: [facility] INVASIVE CV LAB;  Service: Cardiovascular;  Laterality: N/A;   PENILE  PROSTHESIS IMPLANT     TONSILECTOMY, ADENOIDECTOMY, BILATERAL MYRINGOTOMY AND TUBES     TONSILLECTOMY     VASECTOMY     WISDOM TOOTH EXTRACTION      reports that he has never smoked. He has never used smokeless tobacco. He reports current alcohol use. He reports that he does not use drugs. family history includes Arthritis in his paternal grandfather; Basal cell carcinoma in his father and sister; COPD in his mother and another family member; Cancer in his father; Colon cancer in his paternal grandfather and paternal grandmother; Heart  failure in his maternal grandfather; Liver cancer in his father; Melanoma in his sister; Rheum arthritis in his maternal grandmother; Varicose Veins in his father. Allergies  Allergen Reactions   Bee Venom Hives and Swelling   Crestor [Rosuvastatin] Other (See Comments)    Myalgia.     Iodinated Contrast Media Hives and Itching   Sulfa Antibiotics Rash   Sulfonamide Derivatives Rash    Review of Systems  Constitutional:  Negative for chills and fever.  HENT:  Negative for sore throat.   Respiratory:  Positive for cough. Negative for hemoptysis, shortness of breath and wheezing.   Cardiovascular:  Negative for chest pain.  Gastrointestinal:  Positive for constipation. Negative for abdominal pain, nausea and vomiting.      Objective:     BP 106/66   Pulse 73   Temp 97.9 F (36.6 C) (Oral)   Wt 203 lb 8 oz (92.3 kg)   SpO2 98%   BMI 26.85 kg/m  BP Readings from Last 3 Encounters:  10/12/24 106/66  07/18/24 116/70  04/25/24 118/62   Wt Readings from Last 3 Encounters:  10/12/24 203 lb 8 oz (92.3 kg)  07/18/24 198 lb (89.8 kg)  04/25/24 197 lb (89.4 kg)      Physical Exam Vitals reviewed.  Constitutional:      General: He is not in acute distress.    Appearance: He is not ill-appearing.  Cardiovascular:     Rate and Rhythm: Normal rate and regular rhythm.  Pulmonary:     Effort: Pulmonary effort is normal. No respiratory distress.     Breath sounds: Normal breath sounds. No wheezing or rales.  Musculoskeletal:     Cervical back: Neck supple.     Right lower leg: No edema.     Left lower leg: No edema.  Lymphadenopathy:     Cervical: No cervical adenopathy.  Neurological:     Mental Status: He is alert.      No results found for any visits on 10/12/24.    The ASCVD Risk score (Arnett DK, et al., 2019) failed to calculate for the following reasons:   Risk score cannot be calculated because patient has a medical history suggesting prior/existing  ASCVD    Assessment & Plan:   #1 patient expressing concern regarding recurrent respiratory infections.  He states on average he gets about 3-4 infections per year.  He has not had evidence for recurrent bacterial infections and we explained that about 3 to 4/year viral infections is actually not very atypical.  We did discuss basic things to help immune function such as adequate sleep, well-rounded diet, adequate hydration, good handwashing, etc. flu vaccine already given.  He is also had RSV and pneumonia vaccine is up-to-date.  #2 constipation.  Patient has been on Senokot without much improvement.  We suggested trial of MiraLAX  17 g up to twice daily if necessary and then back off as stools become  more regular.  Also discussed basics of adequate hydration, goal of at least 30 g fiber per day, regular walking/exercise  #3 recurrent depression currently stable Effexor  XR 150 mg daily  Wolm Scarlet, MD

## 2024-10-12 NOTE — Patient Instructions (Signed)
Consider Miralax as needed for constipation.

## 2024-10-13 ENCOUNTER — Encounter: Payer: Self-pay | Admitting: Family Medicine

## 2024-10-15 MED ORDER — NITROGLYCERIN 0.4 MG SL SUBL: 0.4000 mg | SUBLINGUAL_TABLET | SUBLINGUAL | 3 refills | Status: AC | PRN

## 2024-11-13 ENCOUNTER — Ambulatory Visit: Attending: Internal Medicine | Admitting: Internal Medicine

## 2024-11-13 ENCOUNTER — Encounter: Payer: Self-pay | Admitting: Internal Medicine

## 2024-11-13 VITALS — BP 108/65 | HR 60 | Ht 73.0 in | Wt 209.0 lb

## 2024-11-13 DIAGNOSIS — I442 Atrioventricular block, complete: Secondary | ICD-10-CM

## 2024-11-13 DIAGNOSIS — I469 Cardiac arrest, cause unspecified: Secondary | ICD-10-CM

## 2024-11-13 DIAGNOSIS — I2510 Atherosclerotic heart disease of native coronary artery without angina pectoris: Secondary | ICD-10-CM

## 2024-11-13 DIAGNOSIS — Z95810 Presence of automatic (implantable) cardiac defibrillator: Secondary | ICD-10-CM

## 2024-11-13 DIAGNOSIS — G4733 Obstructive sleep apnea (adult) (pediatric): Secondary | ICD-10-CM

## 2024-11-13 DIAGNOSIS — E785 Hyperlipidemia, unspecified: Secondary | ICD-10-CM

## 2024-11-13 NOTE — Progress Notes (Signed)
 Cardiology Office Note:  .   Date:  11/13/2024  ID:  Matthew Pratt, DOB 08/31/1951, MRN [id number] PCP: Micheal Wolm ORN, MD  [facility] Providers Cardiologist:  Soyla DELENA Merck, MD    History of Present Illness: .   Matthew Pratt is a 73 y.o. male.  Discussed the use of AI scribe software for clinical note transcription with the patient, who gave verbal consent to proceed.  History of Present Illness Matthew Pratt is a 73 year old male with coronary artery disease who presents for a cardiovascular follow-up.  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 He has had no recent cardiac symptoms or device shocks. He is specifically concerned about ICD battery life.  He stopped using CPAP during an illness in October and has since felt more fatigued. After resuming CPAP he feels less tired and notes better mood.  He has tried multiple lipid-lowering therapies including statins, ezetimibe , Repatha , and Leqvio , but developed pain and fatigue with each and stopped them. He is not taking any cholesterol medication and is managing lipids with diet alone.  He has depression treated with 150 mg of medication, increased about a month ago, with improvement since restarting CPAP.  He lives alone and prepares his own meals, focusing on a heart-healthy diet and exercise. He has a family history of heart disease and cancer, including a sister who died of cancer at 67.    ROS: negative except per HPI above.  Studies Reviewed: SABRA       Cath 12/15/22   Prox LAD to Mid LAD lesion is 55% stenosed.  Dist LAD lesion is 45% stenosed.   Prox Cx to Mid Cx lesion is 55% stenosed.   1st Mrg (previously placed Drug Eluting stent is 100% stenosed -> CTO with left to left and right left lateral   Prox RCA lesion is 60% stenosed. Mid RCA lesion is 55% stenosed.   LV end diastolic pressure is low.   There is no aortic valve stenosis.   POST-CATH DIAGNOSES Diffuse moderate  to severe disease with calcification in all the proximal vessels, and likely CTO of previous OM1 stent filling via collaterals from both left to right and right to left. No obvious culprit lesion to explain cardiac arrest. Low LVEDP of 6 mmHg. A-line pressures were in the high 80s to low 90s compared to cuff . Hypokalemia noted on labs-potassium 2.9.  Was started on IV potassium 10 mg once     RECOMMENDATION Monitor clinically for signs of recovery per PCCM. I suspect regional wall motion abnormality on echo was related to the CTO of the OM. Results LABS Creatinine: normal (04/2023)  DIAGNOSTIC Cardiac catheterization: moderate to severe coronary artery blockages ECG: normal (07/2024) Risk Assessment/Calculations:       Physical Exam:   VS:  BP 108/65 (BP Location: Left Arm, Patient Position: Sitting, Cuff Size: Normal)   Pulse 60   Ht 6' 1 (1.854 m)   Wt 209 lb (94.8 kg)   SpO2 95%   BMI 27.57 kg/m    Wt Readings from Last 3 Encounters:  11/13/24 209 lb (94.8 kg)  10/12/24 203 lb 8 oz (92.3 kg)  07/18/24 198 lb (89.8 kg)     Physical Exam GENERAL: Alert, cooperative, well developed, no acute distress HEENT: Normocephalic, normal oropharynx, moist mucous membranes CHEST: Clear to auscultation bilaterally, No wheezes, rhonchi, or crackles CARDIOVASCULAR: Normal heart rate and rhythm, S1 and S2 normal without murmurs  ABDOMEN: Soft, non-tender, non-distended, without organomegaly, Normal bowel sounds EXTREMITIES: No cyanosis or edema NEUROLOGICAL: Cranial nerves grossly intact, Moves all extremities without gross motor or sensory deficit   ASSESSMENT AND PLAN: .    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 - Continue remote interrogations and clinic follow-ups with electrophysiology in one year. - Monitor ICD battery life  and plan for generator change as needed.  Coronary artery disease Severe CAD, cholesterol management suboptimal due to medication intolerance. - Focus on dietary modifications to manage cholesterol, emphasizing nutrient-dense, high-protein, and low-fat foods. - Avoid ketogenic diet due to potential increase in cholesterol levels. - continue ASA 81 mg daily - not on statin, see below  Hyperlipidemia Cholesterol levels not controlled due to intolerance to statins and other medications. Dietary management emphasized. - Emphasized dietary management to improve lipid profile, focusing on high-protein, low-fat, and nutrient-dense foods.  Obesity Weight gain due to lifestyle factors, previous weight loss with ketogenic diet but concerns about cholesterol. - Focus on dietary changes emphasizing high-protein, low-fat, and nutrient-dense foods. - Encouraged regular exercise, including home-based activities and strength training.  Obstructive sleep apnea Resumed CPAP use improved symptoms, reduced fatigue and depression. - Continue CPAP therapy to manage obstructive sleep apnea.  Recording duration: 25 minutes      Shaneca Orne, MD, FACC

## 2024-11-13 NOTE — Progress Notes (Deleted)
  Cardiology Office Note:  .   Date:  11/13/2024  ID:  Matthew Pratt, DOB February 10, 1951, MRN [id number] PCP: Micheal Wolm ORN, MD  [facility] Providers Cardiologist:  Soyla DELENA Merck, MD    History of Present Illness: .   Matthew Pratt is a 73 y.o. male.  Discussed the use of AI scribe software for clinical note transcription with the patient, who gave verbal consent to proceed.  History of Present Illness     ROS: negative except per HPI above.  Studies Reviewed: .        Results  Risk Assessment/Calculations:   {Does this patient have ATRIAL FIBRILLATION?:[id number]}   Physical Exam:   VS:  BP 108/65 (BP Location: Left Arm, Patient Position: Sitting, Cuff Size: Normal)   Pulse 60   Ht 6' 1 (1.854 m)   Wt 209 lb (94.8 kg)   SpO2 95%   BMI 27.57 kg/m    Wt Readings from Last 3 Encounters:  11/13/24 209 lb (94.8 kg)  10/12/24 203 lb 8 oz (92.3 kg)  07/18/24 198 lb (89.8 kg)     Physical Exam    ASSESSMENT AND PLAN: .    Assessment and Plan Assessment & Plan       Soyla Merck, MD, FACC

## 2024-11-13 NOTE — Patient Instructions (Signed)
 Medication Instructions:  No Changes *If you need a refill on your cardiac medications before your next appointment, please call your pharmacy*  Follow-Up: At [facility], you and your health needs are our priority.  As part of our continuing mission to provide you with exceptional heart care, our providers are all part of one team.  This team includes your primary Cardiologist (physician) and Advanced Practice Providers or APPs (Physician Assistants and Nurse Practitioners) who all work together to provide you with the care you need, when you need it.  Your next appointment:   1 year(s)  Provider:   Gayatri A Acharya, MD   Other Instructions Please call us  or send a MyChart message with any Cardiology related questions/concerns.  [phone].  Thank you!

## 2024-11-21 ENCOUNTER — Encounter (HOSPITAL_BASED_OUTPATIENT_CLINIC_OR_DEPARTMENT_OTHER): Payer: Self-pay | Admitting: *Deleted

## 2024-11-21 ENCOUNTER — Emergency Department (HOSPITAL_BASED_OUTPATIENT_CLINIC_OR_DEPARTMENT_OTHER)
Admission: EM | Admit: 2024-11-21 | Discharge: 2024-11-22 | Disposition: A | Discharge status: Home / Self Care | Source: Home / Self Care | Attending: Emergency Medicine | Admitting: Emergency Medicine

## 2024-11-21 ENCOUNTER — Other Ambulatory Visit: Payer: Self-pay

## 2024-11-21 DIAGNOSIS — Z9889 Other specified postprocedural states: Secondary | ICD-10-CM | POA: Insufficient documentation

## 2024-11-21 DIAGNOSIS — R197 Diarrhea, unspecified: Secondary | ICD-10-CM | POA: Insufficient documentation

## 2024-11-21 DIAGNOSIS — K760 Fatty (change of) liver, not elsewhere classified: Secondary | ICD-10-CM | POA: Diagnosis not present

## 2024-11-21 DIAGNOSIS — Z7982 Long term (current) use of aspirin: Secondary | ICD-10-CM | POA: Insufficient documentation

## 2024-11-21 DIAGNOSIS — K5730 Diverticulosis of large intestine without perforation or abscess without bleeding: Secondary | ICD-10-CM | POA: Insufficient documentation

## 2024-11-21 DIAGNOSIS — R7989 Other specified abnormal findings of blood chemistry: Secondary | ICD-10-CM | POA: Diagnosis not present

## 2024-11-21 DIAGNOSIS — I2510 Atherosclerotic heart disease of native coronary artery without angina pectoris: Secondary | ICD-10-CM | POA: Insufficient documentation

## 2024-11-21 DIAGNOSIS — R160 Hepatomegaly, not elsewhere classified: Secondary | ICD-10-CM | POA: Diagnosis not present

## 2024-11-21 DIAGNOSIS — R140 Abdominal distension (gaseous): Secondary | ICD-10-CM | POA: Insufficient documentation

## 2024-11-21 DIAGNOSIS — Z79899 Other long term (current) drug therapy: Secondary | ICD-10-CM | POA: Diagnosis not present

## 2024-11-21 DIAGNOSIS — E785 Hyperlipidemia, unspecified: Secondary | ICD-10-CM | POA: Diagnosis present

## 2024-11-21 LAB — URINALYSIS, ROUTINE W REFLEX MICROSCOPIC
Bacteria, UA: NONE SEEN
Bilirubin Urine: NEGATIVE
Glucose, UA: NEGATIVE mg/dL
Hgb urine dipstick: NEGATIVE
Ketones, ur: NEGATIVE mg/dL
Leukocytes,Ua: NEGATIVE
Nitrite: NEGATIVE
Specific Gravity, Urine: 1.02 (ref 1.005–1.030)
pH: 7.5 (ref 5.0–8.0)

## 2024-11-21 LAB — COMPREHENSIVE METABOLIC PANEL WITH GFR
ALT: 341 U/L — ABNORMAL HIGH (ref 0–44)
AST: 418 U/L — ABNORMAL HIGH (ref 15–41)
Albumin: 4.3 g/dL (ref 3.5–5.0)
Alkaline Phosphatase: 254 U/L — ABNORMAL HIGH (ref 38–126)
Anion gap: 11 (ref 5–15)
BUN: 20 mg/dL (ref 8–23)
CO2: 26 mmol/L (ref 22–32)
Calcium: 9.4 mg/dL (ref 8.9–10.3)
Chloride: 100 mmol/L (ref 98–111)
Creatinine, Ser: 1.32 mg/dL — ABNORMAL HIGH (ref 0.61–1.24)
GFR, Estimated: 56 mL/min — ABNORMAL LOW (ref >60)
Glucose, Bld: 156 mg/dL — ABNORMAL HIGH (ref 70–99)
Potassium: 4 mmol/L (ref 3.5–5.1)
Sodium: 137 mmol/L (ref 135–145)
Total Bilirubin: 1.9 mg/dL — ABNORMAL HIGH (ref 0.0–1.2)
Total Protein: 7.3 g/dL (ref 6.5–8.1)

## 2024-11-21 LAB — CBC
HCT: 40.8 % (ref 39.0–52.0)
Hemoglobin: 14 g/dL (ref 13.0–17.0)
MCH: 35 pg — ABNORMAL HIGH (ref 26.0–34.0)
MCHC: 34.3 g/dL (ref 30.0–36.0)
MCV: 102 fL — ABNORMAL HIGH (ref 80.0–100.0)
Platelets: 202 K/uL (ref 150–400)
RBC: 4 MIL/uL — ABNORMAL LOW (ref 4.22–5.81)
RDW: 12.2 % (ref 11.5–15.5)
WBC: 6 K/uL (ref 4.0–10.5)
nRBC: 0 % (ref 0.0–0.2)

## 2024-11-21 LAB — LIPASE, BLOOD: Lipase: 51 U/L (ref 11–51)

## 2024-11-21 MED ORDER — METHYLPREDNISOLONE SODIUM SUCC 40 MG IJ SOLR
40.0000 mg | Freq: Once | INTRAMUSCULAR | Status: AC
  Administered 2024-11-21: 23:00:00 40 mg via INTRAVENOUS
  Filled 2024-11-21: qty 1

## 2024-11-21 MED ORDER — DIPHENHYDRAMINE HCL 25 MG PO CAPS: 50.0000 mg | ORAL_CAPSULE | Freq: Once | ORAL | Status: AC

## 2024-11-21 MED ORDER — ONDANSETRON 4 MG PO TBDP
4.0000 mg | ORAL_TABLET | Freq: Once | ORAL | Status: AC | PRN
  Administered 2024-11-21: 23:00:00 4 mg via ORAL
  Filled 2024-11-21: qty 1

## 2024-11-21 MED ORDER — LACTATED RINGERS IV BOLUS
1000.0000 mL | Freq: Once | INTRAVENOUS | Status: AC
  Administered 2024-11-21: 1000 mL via INTRAVENOUS

## 2024-11-21 MED ORDER — DIPHENHYDRAMINE HCL 50 MG/ML IJ SOLN
50.0000 mg | Freq: Once | INTRAMUSCULAR | Status: AC
  Administered 2024-11-22: 02:00:00 50 mg via INTRAVENOUS
  Filled 2024-11-21: qty 1

## 2024-11-21 NOTE — ED Triage Notes (Signed)
 Pt arrives by [facility] due to nausea and vomiting x1 week.  Diffuse mild abdominal pain, pt had diarrhea when this first began.  No GU symptoms.

## 2024-11-21 NOTE — ED Provider Notes (Signed)
 [facility] EMERGENCY DEPARTMENT AT [facility] Provider Note   CSN: [id number] Arrival date & time: 11/21/24  2041     Patient presents with: Nausea and Emesis   Matthew Pratt is a 73 y.o. male.  {Add pertinent medical, surgical, social history, OB history to HPI:32947} HPI     73 year old male with history of CAD, hyperlipidemia, cholecystectomy comes in with chief complaint of abdominal discomfort  Patient reports that he had some leftover turkey from Thanksgiving.  He was going through the leftover food and on Thursday, week ago he started developing severe diarrhea.  Patient had profound diarrhea for 2 days.  He had associated rigors.  He just stayed home and tried his best to hydrate.  Over the weekend, he started feeling better, but on Monday he started having diarrhea like symptoms again.  Patient however now has stopped having bowel movement.  His last bowel movement was 2 days ago.  Patient denies any blood in the stool.  However patient is now having nausea, feeling bloated and does not think that he is passing gas.    Prior to Admission medications  Medication Sig Start Date End Date Taking? Authorizing Provider  acetaminophen  (TYLENOL ) 500 MG tablet Take 500 mg by mouth every 6 (six) hours as needed for mild pain.    [provider]  aspirin  EC 81 MG tablet Take 81 mg by mouth every morning.    [provider]  Calcium  Carb-Cholecalciferol  (OYSTER SHELL CALCIUM  W/D) 500-5 MG-MCG TABS Take by mouth daily in the afternoon. 07/15/22   [provider]  Cholecalciferol  (VITAMIN D3) 50 MCG (2000 UT) CAPS Take by mouth.    [provider]  EPINEPHrine  0.3 mg/0.3 mL IJ SOAJ injection Inject 0.3 mg into the muscle as needed. 12/28/23   Burchette, Wolm ORN, MD  Folic Acid (FOLATE PO) Take 800 mcg by mouth daily at 2 PM.    [provider]  nitroGLYCERIN  (NITROSTAT ) 0.4 MG SL tablet Place 1 tablet (0.4 mg total) under the tongue  every 5 (five) minutes as needed for chest pain. 10/15/24 01/13/25  Burchette, Wolm ORN, MD  pantoprazole  (PROTONIX ) 40 MG tablet TAKE 1 TABLET BY MOUTH EVERY DAY 06/06/24   Burchette, Wolm ORN, MD  senna (SENOKOT) 8.6 MG tablet Take 1 tablet by mouth as needed for constipation.    [provider]  SYNTHROID  137 MCG tablet Take 137 mcg by mouth daily before breakfast. 02/18/23   [provider]  TURMERIC PO Take 1,500 mg by mouth daily.    [provider]  venlafaxine  XR (EFFEXOR  XR) 150 MG 24 hr capsule Take 1 capsule (150 mg total) by mouth daily with breakfast. 01/27/24   Burchette, Wolm ORN, MD  vitamin B-12 (CYANOCOBALAMIN ) 500 MCG tablet Take 500 mcg by mouth daily.    [provider]  zinc  gluconate 50 MG tablet Take 50 mg by mouth daily at 6 PM.    [provider]    Allergies: Bee venom, Crestor [rosuvastatin], Iodinated contrast media, Sulfa antibiotics, and Sulfonamide derivatives    Review of Systems  All other systems reviewed and are negative.   Updated Vital Signs BP 117/73   Pulse (!) 103   Temp 99.7 F (37.6 C) (Oral)   Resp 19   SpO2 96%   Physical Exam Vitals and nursing note reviewed.  Constitutional:      Appearance: He is well-developed.  HENT:     Head: Atraumatic.  Eyes:  Extraocular Movements: Extraocular movements intact.     Pupils: Pupils are equal, round, and reactive to light.  Cardiovascular:     Rate and Rhythm: Normal rate.  Pulmonary:     Effort: Pulmonary effort is normal.  Abdominal:     General: There is distension.     Tenderness: There is abdominal tenderness. There is no guarding or rebound.  Musculoskeletal:     Cervical back: Neck supple.  Skin:    General: Skin is warm.  Neurological:     Mental Status: He is alert and oriented to person, place, and time.     (all labs ordered are listed, but only abnormal results are displayed) Labs Reviewed  COMPREHENSIVE METABOLIC PANEL WITH GFR -  Abnormal; Notable for the following components:      Result Value   Glucose, Bld 156 (*)    Creatinine, Ser 1.32 (*)    AST 418 (*)    ALT 341 (*)    Alkaline Phosphatase 254 (*)    Total Bilirubin 1.9 (*)    GFR, Estimated 56 (*)    All other components within normal limits  CBC - Abnormal; Notable for the following components:   RBC 4.00 (*)    MCV 102.0 (*)    MCH 35.0 (*)    All other components within normal limits  URINALYSIS, ROUTINE W REFLEX MICROSCOPIC - Abnormal; Notable for the following components:   APPearance HAZY (*)    Protein, ur TRACE (*)    All other components within normal limits  LIPASE, BLOOD    EKG: None  Radiology: No results found.  {Document cardiac monitor, telemetry assessment procedure when appropriate:32947} Procedures   Medications Ordered in the ED  diphenhydrAMINE  (BENADRYL ) capsule 50 mg (has no administration in time range)    Or  diphenhydrAMINE  (BENADRYL ) injection 50 mg (has no administration in time range)  lactated ringers  bolus 1,000 mL (has no administration in time range)  ondansetron  (ZOFRAN -ODT) disintegrating tablet 4 mg (4 mg Oral Given 11/21/24 2247)  methylPREDNISolone  sodium succinate (SOLU-MEDROL ) 40 mg/mL injection 40 mg (40 mg Intravenous Given 11/21/24 2246)      {Click here for ABCD2, HEART and other calculators REFRESH Note before signing:1}                              Medical Decision Making Amount and/or Complexity of Data Reviewed Labs: ordered. Radiology: ordered.  Risk Prescription drug management.   73 year old patient comes in with cc of Abdominal pain. Pertinent past medical includes CAD, cholecystectomy. Collateral history provided by reviewing patient's records including discharge summary, outpatient cardiology notes and previous CT angiogram from 12-2022 of the abdomen.  Differential diagnosis considered for this patient includes: Pancreatitis, Hepatobiliary pathology including  choledocholithiasis, gastritis/peptic ulcer disease, small bowel obstruction, Acute coronary syndrome, Aortic Dissection  Although I considered colitis in the differential diagnosis, patient no longer is having diarrhea.  He still could be having gastroenteritis.  Severe electrolyte abnormality, AKI also possible.  Basic labs ordered, it was noted that patient has elevated LFTs.  He reports that he did have liver injury in the past because of Tylenol  toxicity.  However, he is not taking any Tylenol  at this time.  We will get Tylenol  level.  CT scan of the abdomen and pelvis has been ordered to get a closer look at the liver and to make sure there is no mass or tumor or any signs of  common bile duct dilatation.  Small bowel obstruction is another possibility.  At this time, patient is stable.  Final diagnoses:  None    ED Discharge Orders     None

## 2024-11-22 ENCOUNTER — Emergency Department (HOSPITAL_BASED_OUTPATIENT_CLINIC_OR_DEPARTMENT_OTHER)

## 2024-11-22 LAB — ACETAMINOPHEN LEVEL: Acetaminophen (Tylenol), Serum: <10 ug/mL — ABNORMAL LOW (ref 10–30)

## 2024-11-22 MED ORDER — IOHEXOL 300 MG/ML  SOLN
100.0000 mL | Freq: Once | INTRAMUSCULAR | Status: AC | PRN
  Administered 2024-11-22: 03:00:00 100 mL via INTRAVENOUS

## 2024-11-22 NOTE — ED Provider Notes (Signed)
°  Physical Exam  BP 117/67   Pulse 77   Temp 99.7 F (37.6 C) (Oral)   Resp 19   SpO2 93%   Physical Exam Vitals and nursing note reviewed.  Constitutional:      Appearance: Normal appearance.  HENT:     Head: Normocephalic.  Pulmonary:     Effort: Pulmonary effort is normal.  Skin:    General: Skin is warm and dry.  Neurological:     Mental Status: He is alert and oriented to person, place, and time.     Procedures  Procedures  ED Course / MDM    Medical Decision Making Amount and/or Complexity of Data Reviewed Labs: ordered. Radiology: ordered.  Risk Prescription drug management.   Care assumed from Dr. Charlyn at shift change.  Patient awaiting results of a CT scan that was obtained to evaluate for elevated LFTs in the setting of recent diarrheal illness.  CT scan has resulted and shows no acute intra-abdominal process.  There is no evidence for choledocholithiasis or other issue.  At this point, I feel as though patient can safely be discharged.  Perhaps he experienced a bout of hepatitis A and this caused a transient elevation of his liver functions.  I will have him follow-up with his primary doctor in 2 days for a recheck of his LFTs.  To return as needed for any problems.       Geroldine Berg, MD 11/22/24 [phone]

## 2024-11-22 NOTE — ED Notes (Signed)
 Taxi voucher provided by night shift, diplomatic services operational officer advised pt left in taxi without voucher.

## 2024-11-22 NOTE — Discharge Instructions (Signed)
 Follow-up with your doctor on Friday for a recheck of your liver function testing.  Return to the ER if you develop any new and/or concerning issues.

## 2024-11-26 NOTE — Telephone Encounter (Signed)
 Called and spoke to pt. Gave him Dr. Laurence advice on holding off on statin right now. He does have an appt with PCP on 11/28/24 for f/u and he plans to hopefully have labs drawn there to re evaluate liver function. He would like to start medication for cholesterol that will not affect liver. He has seen Pharm D Shari, Kristin) in the past (Jan 2024) and he is asking if possibly he could see them again. This was the plan at that office visit:   Plan: Patient agreeable to starting the process of getting inclisiran (Leqvio ) covered by insurance  If insurance does not cover Leqvio  or if cost is too much, patient is willing to try Repatha  again Repeat cholesterol labs 2-3 months after cholesterol medication is started Follow up with Dr. Loni at next cardiology visit - [place], Kristin L, RPH-CPP (Pharmacist)    (Copy and pasted from Office visit note 01/04/2023)   I will call pt early morning of 11/30/24 so he can give us  update from PCP and also to give any further advice on behalf of Dr. Loni.

## 2024-11-28 ENCOUNTER — Ambulatory Visit: Admitting: Family Medicine

## 2024-11-28 ENCOUNTER — Encounter: Payer: Self-pay | Admitting: Family Medicine

## 2024-11-28 VITALS — BP 122/82 | HR 71 | Temp 97.7°F | Wt 203.4 lb

## 2024-11-28 DIAGNOSIS — R739 Hyperglycemia, unspecified: Secondary | ICD-10-CM

## 2024-11-28 DIAGNOSIS — R197 Diarrhea, unspecified: Secondary | ICD-10-CM

## 2024-11-28 DIAGNOSIS — R7401 Elevation of levels of liver transaminase levels: Secondary | ICD-10-CM | POA: Diagnosis not present

## 2024-11-28 NOTE — Patient Instructions (Signed)
 Follow up for any recurrent diarrhea, abdominal pain, or fever

## 2024-11-28 NOTE — Progress Notes (Signed)
 "  Established Patient Office Visit  Subjective   Patient ID: Matthew Pratt, male    DOB: 1951/06/03  Age: 73 y.o. MRN: [id number]  Chief Complaint  Patient presents with   Hospitalization Follow-up    Diarrhea     HPI    Matthew Pratt is seen for ER follow-up.  His chronic medical history is significant for CAD, history of sudden cardiac arrest, history of complete heart block, obstructive sleep apnea, GERD and Barrett's esophagus, hepatic steatosis, hypothyroidism, BPH, history of recurrent depression.  He relates onset around 10 December of some diarrhea symptoms.  He felt like the symptoms peaked on the 12th and then he had some gradual improvement with some formed stools but then relapse again around the 16th or 17th.  When he relapsed with vomiting and diarrhea on the 17th went to the ER for further evaluation.  Was taken in by EMS.  Patient was concerned he may have had some Salmonella.  He had eaten some deviled eggs that someone had brought him prior to onset of symptoms No recent travels or antibiotics.  He was not aware of any fever.  No bloody stools.  Had some upper abdominal pain transiently but none now.  Evaluation in the ER reviewed.  Creatinine slightly elevated 1.32.  Lipase normal.  Liver enzymes were elevated with AST 418, ALT 341, alk phosphatase 254.  Patient also mentioned glucose 156 but he thinks he had a IV in place at that time and possibly receiving IV fluids.  He does drink some alcohol but not regularly and no history of binge drinking.  CT abdomen pelvis revealed mild dilation of the biliary tree felt likely to be postcholecystectomy change and unchanged from prior exam 12-15-2022.  There was also comment of stable moderate hepatic steatosis and mild hepatomegaly.  Mild diverticulosis without diverticulitis.  Comment of extensive coronary artery calcifications.  At this time his stools are back to normal.  No abdominal pain.  Good appetite.  No nausea or  vomiting.  Past Medical History:  Diagnosis Date   Anxiety    Arthritis    BPH (benign prostatic hypertrophy)    CAD (coronary artery disease)    s/p STEMI 10/15/10 w/ Promus DES placed x1 in the first OM   Colon polyps    Complication of anesthesia    pt. reported 2015 penile inplant when getting some anesthesia had chest pain,they readjusted  the meds. and continued with surgery. Had no further problems di not have to follow up with anyone.   Depression    Depression with anxiety    Dupuytren's contracture of hand    ED (erectile dysfunction)    GERD (gastroesophageal reflux disease)    Glaucoma    HLD (hyperlipidemia)    Hypocontractile bladder    Hypothyroidism    MI (myocardial infarction) (HCC)    denies   OSA on CPAP    Peyronie's disease    Rosacea    Skin cancer    BCC   Past Surgical History:  Procedure Laterality Date   CHOLECYSTECTOMY N/A 02/10/2021   Procedure: LAPAROSCOPIC CHOLECYSTECTOMY;  Surgeon: Vernetta Berg, MD;  Location: [facility] OR;  Service: General;  Laterality: N/A;   COLONOSCOPY WITH ESOPHAGOGASTRODUODENOSCOPY (EGD)     multiple   CORONARY STENT PLACEMENT     ICD IMPLANT N/A 12/20/2022   Procedure: ICD IMPLANT;  Surgeon: Waddell Danelle ORN, MD;  Location: [facility] INVASIVE CV LAB;  Service: Cardiovascular;  Laterality: N/A;   INNER EAR SURGERY  LAPAROSCOPIC CHOLECYSTECTOMY Bilateral 02/09/2021   Dr.blackmon   LASIK  02/1998   LEFT HEART CATH AND CORONARY ANGIOGRAPHY N/A 12/15/2022   Procedure: LEFT HEART CATH AND CORONARY ANGIOGRAPHY;  Surgeon: Anner Alm ORN, MD;  Location: [facility] INVASIVE CV LAB;  Service: Cardiovascular;  Laterality: N/A;   PENILE PROSTHESIS IMPLANT     TONSILECTOMY, ADENOIDECTOMY, BILATERAL MYRINGOTOMY AND TUBES     TONSILLECTOMY     VASECTOMY     WISDOM TOOTH EXTRACTION      reports that he has never smoked. He has never used smokeless tobacco. He reports current alcohol use. He reports that he does not use drugs. family history  includes Arthritis in his paternal grandfather; Basal cell carcinoma in his father and sister; COPD in his mother and another family member; Cancer in his father; Colon cancer in his paternal grandfather and paternal grandmother; Heart failure in his maternal grandfather; Liver cancer in his father; Melanoma in his sister; Rheum arthritis in his maternal grandmother; Varicose Veins in his father. Allergies[1]  Review of Systems  Constitutional:  Negative for chills and fever.  Respiratory:  Negative for shortness of breath.   Cardiovascular:  Negative for chest pain.  Gastrointestinal:  Negative for abdominal pain, blood in stool, constipation, diarrhea, melena, nausea and vomiting.  Genitourinary:  Negative for dysuria.      Objective:     BP 122/82   Pulse 71   Temp 97.7 F (36.5 C)   Wt 203 lb 6.4 oz (92.3 kg)   SpO2 97%   BMI 26.84 kg/m  BP Readings from Last 3 Encounters:  11/28/24 122/82  11/22/24 102/72  11/13/24 108/65   Wt Readings from Last 3 Encounters:  11/28/24 203 lb 6.4 oz (92.3 kg)  11/13/24 209 lb (94.8 kg)  10/12/24 203 lb 8 oz (92.3 kg)      Physical Exam Vitals reviewed.  Constitutional:      General: He is not in acute distress.    Appearance: Normal appearance. He is not ill-appearing.  Cardiovascular:     Rate and Rhythm: Normal rate and regular rhythm.  Pulmonary:     Effort: Pulmonary effort is normal.     Breath sounds: Normal breath sounds. No wheezing or rales.  Abdominal:     General: There is no distension.     Palpations: Abdomen is soft.     Tenderness: There is no abdominal tenderness. There is no guarding or rebound.  Neurological:     Mental Status: He is alert.      No results found for any visits on 11/28/24.  Last CBC Lab Results  Component Value Date   WBC 6.0 11/21/2024   HGB 14.0 11/21/2024   HCT 40.8 11/21/2024   MCV 102.0 (H) 11/21/2024   MCH 35.0 (H) 11/21/2024   RDW 12.2 11/21/2024   PLT 202 11/21/2024    Last metabolic panel Lab Results  Component Value Date   GLUCOSE 156 (H) 11/21/2024   NA 137 11/21/2024   K 4.0 11/21/2024   CL 100 11/21/2024   CO2 26 11/21/2024   BUN 20 11/21/2024   CREATININE 1.32 (H) 11/21/2024   GFRNONAA 56 (L) 11/21/2024   CALCIUM  9.4 11/21/2024   PHOS 2.3 (L) 12/17/2022   PROT 7.3 11/21/2024   ALBUMIN 4.3 11/21/2024   BILITOT 1.9 (H) 11/21/2024   ALKPHOS 254 (H) 11/21/2024   AST 418 (H) 11/21/2024   ALT 341 (H) 11/21/2024   ANIONGAP 11 11/21/2024   Last thyroid  functions Lab  Results  Component Value Date   TSH 3.82 02/22/2022   FREET4 1.25 12/15/2021      The ASCVD Risk score (Arnett DK, et al., 2019) failed to calculate for the following reasons:   Risk score cannot be calculated because patient has a medical history suggesting prior/existing ASCVD   * - Cholesterol units were assumed    Assessment & Plan:   #1 recent diarrhea symptoms which have now resolved.  Etiology unclear.  We explained that bacterial illness such as Salmonella would be fairly unlikely without any fever or bloody stools.  Diarrhea may have been related to viral origin.  In any event, symptoms have resolved at this time.  #2 elevated liver transaminases.  Query related to #1.  Recent CT scan showed no acute findings to explain his transaminase elevations.  No obvious biliary duct dilatation other than what is considered normal for postsurgical cholecystectomy.  No abdominal pain.  Previous cholecystectomy.  Recheck CMP today.  Consider further evaluation if enzymes not coming back down.  Follow-up for any recurrent abdominal pain, nausea, vomiting.  He does have history of underlying hepatic steatosis which we explained would not likely spike his enzymes this high.  Did discuss importance of long-term management with reduction in weight and visceral fat  #3 recent mild hyperglycemia during ER visit.  This was nonfasting and possibly receiving IV fluids.  Recheck A1c   No  follow-ups on file.    Wolm Scarlet, MD     [1]  Allergies Allergen Reactions   Bee Venom Hives and Swelling   Crestor [Rosuvastatin] Other (See Comments)    Myalgia.     Iodinated Contrast Media Hives and Itching   Sulfa Antibiotics Rash   Sulfonamide Derivatives Rash   "

## 2024-11-30 ENCOUNTER — Other Ambulatory Visit

## 2024-11-30 DIAGNOSIS — R7401 Elevation of levels of liver transaminase levels: Secondary | ICD-10-CM

## 2024-11-30 DIAGNOSIS — R739 Hyperglycemia, unspecified: Secondary | ICD-10-CM

## 2024-11-30 NOTE — Telephone Encounter (Signed)
 Called and spoke to pt. After seeing his PCP on 11/28/24, he has decided that he will not pursue further PharmD/Lipids Clinic visits at [facility]. He states that he has tried PCSK9i and also Statins and they have not worked for him. He is planning on a Keto diet approach for the new year and will plan to f/u with Dr. Loni in about one year. He has a goal of losing weight, but does not want to use any medications. He would like to modify his diet/lifestyle to lose the weight. No other concerns for now.

## 2024-12-02 ENCOUNTER — Ambulatory Visit: Payer: Self-pay | Admitting: Family Medicine

## 2024-12-02 ENCOUNTER — Other Ambulatory Visit: Payer: Self-pay | Admitting: Family Medicine

## 2024-12-04 LAB — COMPREHENSIVE METABOLIC PANEL WITH GFR
AG Ratio: 1.7 (calc) (ref 1.0–2.5)
ALT: 34 U/L (ref 9–46)
AST: 16 U/L (ref 10–35)
Albumin: 4.5 g/dL (ref 3.6–5.1)
Alkaline phosphatase (APISO): 133 U/L (ref 35–144)
BUN: 20 mg/dL (ref 7–25)
CO2: 30 mmol/L (ref 20–32)
Calcium: 9.5 mg/dL (ref 8.6–10.3)
Chloride: 101 mmol/L (ref 98–110)
Creat: 1.13 mg/dL (ref 0.70–1.28)
Globulin: 2.6 g/dL (ref 1.9–3.7)
Glucose, Bld: 90 mg/dL (ref 65–99)
Potassium: 4.5 mmol/L (ref 3.5–5.3)
Sodium: 138 mmol/L (ref 135–146)
Total Bilirubin: 0.7 mg/dL (ref 0.2–1.2)
Total Protein: 7.1 g/dL (ref 6.1–8.1)
eGFR: 68 mL/min/1.73m2

## 2024-12-04 LAB — HEMOGLOBIN A1C
Hgb A1c MFr Bld: 5.4 %
Mean Plasma Glucose: 108 mg/dL
eAG (mmol/L): 6 mmol/L

## 2024-12-19 ENCOUNTER — Other Ambulatory Visit: Payer: Self-pay | Admitting: Family Medicine

## 2024-12-19 ENCOUNTER — Ambulatory Visit: Payer: Medicare Other

## 2024-12-19 DIAGNOSIS — I429 Cardiomyopathy, unspecified: Secondary | ICD-10-CM | POA: Diagnosis not present

## 2024-12-20 LAB — CUP PACEART REMOTE DEVICE CHECK
Battery Remaining Longevity: 69 mo
Battery Remaining Percentage: 77 %
Battery Voltage: 2.99 V
Brady Statistic AP VP Percent: 1 %
Brady Statistic AP VS Percent: 64 %
Brady Statistic AS VP Percent: 1 %
Brady Statistic AS VS Percent: 34 %
Brady Statistic RA Percent Paced: 63 %
Brady Statistic RV Percent Paced: 1 %
Date Time Interrogation Session: 20260114070910
HighPow Impedance: 78 Ohm
HighPow Impedance: 78 Ohm
Implantable Lead Connection Status: 753985
Implantable Lead Connection Status: 753985
Implantable Lead Implant Date: 20240115
Implantable Lead Implant Date: 20240115
Implantable Lead Location: 753859
Implantable Lead Location: 753860
Implantable Lead Model: 7122
Implantable Pulse Generator Implant Date: 20240115
Lead Channel Impedance Value: 430 Ohm
Lead Channel Impedance Value: 460 Ohm
Lead Channel Pacing Threshold Amplitude: 0.5 V
Lead Channel Pacing Threshold Amplitude: 0.75 V
Lead Channel Pacing Threshold Pulse Width: 0.5 ms
Lead Channel Pacing Threshold Pulse Width: 0.5 ms
Lead Channel Sensing Intrinsic Amplitude: 4.7 mV
Lead Channel Sensing Intrinsic Amplitude: 9.7 mV
Lead Channel Setting Pacing Amplitude: 2 V
Lead Channel Setting Pacing Amplitude: 2.5 V
Lead Channel Setting Pacing Pulse Width: 0.5 ms
Lead Channel Setting Sensing Sensitivity: 0.5 mV
Pulse Gen Serial Number: 8956372

## 2024-12-21 ENCOUNTER — Ambulatory Visit: Payer: Self-pay | Admitting: Cardiovascular Disease

## 2024-12-26 NOTE — Progress Notes (Signed)
 Remote ICD Transmission

## 2025-03-20 ENCOUNTER — Ambulatory Visit

## 2025-05-03 ENCOUNTER — Ambulatory Visit

## 2025-06-19 ENCOUNTER — Ambulatory Visit

## 2025-09-18 ENCOUNTER — Ambulatory Visit

## 2025-12-18 ENCOUNTER — Ambulatory Visit

## 2026-03-19 ENCOUNTER — Ambulatory Visit
